# Patient Record
Sex: Female | Born: 1935 | Race: Black or African American | Hispanic: No | Smoking: Never smoker
Health system: Southern US, Community
[De-identification: ages and names within clinical notes are randomized; demographics above are authoritative.]

## PROBLEM LIST (undated history)

## (undated) DIAGNOSIS — Z79890 Hormone replacement therapy (postmenopausal): Secondary | ICD-10-CM

## (undated) DIAGNOSIS — I493 Ventricular premature depolarization: Secondary | ICD-10-CM

## (undated) DIAGNOSIS — K2210 Ulcer of esophagus without bleeding: Secondary | ICD-10-CM

## (undated) DIAGNOSIS — F329 Major depressive disorder, single episode, unspecified: Secondary | ICD-10-CM

## (undated) DIAGNOSIS — B029 Zoster without complications: Secondary | ICD-10-CM

## (undated) DIAGNOSIS — L309 Dermatitis, unspecified: Secondary | ICD-10-CM

## (undated) DIAGNOSIS — I10 Essential (primary) hypertension: Secondary | ICD-10-CM

## (undated) DIAGNOSIS — I2510 Atherosclerotic heart disease of native coronary artery without angina pectoris: Secondary | ICD-10-CM

## (undated) DIAGNOSIS — J189 Pneumonia, unspecified organism: Secondary | ICD-10-CM

## (undated) DIAGNOSIS — K5790 Diverticulosis of intestine, part unspecified, without perforation or abscess without bleeding: Secondary | ICD-10-CM

## (undated) DIAGNOSIS — R42 Dizziness and giddiness: Secondary | ICD-10-CM

## (undated) DIAGNOSIS — K922 Gastrointestinal hemorrhage, unspecified: Secondary | ICD-10-CM

## (undated) DIAGNOSIS — R739 Hyperglycemia, unspecified: Secondary | ICD-10-CM

## (undated) DIAGNOSIS — M5430 Sciatica, unspecified side: Secondary | ICD-10-CM

## (undated) DIAGNOSIS — M179 Osteoarthritis of knee, unspecified: Secondary | ICD-10-CM

## (undated) DIAGNOSIS — I272 Pulmonary hypertension, unspecified: Secondary | ICD-10-CM

## (undated) DIAGNOSIS — I509 Heart failure, unspecified: Secondary | ICD-10-CM

## (undated) DIAGNOSIS — M47816 Spondylosis without myelopathy or radiculopathy, lumbar region: Secondary | ICD-10-CM

## (undated) DIAGNOSIS — M1710 Unilateral primary osteoarthritis, unspecified knee: Secondary | ICD-10-CM

## (undated) DIAGNOSIS — E785 Hyperlipidemia, unspecified: Secondary | ICD-10-CM

## (undated) DIAGNOSIS — N183 Chronic kidney disease, stage 3 (moderate): Secondary | ICD-10-CM

## (undated) HISTORY — DX: Gastrointestinal hemorrhage, unspecified: K92.2

## (undated) HISTORY — DX: Hormone replacement therapy: Z79.890

## (undated) HISTORY — DX: Ventricular premature depolarization: I49.3

## (undated) HISTORY — DX: Chronic kidney disease, stage 3 (moderate): N18.3

## (undated) HISTORY — DX: Pulmonary hypertension, unspecified: I27.20

## (undated) HISTORY — DX: Spondylosis without myelopathy or radiculopathy, lumbar region: M47.816

## (undated) HISTORY — PX: BACK SURGERY: SHX140

## (undated) HISTORY — DX: Atherosclerotic heart disease of native coronary artery without angina pectoris: I25.10

## (undated) HISTORY — DX: Ulcer of esophagus without bleeding: K22.10

## (undated) HISTORY — DX: Pneumonia, unspecified organism: J18.9

## (undated) HISTORY — DX: Zoster without complications: B02.9

## (undated) HISTORY — DX: Heart failure, unspecified: I50.9

## (undated) HISTORY — DX: Diverticulosis of intestine, part unspecified, without perforation or abscess without bleeding: K57.90

## (undated) HISTORY — DX: Sciatica, unspecified side: M54.30

## (undated) HISTORY — DX: Dizziness and giddiness: R42

## (undated) HISTORY — PX: TOTAL ABDOMINAL HYSTERECTOMY: SHX209

## (undated) HISTORY — PX: APPENDECTOMY: SHX54

## (undated) HISTORY — DX: Dermatitis, unspecified: L30.9

## (undated) HISTORY — DX: Major depressive disorder, single episode, unspecified: F32.9

## (undated) HISTORY — DX: Hyperlipidemia, unspecified: E78.5

## (undated) HISTORY — DX: Hyperglycemia, unspecified: R73.9

## (undated) HISTORY — DX: Osteoarthritis of knee, unspecified: M17.9

## (undated) HISTORY — DX: Unilateral primary osteoarthritis, unspecified knee: M17.10

## (undated) HISTORY — PX: BREAST LUMPECTOMY: SHX2

---

## 2005-03-18 ENCOUNTER — Encounter: Admission: RE | Admit: 2005-03-18 | Discharge: 2005-03-18 | Payer: Self-pay | Admitting: Internal Medicine

## 2006-03-19 ENCOUNTER — Encounter: Admission: RE | Admit: 2006-03-19 | Discharge: 2006-03-19 | Payer: Self-pay | Admitting: Internal Medicine

## 2007-03-23 ENCOUNTER — Encounter: Admission: RE | Admit: 2007-03-23 | Discharge: 2007-03-23 | Payer: Self-pay | Admitting: Family Medicine

## 2007-08-16 ENCOUNTER — Ambulatory Visit: Payer: Self-pay | Admitting: Internal Medicine

## 2007-08-16 ENCOUNTER — Inpatient Hospital Stay (HOSPITAL_COMMUNITY): Admission: EM | Admit: 2007-08-16 | Discharge: 2007-08-21 | Payer: Self-pay | Admitting: Emergency Medicine

## 2008-03-25 ENCOUNTER — Encounter: Admission: RE | Admit: 2008-03-25 | Discharge: 2008-03-25 | Payer: Self-pay | Admitting: Internal Medicine

## 2008-08-18 ENCOUNTER — Encounter: Admission: RE | Admit: 2008-08-18 | Discharge: 2008-08-18 | Payer: Self-pay | Admitting: Internal Medicine

## 2008-08-24 ENCOUNTER — Encounter: Admission: RE | Admit: 2008-08-24 | Discharge: 2008-08-24 | Payer: Self-pay | Admitting: Internal Medicine

## 2008-10-18 ENCOUNTER — Emergency Department (HOSPITAL_COMMUNITY): Admission: EM | Admit: 2008-10-18 | Discharge: 2008-10-18 | Payer: Self-pay | Admitting: Emergency Medicine

## 2008-10-20 ENCOUNTER — Encounter: Admission: RE | Admit: 2008-10-20 | Discharge: 2008-10-20 | Payer: Self-pay | Admitting: Internal Medicine

## 2008-11-02 ENCOUNTER — Encounter: Admission: RE | Admit: 2008-11-02 | Discharge: 2008-11-02 | Payer: Self-pay | Admitting: Internal Medicine

## 2010-05-04 LAB — COMPREHENSIVE METABOLIC PANEL
AST: 24 U/L (ref 0–37)
Albumin: 3.9 g/dL (ref 3.5–5.2)
Alkaline Phosphatase: 68 U/L (ref 39–117)
CO2: 28 mEq/L (ref 19–32)
GFR calc Af Amer: >60 mL/min (ref >60)
GFR calc non Af Amer: 57 mL/min — ABNORMAL LOW (ref >60)
Potassium: 4.2 mEq/L (ref 3.5–5.1)
Sodium: 137 mEq/L (ref 135–145)
Total Bilirubin: 0.5 mg/dL (ref 0.3–1.2)

## 2010-05-04 LAB — URINALYSIS, ROUTINE W REFLEX MICROSCOPIC
Bilirubin Urine: NEGATIVE
Glucose, UA: NEGATIVE mg/dL
Hgb urine dipstick: NEGATIVE
Ketones, ur: NEGATIVE mg/dL
Nitrite: NEGATIVE
Specific Gravity, Urine: 1.016 (ref 1.005–1.030)
Urobilinogen, UA: 1 mg/dL (ref 0.0–1.0)

## 2010-05-04 LAB — LIPASE, BLOOD: Lipase: 23 U/L (ref 11–59)

## 2010-05-04 LAB — CBC
HCT: 43.7 % (ref 36.0–46.0)
Hemoglobin: 14.9 g/dL (ref 12.0–15.0)
MCV: 94.2 fL (ref 78.0–100.0)
Platelets: 203 10*3/uL (ref 150–400)
WBC: 7.6 10*3/uL (ref 4.0–10.5)

## 2010-05-04 LAB — DIFFERENTIAL
Basophils Absolute: 0.1 10*3/uL (ref 0.0–0.1)
Eosinophils Absolute: 0.1 10*3/uL (ref 0.0–0.7)
Eosinophils Relative: 1 % (ref 0–5)
Monocytes Relative: 7 % (ref 3–12)
Neutro Abs: 5.2 10*3/uL (ref 1.7–7.7)

## 2010-05-21 ENCOUNTER — Inpatient Hospital Stay (HOSPITAL_COMMUNITY)
Admission: EM | Admit: 2010-05-21 | Payer: Self-pay | Source: Other Acute Inpatient Hospital | Admitting: Internal Medicine

## 2010-05-21 ENCOUNTER — Emergency Department (HOSPITAL_COMMUNITY): Payer: Medicare Other

## 2010-05-21 ENCOUNTER — Inpatient Hospital Stay (HOSPITAL_COMMUNITY)
Admission: EM | Admit: 2010-05-21 | Discharge: 2010-05-23 | DRG: 378 | Disposition: A | Discharge status: Home / Self Care | Payer: Medicare Other | Attending: Internal Medicine | Admitting: Internal Medicine

## 2010-05-21 DIAGNOSIS — Z7982 Long term (current) use of aspirin: Secondary | ICD-10-CM

## 2010-05-21 DIAGNOSIS — F329 Major depressive disorder, single episode, unspecified: Secondary | ICD-10-CM | POA: Diagnosis present

## 2010-05-21 DIAGNOSIS — Z79899 Other long term (current) drug therapy: Secondary | ICD-10-CM

## 2010-05-21 DIAGNOSIS — K648 Other hemorrhoids: Secondary | ICD-10-CM | POA: Diagnosis present

## 2010-05-21 DIAGNOSIS — I509 Heart failure, unspecified: Secondary | ICD-10-CM | POA: Diagnosis present

## 2010-05-21 DIAGNOSIS — Z780 Asymptomatic menopausal state: Secondary | ICD-10-CM

## 2010-05-21 DIAGNOSIS — F3289 Other specified depressive episodes: Secondary | ICD-10-CM | POA: Diagnosis present

## 2010-05-21 DIAGNOSIS — K625 Hemorrhage of anus and rectum: Principal | ICD-10-CM | POA: Diagnosis present

## 2010-05-21 DIAGNOSIS — D500 Iron deficiency anemia secondary to blood loss (chronic): Secondary | ICD-10-CM | POA: Diagnosis present

## 2010-05-21 DIAGNOSIS — Z791 Long term (current) use of non-steroidal anti-inflammatories (NSAID): Secondary | ICD-10-CM

## 2010-05-21 DIAGNOSIS — K2210 Ulcer of esophagus without bleeding: Secondary | ICD-10-CM | POA: Diagnosis present

## 2010-05-21 DIAGNOSIS — M1990 Unspecified osteoarthritis, unspecified site: Secondary | ICD-10-CM | POA: Diagnosis present

## 2010-05-21 DIAGNOSIS — K5730 Diverticulosis of large intestine without perforation or abscess without bleeding: Secondary | ICD-10-CM | POA: Diagnosis present

## 2010-05-21 DIAGNOSIS — R42 Dizziness and giddiness: Secondary | ICD-10-CM | POA: Diagnosis present

## 2010-05-21 DIAGNOSIS — E876 Hypokalemia: Secondary | ICD-10-CM | POA: Diagnosis present

## 2010-05-21 DIAGNOSIS — I10 Essential (primary) hypertension: Secondary | ICD-10-CM | POA: Diagnosis present

## 2010-05-21 DIAGNOSIS — I4891 Unspecified atrial fibrillation: Secondary | ICD-10-CM | POA: Diagnosis present

## 2010-05-21 DIAGNOSIS — T39095A Adverse effect of salicylates, initial encounter: Secondary | ICD-10-CM | POA: Diagnosis present

## 2010-05-21 DIAGNOSIS — T3995XA Adverse effect of unspecified nonopioid analgesic, antipyretic and antirheumatic, initial encounter: Secondary | ICD-10-CM | POA: Diagnosis present

## 2010-05-21 LAB — CBC
HCT: 29.7 % — ABNORMAL LOW (ref 36.0–46.0)
MCH: 31.3 pg (ref 26.0–34.0)
MCHC: 33.3 g/dL (ref 30.0–36.0)
MCHC: 33.3 g/dL (ref 30.0–36.0)
Platelets: 172 10*3/uL (ref 150–400)
Platelets: 158 10*3/uL (ref 150–400)
Platelets: 163 10*3/uL (ref 150–400)
Platelets: 141 10*3/uL — ABNORMAL LOW (ref 150–400)
RBC: 3.57 MIL/uL — ABNORMAL LOW (ref 3.87–5.11)
RDW: 12.9 % (ref 11.5–15.5)
RDW: 12.9 % (ref 11.5–15.5)
RDW: 13 % (ref 11.5–15.5)
WBC: 10.4 10*3/uL (ref 4.0–10.5)
WBC: 7.1 10*3/uL (ref 4.0–10.5)
WBC: 5.6 10*3/uL (ref 4.0–10.5)

## 2010-05-21 LAB — BASIC METABOLIC PANEL
CO2: 25 mEq/L (ref 19–32)
Chloride: 108 mEq/L (ref 96–112)
GFR calc Af Amer: 59 mL/min — ABNORMAL LOW (ref >60)
Glucose, Bld: 124 mg/dL — ABNORMAL HIGH (ref 70–99)
Potassium: 3.8 mEq/L (ref 3.5–5.1)
Sodium: 141 mEq/L (ref 135–145)

## 2010-05-21 LAB — POCT CARDIAC MARKERS
CKMB, poc: <1 ng/mL — ABNORMAL LOW (ref 1.0–8.0)
Troponin i, poc: <0.05 ng/mL (ref 0.00–0.09)

## 2010-05-21 LAB — PROTIME-INR
INR: 1.05 (ref 0.00–1.49)
Prothrombin Time: 13.9 seconds (ref 11.6–15.2)

## 2010-05-21 LAB — SAMPLE TO BLOOD BANK

## 2010-05-21 LAB — APTT: aPTT: 47 seconds — ABNORMAL HIGH (ref 24–37)

## 2010-05-21 LAB — DIFFERENTIAL
Eosinophils Absolute: 0.1 10*3/uL (ref 0.0–0.7)
Eosinophils Relative: 1 % (ref 0–5)
Neutrophils Relative %: 71 % (ref 43–77)

## 2010-05-21 LAB — ABO/RH: ABO/RH(D): A NEG

## 2010-05-22 LAB — CBC
Hemoglobin: 9.9 g/dL — ABNORMAL LOW (ref 12.0–15.0)
Hemoglobin: 8.9 g/dL — ABNORMAL LOW (ref 12.0–15.0)
MCH: 32.1 pg (ref 26.0–34.0)
MCHC: 34.1 g/dL (ref 30.0–36.0)
MCV: 94.2 fL (ref 78.0–100.0)
Platelets: 160 10*3/uL (ref 150–400)
RBC: 3.18 MIL/uL — ABNORMAL LOW (ref 3.87–5.11)
RBC: 2.77 MIL/uL — ABNORMAL LOW (ref 3.87–5.11)

## 2010-05-22 LAB — COMPREHENSIVE METABOLIC PANEL
ALT: 9 U/L (ref 0–35)
AST: 17 U/L (ref 0–37)
Albumin: 2.8 g/dL — ABNORMAL LOW (ref 3.5–5.2)
CO2: 24 mEq/L (ref 19–32)
Calcium: 8 mg/dL — ABNORMAL LOW (ref 8.4–10.5)
Chloride: 112 mEq/L (ref 96–112)
GFR calc Af Amer: >60 mL/min (ref >60)
GFR calc non Af Amer: >60 mL/min (ref >60)
Sodium: 142 mEq/L (ref 135–145)
Total Bilirubin: 0.5 mg/dL (ref 0.3–1.2)

## 2010-05-23 LAB — CBC
HCT: 31.7 % — ABNORMAL LOW (ref 36.0–46.0)
MCHC: 33.1 g/dL (ref 30.0–36.0)
MCV: 94.9 fL (ref 78.0–100.0)
Platelets: 190 10*3/uL (ref 150–400)
RDW: 13 % (ref 11.5–15.5)
WBC: 7.5 10*3/uL (ref 4.0–10.5)

## 2010-05-23 NOTE — Discharge Summary (Signed)
[name], [name]            ACCOUNT NO.:  [id number]  MEDICAL RECORD NO.:  [id number]           PATIENT TYPE:  I  LOCATION:  1426                         FACILITY:  [facility]  PHYSICIAN:  Candyce Churn, M.D.DATE OF BIRTH:  1916/03/31  DATE OF ADMISSION:  05/21/2010 DATE OF DISCHARGE:  05/23/2010                              DISCHARGE SUMMARY   DISCHARGE DIAGNOSES: 1. Gastrointestinal bleed, likely lower gastrointestinal bleed, at     this time has resolved, felt to likely be either diverticular or     from internal hemorrhoids. 2. Upper endoscopy performed by Dr. Bernette Redbird on May 21, 2010,     reveals 3 small superficial antral ulcers consistent with aspirin     or non-steroidal anti-inflammatory drugs exposure.  There was a     small 4-cm hiatal hernia.  Not felt to be a source of     gastrointestinal bleed. 3. History of congestive heart failure, controlled. 4. History depression, controlled. 5. History of hypertension, controlled. 6. History of atrial fibrillation in the past. 7. Recurrent dizziness. 8. Hypokalemia.  ALLERGIES: 1. ACE INHIBITORS. 2. STREPTOMYCIN. 3. PENICILLIN.  CONSULTATIONS:  Gastroenterology, Bernette Redbird, M.D.  PROCEDURES:  Upper endoscopy performed on May 21, 2010, for hematochezia and results are as above.  DISCHARGE MEDICATIONS: 1. Tylenol 325 mg 2 every 6 hours as needed for joint pain. 2. Pantoprazole 40 mg daily for 15 days. 3. Tramadol 50 mg 1/2 tablet every 8 hours as needed for pain     unrelieved by Tylenol. 4. Meclizine 25 mg 1/2 tablet as needed for dizziness. 5. Citalopram 10 mg daily. 6. Diovan 80 mg daily. 7. Furosemide 40 mg daily and resume when small amount of ankle edema     is starting to reoccur. 8. Imdur 30 mg daily. 9. Loperamide 2 mg as needed for loose stools. 10.Potassium chloride 20 mEq daily. 11.Premarin 0.3 mg 1 tablet daily. 12.Prenatal vitamin daily. 13.Preparation H suppository one  rectally for 7 days.  DISCHARGE LABORATORY DATA:  White count 7500, hemoglobin 10.5, platelet count 190,000 on May 23, 2010.  Hemoglobin on May 22, 2010, was 8.9 and on May 21, 2010 was 9.67.  TSH on May 21, 2010, was 1.835 and comprehensive metabolic profile on May 22, 2010 reveals sodium 142, potassium 3.2, chloride 112, bicarb 24, glucose 84, BUN 10, creatinine 0.79.  LFTs within normal limits except for albumin slightly low at 2.8.  Chest x-ray performed on May 21, 2010, revealed minimal bibasilar atelectasis but otherwise benign.  HOSPITAL COURSE:  Ms. [name] is a very pleasant 77 year old female who developed hematochezia on the day of admission.  She had a sudden urgency to defecate while trying to get to the bathroom and had a large amount of blood rectally.  The bleeding lasted for about 20 minutes.  She was brought to the [facility] Emergency Room and there was no evidence of active bleeding there, but the stool was guaiac positive. She was admitted for observation.  Hemoglobin dropped to as low as 8.9 on the morning of May 22, 2010, but by May 23, 2010, hemoglobin was up to 10.5.  She  was seen in consultation by Dr. Bernette Redbird who performed an upper endoscopy and there were 2 small gastric erosions noted and a hiatal hernia and it was felt that it was probably lower GI bleed, either diverticular or hemorrhoidal.  On the day of discharge, the patient was no longer having bloody stools and hemoglobin was rising and vital signs were stable.  It was decided that she should be discharged home off aspirin therapy and off NSAIDs and to Tylenol or Ultram for her DJD.  I will plan to see her back in the office in 1 week for followup and for possible internal hemorrhoidal bleeding she will use preparation suppositories for 1 week.  Any recurrent bleeding, the patient should notify us immediately.  CONDITION ON DISCHARGE:  Much improved.  She will  be discharged home in the company of her family.  She is eating well and ambulating well.  Time spent on this discharge summary with examining the patient, reviewing the chart, performing discharge summary was 35 minutes.     Candyce Churn, M.D.     RNG/MEDQ  D:  05/23/2010  T:  05/23/2010  Job:  [id number]  Electronically Signed by Marden Noble M.D. on 05/23/2010 09:00:52 PM

## 2010-05-25 LAB — CROSSMATCH
Antibody Screen: NEGATIVE
Unit division: 0

## 2010-06-03 NOTE — H&P (Signed)
NAMESTEPHAINE, [name]            ACCOUNT NO.:  [id number]  MEDICAL RECORD NO.:  [id number]           PATIENT TYPE:  I  LOCATION:  1426                         FACILITY:  [facility]  PHYSICIAN:  Lonia Blood, M.D.      DATE OF BIRTH:  1916/07/31  DATE OF ADMISSION:  05/21/2010 DATE OF DISCHARGE:                             HISTORY & PHYSICAL   PRIMARY CARE PHYSICIAN:  Candyce Churn, M.D.  PRESENTING COMPLAINT:  Rectal bleed.  HISTORY OF PRESENT ILLNESS:  The patient is a 77 year old female with a history of atrial fibrillation and CHF who was relatively doing fine and remarkably well for her age.  She was apparently moving from her chair to the bathroom when she felt like she needed to really go and have a bowel movement.  Before she could get to the bathroom, she had what she described as a massive GI bleed.  She had blood oozing out from her rectum and she was unable to even walk.  She had to use a towel, which seemed to be soaked with blood.  This lasted for about 20 minutes, it was on and off.  She was dizzy and weak.  She finally was able to crawl to the telephone, call her nephew, and later call EMS.  EMS transported her to the emergency room.  On arrival, there was no evidence of active bleeding, but her stool guaiac was positive.  She is stable now.  She denied any abdominal pain.  No nausea or vomiting.  She has been taking 2 tablets of Aleve every day for the last 1 month.  She is also on aspirin.  Other than that, the patient has relatively been doing fine. No shortness of breath.  She is currently not dizzy.  She did not pass out.  PAST MEDICAL HISTORY: 1. CHF. 2. Depression. 3. Hypertension. 4. Atrial fibrillation. 5. Recurrent dizziness. 6. Hypokalemia.  ALLERGIES: 1. ACE INHIBITORS. 2. STREPTOMYCIN.  MEDICATIONS:  Aleve 2 tablets daily, aspirin, citalopram, isosorbide mononitrate, Lasix, loperamide, meclizine, metoprolol, potassium chloride, Premarin,  and prenatal vitamins.  SOCIAL HISTORY:  The patient lives in [place] at home.  She has some help, but she lives alone and she is doing fine with her ADLs.  No tobacco, alcohol, or IV drug use.  FAMILY HISTORY:  Noncontributory.  REVIEW OF SYSTEMS:  All systems reviewed are negative except per HPI.  PHYSICAL EXAMINATION:  VITAL SIGNS:  Temperature 97.5, blood pressure 137/78, pulse 55, respiratory rate 18, sats 95% on room air. GENERAL:  She is awake, alert, and oriented x3.  She is in no acute distress. HEENT:  PERL.  EOMI.  No pallor, no jaundice, no rhinorrhea. NECK:  Supple.  No JVD, no lymphadenopathy. RESPIRATORY:  She has good air entry bilaterally.  No wheezes, no rales, no crackles. CARDIOVASCULAR SYSTEM:  She has S1, S2.  No audible murmur. ABDOMEN:  Soft full, nontender with positive bowel sounds. EXTREMITIES:  No edema, cyanosis, or clubbing. SKIN:  No rashes, no ulcers.  LABORATORY DATA:  White count is 10.4, hemoglobin 11.2 with platelet count of 172 and normal differential.  Sodium 114, potassium 3.8,  chloride 108, CO2 is 25, glucose 124, BUN 20, creatinine 1.06, calcium 9.1.  Her GFR is, however, 49.  Fecal occult blood testing is positive.  Chest x-ray showed mildly hypoexpanded lungs with minimal left basilar atelectasis.  ASSESSMENT:  This is a 76 year old female presenting with rectal bleed. The rectal bleed was painless.  No prior history of GI bleed, but she is taking Aleve.  Her BUN is mildly elevated at 20, which may reflect some level of upper GI bleed, but the way she describes, it sounds more like a lower GI bleed, being that it was massive bleed.  PLAN: 1. GI bleed.  We will admit the patient to a monitored bed, serial     CBCs, IV Protonix.  I will put her on clear liquids.  I have gotten     a GI consult who are ready to see the patient in the morning.  We     will type and cross match her for 2 units of packed red blood cells     on hold if  her hemoglobin drops. 2. Hypertension.  I will hold her antihypertensives in view of  recent     GI bleed. 3. Depression.  We will continue her medication as soon as possible. 4. History of CHF.  Her EF is unknown at this point and I will defer     her care to her primary care physician. 5. History of atrial fibrillation.  She seems to be in sinus rhythm at     this point. 6. Anemia, mild anemia and normocytic, but this could be anemia of     blood loss.  Her hemoglobin is expected to probably drop in view of     the fact that she just had a GI bleed.     Lonia Blood, M.D.     Verlin Grills  D:  05/21/2010  T:  05/21/2010  Job:  [id number]  Electronically Signed by Lonia Blood M.D. on 06/03/2010 10:06:38 PM

## 2010-06-08 NOTE — Consult Note (Signed)
NAMEGRACIOUS, [name]            ACCOUNT NO.:  [id number]  MEDICAL RECORD NO.:  [id number]           PATIENT TYPE:  I  LOCATION:  1426                         [facility]ACILITY:  [facility]  PHYSICIAN:  Bernette Redbird, M.D.   DATE OF BIRTH:  1916/08/27  DATE OF CONSULTATION:  05/21/2010 DATE OF DISCHARGE:                                CONSULTATION   REQUESTING PHYSICIAN:  Lonia Blood, M.D.  HISTORY:  Dr. Lonia Blood of the [facility] asked [place] to see this 77 year old female because of hematochezia.  The patient has no prior history of GI bleeding.  She did have colonoscopy by Dr. Dorena Cookey about a year-and-a-half ago which showed moderately severe sigmoid and left-sided diverticulosis, but no polyps, inflammation, masses, or vascular ectasia.  Diverticula were also confirmed on an abdominopelvic CT scan around that time.  The patient is on daily aspirin and also has been using moderate amounts of nonsteroidal anti-inflammatory drugs.  With that background, the patient developed painless but profuse persistent hematochezia yesterday evening to the point of presyncope. She was brought to the hospital and admitted.  Since arriving here, she has had no further hematochezia.  Her hemoglobin has drifted down from 11.2 to 10.0.  For comparison, her hemoglobin 6 months ago was 13.9, as an outpatient.  PAST MEDICAL HISTORY:  Allergies to ACE INHIBITORS and STREPTOMYCIN.  OUTPATIENT MEDICATIONS:  Aleve 2 tablets daily, daily aspirin, citalopram, isosorbide mononitrate, Lasix, loperamide, meclizine, metoprolol, potassium chloride, Premarin and vitamins.  OPERATIONS:  None that I am aware of.  CHRONIC MEDICAL ILLNESSES:  Atrial fibrillation, CHF, hypertension, hypokalemia, depression.  HABITS:  Nonsmoker, nondrinker.  FAMILY HISTORY:  Not obtained, not felt relevant.  SOCIAL HISTORY:  The patient is independent and lives at home.  She is a retired [employer] information [employer] manager.  REVIEW  OF SYSTEMS:  Occasional diarrhea but otherwise no ongoing GI tract symptoms.  She does admit to significant fatigue or lack of energy at baseline.  PHYSICAL EXAMINATION:  GENERAL:  A very pleasant and remarkably well preserved female, in no acute distress.  Anicteric.  No frank pallor. SKIN:  Warm. CHEST:  Clear. HEART:  Slightly irregular rhythm, no murmurs. ABDOMEN:  Minimally adipose, active bowel sounds, no tenderness.  LABORATORY DATA:  White count 7100.  Hemoglobin on admission was 11.2 last night, this morning 10.0.  Platelets 158,000.  Differential count unremarkable.  INR 1.0, BUN 20 with creatinine 1.1.  Basic chemistry panel otherwise unremarkable.  Troponins negative.  Occult blood positive as expected.  TSH normal.  IMPRESSION:  Clinically, this is most compatible with moderately severe but self limited diverticular bleed, with associated posthemorrhagic anemia  of moderate severity.  PLAN:  I discussed various options with the patient including observation, endoscopic evaluation to take an upper tract source of the equation or colonoscopy.  In general, the diagnostic and therapeutic yield of colonoscopy in a patient like this with known diverticular disease and a fairly recent colonoscopy having been negative for polyps, would be quite low.  Therefore, I feel that the best course of action is to do an upper endoscopy to confirm the absence of an upper tract  source, since the patient does note that the stool was rather dark in color, since she was presyncopal, and since she has a history of aspirin and nonsteroidal medication exposure and is an elderly female which is the demographic category most likely to develop aspirin induced gastropathy and bleeding.  The nature, purpose, and risks of endoscopy were discussed with the patient and she is agreeable.  If the exam is negative, I would then favor observation and medical support as needed, for example, a transfusion if  her hemoglobin gets below 8.  I appreciate the opportunity to have seen this patient in consultation with you.          ______________________________ Bernette Redbird, M.D.     RB/MEDQ  D:  05/21/2010  T:  05/21/2010  Job:  [id number]  cc:   Candyce Churn, M.D. Fax: [phone]  Electronically Signed by Bernette Redbird M.D. on 06/08/2010 10:07:51 AM

## 2010-06-12 NOTE — Discharge Summary (Signed)
NAMEJENNETTA, [name]            ACCOUNT NO.:  [id number]   MEDICAL RECORD NO.:  [id number]          PATIENT TYPE:  INP   LOCATION:  4714                         FACILITY:  [facility]   PHYSICIAN:  Theressa Millard, M.D.    DATE OF BIRTH:  Nov 29, 1916   DATE OF ADMISSION:  08/15/2007  DATE OF DISCHARGE:  08/21/2007                               DISCHARGE SUMMARY   ADMITTING DIAGNOSIS:  Probable pneumonia.   DISCHARGE DIAGNOSES:  1. Pneumonia.  2. Volume overload, no evidence of congestive heart failure.  3. Hypertension.  4. Hyperlipidemia.  5. Coronary artery disease.  6. Hyperglycemia, resolved.   The patient is an 77 year old white female who presented to the  emergency room with a 3-4 day history of chest discomfort, general  malaise and fatigue, and a cough productive of yellow sputum.  She had  been exposed to her friend who had been sick with similar illness.   HOSPITAL COURSE:  The patient was admitted and was placed on Novel H1N1  on droplet precautions.  She was started on azithromycin and Rocephin.  Over the subsequent 24 hours, she stabilized and began to feel somewhat  better.  However, she continued to have cough that was usually dry  although occasionally productive.  A followup chest x-ray was consistent  with volume overload, and a BNP was mildly elevated.  I reviewed her old  chart, and she has no evidence of LV dysfunction.  It was thought that  the patient was probably suffering from mild volume overload, and she  was treated with furosemide intravenously on 2 occasions, and she felt  much better with resolution in her oxygen need as well as improvement in  her cough.  This was coincident with the treatment of her pneumonia and  the treatment of her developing volume overload.  She continued to  improve, was able to ambulate in the room without problem.  Her Novel  H1N1 test came back negative, and we allowed her to ambulate in the hall  which she could do easily.   She was changed to oral antibiotics, and she  was discharged in improved condition.   She did have mild hyperglycemia at admission as well as a transient  hypokalemia, both of which resolved at the time of discharge.   DISCHARGE MEDICATIONS:  1. Xanax 0.25 mg t.i.d. p.r.n.  2. Aspirin 81 mg daily.  3. Citalopram 10 mg daily.  4. Klonopin 0.125 mg at bedtime.  5. Diovan 40 mg daily.  6. Hydrochlorothiazide 25 mg daily.  7. Imdur 30 mg daily.  8. Potassium 20 mEq daily.  9. Pravastatin 20 mg daily.  10.Premarin 0.3 mg daily.  11.Multivitamin daily.  12.Aleve b.i.d. p.r.n.  13.Loperamide 2 mg p.r.n.  14.Meclizine 25 mg p.r.n.  15.Tussionex 1 teaspoon b.i.d. p.r.n.   ACTIVITY:  As tolerated.   DIET:  No-added salt.   FOLLOWUP:  She will call to make an appointment to see Dr. Kevan Ny in  approximately 3-4 weeks.  She will need a followup chest x-ray at that  time to be sure her mild pneumonic infiltrate has resolved.  Theressa Millard, M.D.  Electronically Signed     JO/MEDQ  D:  08/21/2007  T:  08/21/2007  Job:  [id number]

## 2010-06-12 NOTE — H&P (Signed)
[name], [name]            ACCOUNT NO.:  [id number]   MEDICAL RECORD NO.:  [id number]         PATIENT TYPE:  CINP   LOCATION:                                FACILITY:  [facility]   PHYSICIAN:  Donalynn Furlong, MD      DATE OF BIRTH:  1916-11-17   DATE OF ADMISSION:  08/16/2007  DATE OF DISCHARGE:                              HISTORY & PHYSICAL   PRIMARY CARE Irina Okelly:  [name], MD.   CHIEF COMPLAINT:  Shortness of breath, cough with productive sputum,  chest congestion, fever, chills and malaise.   HISTORY OF PRESENTING ILLNESS:  [name] is an 77 year old  female who lives in [place] by herself.  She presented to ER tonight  with a complaint of shortness of breath, chest pain with cough  production and sputum production since last Thursday.  She has the  symptoms for 3 days now.  She has associated chest pain which is sharp  in nature, increased with cough, deep breath and movement of chest.  Chest pain is located over the lower sternal area without any radiation.  She denied any history of heart attack or bypass surgery in the past.  She is followed by [facility].  She mentioned that she was  recently exposed to her friend who had been sick for 7-8 days and with  similar respiratory symptoms.  She mentioned that she started having  cough with mild productive yellow sputum on Thursday.  She was getting  short of breath on exertion, also associated with chest pain as above.  She also mentioned she was feeling hot and cold, she never checked her  temperature.  She felt hot all over the body which was not associated  with the chills or sweating.  She came to ER because progressively she  was getting weak and unable to do her daily activities.  She denied any  history of gallbladder problem, clots in the lungs, lower extremities,  history of pneumonia in the past.   PAST MEDICAL HISTORY:  1. Hypertension.  2. Hyperlipidemia.  3. Coronary artery disease.  4. Appendectomy.  5. Hysterectomy.   ALLERGIES:  1. ACE INHIBITORS.  2. STREPTOMYCIN.   HOME MEDICATIONS:  Aleve, alprazolam, aspirin, azithromycin, citalopram,  clonazepam, Diovan, hydrochlorothiazide, isosorbide mononitrate,  loperamide, meclizine Naproxen, potassium chloride, pravastatin,  Premarin, prenatal vitamin and Tussionex.   SOCIAL HISTORY:  She lives in [place] by herself, occasional cocktail  drinker.  No recent smoking or drug use.   FAMILY HISTORY:  Nothing contributory to the current problem.   REVIEW OF SYSTEMS:  Positive per HPI, otherwise negative review of  systems done for 14 systems.   PHYSICAL EXAM:  VITAL SIGNS:  Blood pressure 146/71, pulse 71,  respirations 18, temperature 99.1, oxygen saturation 98% on room air.  GENERAL EXAM:  Alert, oriented x3, laying in bed.  Appears stated age.  CARDIOVASCULAR:  S1, S2 regular.  No murmur, rub, gallop.  LUNGS:  Bilateral lower lobe crackles posteriorly along with wheezing  all over the lungs, good bilateral air entry.  ABDOMEN:  Previous scars noted.  Mild tender  all over the abdomen,  nondistended.  Bowel sounds present.  No organomegaly.  EXTREMITIES:  No clubbing, cyanosis, edema.  Pulse peripheral in all  four extremities.  HEAD:  Normocephalic, nontraumatic.  EYES:  Pupils equally reactive to light and accommodation.  Extraocular  muscles intact.  ORAL CAVITY:  Oral mucosa dry, no thrush noted.  NECK:  No thyromegaly or JVD.  NEUROLOGICAL EXAM:  Shows intact cranial nerves, muscular strength,  sensation.  SKIN:  No rash, no bruits.   LAB:  Shows CBC with differential normal.  First set of troponins  negative.  Comprehensive metabolic panel unremarkable except sodium 134,  glucose 101 and calcium ionized 1.08.  Chest x-ray shows increased  interstitial markings and patchy density at the lung bases, suggestive  of bronchitis and patchy basilar pneumonia.  EKG shows normal sinus  rhythm with left axis  deviation, no acute ST-T changes.   ASSESSMENT AND PLAN:  1. Shortness of breath, cough, chest congestion, chest pain, fever,      chills, malaise with a chest infiltrate on the chest x-ray      suggestive of community-acquired pneumonia, influenza cannot be      excluded, coronary artery disease cannot be excluded.  2. History of hypertension.  3. History of hyperlipidemia.  4. History of occasional alcohol use.   PLAN:  Will admit patient to telemetry bed, under Dr. [name],  with a diagnosis of chest pain, pneumonia.  Will start her on cardiac  diet.  We will check her vitals q.4h. along with input, output.  Will  get CBC, CMP, magnesium phosphate, EKG, chest x-ray in the morning.  Will get cardiac enzymes with troponins at 2:00 a.m. and 8:00 a.m.  today.  Will start breathing treatment with albuterol/ipratropium q.6h.  and p.r.n.  We will check H1N1 virus, PCR surveillance test and start  appropriate isolation now.  Patient has already had symptoms for 3 days  so she does not qualify for the treatment for Tamiflu at this time.  Will start IV Rocephin 1 gram and IV azithromycin 500 mg every 24 hours.  Will put SCDs on the leg.  Will give her aspirin 81 mg, Xanax,  citalopram, clonazepam, Diovan, isosorbide mononitrate, pravastatin and  Premarin at home dose.      Donalynn Furlong, MD  Electronically Signed     TVP/MEDQ  D:  08/16/2007  T:  08/16/2007  Job:  [id number]   cc:   Candyce Churn, M.D.

## 2010-10-26 LAB — DIFFERENTIAL
Basophils Absolute: 0
Basophils Relative: 0
Lymphocytes Relative: 14
Monocytes Absolute: 0.6
Neutro Abs: 7.8 — ABNORMAL HIGH
Neutrophils Relative %: 79 — ABNORMAL HIGH

## 2010-10-26 LAB — PHOSPHORUS: Phosphorus: 2.2 — ABNORMAL LOW

## 2010-10-26 LAB — CBC
HCT: 34.5 — ABNORMAL LOW
HCT: 35.2 — ABNORMAL LOW
HCT: 35.3 — ABNORMAL LOW
Hemoglobin: 11.8 — ABNORMAL LOW
Hemoglobin: 11.9 — ABNORMAL LOW
Hemoglobin: 12.4
Hemoglobin: 12.8
MCHC: 34.1
MCV: 91.9
Platelets: 169
Platelets: 186
RBC: 3.78 — ABNORMAL LOW
RDW: 14.1
RDW: 14.2
RDW: 14.3
WBC: 15.1 — ABNORMAL HIGH

## 2010-10-26 LAB — BASIC METABOLIC PANEL
BUN: 11
BUN: 9
CO2: 24
CO2: 28
CO2: 28
CO2: 29
Calcium: 8.6
Calcium: 9
Calcium: 9
Chloride: 101
Chloride: 96
Chloride: 97
GFR calc Af Amer: >60
GFR calc Af Amer: >60
GFR calc Af Amer: >60
GFR calc non Af Amer: >60
GFR calc non Af Amer: >60
Glucose, Bld: 112 — ABNORMAL HIGH
Glucose, Bld: 115 — ABNORMAL HIGH
Glucose, Bld: 87
Glucose, Bld: 96
Potassium: 3.4 — ABNORMAL LOW
Potassium: 3.5
Sodium: 134 — ABNORMAL LOW
Sodium: 136
Sodium: 139

## 2010-10-26 LAB — POCT CARDIAC MARKERS
CKMB, poc: 1.3
Myoglobin, poc: 65.1
Operator id: 133351
Troponin i, poc: <0.05

## 2010-10-26 LAB — POCT I-STAT, CHEM 8
Calcium, Ion: 1.08 — ABNORMAL LOW
Chloride: 101
Glucose, Bld: 101 — ABNORMAL HIGH
HCT: 40
TCO2: 26

## 2010-10-26 LAB — CULTURE, BLOOD (ROUTINE X 2): Culture: NO GROWTH

## 2010-10-26 LAB — TROPONIN I: Troponin I: 0.05

## 2010-10-26 LAB — URINE CULTURE
Colony Count: NO GROWTH
Culture: NO GROWTH

## 2010-10-26 LAB — CARDIAC PANEL(CRET KIN+CKTOT+MB+TROPI)
CK, MB: 3.5
Total CK: 143

## 2010-10-26 LAB — COMPREHENSIVE METABOLIC PANEL
Alkaline Phosphatase: 64
BUN: 7
Chloride: 99
Creatinine, Ser: 0.96
Glucose, Bld: 220 — ABNORMAL HIGH
Potassium: 3.7
Total Bilirubin: 0.6
Total Protein: 6.4

## 2010-10-26 LAB — CULTURE, RESPIRATORY W GRAM STAIN

## 2010-10-26 LAB — URINALYSIS, ROUTINE W REFLEX MICROSCOPIC
Leukocytes, UA: NEGATIVE
Nitrite: NEGATIVE
Protein, ur: 30 — AB
Specific Gravity, Urine: 1.015
Urobilinogen, UA: 0.2

## 2010-10-26 LAB — URINE MICROSCOPIC-ADD ON

## 2010-10-26 LAB — CK TOTAL AND CKMB (NOT AT ARMC): Total CK: 128

## 2010-10-26 LAB — EXPECTORATED SPUTUM ASSESSMENT W GRAM STAIN, RFLX TO RESP C

## 2010-10-26 LAB — MAGNESIUM: Magnesium: 1.5

## 2010-12-04 ENCOUNTER — Other Ambulatory Visit: Payer: Self-pay | Admitting: Internal Medicine

## 2010-12-04 ENCOUNTER — Ambulatory Visit
Admission: RE | Admit: 2010-12-04 | Discharge: 2010-12-04 | Disposition: A | Discharge status: Home / Self Care | Payer: Medicare Other | Source: Ambulatory Visit | Attending: Internal Medicine | Admitting: Internal Medicine

## 2010-12-04 DIAGNOSIS — R0601 Orthopnea: Secondary | ICD-10-CM

## 2010-12-04 DIAGNOSIS — R05 Cough: Secondary | ICD-10-CM

## 2010-12-25 ENCOUNTER — Encounter: Payer: Self-pay | Admitting: Pulmonary Disease

## 2010-12-26 ENCOUNTER — Encounter: Payer: Self-pay | Admitting: Pulmonary Disease

## 2010-12-26 ENCOUNTER — Ambulatory Visit (INDEPENDENT_AMBULATORY_CARE_PROVIDER_SITE_OTHER): Payer: Medicare Other | Admitting: Pulmonary Disease

## 2010-12-26 VITALS — BP 122/68 | HR 65 | Temp 98.0°F | Ht 64.0 in | Wt 156.8 lb

## 2010-12-26 DIAGNOSIS — R05 Cough: Secondary | ICD-10-CM

## 2010-12-26 DIAGNOSIS — R053 Chronic cough: Secondary | ICD-10-CM

## 2010-12-26 NOTE — Patient Instructions (Addendum)
Stop fexofenadine, and get chlorpheniramine 8mg  over the counter and take each night at bedtime. Take your acid reflux medication in the am, then take nexium samples before dinner everyday Minimize voice use, and no throat clearing Use hard candy, no mint or menthol, to soothe your throat during the day. Please call us in 3 weeks to give [place] update on your condition.

## 2010-12-26 NOTE — Progress Notes (Signed)
  Subjective:    Patient ID: [name], female    DOB: [birth year], 77 y.o.   MRN: [id number]  HPI The patient is a 77 year old female who I've been asked to see for a chronic cough.  Patient states she was in her usual state of health until September of this year when she began to develop a cough.  Associated with this she had chest congestion as well as moderate quantity of clear mucus.  She was seen by her primary physician and felt to have an acute bronchitis, and was treated with antibiotics and prednisone.  She did feel better, however her cough continued to be an issue.  She has been treated with Allegra and a nasal spray for possible allergy symptoms, as well his Advair for possible asthma with no improvement.  Her cough has been dry after the initial episode, and the patient feels this is coming from her throat.  She denies chronic throat clearing, but has had voice changes.  She can lose her voice at times.  She denies any postnasal drip, sinus pressure, or sinus congestion.  She does have a history of reflux for which she is on once a day proton pump inhibitor, but has breakthrough symptoms despite this at times.  She has no history of asthma or other lung disease, and a recent chest x-ray was unremarkable.  She does state that her cough is worse with eating and drinking, but does not feel that she aspirates.   Review of Systems  Constitutional: Negative for fever and unexpected weight change.  HENT: Positive for congestion, sore throat, sneezing and trouble swallowing. Negative for ear pain, nosebleeds, rhinorrhea, dental problem, postnasal drip and sinus pressure.   Eyes: Negative for redness and itching.  Respiratory: Positive for cough and shortness of breath. Negative for chest tightness and wheezing.   Cardiovascular: Positive for chest pain and palpitations. Negative for leg swelling.  Gastrointestinal: Negative for nausea and vomiting.  Genitourinary: Negative for dysuria.    Musculoskeletal: Positive for joint swelling.  Skin: Negative for rash.  Neurological: Negative for headaches.  Hematological: Does not bruise/bleed easily.  Psychiatric/Behavioral: Negative for dysphoric mood. The patient is not nervous/anxious.        Objective:   Physical Exam Constitutional:  Well developed, no acute distress  HENT:  Nares patent without discharge, mildly erythematous mucosa  Oropharynx without exudate, palate and uvula are normal  Eyes:  Perrla, eomi, no scleral icterus  Neck:  No JVD, no TMG  Cardiovascular:  Normal rate, regular rhythm, no rubs or gallops.  No murmurs        Intact distal pulses  Pulmonary :  Normal breath sounds, no stridor or respiratory distress   No rales, rhonchi, or wheezing  Abdominal:  Soft, nondistended, bowel sounds present.  No tenderness noted.   Musculoskeletal:  No lower extremity edema noted.  Lymph Nodes:  No cervical lymphadenopathy noted  Skin:  No cyanosis noted  Neurologic:  Alert, appropriate, moves all 4 extremities without obvious deficit.         Assessment & Plan:

## 2010-12-26 NOTE — Assessment & Plan Note (Signed)
The patient has a persistent cough since September of this year, after what sounds like an episode of acute bronchitis.  From her description, her cough is now dry and most likely coming from her upper airway.  Possible etiologies include postnasal drip, laryngopharyngeal reflux, and a cyclical cough mechanism.  Given her worsening cough with eating and drinking, as well as breakthrough reflux symptoms at times, I would like to treat her more aggressively for possible laryngopharyngeal reflux.  I would also like to empirically treat her with a potent antihistamine at bedtime.  I have also discussed with her the various behavioral treatments for upper airway cough, including voice rest, no throat clearing, and hard candy to soothe the upper airway.

## 2011-01-17 ENCOUNTER — Telehealth: Payer: Self-pay | Admitting: Pulmonary Disease

## 2011-01-17 NOTE — Telephone Encounter (Signed)
LMOM for pt TCB 

## 2011-01-17 NOTE — Telephone Encounter (Signed)
Pt called back.  Stated she was calling to give update per her last OV with KC on 12/26/10.  Pt states her cough is 90% improved!!! She is continuing to take Chlorpheniramine 8mg  qhs.  Pt states she is no longer 'spitting up mucus.'  Pt has ran out of the Nexium samples but has continued to take her Protonix qam.  Pt states she continues to rest her voice as much as she can, suck on hard candy and not clear her throat. Pt aware KC out of office this PM and is ok to wait for a response when [facility] returns to office tomorrow.  KC, please advise.  Thanks.

## 2011-01-18 NOTE — Telephone Encounter (Signed)
Pt aware of KC instructions and verbalized directions and understanding.

## 2011-01-18 NOTE — Telephone Encounter (Signed)
Let her know to stay on the chlorpheniramine at bedtime for 2 more weeks, then can take as needed. Also, continue her protonix, but take over the counter prilosec 20mg  before dinner for just another 2 weeks, then go back to protonix alone.  followup with me as needed.,

## 2011-02-28 DIAGNOSIS — Z0000 Encounter for general adult medical examination without abnormal findings: Secondary | ICD-10-CM | POA: Diagnosis not present

## 2011-02-28 DIAGNOSIS — F411 Generalized anxiety disorder: Secondary | ICD-10-CM | POA: Diagnosis not present

## 2011-02-28 DIAGNOSIS — E7800 Pure hypercholesterolemia, unspecified: Secondary | ICD-10-CM | POA: Diagnosis not present

## 2011-02-28 DIAGNOSIS — I2510 Atherosclerotic heart disease of native coronary artery without angina pectoris: Secondary | ICD-10-CM | POA: Diagnosis not present

## 2011-02-28 DIAGNOSIS — I10 Essential (primary) hypertension: Secondary | ICD-10-CM | POA: Diagnosis not present

## 2011-02-28 DIAGNOSIS — IMO0002 Reserved for concepts with insufficient information to code with codable children: Secondary | ICD-10-CM | POA: Diagnosis not present

## 2011-02-28 DIAGNOSIS — Z79899 Other long term (current) drug therapy: Secondary | ICD-10-CM | POA: Diagnosis not present

## 2011-02-28 DIAGNOSIS — R197 Diarrhea, unspecified: Secondary | ICD-10-CM | POA: Diagnosis not present

## 2011-03-06 DIAGNOSIS — N390 Urinary tract infection, site not specified: Secondary | ICD-10-CM | POA: Diagnosis not present

## 2011-03-06 DIAGNOSIS — L272 Dermatitis due to ingested food: Secondary | ICD-10-CM | POA: Diagnosis not present

## 2011-03-25 DIAGNOSIS — L272 Dermatitis due to ingested food: Secondary | ICD-10-CM | POA: Diagnosis not present

## 2011-03-25 DIAGNOSIS — R351 Nocturia: Secondary | ICD-10-CM | POA: Diagnosis not present

## 2011-03-25 DIAGNOSIS — N390 Urinary tract infection, site not specified: Secondary | ICD-10-CM | POA: Diagnosis not present

## 2011-04-01 DIAGNOSIS — H2640 Unspecified secondary cataract: Secondary | ICD-10-CM | POA: Diagnosis not present

## 2011-04-01 DIAGNOSIS — H524 Presbyopia: Secondary | ICD-10-CM | POA: Diagnosis not present

## 2011-04-01 DIAGNOSIS — H5200 Hypermetropia, unspecified eye: Secondary | ICD-10-CM | POA: Diagnosis not present

## 2011-04-01 DIAGNOSIS — H52229 Regular astigmatism, unspecified eye: Secondary | ICD-10-CM | POA: Diagnosis not present

## 2011-04-13 ENCOUNTER — Emergency Department (HOSPITAL_COMMUNITY): Payer: Medicare Other

## 2011-04-13 ENCOUNTER — Emergency Department (HOSPITAL_COMMUNITY)
Admission: EM | Admit: 2011-04-13 | Discharge: 2011-04-14 | Disposition: A | Discharge status: Home / Self Care | Payer: Medicare Other | Attending: Emergency Medicine | Admitting: Emergency Medicine

## 2011-04-13 ENCOUNTER — Encounter (HOSPITAL_COMMUNITY): Payer: Self-pay | Admitting: Emergency Medicine

## 2011-04-13 DIAGNOSIS — I509 Heart failure, unspecified: Secondary | ICD-10-CM | POA: Insufficient documentation

## 2011-04-13 DIAGNOSIS — I2510 Atherosclerotic heart disease of native coronary artery without angina pectoris: Secondary | ICD-10-CM | POA: Insufficient documentation

## 2011-04-13 DIAGNOSIS — M542 Cervicalgia: Secondary | ICD-10-CM | POA: Diagnosis not present

## 2011-04-13 DIAGNOSIS — Z79899 Other long term (current) drug therapy: Secondary | ICD-10-CM | POA: Insufficient documentation

## 2011-04-13 DIAGNOSIS — S0990XA Unspecified injury of head, initial encounter: Secondary | ICD-10-CM

## 2011-04-13 DIAGNOSIS — S0100XA Unspecified open wound of scalp, initial encounter: Secondary | ICD-10-CM | POA: Insufficient documentation

## 2011-04-13 DIAGNOSIS — Y92009 Unspecified place in unspecified non-institutional (private) residence as the place of occurrence of the external cause: Secondary | ICD-10-CM | POA: Insufficient documentation

## 2011-04-13 DIAGNOSIS — M5030 Other cervical disc degeneration, unspecified cervical region: Secondary | ICD-10-CM | POA: Insufficient documentation

## 2011-04-13 DIAGNOSIS — E785 Hyperlipidemia, unspecified: Secondary | ICD-10-CM | POA: Insufficient documentation

## 2011-04-13 DIAGNOSIS — W108XXA Fall (on) (from) other stairs and steps, initial encounter: Secondary | ICD-10-CM | POA: Insufficient documentation

## 2011-04-13 DIAGNOSIS — S0101XA Laceration without foreign body of scalp, initial encounter: Secondary | ICD-10-CM

## 2011-04-13 DIAGNOSIS — W19XXXA Unspecified fall, initial encounter: Secondary | ICD-10-CM

## 2011-04-13 MED ORDER — TETANUS-DIPHTH-ACELL PERTUSSIS 5-2.5-18.5 LF-MCG/0.5 IM SUSP
0.5000 mL | Freq: Once | INTRAMUSCULAR | Status: AC
  Administered 2011-04-13: 0.5 mL via INTRAMUSCULAR
  Filled 2011-04-13: qty 0.5

## 2011-04-13 NOTE — ED Notes (Signed)
Patient transported to CT 

## 2011-04-13 NOTE — ED Notes (Signed)
Pt has a wound to the top of her head, pt states she slipped on a stair landing hitting her head on the door. No LOC, currently has no dizziness or nausea. Pt is A&O x4

## 2011-04-13 NOTE — ED Notes (Signed)
MD/NP at bedside.

## 2011-04-14 NOTE — Discharge Instructions (Signed)
Please review the instructions below. Your seen tonight following a fall at home. You had a minor head injury and laceration to her scalp. The CT of your brain showed no acute injury. The CT of you cervical spine showed lots of age-related change but no acute injury. There were some findings in your lower cervical spine not related to this injury that will need to be followed up by your primary care physician. Be sure you informed him that your T-Dap was updated here in the emergency department. You will need to make arrangements to follow up with your primary care physician or the [facility] facility for staple removal in 7 days. Return if you have any concerns regarding wound infection or other worrisome symptoms, otherwise followup as discussed.     Head Injury, Adult You have had a head injury that does not appear serious at this time. A concussion is a state of changed mental ability, usually from a blow to the head. You should take clear liquids for the rest of the day and then resume your regular diet. You should not take sedatives or alcoholic beverages for as long as directed by your caregiver after discharge. After injuries such as yours, most problems occur within the first 24 hours. SYMPTOMS These minor symptoms may be experienced after discharge:  Memory difficulties.   Dizziness.   Headaches.   Double vision.   Hearing difficulties.   Depression.   Tiredness.   Weakness.   Difficulty with concentration.  If you experience any of these problems, you should not be alarmed. A concussion requires a few days for recovery. Many patients with head injuries frequently experience such symptoms. Usually, these problems disappear without medical care. If symptoms last for more than one day, notify your caregiver. See your caregiver sooner if symptoms are becoming worse rather than better. HOME CARE INSTRUCTIONS   During the next 24 hours you must stay with someone who can  watch you for the warning signs listed below.  Although it is unlikely that serious side effects will occur, you should be aware of signs and symptoms which may necessitate your return to this location. Side effects may occur up to 7 - 10 days following the injury. It is important for you to carefully monitor your condition and contact your caregiver or seek immediate medical attention if there is a change in your condition. SEEK IMMEDIATE MEDICAL CARE IF:   There is confusion or drowsiness.   You can not awaken the injured person.   There is nausea (feeling sick to your stomach) or continued, forceful vomiting.   You notice dizziness or unsteadiness which is getting worse, or inability to walk.   You have convulsions or unconsciousness.   You experience severe, persistent headaches not relieved by over-the-counter or prescription medicines for pain. (Do not take aspirin as this impairs clotting abilities). Take other pain medications only as directed.   You can not use arms or legs normally.   There is clear or bloody discharge from the nose or ears.  MAKE SURE YOU:   Understand these instructions.   Will watch your condition.   Will get help right away if you are not doing well or get worse.  Document Released: 01/14/2005 Document Revised: 01/03/2011 Document Reviewed: 12/02/2008 [facility] Patient Information 2012 [place], [place].Marland KitchenStaple Wound Closure Your wound has been cleaned and closed with skin staples. HOME CARE  Keep the area around the staples clean and dry.   Rest and raise (elevate)  the injured part above the level of your heart.   See your doctor for a follow-up check of the wound.   See your doctor to have the staples removed.   As the wound heals, you may leave it open to the air and clean it daily with water.   Do not soak the wound in water for long periods of time.   Watch for signs of a wound infection:   Unusual redness or puffiness around the wound.     Increasing pain or tenderness.   Yellowish white fluid (pus) coming from the wound.  You may need a tetanus shot if:  You cannot remember when you had your last tetanus shot.   You have never had a tetanus shot.   The injury broke your skin.  If you need a tetanus shot and you choose not to have one, you may get tetanus. Sickness from tetanus can be serious. GET HELP RIGHT AWAY IF:   You think the wound is infected.   The wound does not stay together after the staples have been taken out.   Something comes out of the wound, such as wood or glass.   You or your child has problems moving the injured area.   You or your child has a temperature by mouth above 102 F (38.9 C), not controlled by medicine.  MAKE SURE YOU:   Understand these instructions.   Will watch this condition.   Will get help right away if you or your child is not doing well or gets worse.  Document Released: 10/24/2007 Document Revised: 01/03/2011 Document Reviewed: 01/13/2008 [facility] Patient Information 2012 [place], [place].

## 2011-04-14 NOTE — ED Provider Notes (Signed)
History     CSN: [id number]  Arrival date & time 04/13/11  2119   First MD Initiated Contact with Patient 04/13/11 2203      Chief Complaint  Patient presents with  . Head Injury  . Head Laceration    (Consider location/radiation/quality/duration/timing/severity/associated sxs/prior treatment) Patient is a 77 y.o. female presenting with fall. The history is provided by the patient.  Fall The accident occurred 1 to 2 hours ago. The fall occurred while walking. She fell from a height of 3 to 5 ft. She landed on concrete. The volume of blood lost was minimal. The point of impact was the head. The pain is at a severity of 5/10. The pain is moderate. She was ambulatory at the scene. There was no entrapment after the fall. There was no drug use involved in the accident. There was no alcohol use involved in the accident. Pertinent negatives include no visual change, no numbness, no nausea, no vomiting and no loss of consciousness. She has tried nothing for the symptoms.  Pt reports she fell attempting to go up stairs to enter her home. States it was raining and wet on the porch and she slipped. States her head hit th corner of the outside door has she fell. Denies LOC. Now w/ laceration to (R) scalp area.Denies other c/o's.  Past Medical History  Diagnosis Date  . Hyperglycemia   . CAD (coronary artery disease)   . Pneumonia     july 2009  . Hyperlipidemia   . Hormone replacement therapy (postmenopausal)   . Diverticulosis   . DJD (degenerative joint disease) of lumbar spine     scoliosis  . DJD (degenerative joint disease) of knee   . Sciatica     right lower extremity  . Allergic rhinitis   . Reactive depression (situational)   . Shingles     right thoracic 2008  . Eczema   . GI bleed   . Diverticular disease   . Esophageal erosions     from nsaid   . Cough   . Orthopnea   . Vertigo   . CHF (congestive heart failure)     Past Surgical History  Procedure Date  . Back  surgery   . Total abdominal hysterectomy   . Breast lumpectomy   . Appendectomy     Family History  Problem Relation Age of Onset  . Heart disease Daughter   . Heart disease Son   . Heart disease Father   . Heart disease Mother   . Cancer Brother   . Heart disease Sister     History  Substance Use Topics  . Smoking status: Never Smoker   . Smokeless tobacco: Never Used  . Alcohol Use: Yes    OB History    Grav Para Term Preterm Abortions TAB SAB Ect Mult Living                  Review of Systems  Constitutional: Negative.   HENT: Negative.   Eyes: Negative.   Respiratory: Negative.   Cardiovascular: Negative.   Gastrointestinal: Negative.  Negative for nausea and vomiting.  Genitourinary: Negative.   Musculoskeletal: Negative.   Skin: Negative.   Neurological: Negative.  Negative for loss of consciousness and numbness.  Hematological: Negative.   Psychiatric/Behavioral: Negative.     Allergies  Ace inhibitors; Penicillins; Streptomycin; and Tramadol  Home Medications   Current Outpatient Rx  Name Route Sig Dispense Refill  . ASPIRIN 81 MG PO TABS Oral  Take 81 mg by mouth daily.      Marland Kitchen CITALOPRAM HYDROBROMIDE 40 MG PO TABS Oral Take 40 mg by mouth daily.      Marland Kitchen ESTROGENS CONJUGATED 0.3 MG PO TABS Oral Take 0.3 mg by mouth daily. Take daily for 21 days then do not take for 7 days.     Marland Kitchen FEXOFENADINE HCL 180 MG PO TABS Oral Take 180 mg by mouth daily.     . FUROSEMIDE 40 MG PO TABS  Take 80mg  on Mondays, Wednesdays and Fridays and then 40mg  on Tuesdays, Thursdays, Saturdays and Sundays.    . ISOSORBIDE MONONITRATE ER 30 MG PO TB24 Oral Take 30 mg by mouth daily.      Marland Kitchen LOPERAMIDE HCL 2 MG PO TABS Oral Take 2 mg by mouth. 1 capsule up to 8 tablets per 24hour 8 times a day     . MECLIZINE HCL 25 MG PO TABS Oral Take 25 mg by mouth 2 (two) times daily as needed.     Marland Kitchen METOPROLOL SUCCINATE ER 25 MG PO TB24 Oral Take 25 mg by mouth 2 (two) times daily.      Marland Kitchen  PANTOPRAZOLE SODIUM 40 MG PO TBEC Oral Take 40 mg by mouth every other day.     Marland Kitchen POTASSIUM CHLORIDE 10 MEQ PO TBCR Oral Take 10 mEq by mouth 2 (two) times daily.      Marland Kitchen PRENATAL 27-0.8 MG PO TABS Oral Take 1 tablet by mouth daily.        BP 134/110  Pulse 77  Temp(Src) 98.2 F (36.8 C) (Oral)  Resp 16  SpO2 95%  Physical Exam  Constitutional: She is oriented to person, place, and time. She appears well-developed and well-nourished.  HENT:  Head: Normocephalic and atraumatic.    Eyes: Conjunctivae and EOM are normal. Pupils are equal, round, and reactive to light.  Neck: Full passive range of motion without pain. Neck supple. No spinous process tenderness and no muscular tenderness present.  Cardiovascular: Normal rate and regular rhythm.   Pulmonary/Chest: Effort normal and breath sounds normal.  Abdominal: Soft. Bowel sounds are normal. There is no tenderness.  Musculoskeletal: Normal range of motion.       Thoracic back: She exhibits no bony tenderness.       Lumbar back: She exhibits no bony tenderness.       No obvious signs of trauma to back. Pelvis stable, no hip TTP bil. BLE ROM w/o pain, no other objective signs of trauma.  Neurological: She is alert and oriented to person, place, and time.  Skin: Skin is warm and dry. No erythema.  Psychiatric: She has a normal mood and affect.    ED Course  LACERATION REPAIR Date/Time: 04/14/2011 11:50 PM Performed by: Leanne Chang Authorized by: Leanne Chang Consent: Verbal consent obtained. Risks and benefits: risks, benefits and alternatives were discussed Consent given by: patient Patient understanding: patient states understanding of the procedure being performed Required items: required blood products, implants, devices, and special equipment available Patient identity confirmed: verbally with patient Body area: head/neck Location details: scalp Laceration length: 4 cm Foreign bodies: no foreign  bodies Tendon involvement: none Nerve involvement: none Vascular damage: no Anesthesia: local infiltration Local anesthetic: lidocaine 2% without epinephrine Anesthetic total: 7 ml Patient sedated: no Preparation: Patient was prepped and draped in the usual sterile fashion. Irrigation solution: saline Irrigation method: syringe Amount of cleaning: standard Debridement: none Degree of undermining: none Skin closure: staples Number of sutures:  5 Approximation: close Approximation difficulty: simple Dressing: antibiotic ointment Patient tolerance: Patient tolerated the procedure well with no immediate complications.   Findings and clinical impression discussed w/ pt. Will plan for d/c home w/ head injury precautions and encourage patient to follow up with her primary care physician or the [facility] for staple removal in 7 days. Patient agreeable with plan.  Labs Reviewed - No data to display Ct Head Wo Contrast  04/13/2011  *RADIOLOGY REPORT*  Clinical Data:  Fall with head laceration and neck pain.  CT HEAD WITHOUT CONTRAST CT CERVICAL SPINE WITHOUT CONTRAST  Technique:  Multidetector CT imaging of the head and cervical spine was performed following the standard protocol without intravenous contrast.  Multiplanar CT image reconstructions of the cervical spine were also generated.  Comparison:   None  CT HEAD  Findings: Age-related volume loss.  Ventricle size is normal. Negative for hemorrhage, hydrocephalus, mass effect, mass lesion, or evidence of acute cortically based infarction.  Lens extraction of both orbits noted.  The visualized paranasal sinuses, mastoid air cells, and middle ears are clear.  The skull is intact.  No focal scalp swelling is seen.  There is a laceration, with some subcutaneous gas in the anterolateral right scalp near the vertex.  IMPRESSION:  1.  Right scalp laceration. 2.  No acute intracranial abnormality.  CT CERVICAL SPINE  Findings: Cervical  spine is aligned from the skull base through the T3 vertebral body.  There is significant multilevel degenerative disc disease.  There is disc height narrowing most prominent at C5- 6 and C6-7, where there is prominent anterior and posterior osteophyte formation.  There is approximately 3 mm anterolisthesis of C3 on C4, likely related to extensive facet joint degenerative changes in the upper cervical spine.  The remainder of the vertebral bodies are normally aligned. No acute cervical spine fracture is identified.  There is several scattered lucencies within the vertebral bodies, particularly C6 and C7.  These levels are affected by significant degenerative change.  These can be degenerative in nature.  The possibility of metastatic disease or myeloma cannot be excluded, but correlation with the patient's history is suggested.  The prevertebral soft tissue contour is normal.  The lung apices are aerated.  IMPRESSION:  1.  No acute bony abnormality cervical spine.  2.  Extensive degenerative disc disease and facet joint degenerative change. 3. Several focal lucent areas within the lower cervical spine vertebral bodies.  These are favored to be due to degenerative change.  If the patient has a history of known malignancy, metastatic lesions cannot be completely excluded.  Original Report Authenticated By: Britta Mccreedy, M.D.   Ct Cervical Spine Wo Contrast  04/13/2011  *RADIOLOGY REPORT*  Clinical Data:  Fall with head laceration and neck pain.  CT HEAD WITHOUT CONTRAST CT CERVICAL SPINE WITHOUT CONTRAST  Technique:  Multidetector CT imaging of the head and cervical spine was performed following the standard protocol without intravenous contrast.  Multiplanar CT image reconstructions of the cervical spine were also generated.  Comparison:   None  CT HEAD  Findings: Age-related volume loss.  Ventricle size is normal. Negative for hemorrhage, hydrocephalus, mass effect, mass lesion, or evidence of acute cortically  based infarction.  Lens extraction of both orbits noted.  The visualized paranasal sinuses, mastoid air cells, and middle ears are clear.  The skull is intact.  No focal scalp swelling is seen.  There is a laceration, with some subcutaneous gas in the anterolateral right  scalp near the vertex.  IMPRESSION:  1.  Right scalp laceration. 2.  No acute intracranial abnormality.  CT CERVICAL SPINE  Findings: Cervical spine is aligned from the skull base through the T3 vertebral body.  There is significant multilevel degenerative disc disease.  There is disc height narrowing most prominent at C5- 6 and C6-7, where there is prominent anterior and posterior osteophyte formation.  There is approximately 3 mm anterolisthesis of C3 on C4, likely related to extensive facet joint degenerative changes in the upper cervical spine.  The remainder of the vertebral bodies are normally aligned. No acute cervical spine fracture is identified.  There is several scattered lucencies within the vertebral bodies, particularly C6 and C7.  These levels are affected by significant degenerative change.  These can be degenerative in nature.  The possibility of metastatic disease or myeloma cannot be excluded, but correlation with the patient's history is suggested.  The prevertebral soft tissue contour is normal.  The lung apices are aerated.  IMPRESSION:  1.  No acute bony abnormality cervical spine.  2.  Extensive degenerative disc disease and facet joint degenerative change. 3. Several focal lucent areas within the lower cervical spine vertebral bodies.  These are favored to be due to degenerative change.  If the patient has a history of known malignancy, metastatic lesions cannot be completely excluded.  Original Report Authenticated By: Britta Mccreedy, M.D.     No diagnosis found.    MDM  HPI/PE and clinical findings c/w 1. Mechanical fall at home (Walking up wet stairs) 2. Minor Head injury (as result of #1, no loc, ct head and  neck w/o acute findings, no focal neurological findings) 3. (R) scalp laceration (Closed w/ 6 staples)        Roma Kayser Elena Cothern, NP 04/14/11 0028

## 2011-04-14 NOTE — ED Provider Notes (Signed)
Medical screening examination/treatment/procedure(s) were conducted as a shared visit with non-physician practitioner(s) and myself.  I personally evaluated the patient during the encounter S/p fall, scalp lac. Pt alert, nad. No other c/o. Spine nt.   Suzi Roots, MD 04/14/11 [phone]

## 2011-04-22 DIAGNOSIS — R351 Nocturia: Secondary | ICD-10-CM | POA: Diagnosis not present

## 2011-04-29 DIAGNOSIS — IMO0002 Reserved for concepts with insufficient information to code with codable children: Secondary | ICD-10-CM | POA: Diagnosis not present

## 2011-05-07 DIAGNOSIS — M48061 Spinal stenosis, lumbar region without neurogenic claudication: Secondary | ICD-10-CM | POA: Diagnosis not present

## 2011-05-07 DIAGNOSIS — IMO0002 Reserved for concepts with insufficient information to code with codable children: Secondary | ICD-10-CM | POA: Diagnosis not present

## 2011-05-14 DIAGNOSIS — R0602 Shortness of breath: Secondary | ICD-10-CM | POA: Diagnosis not present

## 2011-05-14 DIAGNOSIS — R9430 Abnormal result of cardiovascular function study, unspecified: Secondary | ICD-10-CM | POA: Diagnosis not present

## 2011-05-14 DIAGNOSIS — I10 Essential (primary) hypertension: Secondary | ICD-10-CM | POA: Diagnosis not present

## 2011-05-17 DIAGNOSIS — Z961 Presence of intraocular lens: Secondary | ICD-10-CM | POA: Diagnosis not present

## 2011-05-17 DIAGNOSIS — H26499 Other secondary cataract, unspecified eye: Secondary | ICD-10-CM | POA: Diagnosis not present

## 2011-05-24 DIAGNOSIS — H524 Presbyopia: Secondary | ICD-10-CM | POA: Diagnosis not present

## 2011-05-24 DIAGNOSIS — H52229 Regular astigmatism, unspecified eye: Secondary | ICD-10-CM | POA: Diagnosis not present

## 2011-05-24 DIAGNOSIS — H5200 Hypermetropia, unspecified eye: Secondary | ICD-10-CM | POA: Diagnosis not present

## 2011-05-24 DIAGNOSIS — Z961 Presence of intraocular lens: Secondary | ICD-10-CM | POA: Diagnosis not present

## 2011-07-31 DIAGNOSIS — IMO0002 Reserved for concepts with insufficient information to code with codable children: Secondary | ICD-10-CM | POA: Diagnosis not present

## 2011-07-31 DIAGNOSIS — M48061 Spinal stenosis, lumbar region without neurogenic claudication: Secondary | ICD-10-CM | POA: Diagnosis not present

## 2011-08-13 DIAGNOSIS — M79609 Pain in unspecified limb: Secondary | ICD-10-CM | POA: Diagnosis not present

## 2011-11-06 DIAGNOSIS — Z23 Encounter for immunization: Secondary | ICD-10-CM | POA: Diagnosis not present

## 2011-11-13 DIAGNOSIS — IMO0002 Reserved for concepts with insufficient information to code with codable children: Secondary | ICD-10-CM | POA: Diagnosis not present

## 2011-11-13 DIAGNOSIS — M48061 Spinal stenosis, lumbar region without neurogenic claudication: Secondary | ICD-10-CM | POA: Diagnosis not present

## 2012-01-07 DIAGNOSIS — R0602 Shortness of breath: Secondary | ICD-10-CM | POA: Diagnosis not present

## 2012-01-07 DIAGNOSIS — R9430 Abnormal result of cardiovascular function study, unspecified: Secondary | ICD-10-CM | POA: Diagnosis not present

## 2012-01-07 DIAGNOSIS — I10 Essential (primary) hypertension: Secondary | ICD-10-CM | POA: Diagnosis not present

## 2012-01-13 DIAGNOSIS — K219 Gastro-esophageal reflux disease without esophagitis: Secondary | ICD-10-CM | POA: Diagnosis not present

## 2012-01-13 DIAGNOSIS — K921 Melena: Secondary | ICD-10-CM | POA: Diagnosis not present

## 2012-01-13 DIAGNOSIS — I10 Essential (primary) hypertension: Secondary | ICD-10-CM | POA: Diagnosis not present

## 2012-01-13 DIAGNOSIS — I2510 Atherosclerotic heart disease of native coronary artery without angina pectoris: Secondary | ICD-10-CM | POA: Diagnosis not present

## 2012-01-13 DIAGNOSIS — R5382 Chronic fatigue, unspecified: Secondary | ICD-10-CM | POA: Diagnosis not present

## 2012-01-13 DIAGNOSIS — R351 Nocturia: Secondary | ICD-10-CM | POA: Diagnosis not present

## 2012-03-04 DIAGNOSIS — I10 Essential (primary) hypertension: Secondary | ICD-10-CM | POA: Diagnosis not present

## 2012-03-04 DIAGNOSIS — R5382 Chronic fatigue, unspecified: Secondary | ICD-10-CM | POA: Diagnosis not present

## 2012-03-04 DIAGNOSIS — K921 Melena: Secondary | ICD-10-CM | POA: Diagnosis not present

## 2012-03-04 DIAGNOSIS — Z0000 Encounter for general adult medical examination without abnormal findings: Secondary | ICD-10-CM | POA: Diagnosis not present

## 2012-03-04 DIAGNOSIS — Z79899 Other long term (current) drug therapy: Secondary | ICD-10-CM | POA: Diagnosis not present

## 2012-03-04 DIAGNOSIS — Z1331 Encounter for screening for depression: Secondary | ICD-10-CM | POA: Diagnosis not present

## 2012-03-04 DIAGNOSIS — R351 Nocturia: Secondary | ICD-10-CM | POA: Diagnosis not present

## 2012-03-04 DIAGNOSIS — I2510 Atherosclerotic heart disease of native coronary artery without angina pectoris: Secondary | ICD-10-CM | POA: Diagnosis not present

## 2012-03-04 DIAGNOSIS — K219 Gastro-esophageal reflux disease without esophagitis: Secondary | ICD-10-CM | POA: Diagnosis not present

## 2012-03-06 DIAGNOSIS — I10 Essential (primary) hypertension: Secondary | ICD-10-CM | POA: Diagnosis not present

## 2012-03-10 DIAGNOSIS — N390 Urinary tract infection, site not specified: Secondary | ICD-10-CM | POA: Diagnosis not present

## 2012-03-31 DIAGNOSIS — N390 Urinary tract infection, site not specified: Secondary | ICD-10-CM | POA: Diagnosis not present

## 2012-06-02 DIAGNOSIS — M48061 Spinal stenosis, lumbar region without neurogenic claudication: Secondary | ICD-10-CM | POA: Diagnosis not present

## 2012-06-02 DIAGNOSIS — M5137 Other intervertebral disc degeneration, lumbosacral region: Secondary | ICD-10-CM | POA: Diagnosis not present

## 2012-06-02 DIAGNOSIS — IMO0002 Reserved for concepts with insufficient information to code with codable children: Secondary | ICD-10-CM | POA: Diagnosis not present

## 2012-06-03 DIAGNOSIS — I10 Essential (primary) hypertension: Secondary | ICD-10-CM | POA: Diagnosis not present

## 2012-06-03 DIAGNOSIS — M48061 Spinal stenosis, lumbar region without neurogenic claudication: Secondary | ICD-10-CM | POA: Diagnosis not present

## 2012-06-03 DIAGNOSIS — R197 Diarrhea, unspecified: Secondary | ICD-10-CM | POA: Diagnosis not present

## 2012-06-03 DIAGNOSIS — IMO0002 Reserved for concepts with insufficient information to code with codable children: Secondary | ICD-10-CM | POA: Diagnosis not present

## 2012-06-03 DIAGNOSIS — M5137 Other intervertebral disc degeneration, lumbosacral region: Secondary | ICD-10-CM | POA: Diagnosis not present

## 2012-07-06 DIAGNOSIS — I10 Essential (primary) hypertension: Secondary | ICD-10-CM | POA: Diagnosis not present

## 2012-07-06 DIAGNOSIS — R0602 Shortness of breath: Secondary | ICD-10-CM | POA: Diagnosis not present

## 2012-07-06 DIAGNOSIS — R197 Diarrhea, unspecified: Secondary | ICD-10-CM | POA: Diagnosis not present

## 2012-07-06 DIAGNOSIS — R9430 Abnormal result of cardiovascular function study, unspecified: Secondary | ICD-10-CM | POA: Diagnosis not present

## 2012-07-06 DIAGNOSIS — R350 Frequency of micturition: Secondary | ICD-10-CM | POA: Diagnosis not present

## 2012-07-06 DIAGNOSIS — B353 Tinea pedis: Secondary | ICD-10-CM | POA: Diagnosis not present

## 2012-08-17 DIAGNOSIS — I10 Essential (primary) hypertension: Secondary | ICD-10-CM | POA: Diagnosis not present

## 2012-08-17 DIAGNOSIS — L84 Corns and callosities: Secondary | ICD-10-CM | POA: Diagnosis not present

## 2012-08-17 DIAGNOSIS — R197 Diarrhea, unspecified: Secondary | ICD-10-CM | POA: Diagnosis not present

## 2012-08-17 DIAGNOSIS — B353 Tinea pedis: Secondary | ICD-10-CM | POA: Diagnosis not present

## 2012-08-17 DIAGNOSIS — R350 Frequency of micturition: Secondary | ICD-10-CM | POA: Diagnosis not present

## 2012-08-17 DIAGNOSIS — E876 Hypokalemia: Secondary | ICD-10-CM | POA: Diagnosis not present

## 2012-10-14 DIAGNOSIS — M48061 Spinal stenosis, lumbar region without neurogenic claudication: Secondary | ICD-10-CM | POA: Diagnosis not present

## 2012-10-14 DIAGNOSIS — IMO0002 Reserved for concepts with insufficient information to code with codable children: Secondary | ICD-10-CM | POA: Diagnosis not present

## 2012-10-21 DIAGNOSIS — Z23 Encounter for immunization: Secondary | ICD-10-CM | POA: Diagnosis not present

## 2012-10-26 DIAGNOSIS — M2040 Other hammer toe(s) (acquired), unspecified foot: Secondary | ICD-10-CM | POA: Diagnosis not present

## 2012-10-26 DIAGNOSIS — L84 Corns and callosities: Secondary | ICD-10-CM | POA: Diagnosis not present

## 2012-10-26 DIAGNOSIS — M2010 Hallux valgus (acquired), unspecified foot: Secondary | ICD-10-CM | POA: Diagnosis not present

## 2012-10-26 DIAGNOSIS — B351 Tinea unguium: Secondary | ICD-10-CM | POA: Diagnosis not present

## 2012-11-03 DIAGNOSIS — H524 Presbyopia: Secondary | ICD-10-CM | POA: Diagnosis not present

## 2012-11-03 DIAGNOSIS — H5200 Hypermetropia, unspecified eye: Secondary | ICD-10-CM | POA: Diagnosis not present

## 2012-11-03 DIAGNOSIS — H26499 Other secondary cataract, unspecified eye: Secondary | ICD-10-CM | POA: Diagnosis not present

## 2012-11-03 DIAGNOSIS — H52229 Regular astigmatism, unspecified eye: Secondary | ICD-10-CM | POA: Diagnosis not present

## 2012-11-25 DIAGNOSIS — I10 Essential (primary) hypertension: Secondary | ICD-10-CM | POA: Diagnosis not present

## 2012-11-25 DIAGNOSIS — J069 Acute upper respiratory infection, unspecified: Secondary | ICD-10-CM | POA: Diagnosis not present

## 2012-12-28 ENCOUNTER — Encounter: Payer: Self-pay | Admitting: Podiatry

## 2012-12-28 ENCOUNTER — Ambulatory Visit (INDEPENDENT_AMBULATORY_CARE_PROVIDER_SITE_OTHER): Payer: Medicare Other

## 2012-12-28 ENCOUNTER — Ambulatory Visit (INDEPENDENT_AMBULATORY_CARE_PROVIDER_SITE_OTHER): Payer: Medicare Other | Admitting: Podiatry

## 2012-12-28 VITALS — BP 166/82 | HR 66 | Resp 16

## 2012-12-28 DIAGNOSIS — B351 Tinea unguium: Secondary | ICD-10-CM | POA: Diagnosis not present

## 2012-12-28 DIAGNOSIS — L84 Corns and callosities: Secondary | ICD-10-CM

## 2012-12-28 DIAGNOSIS — M79609 Pain in unspecified limb: Secondary | ICD-10-CM

## 2012-12-29 ENCOUNTER — Telehealth: Payer: Self-pay | Admitting: *Deleted

## 2012-12-29 NOTE — Telephone Encounter (Signed)
Pt complains of pain after toenail procedure on 12/28/2012, unable to sleep.  I spoke with the pt, she describes the pain as throbbing.  I instructed pt to remove the dressing and begin Epsom Salt 1/4 C in 1 qt warm water for 20 minutes once or twice a day for 3 to 4 weeks, apply Neosporin Ointment after every soak.  I asked pt what medicine she could take for a headache, she states she take Tylenol for pain.  I told her that should help.  Pt agreed.

## 2012-12-29 NOTE — Progress Notes (Signed)
Subjective:     Patient ID: [name], female   DOB: 1916-04-08, 79 y.o.   MRN: [id number]  HPI patient presents with caregiver stating a lot of problems between my big toe in my second toe on my right foot and nail disease 1-5 both feet that become painful. States she has tried to pad the area without relief and has had surgery years ago on this area   Review of Systems     Objective:   Physical Exam Neurovascular status unchanged and keratotic lesion inner side second toe in abutment against the big toe with significant discomfort when pressed    Assessment:     Lesion secondary to structural abnormality of the toe and nail disease with pain 1-5 both feet    Plan:     Debrided the lesion which bled quite a bit secondary to the skin being very thin with padding applied by me and dressing with antiseptic. Debridement nailbeds 1-5 both feet with no iatrogenic bleeding noted and instructed on soaking the right toe and if any swelling drainage were to occur to let us know

## 2012-12-31 ENCOUNTER — Encounter: Payer: Self-pay | Admitting: Podiatry

## 2012-12-31 ENCOUNTER — Ambulatory Visit (INDEPENDENT_AMBULATORY_CARE_PROVIDER_SITE_OTHER): Payer: Medicare Other | Admitting: Podiatry

## 2012-12-31 VITALS — BP 159/87 | HR 68 | Resp 16

## 2012-12-31 DIAGNOSIS — L03039 Cellulitis of unspecified toe: Secondary | ICD-10-CM | POA: Diagnosis not present

## 2012-12-31 DIAGNOSIS — L02619 Cutaneous abscess of unspecified foot: Secondary | ICD-10-CM

## 2012-12-31 MED ORDER — HYDROCODONE-ACETAMINOPHEN 10-325 MG PO TABS: 1.0000 | ORAL_TABLET | Freq: Three times a day (TID) | ORAL | Status: DC | PRN

## 2012-12-31 MED ORDER — CEPHALEXIN 500 MG PO CAPS: 500.0000 mg | ORAL_CAPSULE | Freq: Two times a day (BID) | ORAL | Status: DC

## 2012-12-31 NOTE — Progress Notes (Signed)
Subjective:     Patient ID: [name], female   DOB: December 17, 1916, 79 y.o.   MRN: [id number]  HPI patient presents stating the second toe has been giving me trouble and is developed blisters and I was worried about. States she had trouble sleeping several nights ago and the pain as not as bad but still sore   Review of Systems     Objective:   Physical Exam Neurovascular status was intact with no change in mental status. Patient's right second toe has blisters in the proximal phalanx on both the medial and lateral side in the area where a trim the corn is healing well but there is redness within the toe itself. No proximal edema erythema or lymph node distention    Assessment:     Abscess of the right second toe with vascular trauma to the digit itself    Plan:     Explained to patient and caregiver it is possible she may lose this toe but at this time I drained the abscesses flush the area and applied a dressing with Iodosorb around the irritated area. Placed on cephalexin 500 mg twice a day and Vicodin for pain and gave instructions on Epsom salts soaks and open toed shoes reappoint her recheck in one week

## 2013-01-08 ENCOUNTER — Ambulatory Visit (INDEPENDENT_AMBULATORY_CARE_PROVIDER_SITE_OTHER): Payer: Medicare Other | Admitting: Interventional Cardiology

## 2013-01-08 ENCOUNTER — Encounter: Payer: Self-pay | Admitting: Interventional Cardiology

## 2013-01-08 VITALS — BP 164/88 | HR 75 | Ht 66.0 in | Wt 157.0 lb

## 2013-01-08 DIAGNOSIS — K921 Melena: Secondary | ICD-10-CM | POA: Diagnosis not present

## 2013-01-08 DIAGNOSIS — I10 Essential (primary) hypertension: Secondary | ICD-10-CM | POA: Diagnosis not present

## 2013-01-08 DIAGNOSIS — R9430 Abnormal result of cardiovascular function study, unspecified: Secondary | ICD-10-CM | POA: Insufficient documentation

## 2013-01-08 DIAGNOSIS — R0602 Shortness of breath: Secondary | ICD-10-CM

## 2013-01-08 DIAGNOSIS — R0609 Other forms of dyspnea: Secondary | ICD-10-CM | POA: Insufficient documentation

## 2013-01-08 DIAGNOSIS — K625 Hemorrhage of anus and rectum: Secondary | ICD-10-CM

## 2013-01-08 LAB — CBC
Hemoglobin: 13.1 g/dL (ref 12.0–15.0)
MCHC: 33.7 g/dL (ref 30.0–36.0)
MCV: 94.2 fl (ref 78.0–100.0)
Platelets: 197 10*3/uL (ref 150.0–400.0)
RBC: 4.14 Mil/uL (ref 3.87–5.11)

## 2013-01-08 MED ORDER — ISOSORBIDE MONONITRATE ER 30 MG PO TB24: 30.0000 mg | ORAL_TABLET | Freq: Every day | ORAL | Status: DC

## 2013-01-08 MED ORDER — AMLODIPINE BESYLATE 5 MG PO TABS: 5.0000 mg | ORAL_TABLET | Freq: Every day | ORAL | Status: DC

## 2013-01-08 NOTE — Patient Instructions (Signed)
Your physician wants you to follow-up in: 6 Months with Dr. Eldridge Dace. You will receive a reminder letter in the mail two months in advance. If you don't receive a letter, please call our office to schedule the follow-up appointment.  Your physician has recommended you make the following change in your medication:   1. Start Amlodipine 5 mg 1 tablet by mouth Daily. Written rx given to you today.  Written rx for Imdur given to you today as well.  Your physician recommends that you return for lab work today for CBC.

## 2013-01-08 NOTE — Progress Notes (Signed)
Patient ID: [name], female   DOB: 07/19/1916, 79 y.o.   MRN: [id number]    [address] [place], [place]  [address] Phone: [phone] Fax:  [phone]  Date:  01/08/2013   ID:  [name], DOB 1916-08-23, MRN [id number]  PCP:  Morgan Dubonnet, MD      History of Present Illness: [name] is a 79 y.o. female who had an abnormal stress test several years ago. SHe has DOE when she walks fast. Walking is limited by back pain and recent for procedure.. She has significant bruising in her right foot, second toe.  No chest pain with activity. She has some left breast pain with lying down, after eating and to palpation. She feels tired. She feels that the Lasix 40 mg makes her urinate too much. She takes it every other day. She does not walk much. Housework is the main exercise, but this has been limited due to her foot problem. Not walking daily.  She is taking Lasix every other day now. SHe does not urinate as much.    Wt Readings from Last 3 Encounters:  01/08/13 157 lb (71.215 kg)  12/26/10 156 lb 12.8 oz (71.124 kg)     Past Medical History  Diagnosis Date  . Hyperglycemia   . CAD (coronary artery disease)   . Pneumonia     july 2009  . Hyperlipidemia   . Hormone replacement therapy (postmenopausal)   . Diverticulosis   . DJD (degenerative joint disease) of lumbar spine     scoliosis  . DJD (degenerative joint disease) of knee   . Sciatica     right lower extremity  . Allergic rhinitis   . Reactive depression (situational)   . Shingles     right thoracic 2008  . Eczema   . GI bleed   . Diverticular disease   . Esophageal erosions     from nsaid   . Cough   . Orthopnea   . Vertigo   . CHF (congestive heart failure)     Current Outpatient Prescriptions  Medication Sig Dispense Refill  . aspirin 81 MG tablet Take 81 mg by mouth daily.        . citalopram (CELEXA) 40 MG tablet Take 40 mg by mouth daily.        Marland Kitchen  estrogens, conjugated, (PREMARIN) 0.3 MG tablet Take 0.3 mg by mouth daily. Take daily for 21 days then do not take for 7 days.       . fexofenadine (ALLEGRA) 180 MG tablet Take 180 mg by mouth daily.       . furosemide (LASIX) 40 MG tablet Take 80mg  on Mondays, Wednesdays and Fridays and then 40mg  on Tuesdays, Thursdays, Saturdays and Sundays.      Marland Kitchen HYDROcodone-acetaminophen (NORCO) 10-325 MG per tablet Take 1 tablet by mouth every 8 (eight) hours as needed.  20 tablet  0  . isosorbide mononitrate (IMDUR) 30 MG 24 hr tablet Take 30 mg by mouth daily.        Marland Kitchen loperamide (AF-LOPERAMIDE HCL) 2 MG tablet Take 2 mg by mouth. 1 capsule up to 8 tablets per 24hour 8 times a day       . meclizine (ANTIVERT) 25 MG tablet Take 25 mg by mouth 2 (two) times daily as needed.       . metoprolol succinate (TOPROL-XL) 25 MG 24 hr tablet Take 25 mg by mouth 2 (two) times daily.        Marland Kitchen  potassium chloride (KLOR-CON) 10 MEQ CR tablet Take 10 mEq by mouth 2 (two) times daily.        . Prenatal Vit-Fe Fumarate-FA (MULTIVITAMIN-PRENATAL) 27-0.8 MG TABS Take 1 tablet by mouth daily.         No current facility-administered medications for this visit.    Allergies:    Allergies  Allergen Reactions  . Ace Inhibitors   . Penicillins     Pruritic rash  . Streptomycin     Pruritus   . Tramadol     Social History:  The patient  reports that she has never smoked. She has never used smokeless tobacco. She reports that she drinks alcohol. She reports that she does not use illicit drugs.   Family History:  The patient's family history includes Cancer in her brother; Heart disease in her daughter, father, mother, sister, and son.   ROS:  Please see the history of present illness.  No nausea, vomiting.  No fevers, chills.  No focal weakness.  No dysuria. Several days of BRBPR  All other systems reviewed and negative.   PHYSICAL EXAM: VS:  BP 164/88  Pulse 75  Ht 5\' 6"  (1.676 m)  Wt 157 lb (71.215 kg)  BMI 25.35  kg/m2 Well nourished, well developed, in no acute distress HEENT: normal Neck: no JVD, no carotid bruits Cardiac:  normal S1, S2; RRR; 2/6 systolic murmur; left chest painful to palpation a lower rib cage, costochondral junction Lungs:  clear to auscultation bilaterally, no wheezing, rhonchi or rales Abd: soft, nontender, no hepatomegaly Ext: no edema; swollen bruised 2nd toe on right foot Skin: warm and dry Neuro:   no focal abnormalities noted       ASSESSMENT AND PLAN:  Nonspecific abnormal unspecified cardiovascular function study  Continue Isosorbide Mononitrate Tablet Extended Release 24 Hour, 30 MG, 1 tablet, Orally, Once a day Notes: No angina. Atypical chest pain under the left breast that is atypical for CAD.  This may be costochondritis. I do not want to give her any more NSAIDs due to her rectal bleeding. Mild lateral wall defect on stress test several years ago.  2. Essential hypertension, benign  Continue Metoprolol Succinate Tablet Extended Release 24 Hour, 25 MG, one tablet, Orally, b.i.d. Notes: Elevated athome. Start amlodipine 5 mg daily. Check BP at home 3. Shortness of breath  Notes: Stable shortness of breath. Can Increase to lasix 40 mg daily. Can repeat this if she feels extra fluid or feels swollen or Shortness of breath.  Likely multifactorial form deconditioning and age along with diastolic dysfunction. Eats bananas to replace potassium. Hopefully can increase walking when toe is better.   4. Others  IMAGING: EKG   [name] 07/06/2012 10:28:45 AM > [name],JAY 07/06/2012 11:44:45 AM > NSR, PRWP, LAD, NSST  Continue wound care for her right toe. If this is not improved in the next few days, she should followup with either her podiatrist for Dr. Kevan [name]. She thinks she maybe be seeing a different podiatrist in the future. 5. rectal bleeding: Will check CBC. She has had this in the past and was diagnosed with diverticulosis. It seems to have stopped on its  own.   Signed, Morgan Mare, MD, [facility] 01/08/2013 10:49 AM

## 2013-01-13 DIAGNOSIS — H8110 Benign paroxysmal vertigo, unspecified ear: Secondary | ICD-10-CM | POA: Diagnosis not present

## 2013-01-13 DIAGNOSIS — E876 Hypokalemia: Secondary | ICD-10-CM | POA: Diagnosis not present

## 2013-01-13 DIAGNOSIS — I10 Essential (primary) hypertension: Secondary | ICD-10-CM | POA: Diagnosis not present

## 2013-01-13 DIAGNOSIS — N951 Menopausal and female climacteric states: Secondary | ICD-10-CM | POA: Diagnosis not present

## 2013-01-13 DIAGNOSIS — I2510 Atherosclerotic heart disease of native coronary artery without angina pectoris: Secondary | ICD-10-CM | POA: Diagnosis not present

## 2013-01-13 DIAGNOSIS — F411 Generalized anxiety disorder: Secondary | ICD-10-CM | POA: Diagnosis not present

## 2013-01-13 DIAGNOSIS — R197 Diarrhea, unspecified: Secondary | ICD-10-CM | POA: Diagnosis not present

## 2013-01-13 DIAGNOSIS — R350 Frequency of micturition: Secondary | ICD-10-CM | POA: Diagnosis not present

## 2013-01-18 ENCOUNTER — Ambulatory Visit (INDEPENDENT_AMBULATORY_CARE_PROVIDER_SITE_OTHER): Payer: Medicare Other | Admitting: Podiatry

## 2013-01-18 ENCOUNTER — Encounter: Payer: Self-pay | Admitting: Podiatry

## 2013-01-18 VITALS — BP 152/73 | HR 66 | Resp 18

## 2013-01-18 DIAGNOSIS — M2040 Other hammer toe(s) (acquired), unspecified foot: Secondary | ICD-10-CM

## 2013-01-18 DIAGNOSIS — L97509 Non-pressure chronic ulcer of other part of unspecified foot with unspecified severity: Secondary | ICD-10-CM

## 2013-01-18 MED ORDER — HYDROCODONE-ACETAMINOPHEN 10-325 MG PO TABS: 1.0000 | ORAL_TABLET | Freq: Three times a day (TID) | ORAL | Status: DC | PRN

## 2013-01-18 NOTE — Progress Notes (Signed)
° °  Subjective:    Patient ID: [name], female    DOB: [birth year], 79 y.o.   MRN: [id number]  HPI It is not any better and it sore and swollen on my 2nd toe on right foot    Review of Systems     Objective:   Physical Exam        Assessment & Plan:

## 2013-01-18 NOTE — Progress Notes (Signed)
Subjective:     Patient ID: [name], female   DOB: Nov 19, 1916, 79 y.o.   MRN: [id number]  HPI patient presents with caregiver stating I'm worried about the second toe on my right foot. I still be pain in it and it doesn't look right to me   Review of Systems     Objective:   Physical Exam No change in neurovascular or health history. Right second toe does have discoloration in the distal one half of the toe itself but the toe itself is warm and this appears to be trauma with bleeding underneath the skin. No proximal edema erythema or lymph node distention noted    Assessment:     Most likely trauma to the toe with discoloration versus gangrene do to color change    Plan:     I explained the caregiver that most likely this will peel-off and there should be healthy skin underneath but at this time I want to give it time before I trimmed the tissue. I did explain to them that they may someday have to deal with amputation of this toe if it were to progress to gangrene or get worse

## 2013-02-01 ENCOUNTER — Ambulatory Visit: Payer: Self-pay | Admitting: Podiatry

## 2013-02-11 ENCOUNTER — Encounter: Payer: Self-pay | Admitting: Podiatry

## 2013-02-11 ENCOUNTER — Ambulatory Visit (INDEPENDENT_AMBULATORY_CARE_PROVIDER_SITE_OTHER): Payer: Medicare Other | Admitting: Podiatry

## 2013-02-11 VITALS — BP 142/72 | HR 84 | Resp 12

## 2013-02-11 DIAGNOSIS — M79609 Pain in unspecified limb: Secondary | ICD-10-CM

## 2013-02-11 DIAGNOSIS — M2040 Other hammer toe(s) (acquired), unspecified foot: Secondary | ICD-10-CM | POA: Diagnosis not present

## 2013-02-11 DIAGNOSIS — B351 Tinea unguium: Secondary | ICD-10-CM

## 2013-02-11 NOTE — Progress Notes (Signed)
Subjective:     Patient ID: [name], female   DOB: 11-Aug-1916, 80 y.o.   MRN: [id number]  HPI patient states my toe is feeling some better and that also like to get my toenails cut is they're thick and they hurt and I cannot cut them myself  Review of Systems     Objective:   Physical Exam Neurovascular status unchanged with nail disease 1-5 both feet that are sore and keratotic lesion second toe right which is healing from previous breakdown of tissue    Assessment:     Hammertoe second toe right with heel area and nail disease 1-5 both feet with thick debris and pain    Plan:     Debridement mycotic nail infection is 1-5 both feet and debris lesion right discussed hammertoe repair which we will avoid and less it's needed in the future

## 2013-02-15 ENCOUNTER — Ambulatory Visit: Payer: Medicare Other | Admitting: Podiatry

## 2013-02-26 DIAGNOSIS — K921 Melena: Secondary | ICD-10-CM | POA: Diagnosis not present

## 2013-02-26 DIAGNOSIS — K625 Hemorrhage of anus and rectum: Secondary | ICD-10-CM | POA: Diagnosis not present

## 2013-03-05 ENCOUNTER — Ambulatory Visit: Payer: Medicare Other | Admitting: Interventional Cardiology

## 2013-03-05 DIAGNOSIS — I509 Heart failure, unspecified: Secondary | ICD-10-CM | POA: Diagnosis not present

## 2013-03-05 DIAGNOSIS — Z1331 Encounter for screening for depression: Secondary | ICD-10-CM | POA: Diagnosis not present

## 2013-03-05 DIAGNOSIS — K625 Hemorrhage of anus and rectum: Secondary | ICD-10-CM | POA: Diagnosis not present

## 2013-03-05 DIAGNOSIS — I10 Essential (primary) hypertension: Secondary | ICD-10-CM | POA: Diagnosis not present

## 2013-03-05 DIAGNOSIS — Z0000 Encounter for general adult medical examination without abnormal findings: Secondary | ICD-10-CM | POA: Diagnosis not present

## 2013-03-05 DIAGNOSIS — R609 Edema, unspecified: Secondary | ICD-10-CM | POA: Diagnosis not present

## 2013-03-05 DIAGNOSIS — N951 Menopausal and female climacteric states: Secondary | ICD-10-CM | POA: Diagnosis not present

## 2013-03-05 DIAGNOSIS — L2089 Other atopic dermatitis: Secondary | ICD-10-CM | POA: Diagnosis not present

## 2013-03-05 DIAGNOSIS — I2510 Atherosclerotic heart disease of native coronary artery without angina pectoris: Secondary | ICD-10-CM | POA: Diagnosis not present

## 2013-03-09 DIAGNOSIS — I10 Essential (primary) hypertension: Secondary | ICD-10-CM | POA: Diagnosis not present

## 2013-03-29 ENCOUNTER — Ambulatory Visit: Payer: Medicare Other | Admitting: Podiatry

## 2013-04-09 DIAGNOSIS — L84 Corns and callosities: Secondary | ICD-10-CM | POA: Diagnosis not present

## 2013-04-09 DIAGNOSIS — I10 Essential (primary) hypertension: Secondary | ICD-10-CM | POA: Diagnosis not present

## 2013-04-09 DIAGNOSIS — J209 Acute bronchitis, unspecified: Secondary | ICD-10-CM | POA: Diagnosis not present

## 2013-04-21 DIAGNOSIS — M48061 Spinal stenosis, lumbar region without neurogenic claudication: Secondary | ICD-10-CM | POA: Diagnosis not present

## 2013-04-21 DIAGNOSIS — IMO0002 Reserved for concepts with insufficient information to code with codable children: Secondary | ICD-10-CM | POA: Diagnosis not present

## 2013-05-17 DIAGNOSIS — L84 Corns and callosities: Secondary | ICD-10-CM | POA: Diagnosis not present

## 2013-05-17 DIAGNOSIS — B353 Tinea pedis: Secondary | ICD-10-CM | POA: Diagnosis not present

## 2013-06-01 DIAGNOSIS — H8110 Benign paroxysmal vertigo, unspecified ear: Secondary | ICD-10-CM | POA: Diagnosis not present

## 2013-06-01 DIAGNOSIS — I10 Essential (primary) hypertension: Secondary | ICD-10-CM | POA: Diagnosis not present

## 2013-06-01 DIAGNOSIS — R197 Diarrhea, unspecified: Secondary | ICD-10-CM | POA: Diagnosis not present

## 2013-06-01 DIAGNOSIS — R42 Dizziness and giddiness: Secondary | ICD-10-CM | POA: Diagnosis not present

## 2013-06-30 DIAGNOSIS — R42 Dizziness and giddiness: Secondary | ICD-10-CM | POA: Diagnosis not present

## 2013-06-30 DIAGNOSIS — H8110 Benign paroxysmal vertigo, unspecified ear: Secondary | ICD-10-CM | POA: Diagnosis not present

## 2013-06-30 DIAGNOSIS — R197 Diarrhea, unspecified: Secondary | ICD-10-CM | POA: Diagnosis not present

## 2013-06-30 DIAGNOSIS — I10 Essential (primary) hypertension: Secondary | ICD-10-CM | POA: Diagnosis not present

## 2013-06-30 DIAGNOSIS — R609 Edema, unspecified: Secondary | ICD-10-CM | POA: Diagnosis not present

## 2013-07-05 ENCOUNTER — Emergency Department (HOSPITAL_COMMUNITY)
Admission: EM | Admit: 2013-07-05 | Discharge: 2013-07-05 | Disposition: A | Discharge status: Home / Self Care | Payer: Medicare Other | Attending: Emergency Medicine | Admitting: Emergency Medicine

## 2013-07-05 ENCOUNTER — Encounter (HOSPITAL_COMMUNITY): Payer: Self-pay | Admitting: Emergency Medicine

## 2013-07-05 ENCOUNTER — Emergency Department (HOSPITAL_COMMUNITY): Payer: Medicare Other

## 2013-07-05 DIAGNOSIS — E785 Hyperlipidemia, unspecified: Secondary | ICD-10-CM | POA: Diagnosis not present

## 2013-07-05 DIAGNOSIS — I509 Heart failure, unspecified: Secondary | ICD-10-CM | POA: Insufficient documentation

## 2013-07-05 DIAGNOSIS — K5730 Diverticulosis of large intestine without perforation or abscess without bleeding: Secondary | ICD-10-CM | POA: Diagnosis not present

## 2013-07-05 DIAGNOSIS — Z8701 Personal history of pneumonia (recurrent): Secondary | ICD-10-CM | POA: Insufficient documentation

## 2013-07-05 DIAGNOSIS — F3289 Other specified depressive episodes: Secondary | ICD-10-CM | POA: Insufficient documentation

## 2013-07-05 DIAGNOSIS — Z79899 Other long term (current) drug therapy: Secondary | ICD-10-CM | POA: Insufficient documentation

## 2013-07-05 DIAGNOSIS — Z880 Allergy status to penicillin: Secondary | ICD-10-CM | POA: Insufficient documentation

## 2013-07-05 DIAGNOSIS — M1990 Unspecified osteoarthritis, unspecified site: Secondary | ICD-10-CM | POA: Insufficient documentation

## 2013-07-05 DIAGNOSIS — Z792 Long term (current) use of antibiotics: Secondary | ICD-10-CM | POA: Insufficient documentation

## 2013-07-05 DIAGNOSIS — K5732 Diverticulitis of large intestine without perforation or abscess without bleeding: Secondary | ICD-10-CM | POA: Diagnosis not present

## 2013-07-05 DIAGNOSIS — Z872 Personal history of diseases of the skin and subcutaneous tissue: Secondary | ICD-10-CM | POA: Insufficient documentation

## 2013-07-05 DIAGNOSIS — Z7982 Long term (current) use of aspirin: Secondary | ICD-10-CM | POA: Diagnosis not present

## 2013-07-05 DIAGNOSIS — F329 Major depressive disorder, single episode, unspecified: Secondary | ICD-10-CM | POA: Insufficient documentation

## 2013-07-05 DIAGNOSIS — I2510 Atherosclerotic heart disease of native coronary artery without angina pectoris: Secondary | ICD-10-CM | POA: Diagnosis not present

## 2013-07-05 DIAGNOSIS — K5792 Diverticulitis of intestine, part unspecified, without perforation or abscess without bleeding: Secondary | ICD-10-CM

## 2013-07-05 DIAGNOSIS — Z8619 Personal history of other infectious and parasitic diseases: Secondary | ICD-10-CM | POA: Insufficient documentation

## 2013-07-05 LAB — CBC WITH DIFFERENTIAL/PLATELET
BASOS ABS: 0 10*3/uL (ref 0.0–0.1)
Basophils Relative: 0 % (ref 0–1)
Eosinophils Absolute: 0 10*3/uL (ref 0.0–0.7)
Eosinophils Relative: 0 % (ref 0–5)
HEMATOCRIT: 36.3 % (ref 36.0–46.0)
HEMOGLOBIN: 12.2 g/dL (ref 12.0–15.0)
LYMPHS PCT: 32 % (ref 12–46)
Lymphs Abs: 1.9 10*3/uL (ref 0.7–4.0)
MCH: 31 pg (ref 26.0–34.0)
MCHC: 33.6 g/dL (ref 30.0–36.0)
MCV: 92.1 fL (ref 78.0–100.0)
MONO ABS: 0.7 10*3/uL (ref 0.1–1.0)
Monocytes Relative: 11 % (ref 3–12)
NEUTROS ABS: 3.3 10*3/uL (ref 1.7–7.7)
Neutrophils Relative %: 57 % (ref 43–77)
Platelets: 181 10*3/uL (ref 150–400)
RBC: 3.94 MIL/uL (ref 3.87–5.11)
RDW: 13.5 % (ref 11.5–15.5)
WBC: 5.9 10*3/uL (ref 4.0–10.5)

## 2013-07-05 LAB — COMPREHENSIVE METABOLIC PANEL
ALK PHOS: 60 U/L (ref 39–117)
ALT: 7 U/L (ref 0–35)
AST: 21 U/L (ref 0–37)
Albumin: 3.5 g/dL (ref 3.5–5.2)
BILIRUBIN TOTAL: 0.4 mg/dL (ref 0.3–1.2)
BUN: 13 mg/dL (ref 6–23)
CHLORIDE: 102 meq/L (ref 96–112)
CO2: 26 meq/L (ref 19–32)
CREATININE: 0.91 mg/dL (ref 0.50–1.10)
Calcium: 9.1 mg/dL (ref 8.4–10.5)
GFR calc Af Amer: 61 mL/min — ABNORMAL LOW (ref >90)
GFR, EST NON AFRICAN AMERICAN: 52 mL/min — AB (ref >90)
Glucose, Bld: 86 mg/dL (ref 70–99)
POTASSIUM: 3.9 meq/L (ref 3.7–5.3)
Sodium: 139 mEq/L (ref 137–147)
Total Protein: 7 g/dL (ref 6.0–8.3)

## 2013-07-05 LAB — TYPE AND SCREEN
ABO/RH(D): A NEG
Antibody Screen: NEGATIVE

## 2013-07-05 LAB — POC OCCULT BLOOD, ED: Fecal Occult Bld: NEGATIVE

## 2013-07-05 MED ORDER — CIPROFLOXACIN HCL 500 MG PO TABS: 500.0000 mg | ORAL_TABLET | Freq: Two times a day (BID) | ORAL | Status: DC

## 2013-07-05 MED ORDER — METRONIDAZOLE 500 MG PO TABS
500.0000 mg | ORAL_TABLET | Freq: Two times a day (BID) | ORAL | Status: DC
Start: 2013-07-05 — End: 2013-07-23

## 2013-07-05 MED ORDER — SODIUM CHLORIDE 0.9 % IV BOLUS (SEPSIS)
500.0000 mL | Freq: Once | INTRAVENOUS | Status: AC
  Administered 2013-07-05: 500 mL via INTRAVENOUS

## 2013-07-05 MED ORDER — IOHEXOL 300 MG/ML  SOLN
50.0000 mL | Freq: Once | INTRAMUSCULAR | Status: AC | PRN
  Administered 2013-07-05: 50 mL via ORAL

## 2013-07-05 MED ORDER — IOHEXOL 300 MG/ML  SOLN
80.0000 mL | Freq: Once | INTRAMUSCULAR | Status: AC | PRN
  Administered 2013-07-05: 80 mL via INTRAVENOUS

## 2013-07-05 MED ORDER — SODIUM CHLORIDE 0.9 % IV BOLUS (SEPSIS): 1000.0000 mL | Freq: Once | INTRAVENOUS | Status: DC

## 2013-07-05 MED ORDER — CIPROFLOXACIN HCL 500 MG PO TABS
500.0000 mg | ORAL_TABLET | Freq: Once | ORAL | Status: AC
  Administered 2013-07-05: 500 mg via ORAL
  Filled 2013-07-05: qty 1

## 2013-07-05 MED ORDER — METRONIDAZOLE 500 MG PO TABS
500.0000 mg | ORAL_TABLET | Freq: Once | ORAL | Status: AC
Start: 2013-07-05 — End: 2013-07-05
  Administered 2013-07-05: 500 mg via ORAL
  Filled 2013-07-05: qty 1

## 2013-07-05 NOTE — ED Notes (Signed)
Pt states she does feel dizzy and short of breath at times.

## 2013-07-05 NOTE — ED Provider Notes (Signed)
Patient's CT scan with possible colitis and early diverticulitis. Pain is controlled. No rectal bleeding here. When questioned about her dizziness, she states this has been going on for over a year, and possibly more frequent now. She wants to go home, and has benign lab work and is well appearing at this time. D/w GI , Dr. Watt Climes, who recommends holding ASA 81 for a few days, liquid diet for 1-2 days and antibiotics. He will alert Dr. Cristina Gong, and patient will be called about close follow up in next few days at GI. Encouraged to return if her symptoms continue or worsen, and she has multiple family members at bedside willing to help take care of her at this time.  Ephraim Hamburger, MD 07/05/13 [phone]

## 2013-07-05 NOTE — ED Notes (Signed)
Pt states has a hx of rectal bleeding x years, states past 2 weeks started again but then got worse on Saturday, states it's dark red blood coming out of rectum, states pure blood. Pt states at times has abdominal pain, denies nausea or vomiting.

## 2013-07-05 NOTE — ED Provider Notes (Signed)
CSN: [id number]     Arrival date & time 07/05/13  1329 History   First MD Initiated Contact with Patient 07/05/13 1347     Chief Complaint  Patient presents with  . Rectal Bleeding     (Consider location/radiation/quality/duration/timing/severity/associated sxs/prior Treatment) HPI  This is a 80 are old female with a history of hyperglycemia, coronary artery disease, diverticulosis who presents with rectal bleeding. Patient reports that she has had rectal bleeding on and off for years. She states the last 2 weeks she has had worsening bleeding. She is noted bright red blood with every bowel movement since Saturday. She denies any dark stools. She denies any vomiting or nausea. She does report crampy left-sided abdominal pain that comes and goes. It is not relieved or exacerbated by eating. Currently her pain is 2/10.  Patient does endorse dizziness mostly with standing. She denies any syncope.  Past Medical History  Diagnosis Date  . Hyperglycemia   . CAD (coronary artery disease)   . Pneumonia     july 2009  . Hyperlipidemia   . Hormone replacement therapy (postmenopausal)   . Diverticulosis   . DJD (degenerative joint disease) of lumbar spine     scoliosis  . DJD (degenerative joint disease) of knee   . Sciatica     right lower extremity  . Allergic rhinitis   . Reactive depression (situational)   . Shingles     right thoracic 2008  . Eczema   . GI bleed   . Diverticular disease   . Esophageal erosions     from nsaid   . Cough   . Orthopnea   . Vertigo   . CHF (congestive heart failure)    Past Surgical History  Procedure Laterality Date  . Back surgery    . Total abdominal hysterectomy    . Breast lumpectomy    . Appendectomy     Family History  Problem Relation Age of Onset  . Heart disease Daughter   . Heart disease Son   . Heart disease Father   . Heart disease Mother   . Cancer Brother   . Heart disease Sister    History  Substance Use Topics  .  Smoking status: Never Smoker   . Smokeless tobacco: Never Used  . Alcohol Use: Yes   OB History   Grav Para Term Preterm Abortions TAB SAB Ect Mult Living                 Review of Systems  Constitutional: Negative for fever.  Respiratory: Negative for cough, chest tightness and shortness of breath.   Cardiovascular: Negative for chest pain.  Gastrointestinal: Positive for abdominal pain and blood in stool. Negative for nausea, vomiting and rectal pain.  Genitourinary: Negative for dysuria.  Musculoskeletal: Negative for back pain.  Neurological: Negative for headaches.  Psychiatric/Behavioral: Negative for confusion.  All other systems reviewed and are negative.     Allergies  Ace inhibitors; Penicillins; Streptomycin; and Tramadol  Home Medications   Prior to Admission medications   Medication Sig Start Date End Date Taking? Authorizing Provider  aspirin 81 MG tablet Take 81 mg by mouth See admin instructions. Monday Wednesday Friday   Yes Historical Provider, MD  atenolol (TENORMIN) 25 MG tablet Take 25 mg by mouth daily.   Yes Historical Provider, MD  citalopram (CELEXA) 40 MG tablet Take 40 mg by mouth daily.     Yes Historical Provider, MD  clotrimazole (LOTRIMIN) 1 % cream Apply 1  application topically 2 (two) times daily.   Yes Historical Provider, MD  estrogens, conjugated, (PREMARIN) 0.3 MG tablet Take 0.3 mg by mouth daily.    Yes Historical Provider, MD  furosemide (LASIX) 40 MG tablet Take 40 mg by mouth daily.    Yes Historical Provider, MD  isosorbide mononitrate (IMDUR) 30 MG 24 hr tablet Take 1 tablet (30 mg total) by mouth daily. 01/08/13  Yes Jettie Booze, MD  ketoconazole (NIZORAL) 2 % cream Apply 1 application topically 2 (two) times daily as needed for irritation.   Yes Historical Provider, MD  loperamide (AF-LOPERAMIDE HCL) 2 MG tablet Take 2 mg by mouth as needed for diarrhea or loose stools. 1 capsule up to 8 tablets per 24hour 8 times a day   Yes  Historical Provider, MD  meclizine (ANTIVERT) 25 MG tablet Take 25 mg by mouth 2 (two) times daily as needed for dizziness or nausea.    Yes Historical Provider, MD  potassium chloride (KLOR-CON) 10 MEQ CR tablet Take 10 mEq by mouth 2 (two) times daily.     Yes Historical Provider, MD  Prenatal Vit-Fe Fumarate-FA (MULTIVITAMIN-PRENATAL) 27-0.8 MG TABS Take 1 tablet by mouth daily.     Yes Historical Provider, MD  triamcinolone cream (KENALOG) 0.1 % Apply 1 application topically 2 (two) times daily.   Yes Historical Provider, MD  ciprofloxacin (CIPRO) 500 MG tablet Take 1 tablet (500 mg total) by mouth 2 (two) times daily. One po bid x 10 days 07/05/13   Ephraim Hamburger, MD  metroNIDAZOLE (FLAGYL) 500 MG tablet Take 1 tablet (500 mg total) by mouth 2 (two) times daily. One po bid x 10 days 07/05/13   Ephraim Hamburger, MD   BP 131/78  Pulse 62  Temp(Src) 97.6 F (36.4 C) (Oral)  Resp 17  Ht 5\' 8"  (1.727 m)  Wt 156 lb (70.761 kg)  BMI 23.73 kg/m2  SpO2 95% Physical Exam  Nursing note and vitals reviewed. Constitutional: She is oriented to person, place, and time. No distress.  Appears younger than stated age  HENT:  Head: Normocephalic and atraumatic.  Mouth/Throat: Oropharynx is clear and moist.  Eyes: Pupils are equal, round, and reactive to light.  Cardiovascular: Normal rate, regular rhythm and normal heart sounds.   No murmur heard. Pulmonary/Chest: Effort normal and breath sounds normal. No respiratory distress. She has no wheezes.  Abdominal: Soft. Bowel sounds are normal. There is tenderness. There is no rebound and no guarding.  Midline abdominal scar noted, mild tenderness to palpation of the left lower quadrant  Musculoskeletal: She exhibits no edema.  Neurological: She is alert and oriented to person, place, and time.  Skin: Skin is warm and dry.  Psychiatric: She has a normal mood and affect.    ED Course  Procedures (including critical care time) Labs Review Labs  Reviewed  COMPREHENSIVE METABOLIC PANEL - Abnormal; Notable for the following:    GFR calc non Af Amer 52 (*)    GFR calc Af Amer 61 (*)    All other components within normal limits  CBC WITH DIFFERENTIAL  POC OCCULT BLOOD, ED  TYPE AND SCREEN    Imaging Review Ct Abdomen Pelvis W Contrast  07/05/2013   CLINICAL DATA:  80 year old female with rectal bleeding. Dark red blood per rectum. Initial encounter.  EXAM: CT ABDOMEN AND PELVIS WITH CONTRAST  TECHNIQUE: Multidetector CT imaging of the abdomen and pelvis was performed using the standard protocol following bolus administration of intravenous contrast.  CONTRAST:  26mL OMNIPAQUE IOHEXOL 300 MG/ML SOLN, 67mL OMNIPAQUE IOHEXOL 300 MG/ML SOLN  COMPARISON:  11/02/2008 CT Abdomen and Pelvis  FINDINGS: Lower lung volumes and new gastric hiatal hernia, moderate in size. Dependent lung base opacity most resembles atelectasis. No pericardial or pleural effusion.  Advanced chronic in degenerative changes in the spine. No acute osseous abnormality identified.  Chronic small volume fluid posterior to the vaginal cuff is decreased since 2010. No definite pelvic free fluid. The uterus is surgically absent as before. Unremarkable bladder.  Rectum is within normal limits. There is sigmoid diverticulosis. No sigmoid mesenteric stranding. There is mild sigmoid wall thickening (coronal image 38) in the mid sigmoid. No associated retroperitoneal stranding. No fluid in the left gutter.  Diverticulosis continues in the descending colon but is less pronounced. There is also diverticulosis in the right colon which does appear mildly inflamed on the basis of increased mural enhancement and indistinctness (coronal image 41). No right pericolic gutter fluid.  No dilated small bowel. Oral contrast has not yet reached the distal small bowel. Hiatal hernia, otherwise negative stomach and duodenum.  Gallbladder with Stable central intrahepatic biliary enlargement. Stable CBD  enlargement since 2010, up to about 9 mm diameter. Negative liver otherwise. Spleen, pancreas and adrenal glands are stable and within normal limits. Negative kidneys. Portal venous system appears patent. Major arterial structures in the abdomen and pelvis are patent with mild tortuosity of the abdominal aorta. No abdominal free fluid. No lymphadenopathy.  IMPRESSION: 1. Extensive diverticulosis of the colon, most pronounced in the sigmoid, but also in the right colon where I suspect there is mild acute colitis. No free fluid or free air. Mild sigmoid diverticulitis also would be difficult to exclude. 2. No other acute findings in the abdomen or pelvis. Moderate size gastric hiatal hernia is new since 2010. 3. Chronic intra- and extrahepatic biliary ductal enlargement is stable. Regressed chronic small volume fluid about the vaginal cuff in the pelvis.   Electronically Signed   By: Lars Pinks M.D.   On: 07/05/2013 17:19     EKG Interpretation None      MDM   Final diagnoses:  Diverticulitis    Patient presents with bright red blood per rectum and left lower quadrant pain. She is nontoxic on exam and vital signs are reassuring. No evidence of orthostasis however the patient does endorse dizziness. Patient was given fluids.  Hematocrit is 36.3. Hemoccult-negative.  Patient has a history of diverticulosis; however, I would expect this to be a painless bleed. Will get a CT scan to evaluate for diverticulitis or colitis given the pain. Discuss with patient that ultimate disposition will be determined by CT scan. She is well-appearing but would have a low threshold for admission given her age. Patient signed out to Dr. Verta Ellen. Patient followed by GI Dr. Cristina Gong.    Merryl Hacker, MD 07/06/13 [phone]

## 2013-07-05 NOTE — Discharge Instructions (Signed)
Diverticulitis °A diverticulum is a small pouch or sac on the colon. Diverticulosis is the presence of these diverticula on the colon. Diverticulitis is the irritation (inflammation) or infection of diverticula. °CAUSES  °The colon and its diverticula contain bacteria. If food particles block the tiny opening to a diverticulum, the bacteria inside can grow and cause an increase in pressure. This leads to infection and inflammation and is called diverticulitis. °SYMPTOMS  °· Abdominal pain and tenderness. Usually, the pain is located on the left side of your abdomen. However, it could be located elsewhere. °· Fever. °· Bloating. °· Feeling sick to your stomach (nausea). °· Throwing up (vomiting). °· Abnormal stools. °DIAGNOSIS  °Your caregiver will take a history and perform a physical exam. Since many things can cause abdominal pain, other tests may be necessary. Tests may include: °· Blood tests. °· Urine tests. °· X-ray of the abdomen. °· CT scan of the abdomen. °Sometimes, surgery is needed to determine if diverticulitis or other conditions are causing your symptoms. °TREATMENT  °Most of the time, you can be treated without surgery. Treatment includes: °· Resting the bowels by only having liquids for a few days. As you improve, you will need to eat a low-fiber diet. °· Intravenous (IV) fluids if you are losing body fluids (dehydrated). °· Antibiotic medicines that treat infections may be given. °· Pain and nausea medicine, if needed. °· Surgery if the inflamed diverticulum has burst. °HOME CARE INSTRUCTIONS  °· Try a clear liquid diet (broth, tea, or water for as long as directed by your caregiver). You may then gradually begin a low-fiber diet as tolerated.  °A low-fiber diet is a diet with less than 10 grams of fiber. Choose the foods below to reduce fiber in the diet: °· White breads, cereals, rice, and pasta. °· Cooked fruits and vegetables or soft fresh fruits and vegetables without the skin. °· Ground or  well-cooked tender beef, ham, veal, lamb, pork, or poultry. °· Eggs and seafood. °· After your diverticulitis symptoms have improved, your caregiver may put you on a high-fiber diet. A high-fiber diet includes 14 grams of fiber for every 1000 calories consumed. For a standard 2000 calorie diet, you would need 28 grams of fiber. Follow these diet guidelines to help you increase the fiber in your diet. It is important to slowly increase the amount fiber in your diet to avoid gas, constipation, and bloating. °· Choose whole-grain breads, cereals, pasta, and brown rice. °· Choose fresh fruits and vegetables with the skin on. Do not overcook vegetables because the more vegetables are cooked, the more fiber is lost. °· Choose more nuts, seeds, legumes, dried peas, beans, and lentils. °· Look for food products that have greater than 3 grams of fiber per serving on the Nutrition Facts label. °· Take all medicine as directed by your caregiver. °· If your caregiver has given you a follow-up appointment, it is very important that you go. Not going could result in lasting (chronic) or permanent injury, pain, and disability. If there is any problem keeping the appointment, call to reschedule. °SEEK MEDICAL CARE IF:  °· Your pain does not improve. °· You have a hard time advancing your diet beyond clear liquids. °· Your bowel movements do not return to normal. °SEEK IMMEDIATE MEDICAL CARE IF:  °· Your pain becomes worse. °· You have an oral temperature above 102° F (38.9° C), not controlled by medicine. °· You have repeated vomiting. °· You have bloody or black, tarry stools. °·   Symptoms that brought you to your caregiver become worse or are not getting better. °MAKE SURE YOU:  °· Understand these instructions. °· Will watch your condition. °· Will get help right away if you are not doing well or get worse. °Document Released: 10/24/2004 Document Revised: 04/08/2011 Document Reviewed: 02/19/2010 °[employer]® Patient Information  ©2014 [employer]. ° °

## 2013-07-12 DIAGNOSIS — K5732 Diverticulitis of large intestine without perforation or abscess without bleeding: Secondary | ICD-10-CM | POA: Diagnosis not present

## 2013-07-12 DIAGNOSIS — K921 Melena: Secondary | ICD-10-CM | POA: Diagnosis not present

## 2013-07-23 ENCOUNTER — Telehealth: Payer: Self-pay | Admitting: Cardiology

## 2013-07-23 ENCOUNTER — Ambulatory Visit (INDEPENDENT_AMBULATORY_CARE_PROVIDER_SITE_OTHER): Payer: Medicare Other | Admitting: Interventional Cardiology

## 2013-07-23 ENCOUNTER — Encounter: Payer: Self-pay | Admitting: Interventional Cardiology

## 2013-07-23 VITALS — BP 137/94 | HR 64 | Wt 157.0 lb

## 2013-07-23 DIAGNOSIS — I10 Essential (primary) hypertension: Secondary | ICD-10-CM

## 2013-07-23 DIAGNOSIS — R0602 Shortness of breath: Secondary | ICD-10-CM | POA: Diagnosis not present

## 2013-07-23 DIAGNOSIS — R9430 Abnormal result of cardiovascular function study, unspecified: Secondary | ICD-10-CM

## 2013-07-23 DIAGNOSIS — K625 Hemorrhage of anus and rectum: Secondary | ICD-10-CM | POA: Diagnosis not present

## 2013-07-23 DIAGNOSIS — Z79899 Other long term (current) drug therapy: Secondary | ICD-10-CM

## 2013-07-23 LAB — BASIC METABOLIC PANEL
BUN: 22 mg/dL (ref 6–23)
CHLORIDE: 107 meq/L (ref 96–112)
CO2: 25 meq/L (ref 19–32)
Calcium: 8.6 mg/dL (ref 8.4–10.5)
Creatinine, Ser: 1.3 mg/dL — ABNORMAL HIGH (ref 0.4–1.2)
GFR: 47.69 mL/min — ABNORMAL LOW (ref >60.00)
GLUCOSE: 92 mg/dL (ref 70–99)
POTASSIUM: 3.3 meq/L — AB (ref 3.5–5.1)
SODIUM: 140 meq/L (ref 135–145)

## 2013-07-23 LAB — BRAIN NATRIURETIC PEPTIDE: Pro B Natriuretic peptide (BNP): 233 pg/mL — ABNORMAL HIGH (ref 0.0–100.0)

## 2013-07-23 NOTE — Patient Instructions (Signed)
Your physician recommends that you return for lab work today for BNP and BMet.  Your physician wants you to follow-up in: 6 months with Dr. Irish Lack. You will receive a reminder letter in the mail two months in advance. If you don't receive a letter, please call our office to schedule the follow-up appointment.

## 2013-07-23 NOTE — Progress Notes (Signed)
Patient ID: [name], female   DOB: 1916-07-19, 80 y.o.   MRN: [id number] Patient ID: [name], female   DOB: [birth year], 80 y.o.   MRN: [id number]    [place], [place], [place]  [address] Phone: [phone] Fax:  [phone]  Date:  07/23/2013   ID:  [name], DOB [birth year], MRN [id number]  PCP:  Henrine Screws, MD      History of Present Illness: [name] is a 80 y.o. female who had an abnormal stress test several years ago. SHe has DOE when she walks fast. Walking is limited by back and foot pain. She had significant bruising in her right foot, second toe.  No chest pain with activity. She has some left breast pain with lying down, after eating and to palpation. She feels tired. She feels that the Lasix 40 mg makes her urinate too much.  She does not walk much. Housework is the main exercise, but this has been limited due to her foot problem. Not walking daily.  She is taking Lasix every day now. SHe does not urinate as much.    Wt Readings from Last 3 Encounters:  07/23/13 157 lb (71.215 kg)  07/05/13 156 lb (70.761 kg)  01/08/13 157 lb (71.215 kg)     Past Medical History  Diagnosis Date  . Hyperglycemia   . CAD (coronary artery disease)   . Pneumonia     july 2009  . Hyperlipidemia   . Hormone replacement therapy (postmenopausal)   . Diverticulosis   . DJD (degenerative joint disease) of lumbar spine     scoliosis  . DJD (degenerative joint disease) of knee   . Sciatica     right lower extremity  . Allergic rhinitis   . Reactive depression (situational)   . Shingles     right thoracic 2008  . Eczema   . GI bleed   . Diverticular disease   . Esophageal erosions     from nsaid   . Cough   . Orthopnea   . Vertigo   . CHF (congestive heart failure)     Current Outpatient Prescriptions  Medication Sig Dispense Refill  . aspirin 81 MG tablet Take 81 mg by mouth See admin instructions. Monday Wednesday  Friday      . atenolol (TENORMIN) 25 MG tablet Take 25 mg by mouth daily.      . citalopram (CELEXA) 40 MG tablet Take 40 mg by mouth daily.        . clotrimazole (LOTRIMIN) 1 % cream Apply 1 application topically 2 (two) times daily.      Marland Kitchen estrogens, conjugated, (PREMARIN) 0.3 MG tablet Take 0.3 mg by mouth daily.       . furosemide (LASIX) 40 MG tablet Take 40 mg by mouth daily.       . isosorbide mononitrate (IMDUR) 30 MG 24 hr tablet Take 1 tablet (30 mg total) by mouth daily.  90 tablet  3  . ketoconazole (NIZORAL) 2 % cream Apply 1 application topically 2 (two) times daily as needed for irritation.      Marland Kitchen loperamide (AF-LOPERAMIDE HCL) 2 MG tablet Take 2 mg by mouth as needed for diarrhea or loose stools. 1 capsule up to 8 tablets per 24hour 8 times a day      . meclizine (ANTIVERT) 25 MG tablet Take 25 mg by mouth 2 (two) times daily as needed for dizziness or nausea.       Marland Kitchen  potassium chloride (KLOR-CON) 10 MEQ CR tablet Take 10 mEq by mouth 2 (two) times daily.        . Prenatal Vit-Fe Fumarate-FA (MULTIVITAMIN-PRENATAL) 27-0.8 MG TABS Take 1 tablet by mouth daily.        Marland Kitchen triamcinolone cream (KENALOG) 0.1 % Apply 1 application topically 2 (two) times daily.       No current facility-administered medications for this visit.    Allergies:    Allergies  Allergen Reactions  . Ace Inhibitors Hives and Itching  . Penicillins     Pruritic rash  . Streptomycin     Pruritus   . Tramadol     unknown    Social History:  The patient  reports that she has never smoked. She has never used smokeless tobacco. She reports that she drinks alcohol. She reports that she does not use illicit drugs.   Family History:  The patient's family history includes Cancer in her brother; Heart disease in her daughter, father, mother, sister, and son.   ROS:  Please see the history of present illness.  No nausea, vomiting.  No fevers, chills.  No focal weakness.  No dysuria. Several days of BRBPR  All  other systems reviewed and negative.   PHYSICAL EXAM: VS:  BP 137/94  Pulse 64  Wt 157 lb (71.215 kg) Well nourished, well developed, in no acute distress HEENT: normal Neck: no JVD, no carotid bruits Cardiac:  normal S1, S2; RRR; 2/6 systolic murmur; left chest painful to palpation a lower rib cage, costochondral junction Lungs:  clear to auscultation bilaterally, no wheezing, rhonchi or rales Abd: soft, nontender, no hepatomegaly Ext: no edema; swollen bruised 2nd toe on right foot Skin: warm and dry Neuro:   no focal abnormalities noted    ECG: NSR, poor R wave progression. NSST.   ASSESSMENT AND PLAN:  Nonspecific abnormal unspecified cardiovascular function study  Continue Isosorbide Mononitrate Tablet Extended Release 24 Hour, 30 MG, 1 tablet, Orally, Once a day Notes: No angina. Atypical chest pain under the left breast that is atypical for CAD.  This may be costochondritis. I do not want to give her any more NSAIDs due to her rectal bleeding. Mild lateral wall defect on stress test several years ago.  2. Essential hypertension, benign  Continue Metoprolol Succinate Tablet Extended Release 24 Hour, 25 MG, one tablet, Orally, b.i.d. Notes: Improved. Started amlodipine 5 mg daily. Check BP at home 3. Shortness of breath  Notes: Worsening shortness of breath.  Increased to lasix 40 mg daily. Check BNP.  Likely multifactorial form deconditioning and age along with diastolic dysfunction. Eats bananas to replace potassium. Hopefully can increase walking when toe is better.   4. Others  IMAGING: EKG   Harward,Amy 07/06/2012 10:28:45 AM > VARANASI,JAY 07/06/2012 11:44:45 AM > NSR, PRWP, LAD, NSST  Continue wound care for her right toe. 5. rectal bleeding: recent diverticulosis requiring hospital stay.  Hbg was 12.1.   Signed, Mina Marble, MD, [facility] 07/23/2013 10:10 AM

## 2013-07-23 NOTE — Telephone Encounter (Signed)
Labs ordered.

## 2013-08-06 ENCOUNTER — Other Ambulatory Visit (INDEPENDENT_AMBULATORY_CARE_PROVIDER_SITE_OTHER): Payer: Medicare Other

## 2013-08-06 DIAGNOSIS — Z79899 Other long term (current) drug therapy: Secondary | ICD-10-CM | POA: Diagnosis not present

## 2013-08-06 LAB — BASIC METABOLIC PANEL
BUN: 15 mg/dL (ref 6–23)
CHLORIDE: 101 meq/L (ref 96–112)
CO2: 23 meq/L (ref 19–32)
CREATININE: 1.3 mg/dL — AB (ref 0.4–1.2)
Calcium: 9.1 mg/dL (ref 8.4–10.5)
GFR: 47.27 mL/min — ABNORMAL LOW (ref >60.00)
Glucose, Bld: 134 mg/dL — ABNORMAL HIGH (ref 70–99)
POTASSIUM: 3.6 meq/L (ref 3.5–5.1)
Sodium: 136 mEq/L (ref 135–145)

## 2013-08-10 ENCOUNTER — Encounter (HOSPITAL_COMMUNITY): Payer: Self-pay | Admitting: Emergency Medicine

## 2013-08-10 ENCOUNTER — Emergency Department (HOSPITAL_COMMUNITY): Payer: Medicare Other

## 2013-08-10 ENCOUNTER — Inpatient Hospital Stay (HOSPITAL_COMMUNITY)
Admission: EM | Admit: 2013-08-10 | Discharge: 2013-08-14 | DRG: 348 | Disposition: A | Discharge status: Home / Self Care | Payer: Medicare Other | Attending: Internal Medicine | Admitting: Internal Medicine

## 2013-08-10 DIAGNOSIS — D509 Iron deficiency anemia, unspecified: Secondary | ICD-10-CM | POA: Diagnosis not present

## 2013-08-10 DIAGNOSIS — K449 Diaphragmatic hernia without obstruction or gangrene: Secondary | ICD-10-CM | POA: Diagnosis present

## 2013-08-10 DIAGNOSIS — N179 Acute kidney failure, unspecified: Secondary | ICD-10-CM | POA: Diagnosis present

## 2013-08-10 DIAGNOSIS — D62 Acute posthemorrhagic anemia: Secondary | ICD-10-CM | POA: Diagnosis present

## 2013-08-10 DIAGNOSIS — R531 Weakness: Secondary | ICD-10-CM | POA: Diagnosis present

## 2013-08-10 DIAGNOSIS — Z8249 Family history of ischemic heart disease and other diseases of the circulatory system: Secondary | ICD-10-CM | POA: Diagnosis not present

## 2013-08-10 DIAGNOSIS — F329 Major depressive disorder, single episode, unspecified: Secondary | ICD-10-CM | POA: Diagnosis present

## 2013-08-10 DIAGNOSIS — I2510 Atherosclerotic heart disease of native coronary artery without angina pectoris: Secondary | ICD-10-CM | POA: Diagnosis present

## 2013-08-10 DIAGNOSIS — D696 Thrombocytopenia, unspecified: Secondary | ICD-10-CM

## 2013-08-10 DIAGNOSIS — I509 Heart failure, unspecified: Secondary | ICD-10-CM | POA: Diagnosis present

## 2013-08-10 DIAGNOSIS — Z7982 Long term (current) use of aspirin: Secondary | ICD-10-CM | POA: Diagnosis not present

## 2013-08-10 DIAGNOSIS — K625 Hemorrhage of anus and rectum: Secondary | ICD-10-CM | POA: Diagnosis not present

## 2013-08-10 DIAGNOSIS — E785 Hyperlipidemia, unspecified: Secondary | ICD-10-CM | POA: Diagnosis present

## 2013-08-10 DIAGNOSIS — M47817 Spondylosis without myelopathy or radiculopathy, lumbosacral region: Secondary | ICD-10-CM | POA: Diagnosis present

## 2013-08-10 DIAGNOSIS — R5381 Other malaise: Secondary | ICD-10-CM | POA: Diagnosis not present

## 2013-08-10 DIAGNOSIS — K5732 Diverticulitis of large intestine without perforation or abscess without bleeding: Secondary | ICD-10-CM | POA: Diagnosis not present

## 2013-08-10 DIAGNOSIS — H9190 Unspecified hearing loss, unspecified ear: Secondary | ICD-10-CM | POA: Diagnosis present

## 2013-08-10 DIAGNOSIS — K648 Other hemorrhoids: Secondary | ICD-10-CM | POA: Diagnosis not present

## 2013-08-10 DIAGNOSIS — F411 Generalized anxiety disorder: Secondary | ICD-10-CM | POA: Diagnosis present

## 2013-08-10 DIAGNOSIS — E860 Dehydration: Secondary | ICD-10-CM | POA: Diagnosis present

## 2013-08-10 DIAGNOSIS — F3289 Other specified depressive episodes: Secondary | ICD-10-CM | POA: Diagnosis present

## 2013-08-10 DIAGNOSIS — Z79899 Other long term (current) drug therapy: Secondary | ICD-10-CM

## 2013-08-10 DIAGNOSIS — Z6826 Body mass index (BMI) 26.0-26.9, adult: Secondary | ICD-10-CM | POA: Diagnosis not present

## 2013-08-10 DIAGNOSIS — I5032 Chronic diastolic (congestive) heart failure: Secondary | ICD-10-CM | POA: Diagnosis present

## 2013-08-10 DIAGNOSIS — K921 Melena: Secondary | ICD-10-CM | POA: Diagnosis not present

## 2013-08-10 DIAGNOSIS — I10 Essential (primary) hypertension: Secondary | ICD-10-CM | POA: Diagnosis present

## 2013-08-10 DIAGNOSIS — R109 Unspecified abdominal pain: Secondary | ICD-10-CM | POA: Diagnosis not present

## 2013-08-10 HISTORY — DX: Essential (primary) hypertension: I10

## 2013-08-10 LAB — CBC WITH DIFFERENTIAL/PLATELET
Basophils Absolute: 0 10*3/uL (ref 0.0–0.1)
Basophils Absolute: 0 10*3/uL (ref 0.0–0.1)
Basophils Relative: 0 % (ref 0–1)
Basophils Relative: 0 % (ref 0–1)
Eosinophils Absolute: 0 10*3/uL (ref 0.0–0.7)
Eosinophils Absolute: 0 10*3/uL (ref 0.0–0.7)
Eosinophils Relative: 0 % (ref 0–5)
Eosinophils Relative: 0 % (ref 0–5)
HCT: 35.9 % — ABNORMAL LOW (ref 36.0–46.0)
HCT: 37.4 % (ref 36.0–46.0)
Hemoglobin: 12.2 g/dL (ref 12.0–15.0)
Hemoglobin: 12.7 g/dL (ref 12.0–15.0)
LYMPHS PCT: 36 % (ref 12–46)
Lymphocytes Relative: 31 % (ref 12–46)
Lymphs Abs: 2 10*3/uL (ref 0.7–4.0)
Lymphs Abs: 2.6 10*3/uL (ref 0.7–4.0)
MCH: 31.1 pg (ref 26.0–34.0)
MCH: 31.3 pg (ref 26.0–34.0)
MCHC: 34 g/dL (ref 30.0–36.0)
MCHC: 34 g/dL (ref 30.0–36.0)
MCV: 91.6 fL (ref 78.0–100.0)
MCV: 92.1 fL (ref 78.0–100.0)
Monocytes Absolute: 0.8 10*3/uL (ref 0.1–1.0)
Monocytes Absolute: 0.8 10*3/uL (ref 0.1–1.0)
Monocytes Relative: 13 % — ABNORMAL HIGH (ref 3–12)
Monocytes Relative: 11 % (ref 3–12)
NEUTROS PCT: 53 % (ref 43–77)
Neutro Abs: 3.7 10*3/uL (ref 1.7–7.7)
Neutro Abs: 3.8 10*3/uL (ref 1.7–7.7)
Neutrophils Relative %: 56 % (ref 43–77)
Platelets: 121 10*3/uL — ABNORMAL LOW (ref 150–400)
Platelets: 150 10*3/uL (ref 150–400)
RBC: 3.92 MIL/uL (ref 3.87–5.11)
RBC: 4.06 MIL/uL (ref 3.87–5.11)
RDW: 13.4 % (ref 11.5–15.5)
RDW: 13.3 % (ref 11.5–15.5)
WBC: 6.6 10*3/uL (ref 4.0–10.5)
WBC: 7.2 10*3/uL (ref 4.0–10.5)

## 2013-08-10 LAB — URINALYSIS, ROUTINE W REFLEX MICROSCOPIC
Bilirubin Urine: NEGATIVE
Glucose, UA: NEGATIVE mg/dL
Ketones, ur: NEGATIVE mg/dL
Leukocytes, UA: NEGATIVE
Nitrite: NEGATIVE
Protein, ur: NEGATIVE mg/dL
Specific Gravity, Urine: 1.019 (ref 1.005–1.030)
Urobilinogen, UA: 0.2 mg/dL (ref 0.0–1.0)
pH: 6.5 (ref 5.0–8.0)

## 2013-08-10 LAB — APTT: aPTT: 62 seconds — ABNORMAL HIGH (ref 24–37)

## 2013-08-10 LAB — COMPREHENSIVE METABOLIC PANEL
ALT: 8 U/L (ref 0–35)
ALT: 8 U/L (ref 0–35)
AST: 19 U/L (ref 0–37)
AST: 20 U/L (ref 0–37)
Albumin: 3.6 g/dL (ref 3.5–5.2)
Albumin: 3.7 g/dL (ref 3.5–5.2)
Alkaline Phosphatase: 62 U/L (ref 39–117)
Alkaline Phosphatase: 64 U/L (ref 39–117)
Anion gap: 16 — ABNORMAL HIGH (ref 5–15)
Anion gap: 15 (ref 5–15)
BUN: 16 mg/dL (ref 6–23)
BUN: 15 mg/dL (ref 6–23)
CALCIUM: 9.8 mg/dL (ref 8.4–10.5)
CO2: 25 mEq/L (ref 19–32)
CO2: 27 mEq/L (ref 19–32)
Calcium: 9.7 mg/dL (ref 8.4–10.5)
Chloride: 101 mEq/L (ref 96–112)
Chloride: 97 mEq/L (ref 96–112)
Creatinine, Ser: 1.11 mg/dL — ABNORMAL HIGH (ref 0.50–1.10)
Creatinine, Ser: 1.01 mg/dL (ref 0.50–1.10)
GFR calc Af Amer: 48 mL/min — ABNORMAL LOW (ref >90)
GFR calc non Af Amer: 41 mL/min — ABNORMAL LOW (ref >90)
GFR calc non Af Amer: 46 mL/min — ABNORMAL LOW (ref >90)
GFR, EST AFRICAN AMERICAN: 53 mL/min — AB (ref >90)
GLUCOSE: 85 mg/dL (ref 70–99)
Glucose, Bld: 84 mg/dL (ref 70–99)
Potassium: 4.1 mEq/L (ref 3.7–5.3)
Potassium: 4.5 mEq/L (ref 3.7–5.3)
SODIUM: 139 meq/L (ref 137–147)
Sodium: 142 mEq/L (ref 137–147)
Total Bilirubin: 0.3 mg/dL (ref 0.3–1.2)
Total Bilirubin: 0.4 mg/dL (ref 0.3–1.2)
Total Protein: 7.3 g/dL (ref 6.0–8.3)
Total Protein: 7.6 g/dL (ref 6.0–8.3)

## 2013-08-10 LAB — POC OCCULT BLOOD, ED: Fecal Occult Bld: POSITIVE — AB

## 2013-08-10 LAB — MAGNESIUM: Magnesium: 1.6 mg/dL (ref 1.5–2.5)

## 2013-08-10 LAB — PHOSPHORUS: Phosphorus: 2.8 mg/dL (ref 2.3–4.6)

## 2013-08-10 LAB — PROTIME-INR
INR: 1.01 (ref 0.00–1.49)
Prothrombin Time: 13.3 seconds (ref 11.6–15.2)

## 2013-08-10 LAB — TSH: TSH: 3.04 u[IU]/mL (ref 0.350–4.500)

## 2013-08-10 LAB — I-STAT CG4 LACTIC ACID, ED: Lactic Acid, Venous: 1.73 mmol/L (ref 0.5–2.2)

## 2013-08-10 LAB — URINE MICROSCOPIC-ADD ON

## 2013-08-10 MED ORDER — IOHEXOL 300 MG/ML  SOLN
50.0000 mL | Freq: Once | INTRAMUSCULAR | Status: AC | PRN
  Administered 2013-08-10: 50 mL via ORAL

## 2013-08-10 MED ORDER — ONDANSETRON HCL 4 MG/2ML IJ SOLN: 4.0000 mg | Freq: Four times a day (QID) | INTRAMUSCULAR | Status: DC | PRN

## 2013-08-10 MED ORDER — SODIUM CHLORIDE 0.9 % IV SOLN
INTRAVENOUS | Status: AC
  Administered 2013-08-10: 19:00:00 via INTRAVENOUS

## 2013-08-10 MED ORDER — CLOTRIMAZOLE 1 % EX CREA
1.0000 "application " | TOPICAL_CREAM | Freq: Two times a day (BID) | CUTANEOUS | Status: DC
  Administered 2013-08-11 – 2013-08-14 (×3): 1 via TOPICAL
  Filled 2013-08-10: qty 15

## 2013-08-10 MED ORDER — CITALOPRAM HYDROBROMIDE 40 MG PO TABS
40.0000 mg | ORAL_TABLET | Freq: Every day | ORAL | Status: DC
  Administered 2013-08-10 – 2013-08-14 (×5): 40 mg via ORAL
  Filled 2013-08-10 (×5): qty 1

## 2013-08-10 MED ORDER — POTASSIUM CHLORIDE ER 10 MEQ PO TBCR
10.0000 meq | EXTENDED_RELEASE_TABLET | Freq: Three times a day (TID) | ORAL | Status: DC
  Administered 2013-08-10 – 2013-08-14 (×11): 10 meq via ORAL
  Filled 2013-08-10 (×15): qty 1

## 2013-08-10 MED ORDER — ACETAMINOPHEN 650 MG RE SUPP: 650.0000 mg | Freq: Four times a day (QID) | RECTAL | Status: DC | PRN

## 2013-08-10 MED ORDER — ACETAMINOPHEN 325 MG PO TABS: 650.0000 mg | ORAL_TABLET | Freq: Four times a day (QID) | ORAL | Status: DC | PRN

## 2013-08-10 MED ORDER — TRIAMCINOLONE ACETONIDE 0.1 % EX CREA
1.0000 "application " | TOPICAL_CREAM | Freq: Two times a day (BID) | CUTANEOUS | Status: DC | PRN
  Administered 2013-08-10 – 2013-08-14 (×5): 1 via TOPICAL
  Filled 2013-08-10: qty 15

## 2013-08-10 MED ORDER — ATENOLOL 25 MG PO TABS
25.0000 mg | ORAL_TABLET | Freq: Every day | ORAL | Status: DC
  Administered 2013-08-10 – 2013-08-14 (×5): 25 mg via ORAL
  Filled 2013-08-10 (×5): qty 1

## 2013-08-10 MED ORDER — LOPERAMIDE HCL 2 MG PO CAPS: 2.0000 mg | ORAL_CAPSULE | ORAL | Status: DC | PRN

## 2013-08-10 MED ORDER — PRENATAL 27-0.8 MG PO TABS
1.0000 | ORAL_TABLET | Freq: Every day | ORAL | Status: DC
  Administered 2013-08-10 – 2013-08-14 (×5): 1 via ORAL
  Filled 2013-08-10 (×5): qty 1

## 2013-08-10 MED ORDER — PANTOPRAZOLE SODIUM 40 MG IV SOLR
40.0000 mg | Freq: Two times a day (BID) | INTRAVENOUS | Status: DC
  Administered 2013-08-10 – 2013-08-14 (×8): 40 mg via INTRAVENOUS
  Filled 2013-08-10 (×9): qty 40

## 2013-08-10 MED ORDER — POLYVINYL ALCOHOL 1.4 % OP SOLN
1.0000 [drp] | Freq: Three times a day (TID) | OPHTHALMIC | Status: DC | PRN
  Filled 2013-08-10: qty 15

## 2013-08-10 MED ORDER — BIOTENE DRY MOUTH MT LIQD
15.0000 mL | Freq: Two times a day (BID) | OROMUCOSAL | Status: DC
  Administered 2013-08-11 – 2013-08-13 (×5): 15 mL via OROMUCOSAL

## 2013-08-10 MED ORDER — KETOCONAZOLE 2 % EX CREA
1.0000 "application " | TOPICAL_CREAM | Freq: Two times a day (BID) | CUTANEOUS | Status: DC | PRN
  Filled 2013-08-10: qty 15

## 2013-08-10 MED ORDER — FUROSEMIDE 40 MG PO TABS
40.0000 mg | ORAL_TABLET | Freq: Every day | ORAL | Status: DC
  Administered 2013-08-11 – 2013-08-14 (×4): 40 mg via ORAL
  Filled 2013-08-10 (×4): qty 1

## 2013-08-10 MED ORDER — IOHEXOL 300 MG/ML  SOLN
100.0000 mL | Freq: Once | INTRAMUSCULAR | Status: AC | PRN
  Administered 2013-08-10: 100 mL via INTRAVENOUS

## 2013-08-10 MED ORDER — ISOSORBIDE MONONITRATE ER 30 MG PO TB24
30.0000 mg | ORAL_TABLET | Freq: Every day | ORAL | Status: DC
  Administered 2013-08-10 – 2013-08-14 (×5): 30 mg via ORAL
  Filled 2013-08-10 (×5): qty 1

## 2013-08-10 MED ORDER — ONDANSETRON HCL 4 MG PO TABS: 4.0000 mg | ORAL_TABLET | Freq: Four times a day (QID) | ORAL | Status: DC | PRN

## 2013-08-10 MED ORDER — POLYETHYL GLYCOL-PROPYL GLYCOL 0.4-0.3 % OP SOLN: 1.0000 [drp] | Freq: Three times a day (TID) | OPHTHALMIC | Status: DC | PRN

## 2013-08-10 MED ORDER — CHLORHEXIDINE GLUCONATE 0.12 % MT SOLN
15.0000 mL | Freq: Two times a day (BID) | OROMUCOSAL | Status: DC
  Administered 2013-08-10 – 2013-08-14 (×7): 15 mL via OROMUCOSAL
  Filled 2013-08-10 (×10): qty 15

## 2013-08-10 MED ORDER — MECLIZINE HCL 25 MG PO TABS
25.0000 mg | ORAL_TABLET | Freq: Two times a day (BID) | ORAL | Status: DC | PRN
  Filled 2013-08-10: qty 1

## 2013-08-10 MED ORDER — ESTROGENS CONJUGATED 0.3 MG PO TABS
0.3000 mg | ORAL_TABLET | Freq: Every day | ORAL | Status: DC
  Administered 2013-08-10 – 2013-08-14 (×5): 0.3 mg via ORAL
  Filled 2013-08-10 (×5): qty 1

## 2013-08-10 MED ORDER — FUROSEMIDE 40 MG PO TABS
40.0000 mg | ORAL_TABLET | Freq: Every day | ORAL | Status: DC
  Filled 2013-08-10: qty 1

## 2013-08-10 NOTE — Progress Notes (Signed)
  CARE MANAGEMENT ED NOTE 08/10/2013  Patient:  LILLYROSE, REITAN   Account Number:  [id number]  Date Initiated:  08/10/2013  Documentation initiated by:  Livia Snellen  Subjective/Objective Assessment:   Patient presents to Ed with rectal bleeding and abdominal pian     Subjective/Objective Assessment Detail:     Action/Plan:   Action/Plan Detail:   Anticipated DC Date:       Status Recommendation to Physician:   Result of Recommendation:    Other ED [facility]  Other  PCP issues    Choice offered to / List presented to:            Status of service:  Completed, signed off  ED Comments:   ED Comments Detail:  EDCM spoke to patient at bedside.  Patient reports she lives at home with a caregiver.  Patient's caregiver is at her house Jory Sims Friday for four hours and Tues and Thurs for three hours.  Patient's caregiver assists patient with her meals.  Patient reports she is able to  perform her ADL's and is able to ambulate without difficulty with her cane or walker.  Patinet has a shower chair and a wheelchair as well at home.  Patient has never had home health before.  EDCM provided patient with a list of home healt agencies in [facility].  EDCM explained to patient that with home health, she may receive a visiting RN, PT, OT aide and social worker if needed.  Also provided patient with list of private duty nursing agencies nad explained it would be an out of pocket expense for her. Patient thankful for resources.  No further EDCM needs at this time.

## 2013-08-10 NOTE — ED Provider Notes (Signed)
CSN: [id number]     Arrival date & time 08/10/13  1113 History   First MD Initiated Contact with Patient 08/10/13 1201     Chief Complaint  Patient presents with  . Rectal Bleeding     (Consider location/radiation/quality/duration/timing/severity/associated sxs/prior Treatment) HPI Patient presents to the emergency department with rectal bleeding.  The patient, states, that she is having rectal bleeding for quite a while, but seemed a little bit worse today.  She called her GI Dr. advised her to come to the hospital for evaluation.  Patient, states, that she's not had any weakness, dizziness, blurred vision, headache, back pain, neck pain, chest pain, shortness of breath, nausea, vomiting rash fever, increased frequency of urination, dysuria, or syncope.  Patient, states, that she's had no lightheadedness, or dizziness.  Patient, states, that seems make her condition, better or worse Past Medical History  Diagnosis Date  . Hyperglycemia   . CAD (coronary artery disease)   . Pneumonia     july 2009  . Hyperlipidemia   . Hormone replacement therapy (postmenopausal)   . Diverticulosis   . DJD (degenerative joint disease) of lumbar spine     scoliosis  . DJD (degenerative joint disease) of knee   . Sciatica     right lower extremity  . Allergic rhinitis   . Reactive depression (situational)   . Shingles     right thoracic 2008  . Eczema   . GI bleed   . Diverticular disease   . Esophageal erosions     from nsaid   . Cough   . Orthopnea   . Vertigo   . CHF (congestive heart failure)   . Hypertension    Past Surgical History  Procedure Laterality Date  . Back surgery    . Total abdominal hysterectomy    . Breast lumpectomy    . Appendectomy     Family History  Problem Relation Age of Onset  . Heart disease Daughter   . Heart disease Son   . Heart disease Father   . Heart disease Mother   . Cancer Brother   . Heart disease Sister    History  Substance Use Topics  .  Smoking status: Never Smoker   . Smokeless tobacco: Never Used  . Alcohol Use: Yes   OB History   Grav Para Term Preterm Abortions TAB SAB Ect Mult Living                 Review of Systems  All other systems negative except as documented in the HPI. All pertinent positives and negatives as reviewed in the HPI.  Allergies  Ace inhibitors; Penicillins; Streptomycin; and Tramadol  Home Medications   Prior to Admission medications   Medication Sig Start Date End Date Taking? Authorizing Provider  aspirin EC 81 MG tablet Take 81 mg by mouth every Monday, Wednesday, and Friday.   Yes Historical Provider, MD  atenolol (TENORMIN) 25 MG tablet Take 25 mg by mouth daily.   Yes Historical Provider, MD  citalopram (CELEXA) 40 MG tablet Take 40 mg by mouth daily.     Yes Historical Provider, MD  clotrimazole (LOTRIMIN) 1 % cream Apply 1 application topically 2 (two) times daily.   Yes Historical Provider, MD  estrogens, conjugated, (PREMARIN) 0.3 MG tablet Take 0.3 mg by mouth daily.    Yes Historical Provider, MD  furosemide (LASIX) 40 MG tablet Take 40 mg by mouth daily.    Yes Historical Provider, MD  isosorbide mononitrate (  IMDUR) 30 MG 24 hr tablet Take 1 tablet (30 mg total) by mouth daily. 01/08/13  Yes Jettie Booze, MD  ketoconazole (NIZORAL) 2 % cream Apply 1 application topically 2 (two) times daily as needed for irritation.   Yes Historical Provider, MD  loperamide (AF-LOPERAMIDE HCL) 2 MG tablet Take 2 mg by mouth as needed for diarrhea or loose stools. 1 capsule up to 8 tablets per 24hour 8 times a day   Yes Historical Provider, MD  meclizine (ANTIVERT) 25 MG tablet Take 25 mg by mouth 2 (two) times daily as needed for dizziness or nausea.    Yes Historical Provider, MD  Polyethyl Glycol-Propyl Glycol (SYSTANE) 0.4-0.3 % SOLN Apply 1 drop to eye 3 (three) times daily as needed (dry eyes).   Yes Historical Provider, MD  potassium chloride (KLOR-CON) 10 MEQ CR tablet Take 10 mEq  by mouth 3 (three) times daily.    Yes Historical Provider, MD  Prenatal Vit-Fe Fumarate-FA (MULTIVITAMIN-PRENATAL) 27-0.8 MG TABS Take 1 tablet by mouth daily.     Yes Historical Provider, MD  triamcinolone cream (KENALOG) 0.1 % Apply 1 application topically 2 (two) times daily as needed (rash).    Yes Historical Provider, MD   BP 149/69  Pulse 74  Temp(Src) 98.1 F (36.7 C) (Oral)  Resp 16  SpO2 94% Physical Exam  Nursing note and vitals reviewed. Constitutional: She is oriented to person, place, and time. She appears well-developed and well-nourished. No distress.  HENT:  Head: Normocephalic and atraumatic.  Mouth/Throat: Oropharynx is clear and moist.  Eyes: Pupils are equal, round, and reactive to light.  Neck: Normal range of motion. Neck supple.  Cardiovascular: Normal rate, regular rhythm and normal heart sounds.  Exam reveals no gallop and no friction rub.   No murmur heard. Pulmonary/Chest: Effort normal and breath sounds normal. No respiratory distress.  Abdominal: Soft. Bowel sounds are normal. She exhibits no distension. There is tenderness. There is no guarding.  Neurological: She is alert and oriented to person, place, and time. She exhibits normal muscle tone. Coordination normal.  Skin: Skin is warm and dry. No rash noted. No erythema.    ED Course  Procedures (including critical care time) Labs Review Labs Reviewed  CBC WITH DIFFERENTIAL - Abnormal; Notable for the following:    HCT 35.9 (*)    Platelets 121 (*)    Monocytes Relative 13 (*)    All other components within normal limits  COMPREHENSIVE METABOLIC PANEL - Abnormal; Notable for the following:    Creatinine, Ser 1.11 (*)    GFR calc non Af Amer 41 (*)    GFR calc Af Amer 48 (*)    Anion gap 16 (*)    All other components within normal limits  URINALYSIS, ROUTINE W REFLEX MICROSCOPIC - Abnormal; Notable for the following:    Hgb urine dipstick TRACE (*)    All other components within normal limits   POC OCCULT BLOOD, ED - Abnormal; Notable for the following:    Fecal Occult Bld POSITIVE (*)    All other components within normal limits  URINE MICROSCOPIC-ADD ON  I-STAT CG4 LACTIC ACID, ED  POC OCCULT BLOOD, ED    Imaging Review Ct Abdomen Pelvis W Contrast  08/10/2013   CLINICAL DATA:  Bright red blood per rectum, abdominal pain  EXAM: CT ABDOMEN AND PELVIS WITH CONTRAST  TECHNIQUE: Multidetector CT imaging of the abdomen and pelvis was performed using the standard protocol following bolus administration of intravenous contrast.  CONTRAST:  2mL OMNIPAQUE IOHEXOL 300 MG/ML SOLN, 130mL OMNIPAQUE IOHEXOL 300 MG/ML SOLN  COMPARISON:  07/05/2013  FINDINGS: The lung bases are free of acute infiltrate or sizable effusion. A large hiatal hernia is again identified.  The liver, spleen, gallbladder, adrenal glands and pancreas are normal in their CT appearance. Prominence of the common bile duct is again identified and stable. This likely within normal limits given the patient's age.  The kidneys are well visualized and demonstrate a normal enhancement pattern. Small cystic lesions are noted bilaterally. No obstructive changes are seen. No calculi are noted.  Significant colonic diverticulosis is noted. No findings to suggest diverticulitis are seen. No focal mass lesion is seen. The bladder is well distended. No pelvic mass lesion is seen. The uterus has been surgically removed. The appendix is been surgically removed. Significant degenerative changes of the lumbar spine are again noted.  IMPRESSION: Chronic changes as described above similar this scan of the previous month. No acute abnormality is identified.   Electronically Signed   By: Inez Catalina M.D.   On: 08/10/2013 14:02    She has been stable here in the emergency department.  CT scan was negative.  Feel that the patient is stable for discharge.  Will be advised followup with Dr. Amedeo Plenty, who will speak with who is on call for Dr. Amedeo Plenty about the  patient her lab testing as been stable compared to previous.  Is not currently actively bleeding patient feels weak and dizzy and does not feel like she can go home.  I spoke with GI and they stated that she was admitted today and they will consult, as needed         Brent General, PA-C 08/10/13 1653

## 2013-08-10 NOTE — ED Notes (Signed)
Pt ambulated to the restroom without staff assist.

## 2013-08-10 NOTE — ED Notes (Addendum)
Pt c/o rectal bleeding and intermittent lower abdominal discomfort x "a long time."  Denies pain.  Pt sts that bright red bleeding increased last night  Pt was seen at [facility] in June 2015 and diagnosed w/ diverticulitis.  Pt is now followed by Morgan Plenty MD and was directed by him to come to ED.

## 2013-08-10 NOTE — H&P (Signed)
[facility] History and Physical  [name] QQP:[id number] DOB: 1916-05-07 DOA: 08/10/2013  Referring physician: ER physician PCP: Henrine Screws, MD   Chief Complaint: rectal bleed  HPI:  80 year old female with history of hypertension, CAD who presented to [facility] ED 08/10/2013 with complaints of rectal bleed for few days now but progressively worse since past 24 hours. Patient also reported associated some dizziness, lightheadedness but no loss of consciousness. No nausea or vomiting. No diarrhea. No reports of fevers or chills. No chest pain, shortness of breath or palpitations.  In ED, BP was 135/71, HR 69, T max 98.1 F and oxygen saturation was 98% on room air. Blood work was essentially unremarkable except for slight elevation in creatinine which has subsequently normalized. CT abdomen was unremarkable, no acute findings.    Assessment & Plan    Principal Problem:   Rectal bleed, painless - unclear etiology, possibly diverticular bleed; no acute findings on CT abdomen - follow up FOBT - hemoglobin is stable, WNL - started protonix 40 mg IV Q 12 hours - monitor for bleed - hold aspirin  Active Problems:   Acute renal failure - likely dehydration  - continue IV fluids for next 24 hours - follow up BMP showed resolution of acute renal failure   Essential hypertension, benign - restart atenolol, imdur, lasix  DVT prophylaxis: SCD's for DVT prophylaxis due to risk of bleed  Radiological Exams on Admission: Ct Abdomen Pelvis W Contrast  08/10/2013  IMPRESSION: Chronic changes as described above similar this scan of the previous month. No acute abnormality is identified.      Code Status: Full Family Communication: Plan of care discussed with the patient  Disposition Plan: Admit for further evaluation  Leisa Lenz, MD  [facility] Pager [phone]  Review of Systems:  Constitutional: Negative for fever, chills and malaise/fatigue. Negative for  diaphoresis.  HENT: Negative for hearing loss, ear pain, nosebleeds, congestion, sore throat, neck pain, tinnitus and ear discharge.   Eyes: Negative for blurred vision, double vision, photophobia, pain, discharge and redness.  Respiratory: Negative for cough, hemoptysis, sputum production, shortness of breath, wheezing and stridor.   Cardiovascular: Negative for chest pain, palpitations, orthopnea, claudication and leg swelling.  Gastrointestinal: per HPI.  Genitourinary: Negative for dysuria, urgency, frequency, hematuria and flank pain.  Musculoskeletal: Negative for myalgias, back pain, joint pain and falls.  Skin: Negative for itching and rash.  Neurological: Negative for dizziness and weakness. Negative for tingling, tremors, sensory change, speech change, focal weakness, loss of consciousness and headaches.  Endo/Heme/Allergies: Negative for environmental allergies and polydipsia. Does not bruise/bleed easily.  Psychiatric/Behavioral: Negative for suicidal ideas. The patient is not nervous/anxious.      Past Medical History  Diagnosis Date  . Hyperglycemia   . CAD (coronary artery disease)   . Pneumonia     july 2009  . Hyperlipidemia   . Hormone replacement therapy (postmenopausal)   . Diverticulosis   . DJD (degenerative joint disease) of lumbar spine     scoliosis  . DJD (degenerative joint disease) of knee   . Sciatica     right lower extremity  . Allergic rhinitis   . Reactive depression (situational)   . Shingles     right thoracic 2008  . Eczema   . GI bleed   . Diverticular disease   . Esophageal erosions     from nsaid   . Cough   . Orthopnea   . Vertigo   . CHF (congestive  heart failure)   . Hypertension    Past Surgical History  Procedure Laterality Date  . Back surgery    . Total abdominal hysterectomy    . Breast lumpectomy    . Appendectomy     Social History:  reports that she has never smoked. She has never used smokeless tobacco. She reports  that she drinks alcohol. She reports that she does not use illicit drugs.  Allergies  Allergen Reactions  . Ace Inhibitors Hives and Itching  . Penicillins     Pruritic rash  . Streptomycin     Pruritus   . Tramadol     unknown    Family History:  Family History  Problem Relation Age of Onset  . Heart disease Daughter   . Heart disease Son   . Heart disease Father   . Heart disease Mother   . Cancer Brother   . Heart disease Sister      Prior to Admission medications   Medication Sig Start Date End Date Taking? Authorizing Provider  aspirin EC 81 MG tablet Take 81 mg by mouth every Monday, Wednesday, and Friday.   Yes Historical Provider, MD  atenolol (TENORMIN) 25 MG tablet Take 25 mg by mouth daily.   Yes Historical Provider, MD  citalopram (CELEXA) 40 MG tablet Take 40 mg by mouth daily.     Yes Historical Provider, MD  clotrimazole (LOTRIMIN) 1 % cream Apply 1 application topically 2 (two) times daily.   Yes Historical Provider, MD  estrogens, conjugated, (PREMARIN) 0.3 MG tablet Take 0.3 mg by mouth daily.    Yes Historical Provider, MD  furosemide (LASIX) 40 MG tablet Take 40 mg by mouth daily.    Yes Historical Provider, MD  isosorbide mononitrate (IMDUR) 30 MG 24 hr tablet Take 1 tablet (30 mg total) by mouth daily. 01/08/13  Yes Jettie Booze, MD  ketoconazole (NIZORAL) 2 % cream Apply 1 application topically 2 (two) times daily as needed for irritation.   Yes Historical Provider, MD  loperamide (AF-LOPERAMIDE HCL) 2 MG tablet Take 2 mg by mouth as needed for diarrhea or loose stools. 1 capsule up to 8 tablets per 24hour 8 times a day   Yes Historical Provider, MD  meclizine (ANTIVERT) 25 MG tablet Take 25 mg by mouth 2 (two) times daily as needed for dizziness or nausea.    Yes Historical Provider, MD  Polyethyl Glycol-Propyl Glycol (SYSTANE) 0.4-0.3 % SOLN Apply 1 drop to eye 3 (three) times daily as needed (dry eyes).   Yes Historical Provider, MD  potassium  chloride (KLOR-CON) 10 MEQ CR tablet Take 10 mEq by mouth 3 (three) times daily.    Yes Historical Provider, MD  Prenatal Vit-Fe Fumarate-FA (MULTIVITAMIN-PRENATAL) 27-0.8 MG TABS Take 1 tablet by mouth daily.     Yes Historical Provider, MD  triamcinolone cream (KENALOG) 0.1 % Apply 1 application topically 2 (two) times daily as needed (rash).    Yes Historical Provider, MD   Physical Exam: Filed Vitals:   08/10/13 1500 08/10/13 1600 08/10/13 1707 08/10/13 1723  BP: 137/68 141/61 143/79   Pulse: 70 69 91   Temp:    97.6 F (36.4 C)  TempSrc:    Oral  Resp:   22   SpO2: 98% 96% 96%     Physical Exam  Constitutional: Appears well-developed and well-nourished. No distress.  HENT: Normocephalic. No tonsillar erythema or exudates Eyes: Conjunctivae and EOM are normal. PERRLA, no scleral icterus.  Neck: Normal ROM.  Neck supple. No JVD. No tracheal deviation. No thyromegaly.  CVS: RRR, S1/S2 appreciated; (+) 2/6 SEM, non radiating  Pulmonary: Effort and breath sounds normal, no stridor, rhonchi, wheezes, rales.  Abdominal: Soft. BS +,  no distension, tenderness, rebound or guarding.  Musculoskeletal: Normal range of motion. No edema and no tenderness.  Lymphadenopathy: No lymphadenopathy noted, cervical, inguinal. Neuro: Alert. Normal reflexes, muscle tone coordination. No focal neurologic deficits. Skin: Skin is warm and dry. No rash noted. Not diaphoretic.   Psychiatric: Normal mood and affect. Behavior, judgment, thought content normal.   Labs on Admission:  Basic Metabolic Panel:  Recent Labs Lab 08/06/13 1217 08/10/13 1249  NA 136 142  K 3.6 4.1  CL 101 101  CO2 23 25  GLUCOSE 134* 84  BUN 15 16  CREATININE 1.3* 1.11*  CALCIUM 9.1 9.7   Liver Function Tests:  Recent Labs Lab 08/10/13 1249  AST 19  ALT 8  ALKPHOS 62  BILITOT 0.3  PROT 7.3  ALBUMIN 3.6   No results found for this basename: LIPASE, AMYLASE,  in the last 168 hours No results found for this  basename: AMMONIA,  in the last 168 hours CBC:  Recent Labs Lab 08/10/13 1249  WBC 6.6  NEUTROABS 3.7  HGB 12.2  HCT 35.9*  MCV 91.6  PLT 121*   Cardiac Enzymes: No results found for this basename: CKTOTAL, CKMB, CKMBINDEX, TROPONINI,  in the last 168 hours BNP: No components found with this basename: POCBNP,  CBG: No results found for this basename: GLUCAP,  in the last 168 hours  If 7PM-7AM, please contact night-coverage [email or web address] Password [facility] 08/10/2013, 5:47 PM

## 2013-08-11 LAB — COMPREHENSIVE METABOLIC PANEL
ALT: 9 U/L (ref 0–35)
AST: 17 U/L (ref 0–37)
Albumin: 3 g/dL — ABNORMAL LOW (ref 3.5–5.2)
Alkaline Phosphatase: 55 U/L (ref 39–117)
Anion gap: 11 (ref 5–15)
BUN: 15 mg/dL (ref 6–23)
CALCIUM: 9.6 mg/dL (ref 8.4–10.5)
CO2: 27 mEq/L (ref 19–32)
Chloride: 102 mEq/L (ref 96–112)
Creatinine, Ser: 1.18 mg/dL — ABNORMAL HIGH (ref 0.50–1.10)
GFR calc Af Amer: 44 mL/min — ABNORMAL LOW (ref >90)
GFR, EST NON AFRICAN AMERICAN: 38 mL/min — AB (ref >90)
Glucose, Bld: 94 mg/dL (ref 70–99)
Potassium: 4.7 mEq/L (ref 3.7–5.3)
Sodium: 140 mEq/L (ref 137–147)
Total Bilirubin: 0.3 mg/dL (ref 0.3–1.2)
Total Protein: 6.4 g/dL (ref 6.0–8.3)

## 2013-08-11 LAB — CBC
HCT: 33 % — ABNORMAL LOW (ref 36.0–46.0)
HCT: 36.4 % (ref 36.0–46.0)
Hemoglobin: 11.1 g/dL — ABNORMAL LOW (ref 12.0–15.0)
Hemoglobin: 12 g/dL (ref 12.0–15.0)
MCH: 31.1 pg (ref 26.0–34.0)
MCH: 30.6 pg (ref 26.0–34.0)
MCHC: 33.6 g/dL (ref 30.0–36.0)
MCHC: 33 g/dL (ref 30.0–36.0)
MCV: 92.4 fL (ref 78.0–100.0)
MCV: 92.9 fL (ref 78.0–100.0)
PLATELETS: 140 10*3/uL — AB (ref 150–400)
PLATELETS: 143 10*3/uL — AB (ref 150–400)
RBC: 3.57 MIL/uL — AB (ref 3.87–5.11)
RBC: 3.92 MIL/uL (ref 3.87–5.11)
RDW: 13.3 % (ref 11.5–15.5)
RDW: 13.4 % (ref 11.5–15.5)
WBC: 6.1 10*3/uL (ref 4.0–10.5)
WBC: 6.2 10*3/uL (ref 4.0–10.5)

## 2013-08-11 LAB — GLUCOSE, CAPILLARY: GLUCOSE-CAPILLARY: 97 mg/dL (ref 70–99)

## 2013-08-11 MED ORDER — BOOST / RESOURCE BREEZE PO LIQD
1.0000 | ORAL | Status: DC
  Administered 2013-08-11: 1 via ORAL

## 2013-08-11 NOTE — Care Management Note (Signed)
    Page 1 of 1   08/11/2013     1:59:52 PM CARE MANAGEMENT NOTE 08/11/2013  Patient:  [name], [name]   Account Number:  [id number]  Date Initiated:  08/11/2013  Documentation initiated by:  Mariane Masters  Subjective/Objective Assessment:   adm: rectal bleed     Action/Plan:   discharge planning   Anticipated DC Date:  08/11/2013   Anticipated DC Plan:  [place]  CM consult      Choice offered to / List presented to:             Status of service:  Completed, signed off Medicare Important Message given?   (If response is "NO", the following Medicare IM given date fields will be blank) Date Medicare IM given:   Medicare IM given by:   Date Additional Medicare IM given:   Additional Medicare IM given by:    Discharge Disposition:  HOME/SELF CARE  Per UR Regulation:  Reviewed for med. necessity/level of care/duration of stay  If discussed at [place] of Stay Meetings, dates discussed:    Comments:  08/11/13 13:50 CM met with pt in room to discharge needs. Pt states she has a private caregiver who comes each day of the week for 3-4 hours.  Pt states at this time she does not need any home health agency.  CM will continue to follow for disposition needs.  Mariane Masters, BSN, CM [phone].

## 2013-08-11 NOTE — Progress Notes (Signed)
INITIAL NUTRITION ASSESSMENT  DOCUMENTATION CODES Per approved criteria  -Not Applicable   INTERVENTION: - Recommend Resource Breeze once daily - Diet advancement per MD - Will continue to monitor  NUTRITION DIAGNOSIS: Inadequate oral intake related to loss of taste as evidenced by decreased appetite for 2 months.   Goal: Pt to meet >/= 90% of their estimated nutrition needs    Monitor:  Diet order, total protein/energy intake, labs, weights, GI profile  Reason for Assessment: MST  80 y.o. female  Admitting Dx: Rectal bleed  ASSESSMENT: 80 year old female with history of hypertension, CAD who presented to [facility] ED 08/10/2013 with complaints of rectal bleed for few days now but progressively worse since past 24 hours. Patient also reported associated some dizziness, lightheadedness but no loss of consciousness  -Pt reported decreased PO intake for past 2 months d/t loss of taste and general disinterest in eating -Diet recall indicates pt consuming 2-3 meals/day with assistance from her caregiver. Breakfast consists of either bacon/eggs or cereals, lunch is typically a sandwich and dinner will be made up of chicken or pork with vegetables -Endorsed a small weight loss, likely 2-3 lbs and felt a generalized weakness -Pt w/50% PO intake, denied any n/v post meals -On CL diet d/t rectal bleeding. Will order Resource Breeze once daily for supplementation and assist with hydration as pt with ARF  - Nutrition focused physical exam performed.  No signs of muscle wasting or subcutaneous fat loss  Height: Ht Readings from Last 1 Encounters:  08/10/13 5\' 5"  (1.651 m)    Weight: Wt Readings from Last 1 Encounters:  08/10/13 157 lb (71.215 kg)    Ideal Body Weight: 125 lbs  % Ideal Body Weight: 126%  Wt Readings from Last 10 Encounters:  08/10/13 157 lb (71.215 kg)  07/23/13 157 lb (71.215 kg)  07/05/13 156 lb (70.761 kg)  01/08/13 157 lb (71.215 kg)  12/26/10 156 lb 12.8 oz  (71.124 kg)    Usual Body Weight: 159 lbs  % Usual Body Weight: 99%  BMI:  Body mass index is 26.13 kg/(m^2).  Estimated Nutritional Needs: Kcal: 1500-1700 Protein: 70-80 gram Fluid: >= 1700 ml/daily  Skin: WDL  Diet Order: Clear Liquid  EDUCATION NEEDS: -No education needs identified at this time   Intake/Output Summary (Last 24 hours) at 08/11/13 1214 Last data filed at 08/11/13 1059  Gross per 24 hour  Intake    600 ml  Output    200 ml  Net    400 ml    Last BM: pta   Labs:   Recent Labs Lab 08/10/13 1249 08/10/13 1809 08/11/13 0433  NA 142 139 140  K 4.1 4.5 4.7  CL 101 97 102  CO2 25 27 27   BUN 16 15 15   CREATININE 1.11* 1.01 1.18*  CALCIUM 9.7 9.8 9.6  MG  --  1.6  --   PHOS  --  2.8  --   GLUCOSE 84 85 94    CBG (last 3)   Recent Labs  08/11/13 0806  GLUCAP 97    Scheduled Meds: . antiseptic oral rinse  15 mL Mouth Rinse q12n4p  . atenolol  25 mg Oral Daily  . chlorhexidine  15 mL Mouth Rinse BID  . citalopram  40 mg Oral Daily  . clotrimazole  1 application Topical BID  . estrogens (conjugated)  0.3 mg Oral Daily  . feeding supplement (RESOURCE BREEZE)  1 Container Oral Q24H  . furosemide  40 mg  Oral Daily  . isosorbide mononitrate  30 mg Oral Daily  . multivitamin-prenatal  1 tablet Oral Daily  . pantoprazole (PROTONIX) IV  40 mg Intravenous Q12H  . potassium chloride  10 mEq Oral TID    Continuous Infusions: . sodium chloride 50 mL/hr at 08/11/13 0730    Past Medical History  Diagnosis Date  . Hyperglycemia   . CAD (coronary artery disease)   . Pneumonia     july 2009  . Hyperlipidemia   . Hormone replacement therapy (postmenopausal)   . Diverticulosis   . DJD (degenerative joint disease) of lumbar spine     scoliosis  . DJD (degenerative joint disease) of knee   . Sciatica     right lower extremity  . Allergic rhinitis   . Reactive depression (situational)   . Shingles     right thoracic 2008  . Eczema   .  GI bleed   . Diverticular disease   . Esophageal erosions     from nsaid   . Cough   . Orthopnea   . Vertigo   . CHF (congestive heart failure)   . Hypertension     Past Surgical History  Procedure Laterality Date  . Back surgery    . Total abdominal hysterectomy    . Breast lumpectomy    . Appendectomy      [place] Clinical Dietitian [phone]

## 2013-08-11 NOTE — Consult Note (Signed)
[facility] Consultation Note  Referring Provider: Dr. Viviana Simpler [facility]) Primary Care Physician:  Henrine Screws, MD Primary Gastroenterologist:  Dr. Teena Irani  Reason for Consultation:  hematochezia  HPI: [name] is a 80 y.o. female presenting for hematochezia.  Had had several similar episodes over the years, attributed to diverticulosis.  Some lower abdominal crampy discomfort.  Has intermittent bouts of large volume hematochezia with typically quick resolution, but her current flare persisted.  CT showed pancolonic diverticulosis without diverticulitis, faint right colonic thickening (chronic finding).  Had colonoscopy in 2010 showing diverticulosis, otherwise unrevealing.   Past Medical History  Diagnosis Date  . Hyperglycemia   . CAD (coronary artery disease)   . Pneumonia     july 2009  . Hyperlipidemia   . Hormone replacement therapy (postmenopausal)   . Diverticulosis   . DJD (degenerative joint disease) of lumbar spine     scoliosis  . DJD (degenerative joint disease) of knee   . Sciatica     right lower extremity  . Allergic rhinitis   . Reactive depression (situational)   . Shingles     right thoracic 2008  . Eczema   . GI bleed   . Diverticular disease   . Esophageal erosions     from nsaid   . Cough   . Orthopnea   . Vertigo   . CHF (congestive heart failure)   . Hypertension     Past Surgical History  Procedure Laterality Date  . Back surgery    . Total abdominal hysterectomy    . Breast lumpectomy    . Appendectomy      Prior to Admission medications   Medication Sig Start Date End Date Taking? Authorizing Provider  aspirin EC 81 MG tablet Take 81 mg by mouth every Monday, Wednesday, and Friday.   Yes Historical Provider, MD  atenolol (TENORMIN) 25 MG tablet Take 25 mg by mouth daily.   Yes Historical Provider, MD  citalopram (CELEXA) 40 MG tablet Take 40 mg by mouth daily.     Yes Historical Provider,  MD  clotrimazole (LOTRIMIN) 1 % cream Apply 1 application topically 2 (two) times daily.   Yes Historical Provider, MD  estrogens, conjugated, (PREMARIN) 0.3 MG tablet Take 0.3 mg by mouth daily.    Yes Historical Provider, MD  furosemide (LASIX) 40 MG tablet Take 40 mg by mouth daily.    Yes Historical Provider, MD  isosorbide mononitrate (IMDUR) 30 MG 24 hr tablet Take 1 tablet (30 mg total) by mouth daily. 01/08/13  Yes Jettie Booze, MD  ketoconazole (NIZORAL) 2 % cream Apply 1 application topically 2 (two) times daily as needed for irritation.   Yes Historical Provider, MD  loperamide (AF-LOPERAMIDE HCL) 2 MG tablet Take 2 mg by mouth as needed for diarrhea or loose stools. 1 capsule up to 8 tablets per 24hour 8 times a day   Yes Historical Provider, MD  meclizine (ANTIVERT) 25 MG tablet Take 25 mg by mouth 2 (two) times daily as needed for dizziness or nausea.    Yes Historical Provider, MD  Polyethyl Glycol-Propyl Glycol (SYSTANE) 0.4-0.3 % SOLN Apply 1 drop to eye 3 (three) times daily as needed (dry eyes).   Yes Historical Provider, MD  potassium chloride (KLOR-CON) 10 MEQ CR tablet Take 10 mEq by mouth 3 (three) times daily.    Yes Historical Provider, MD  Prenatal Vit-Fe Fumarate-FA (MULTIVITAMIN-PRENATAL) 27-0.8 MG TABS Take 1 tablet by mouth daily.  Yes Historical Provider, MD  triamcinolone cream (KENALOG) 0.1 % Apply 1 application topically 2 (two) times daily as needed (rash).    Yes Historical Provider, MD    Current Facility-Administered Medications  Medication Dose Route Frequency Provider Last Rate Last Dose  . 0.9 %  sodium chloride infusion   Intravenous Continuous Josetta Huddle, MD 75 mL/hr at 08/10/13 1833    . acetaminophen (TYLENOL) tablet 650 mg  650 mg Oral Q6H PRN Robbie Lis, MD       Or  . acetaminophen (TYLENOL) suppository 650 mg  650 mg Rectal Q6H PRN Robbie Lis, MD      . antiseptic oral rinse (BIOTENE) solution 15 mL  15 mL Mouth Rinse q12n4p Robbie Lis, MD      . atenolol (TENORMIN) tablet 25 mg  25 mg Oral Daily Robbie Lis, MD   25 mg at 08/10/13 2144  . chlorhexidine (PERIDEX) 0.12 % solution 15 mL  15 mL Mouth Rinse BID Robbie Lis, MD   15 mL at 08/10/13 2148  . citalopram (CELEXA) tablet 40 mg  40 mg Oral Daily Robbie Lis, MD   40 mg at 08/10/13 2138  . clotrimazole (LOTRIMIN) 1 % cream 1 application  1 application Topical BID Robbie Lis, MD      . estrogens (conjugated) (PREMARIN) tablet 0.3 mg  0.3 mg Oral Daily Robbie Lis, MD   0.3 mg at 08/10/13 2136  . feeding supplement (RESOURCE BREEZE) (RESOURCE BREEZE) liquid 1 Container  1 Container Oral Q24H Hazle Coca, RD      . furosemide (LASIX) tablet 40 mg  40 mg Oral Daily Robbie Lis, MD      . isosorbide mononitrate (IMDUR) 24 hr tablet 30 mg  30 mg Oral Daily Robbie Lis, MD   30 mg at 08/10/13 2137  . ketoconazole (NIZORAL) 2 % cream 1 application  1 application Topical BID PRN Robbie Lis, MD      . loperamide (IMODIUM) capsule 2 mg  2 mg Oral PRN Robbie Lis, MD      . meclizine (ANTIVERT) tablet 25 mg  25 mg Oral BID PRN Robbie Lis, MD      . multivitamin-prenatal tablet 1 tablet  1 tablet Oral Daily Robbie Lis, MD   1 tablet at 08/10/13 2137  . ondansetron (ZOFRAN) tablet 4 mg  4 mg Oral Q6H PRN Robbie Lis, MD       Or  . ondansetron [facility]) injection 4 mg  4 mg Intravenous Q6H PRN Robbie Lis, MD      . pantoprazole (PROTONIX) injection 40 mg  40 mg Intravenous Q12H Robbie Lis, MD   40 mg at 08/10/13 2139  . polyvinyl alcohol (LIQUIFILM TEARS) 1.4 % ophthalmic solution 1 drop  1 drop Both Eyes TID PRN Robbie Lis, MD      . potassium chloride (K-DUR) CR tablet 10 mEq  10 mEq Oral TID Robbie Lis, MD   10 mEq at 08/10/13 2137  . triamcinolone cream (KENALOG) 0.1 % 1 application  1 application Topical BID PRN Robbie Lis, MD   1 application at 46/96/29 2145    Allergies as of 08/10/2013 - Review Complete 08/10/2013   Allergen Reaction Noted  . Ace inhibitors Hives and Itching 12/25/2010  . Penicillins  12/25/2010  . Streptomycin  12/25/2010  . Tramadol  12/25/2010    Family History  Problem Relation Age of Onset  . Heart disease Daughter   . Heart disease Son   . Heart disease Father   . Heart disease Mother   . Cancer Brother   . Heart disease Sister     History   Social History  . Marital Status: Widowed    Spouse Name: N/A    Number of Children: 0  . Years of Education: N/A   Occupational History  . retired Education officer, [employer]    Social History Main Topics  . Smoking status: Never Smoker   . Smokeless tobacco: Never Used  . Alcohol Use: Yes     Comment: occasional cocktail  . Drug Use: No  . Sexual Activity: Not on file   Other Topics Concern  . Not on file   Social History Narrative   Pt is widowed and lives alone.          Review of Systems: ROS Dr. Charlies Silvers 08/10/13 reviewed and I agree  Physical Exam: Vital signs in last 24 hours: Temp:  [97.6 F (36.4 C)-98.1 F (36.7 C)] 97.6 F (36.4 C) (07/15 0658) Pulse Rate:  [56-93] 56 (07/15 0658) Resp:  [16-22] 20 (07/15 0658) BP: (135-150)/(61-79) 138/66 mmHg (07/15 0658) SpO2:  [94 %-98 %] 96 % (07/15 0658) Weight:  [71.215 kg (157 lb)] 71.215 kg (157 lb) (07/14 1753) Last BM Date: 08/10/13 General:   Alert,  Much younger-appearing than stated age Head:  Normocephalic and atraumatic. Eyes:  Sclera clear, no icterus.   Conjunctiva pink. Ears:  Normal auditory acuity. Nose:  No deformity, discharge,  or lesions. Mouth:  No deformity or lesions.  Oropharynx pink & moist. Neck:  Supple; no masses or thyromegaly. Lungs:  Clear throughout to auscultation.   No wheezes, crackles, or rhonchi. No acute distress. Heart:  Regular rate and rhythm; no murmurs, clicks, rubs,  or gallops. Abdomen:  Soft, nontender and nondistended. No masses, hepatosplenomegaly or hernias noted. Normal bowel sounds, without guarding, and without  rebound.     Msk:  Symmetrical without gross deformities. Normal posture. Pulses:  Normal pulses noted. Extremities:  Without clubbing or edema. Neurologic:  Alert and  oriented x4;  grossly normal neurologically. Skin:  Intact without significant lesions or rashes. Psych:  Alert and cooperative. Normal mood and affect.  Lab Results:  Recent Labs  08/10/13 1249 08/10/13 1809 08/11/13 0433  WBC 6.6 7.2 6.1  HGB 12.2 12.7 11.1*  HCT 35.9* 37.4 33.0*  PLT 121* 150 140*   BMET  Recent Labs  08/10/13 1249 08/10/13 1809 08/11/13 0433  NA 142 139 140  K 4.1 4.5 4.7  CL 101 97 102  CO2 25 27 27   GLUCOSE 84 85 94  BUN 16 15 15   CREATININE 1.11* 1.01 1.18*  CALCIUM 9.7 9.8 9.6   LFT  Recent Labs  08/11/13 0433  PROT 6.4  ALBUMIN 3.0*  AST 17  ALT 9  ALKPHOS 55  BILITOT 0.3   PT/INR  Recent Labs  08/10/13 1809  LABPROT 13.3  INR 1.01    Studies/Results: Ct Abdomen Pelvis W Contrast  08/10/2013   CLINICAL DATA:  Bright red blood per rectum, abdominal pain  EXAM: CT ABDOMEN AND PELVIS WITH CONTRAST  TECHNIQUE: Multidetector CT imaging of the abdomen and pelvis was performed using the standard protocol following bolus administration of intravenous contrast.  CONTRAST:  32mL OMNIPAQUE IOHEXOL 300 MG/ML SOLN, 148mL OMNIPAQUE IOHEXOL 300 MG/ML SOLN  COMPARISON:  07/05/2013  FINDINGS: The lung bases are free  of acute infiltrate or sizable effusion. A large hiatal hernia is again identified.  The liver, spleen, gallbladder, adrenal glands and pancreas are normal in their CT appearance. Prominence of the common bile duct is again identified and stable. This likely within normal limits given the patient's age.  The kidneys are well visualized and demonstrate a normal enhancement pattern. Small cystic lesions are noted bilaterally. No obstructive changes are seen. No calculi are noted.  Significant colonic diverticulosis is noted. No findings to suggest diverticulitis are seen.  No focal mass lesion is seen. The bladder is well distended. No pelvic mass lesion is seen. The uterus has been surgically removed. The appendix is been surgically removed. Significant degenerative changes of the lumbar spine are again noted.  IMPRESSION: Chronic changes as described above similar this scan of the previous month. No acute abnormality is identified.   Electronically Signed   By: Inez Catalina M.D.   On: 08/10/2013 14:02   Impression:  1.  Recurrent hematochezia, with known extensive diverticulosis, without hemodynamic compromise.  Suspect recurrent diverticular bleeding. 2.  Mild anemia, possibly dilutional.  Plan:  1.  Gentle hydration, clear liquids today ok, serial CBCs. 2.  If rebleeding, would do tagged RBC study as next step in management. 3.  If not rebleeding, would advance diet tomorrow, and consider discharge home in the next couple days. 4.  [facility] will follow.   LOS: 1 day   Macaila Tahir M  08/11/2013, 10:54 AM

## 2013-08-11 NOTE — Consult Note (Signed)
Date: 08/11/2013               Patient Name:  [name] MRN: [id number]  DOB: 12/19/1916 Age / Sex: 80 y.o., female   PCP: Josetta Huddle, MD         Requesting Physician: Dr. Josetta Huddle, MD    Consulting Reason:  GI bleed     Chief Complaint: bright red blood per rectum  History of Present Illness: [name] is a 80 year old woman with diverticular disease presenting with recurrent hematochezia that started Sunday. She last noticed blood last night when she wiped after urinating. Her stools have been formed. Endorses some mild abdominal pain in the lower abdomen. Reports some nausea in the mornings, but no emesis. Denies melena. She was last seen in the ED 07/05/2013 for hematochezia with CT demonstrating extensive diverticulosis of the colon and some mild acute colitis of the right colon. She was sent home on antibiotics. Reports lightheadedness with standing and weakness. Denies fevers, chills, constipation, dysuria. Denies NSAID use. Last colonoscopy about 5 years ago demonstrated moderately severe sigmoid and left sided diverticulosis without evidence of polyps, inflammation, mass or vascular ectasia. EGD 05/21/2010 demonstrated 3 small superficial antral ulcers and 4 cm hiatal hernia.  Meds: Current Facility-Administered Medications  Medication Dose Route Frequency Provider Last Rate Last Dose  . 0.9 %  sodium chloride infusion   Intravenous Continuous Josetta Huddle, MD 75 mL/hr at 08/10/13 1833    . acetaminophen (TYLENOL) tablet 650 mg  650 mg Oral Q6H PRN Robbie Lis, MD       Or  . acetaminophen (TYLENOL) suppository 650 mg  650 mg Rectal Q6H PRN Robbie Lis, MD      . antiseptic oral rinse (BIOTENE) solution 15 mL  15 mL Mouth Rinse q12n4p Robbie Lis, MD      . atenolol (TENORMIN) tablet 25 mg  25 mg Oral Daily Robbie Lis, MD   25 mg at 08/10/13 2144  . chlorhexidine (PERIDEX) 0.12 % solution 15 mL  15 mL Mouth Rinse BID Robbie Lis, MD   15 mL at 08/10/13 2148    . citalopram (CELEXA) tablet 40 mg  40 mg Oral Daily Robbie Lis, MD   40 mg at 08/10/13 2138  . clotrimazole (LOTRIMIN) 1 % cream 1 application  1 application Topical BID Robbie Lis, MD      . estrogens (conjugated) (PREMARIN) tablet 0.3 mg  0.3 mg Oral Daily Robbie Lis, MD   0.3 mg at 08/10/13 2136  . furosemide (LASIX) tablet 40 mg  40 mg Oral Daily Robbie Lis, MD      . isosorbide mononitrate (IMDUR) 24 hr tablet 30 mg  30 mg Oral Daily Robbie Lis, MD   30 mg at 08/10/13 2137  . ketoconazole (NIZORAL) 2 % cream 1 application  1 application Topical BID PRN Robbie Lis, MD      . loperamide (IMODIUM) capsule 2 mg  2 mg Oral PRN Robbie Lis, MD      . meclizine (ANTIVERT) tablet 25 mg  25 mg Oral BID PRN Robbie Lis, MD      . multivitamin-prenatal tablet 1 tablet  1 tablet Oral Daily Robbie Lis, MD   1 tablet at 08/10/13 2137  . ondansetron (ZOFRAN) tablet 4 mg  4 mg Oral Q6H PRN Robbie Lis, MD       Or  . ondansetron [facility]) injection 4 mg  4 mg Intravenous Q6H PRN Robbie Lis, MD      . pantoprazole (PROTONIX) injection 40 mg  40 mg Intravenous Q12H Robbie Lis, MD   40 mg at 08/10/13 2139  . polyvinyl alcohol (LIQUIFILM TEARS) 1.4 % ophthalmic solution 1 drop  1 drop Both Eyes TID PRN Robbie Lis, MD      . potassium chloride (K-DUR) CR tablet 10 mEq  10 mEq Oral TID Robbie Lis, MD   10 mEq at 08/10/13 2137  . triamcinolone cream (KENALOG) 0.1 % 1 application  1 application Topical BID PRN Robbie Lis, MD   1 application at 26/94/85 2145    Allergies: Allergies as of 08/10/2013 - Review Complete 08/10/2013  Allergen Reaction Noted  . Ace inhibitors Hives and Itching 12/25/2010  . Penicillins  12/25/2010  . Streptomycin  12/25/2010  . Tramadol  12/25/2010   Past Medical History  Diagnosis Date  . Hyperglycemia   . CAD (coronary artery disease)   . Pneumonia     july 2009  . Hyperlipidemia   . Hormone replacement therapy (postmenopausal)    . Diverticulosis   . DJD (degenerative joint disease) of lumbar spine     scoliosis  . DJD (degenerative joint disease) of knee   . Sciatica     right lower extremity  . Allergic rhinitis   . Reactive depression (situational)   . Shingles     right thoracic 2008  . Eczema   . GI bleed   . Diverticular disease   . Esophageal erosions     from nsaid   . Cough   . Orthopnea   . Vertigo   . CHF (congestive heart failure)   . Hypertension    Past Surgical History  Procedure Laterality Date  . Back surgery    . Total abdominal hysterectomy    . Breast lumpectomy    . Appendectomy     Family History  Problem Relation Age of Onset  . Heart disease Daughter   . Heart disease Son   . Heart disease Father   . Heart disease Mother   . Cancer Brother   . Heart disease Sister   Denies family history of colon cancer or IBD.  History   Social History  . Marital Status: Widowed    Spouse Name: N/A    Number of Children: 0  . Years of Education: N/A   Occupational History  . retired Education officer, [employer]    Social History Main Topics  . Smoking status: Never Smoker   . Smokeless tobacco: Never Used  . Alcohol Use: Yes     Comment: occasional cocktail  . Drug Use: No  . Sexual Activity: Not on file   Other Topics Concern  . Not on file   Social History Narrative   Pt is widowed and lives alone.          Review of Systems: Constitutional: negative for chills and fevers Respiratory: positive for dyspnea on exertion Cardiovascular: positive for exertional chest pressure/discomfort Gastrointestinal: negative, as per HPI Genitourinary:negative for dysuria Musculoskeletal:negative Neurological: positive for weakness  Physical Exam: Blood pressure 138/66, pulse 56, temperature 97.6 F (36.4 C), temperature source Oral, resp. rate 20, height 5\' 5"  (1.651 m), weight 157 lb (71.215 kg), SpO2 96.00%. BP 138/66  Pulse 56  Temp(Src) 97.6 F (36.4 C) (Oral)  Resp 20  Ht  5\' 5"  (1.651 m)  Wt 157 lb (71.215 kg)  BMI 26.13 kg/m2  SpO2  96%  General Appearance:    Alert, cooperative, no distress, appears stated age  31:    Normocephalic, without obvious abnormality, atraumatic, EOMI, MMM, sclera anicteric  Neck:   Supple, symmetrical, trachea midline, no adenopathy  Lungs:     Clear to auscultation bilaterally, respirations unlabored   Heart:    Regular rate and rhythm, S1 and S2 normal  Abdomen:     Soft, moderately tender in LLQ and RLQ, bowel sounds active all four quadrants, no masses, no organomegaly  Extremities:   Extremities normal, atraumatic, no cyanosis or edema  Skin:   Skin color, texture, turgor normal, no rashes or lesions  Neurologic:   No gross deficits    Lab results: Basic Metabolic Panel:  Recent Labs  08/10/13 1809 08/11/13 0433  NA 139 140  K 4.5 4.7  CL 97 102  CO2 27 27  GLUCOSE 85 94  BUN 15 15  CREATININE 1.01 1.18*  CALCIUM 9.8 9.6  MG 1.6  --   PHOS 2.8  --    Liver Function Tests:  Recent Labs  08/10/13 1809 08/11/13 0433  AST 20 17  ALT 8 9  ALKPHOS 64 55  BILITOT 0.4 0.3  PROT 7.6 6.4  ALBUMIN 3.7 3.0*   CBC:  Recent Labs  08/10/13 1249 08/10/13 1809 08/11/13 0433  WBC 6.6 7.2 6.1  NEUTROABS 3.7 3.8  --   HGB 12.2 12.7 11.1*  HCT 35.9* 37.4 33.0*  MCV 91.6 92.1 92.4  PLT 121* 150 140*   CBG:  Recent Labs  08/11/13 0806  GLUCAP 97   Thyroid Function Tests:  Recent Labs  08/10/13 1810  TSH 3.040   Coagulation:  Recent Labs  08/10/13 1809  LABPROT 13.3  INR 1.01   Urinalysis:  Recent Labs  08/10/13 1439  COLORURINE YELLOW  LABSPEC 1.019  PHURINE 6.5  GLUCOSEU NEGATIVE  HGBUR TRACE*  BILIRUBINUR NEGATIVE  KETONESUR NEGATIVE  PROTEINUR NEGATIVE  UROBILINOGEN 0.2  NITRITE NEGATIVE  LEUKOCYTESUR NEGATIVE   Misc. Labs: Fecal Occult Blood Test: positive  Imaging results:  Ct Abdomen Pelvis W Contrast  08/10/2013   CLINICAL DATA:  Bright red blood per rectum,  abdominal pain  EXAM: CT ABDOMEN AND PELVIS WITH CONTRAST  TECHNIQUE: Multidetector CT imaging of the abdomen and pelvis was performed using the standard protocol following bolus administration of intravenous contrast.  CONTRAST:  63mL OMNIPAQUE IOHEXOL 300 MG/ML SOLN, 144mL OMNIPAQUE IOHEXOL 300 MG/ML SOLN  COMPARISON:  07/05/2013  FINDINGS: The lung bases are free of acute infiltrate or sizable effusion. A large hiatal hernia is again identified.  The liver, spleen, gallbladder, adrenal glands and pancreas are normal in their CT appearance. Prominence of the common bile duct is again identified and stable. This likely within normal limits given the patient's age.  The kidneys are well visualized and demonstrate a normal enhancement pattern. Small cystic lesions are noted bilaterally. No obstructive changes are seen. No calculi are noted.  Significant colonic diverticulosis is noted. No findings to suggest diverticulitis are seen. No focal mass lesion is seen. The bladder is well distended. No pelvic mass lesion is seen. The uterus has been surgically removed. The appendix is been surgically removed. Significant degenerative changes of the lumbar spine are again noted.  IMPRESSION: Chronic changes as described above similar this scan of the previous month. No acute abnormality is identified.   Electronically Signed   By: Inez Catalina M.D.   On: 08/10/2013 14:02   Assessment, Plan, & Recommendations  by Problem: 80 year old woman with diverticular disease presenting with recurrent hematochezia. Likely lower GI bleed 2/2 known diverticular disease. Other differential dx would include internal hemorrhoids, polyp/mass, vascular ectasia, inflammatory. CT 08/10/2013 with significant colonic diverticulosis, but no focal mass lesion seen. Colonscopy 5 years ago without evidence of polyp, mass vascular ectasia or inflammation.  Recommendations: -Continue supportive management -Clear liquid diet -If rebleeding, will  pursue further diagnostic imaging.  Pt seen and discussed with attending physician, Dr. Paulita Fujita  Signed: Jacques Earthly, MD 08/11/2013, 8:48 AM

## 2013-08-11 NOTE — Progress Notes (Signed)
Subjective: [name] presents with recurrent rectal bleeding.  Patient had an episode last month felt to possibly be diverticulitis with rectal bleeding for several days and has had it previous to that as well.  Over the last several days she said very heavy with rectal bleeding, much more than usual, and presented to the emergency room.  CT scanning does not show evidence of diverticulitis but she is moderately tender in her left lower quadrant.  She saw Dr. Teena Irani and will consult with [facility] for probable colonoscopy while in the hospital.  Will not start antibiotics as yet and will await for GI opinion.  Check hemoglobin this afternoon and in a.m.  Clear liquid diet  Objective: Weight change:   Intake/Output Summary (Last 24 hours) at 08/11/13 3007 Last data filed at 08/10/13 1900  Gross per 24 hour  Intake    360 ml  Output      0 ml  Net    360 ml   Filed Vitals:   08/10/13 1707 08/10/13 1723 08/10/13 1753 08/10/13 2215  BP: 143/79  150/70 138/70  Pulse: 91  70 67  Temp:  97.6 F (36.4 C) 97.8 F (36.6 C) 97.6 F (36.4 C)  TempSrc:  Oral Oral Oral  Resp: 22  20 20   Height:   5' 5"  (1.651 m)   Weight:   157 lb (71.215 kg)   SpO2: 96%  98% 97%    General Appearance: Alert, cooperative, no distress, appears stated age Lungs: Clear to auscultation bilaterally, respirations unlabored Heart: Regular rate and rhythm, S1 and S2 normal, no murmur, rub or gallop Abdomen: Nondistended but moderately tender in the left lower quadrant to palpation Extremities: Extremities normal, atraumatic, no cyanosis or edema Neuro: Alert and oriented x3, nonfocal  Lab Results: Results for orders placed during the hospital encounter of 08/10/13 (from the past 48 hour(s))  CBC WITH DIFFERENTIAL     Status: Abnormal   Collection Time    08/10/13 12:49 PM      Result Value Ref Range   WBC 6.6  4.0 - 10.5 K/uL   RBC 3.92  3.87 - 5.11 MIL/uL   Hemoglobin 12.2  12.0 - 15.0 g/dL   HCT 35.9 (*)  36.0 - 46.0 %   MCV 91.6  78.0 - 100.0 fL   MCH 31.1  26.0 - 34.0 pg   MCHC 34.0  30.0 - 36.0 g/dL   RDW 13.4  11.5 - 15.5 %   Platelets 121 (*) 150 - 400 K/uL   Neutrophils Relative % 56  43 - 77 %   Neutro Abs 3.7  1.7 - 7.7 K/uL   Lymphocytes Relative 31  12 - 46 %   Lymphs Abs 2.0  0.7 - 4.0 K/uL   Monocytes Relative 13 (*) 3 - 12 %   Monocytes Absolute 0.8  0.1 - 1.0 K/uL   Eosinophils Relative 0  0 - 5 %   Eosinophils Absolute 0.0  0.0 - 0.7 K/uL   Basophils Relative 0  0 - 1 %   Basophils Absolute 0.0  0.0 - 0.1 K/uL  COMPREHENSIVE METABOLIC PANEL     Status: Abnormal   Collection Time    08/10/13 12:49 PM      Result Value Ref Range   Sodium 142  137 - 147 mEq/L   Potassium 4.1  3.7 - 5.3 mEq/L   Chloride 101  96 - 112 mEq/L   CO2 25  19 - 32 mEq/L  Glucose, Bld 84  70 - 99 mg/dL   BUN 16  6 - 23 mg/dL   Creatinine, Ser 1.11 (*) 0.50 - 1.10 mg/dL   Calcium 9.7  8.4 - 10.5 mg/dL   Total Protein 7.3  6.0 - 8.3 g/dL   Albumin 3.6  3.5 - 5.2 g/dL   AST 19  0 - 37 U/L   ALT 8  0 - 35 U/L   Alkaline Phosphatase 62  39 - 117 U/L   Total Bilirubin 0.3  0.3 - 1.2 mg/dL   GFR calc non Af Amer 41 (*) >90 mL/min   GFR calc Af Amer 48 (*) >90 mL/min   Comment: (NOTE)     The eGFR has been calculated using the CKD EPI equation.     This calculation has not been validated in all clinical situations.     eGFR's persistently <90 mL/min signify possible Chronic Kidney     Disease.   Anion gap 16 (*) 5 - 15  I-STAT CG4 LACTIC ACID, ED     Status: None   Collection Time    08/10/13  1:00 PM      Result Value Ref Range   Lactic Acid, Venous 1.73  0.5 - 2.2 mmol/L  URINALYSIS, ROUTINE W REFLEX MICROSCOPIC     Status: Abnormal   Collection Time    08/10/13  2:39 PM      Result Value Ref Range   Color, Urine YELLOW  YELLOW   APPearance CLEAR  CLEAR   Specific Gravity, Urine 1.019  1.005 - 1.030   pH 6.5  5.0 - 8.0   Glucose, UA NEGATIVE  NEGATIVE mg/dL   Hgb urine dipstick  TRACE (*) NEGATIVE   Bilirubin Urine NEGATIVE  NEGATIVE   Ketones, ur NEGATIVE  NEGATIVE mg/dL   Protein, ur NEGATIVE  NEGATIVE mg/dL   Urobilinogen, UA 0.2  0.0 - 1.0 mg/dL   Nitrite NEGATIVE  NEGATIVE   Leukocytes, UA NEGATIVE  NEGATIVE  URINE MICROSCOPIC-ADD ON     Status: None   Collection Time    08/10/13  2:39 PM      Result Value Ref Range   Squamous Epithelial / LPF RARE  RARE   RBC / HPF 0-2  <3 RBC/hpf  POC OCCULT BLOOD, ED     Status: Abnormal   Collection Time    08/10/13  2:54 PM      Result Value Ref Range   Fecal Occult Bld POSITIVE (*) NEGATIVE  COMPREHENSIVE METABOLIC PANEL     Status: Abnormal   Collection Time    08/10/13  6:09 PM      Result Value Ref Range   Sodium 139  137 - 147 mEq/L   Potassium 4.5  3.7 - 5.3 mEq/L   Chloride 97  96 - 112 mEq/L   CO2 27  19 - 32 mEq/L   Glucose, Bld 85  70 - 99 mg/dL   BUN 15  6 - 23 mg/dL   Creatinine, Ser 1.01  0.50 - 1.10 mg/dL   Calcium 9.8  8.4 - 10.5 mg/dL   Total Protein 7.6  6.0 - 8.3 g/dL   Albumin 3.7  3.5 - 5.2 g/dL   AST 20  0 - 37 U/L   ALT 8  0 - 35 U/L   Alkaline Phosphatase 64  39 - 117 U/L   Total Bilirubin 0.4  0.3 - 1.2 mg/dL   GFR calc non Af Amer 46 (*) >90 mL/min  GFR calc Af Amer 53 (*) >90 mL/min   Comment: (NOTE)     The eGFR has been calculated using the CKD EPI equation.     This calculation has not been validated in all clinical situations.     eGFR's persistently <90 mL/min signify possible Chronic Kidney     Disease.   Anion gap 15  5 - 15  MAGNESIUM     Status: None   Collection Time    08/10/13  6:09 PM      Result Value Ref Range   Magnesium 1.6  1.5 - 2.5 mg/dL  PHOSPHORUS     Status: None   Collection Time    08/10/13  6:09 PM      Result Value Ref Range   Phosphorus 2.8  2.3 - 4.6 mg/dL  CBC WITH DIFFERENTIAL     Status: None   Collection Time    08/10/13  6:09 PM      Result Value Ref Range   WBC 7.2  4.0 - 10.5 K/uL   RBC 4.06  3.87 - 5.11 MIL/uL   Hemoglobin  12.7  12.0 - 15.0 g/dL   HCT 37.4  36.0 - 46.0 %   MCV 92.1  78.0 - 100.0 fL   MCH 31.3  26.0 - 34.0 pg   MCHC 34.0  30.0 - 36.0 g/dL   RDW 13.3  11.5 - 15.5 %   Platelets 150  150 - 400 K/uL   Neutrophils Relative % 53  43 - 77 %   Neutro Abs 3.8  1.7 - 7.7 K/uL   Lymphocytes Relative 36  12 - 46 %   Lymphs Abs 2.6  0.7 - 4.0 K/uL   Monocytes Relative 11  3 - 12 %   Monocytes Absolute 0.8  0.1 - 1.0 K/uL   Eosinophils Relative 0  0 - 5 %   Eosinophils Absolute 0.0  0.0 - 0.7 K/uL   Basophils Relative 0  0 - 1 %   Basophils Absolute 0.0  0.0 - 0.1 K/uL  APTT     Status: Abnormal   Collection Time    08/10/13  6:09 PM      Result Value Ref Range   aPTT 62 (*) 24 - 37 seconds   Comment:            IF BASELINE aPTT IS ELEVATED,     SUGGEST PATIENT RISK ASSESSMENT     BE USED TO DETERMINE APPROPRIATE     ANTICOAGULANT THERAPY.  PROTIME-INR     Status: None   Collection Time    08/10/13  6:09 PM      Result Value Ref Range   Prothrombin Time 13.3  11.6 - 15.2 seconds   INR 1.01  0.00 - 1.49  TSH     Status: None   Collection Time    08/10/13  6:10 PM      Result Value Ref Range   TSH 3.040  0.350 - 4.500 uIU/mL   Comment: Performed at [facility] PANEL     Status: Abnormal   Collection Time    08/11/13  4:33 AM      Result Value Ref Range   Sodium 140  137 - 147 mEq/L   Potassium 4.7  3.7 - 5.3 mEq/L   Chloride 102  96 - 112 mEq/L   CO2 27  19 - 32 mEq/L   Glucose, Bld 94  70 - 99 mg/dL  BUN 15  6 - 23 mg/dL   Creatinine, Ser 1.18 (*) 0.50 - 1.10 mg/dL   Calcium 9.6  8.4 - 10.5 mg/dL   Total Protein 6.4  6.0 - 8.3 g/dL   Albumin 3.0 (*) 3.5 - 5.2 g/dL   AST 17  0 - 37 U/L   ALT 9  0 - 35 U/L   Alkaline Phosphatase 55  39 - 117 U/L   Total Bilirubin 0.3  0.3 - 1.2 mg/dL   GFR calc non Af Amer 38 (*) >90 mL/min   GFR calc Af Amer 44 (*) >90 mL/min   Comment: (NOTE)     The eGFR has been calculated using the CKD EPI equation.      This calculation has not been validated in all clinical situations.     eGFR's persistently <90 mL/min signify possible Chronic Kidney     Disease.   Anion gap 11  5 - 15  CBC     Status: Abnormal   Collection Time    08/11/13  4:33 AM      Result Value Ref Range   WBC 6.1  4.0 - 10.5 K/uL   RBC 3.57 (*) 3.87 - 5.11 MIL/uL   Hemoglobin 11.1 (*) 12.0 - 15.0 g/dL   HCT 33.0 (*) 36.0 - 46.0 %   MCV 92.4  78.0 - 100.0 fL   MCH 31.1  26.0 - 34.0 pg   MCHC 33.6  30.0 - 36.0 g/dL   RDW 13.3  11.5 - 15.5 %   Platelets 140 (*) 150 - 400 K/uL    Studies/Results: Ct Abdomen Pelvis W Contrast  08/10/2013   CLINICAL DATA:  Bright red blood per rectum, abdominal pain  EXAM: CT ABDOMEN AND PELVIS WITH CONTRAST  TECHNIQUE: Multidetector CT imaging of the abdomen and pelvis was performed using the standard protocol following bolus administration of intravenous contrast.  CONTRAST:  51m OMNIPAQUE IOHEXOL 300 MG/ML SOLN, 1086mOMNIPAQUE IOHEXOL 300 MG/ML SOLN  COMPARISON:  07/05/2013  FINDINGS: The lung bases are free of acute infiltrate or sizable effusion. A large hiatal hernia is again identified.  The liver, spleen, gallbladder, adrenal glands and pancreas are normal in their CT appearance. Prominence of the common bile duct is again identified and stable. This likely within normal limits given the patient's age.  The kidneys are well visualized and demonstrate a normal enhancement pattern. Small cystic lesions are noted bilaterally. No obstructive changes are seen. No calculi are noted.  Significant colonic diverticulosis is noted. No findings to suggest diverticulitis are seen. No focal mass lesion is seen. The bladder is well distended. No pelvic mass lesion is seen. The uterus has been surgically removed. The appendix is been surgically removed. Significant degenerative changes of the lumbar spine are again noted.  IMPRESSION: Chronic changes as described above similar this scan of the previous month. No  acute abnormality is identified.   Electronically Signed   By: MaInez Catalina.D.   On: 08/10/2013 14:02   Medications: Scheduled Meds: . antiseptic oral rinse  15 mL Mouth Rinse q12n4p  . atenolol  25 mg Oral Daily  . chlorhexidine  15 mL Mouth Rinse BID  . citalopram  40 mg Oral Daily  . clotrimazole  1 application Topical BID  . estrogens (conjugated)  0.3 mg Oral Daily  . furosemide  40 mg Oral Daily  . isosorbide mononitrate  30 mg Oral Daily  . multivitamin-prenatal  1 tablet Oral Daily  . pantoprazole (PROTONIX)  IV  40 mg Intravenous Q12H  . potassium chloride  10 mEq Oral TID   Continuous Infusions: . sodium chloride 75 mL/hr at 08/10/13 1833   PRN Meds:.acetaminophen, acetaminophen, ketoconazole, loperamide, meclizine, ondansetron (ZOFRAN) IV, ondansetron, polyvinyl alcohol, triamcinolone cream  Assessment/Plan: Principal Problem:   Rectal bleed - check hemoglobin this afternoon and in a.m.  GI consultation.  Rate of aspirin Active Problems:   Essential hypertension, benign - controlled   Acute renal failure - check be met in a.m. and continue IV fluids    LOS: 1 day   Henrine Screws, MD 08/11/2013, 7:12 AM

## 2013-08-11 NOTE — ED Provider Notes (Signed)
Medical screening examination/treatment/procedure(s) were conducted as a shared visit with non-physician practitioner(s) and myself.  I personally evaluated the patient during the encounter.   EKG Interpretation None     Patient with GI bleeding. Hemoglobin is stable. States she has been dizzy. Labs reassuring. Will likely admit  [name]. Alvino Chapel, MD 08/11/13 [phone]

## 2013-08-12 LAB — CBC WITH DIFFERENTIAL/PLATELET
BASOS ABS: 0 10*3/uL (ref 0.0–0.1)
Basophils Relative: 0 % (ref 0–1)
EOS ABS: 0 10*3/uL (ref 0.0–0.7)
Eosinophils Relative: 0 % (ref 0–5)
HEMATOCRIT: 33.3 % — AB (ref 36.0–46.0)
Hemoglobin: 11.4 g/dL — ABNORMAL LOW (ref 12.0–15.0)
LYMPHS ABS: 2.4 10*3/uL (ref 0.7–4.0)
LYMPHS PCT: 36 % (ref 12–46)
MCH: 31.3 pg (ref 26.0–34.0)
MCHC: 34.2 g/dL (ref 30.0–36.0)
MCV: 91.5 fL (ref 78.0–100.0)
Monocytes Absolute: 0.9 10*3/uL (ref 0.1–1.0)
Monocytes Relative: 13 % — ABNORMAL HIGH (ref 3–12)
Neutro Abs: 3.3 10*3/uL (ref 1.7–7.7)
Neutrophils Relative %: 51 % (ref 43–77)
Platelets: 163 10*3/uL (ref 150–400)
RBC: 3.64 MIL/uL — ABNORMAL LOW (ref 3.87–5.11)
RDW: 13.4 % (ref 11.5–15.5)
WBC: 6.6 10*3/uL (ref 4.0–10.5)

## 2013-08-12 LAB — GLUCOSE, CAPILLARY: Glucose-Capillary: 94 mg/dL (ref 70–99)

## 2013-08-12 MED ORDER — HYDROCORTISONE ACETATE 25 MG RE SUPP
25.0000 mg | Freq: Two times a day (BID) | RECTAL | Status: DC
  Administered 2013-08-12 – 2013-08-14 (×4): 25 mg via RECTAL
  Filled 2013-08-12 (×6): qty 1

## 2013-08-12 NOTE — Progress Notes (Signed)
Subjective: Small episode of blood on tissue paper.  Objective: Vital signs in last 24 hours: Temp:  [97.9 F (36.6 C)] 97.9 F (36.6 C) (07/16 1402) Pulse Rate:  [58-63] 58 (07/16 1402) Resp:  [16] 16 (07/16 1402) BP: (115-152)/(60-66) 115/60 mmHg (07/16 1402) SpO2:  [94 %-97 %] 97 % (07/16 1402) Weight:  [72.8 kg (160 lb 7.9 oz)] 72.8 kg (160 lb 7.9 oz) (07/16 0640) Weight change: 1.585 kg (3 lb 7.9 oz) Last BM Date: 08/10/13  PE: GEN:  NAD, much younger-appearing than stated age ABD:  Soft  Lab Results: CBC    Component Value Date/Time   WBC 6.6 08/12/2013 0424   RBC 3.64* 08/12/2013 0424   HGB 11.4* 08/12/2013 0424   HCT 33.3* 08/12/2013 0424   PLT 163 08/12/2013 0424   MCV 91.5 08/12/2013 0424   MCH 31.3 08/12/2013 0424   MCHC 34.2 08/12/2013 0424   RDW 13.4 08/12/2013 0424   LYMPHSABS 2.4 08/12/2013 0424   MONOABS 0.9 08/12/2013 0424   EOSABS 0.0 08/12/2013 0424   BASOSABS 0.0 08/12/2013 0424   CMP     Component Value Date/Time   NA 140 08/11/2013 0433   K 4.7 08/11/2013 0433   CL 102 08/11/2013 0433   CO2 27 08/11/2013 0433   GLUCOSE 94 08/11/2013 0433   BUN 15 08/11/2013 0433   CREATININE 1.18* 08/11/2013 0433   CALCIUM 9.6 08/11/2013 0433   PROT 6.4 08/11/2013 0433   ALBUMIN 3.0* 08/11/2013 0433   AST 17 08/11/2013 0433   ALT 9 08/11/2013 0433   ALKPHOS 55 08/11/2013 0433   BILITOT 0.3 08/11/2013 0433   GFRNONAA 38* 08/11/2013 0433   GFRAA 44* 08/11/2013 0433   Assessment:  1.  Hematochezia.  Mild, intermittent.  Current volume doesn't seem consistent with diverticulosis.  Might have some hemorrhoidal bleeding (moderate internal hemorrhoids were seen on colonoscopy in 2010).  Plan:  1.  Continue to advance diet. 2.  Trial of Anusol-HC suppositories bid. 3.  If rampant rebleeding, consider tagged RBC study as next step in management. 4.  If slow mild bleeding continues, despite trial of Anusol, consider sigmoidoscopy. 5.  Will follow.   Landry Dyke 08/12/2013,  3:40 PM

## 2013-08-12 NOTE — Progress Notes (Signed)
Subjective: Patient only had one stool yesterday with blood.  None through the night.  Hemoglobin is stable.  Still with some left lower quadrant tenderness to palpation but she says this is chronic.  No fevers or chills.  We'll start to increase activity and advance diet.  Any further bleeding, will consider red cell study as per GI and possible colonoscopy  Objective: Weight change:   Intake/Output Summary (Last 24 hours) at 08/12/13 0641 Last data filed at 08/11/13 2230  Gross per 24 hour  Intake    480 ml  Output    700 ml  Net   -220 ml   Filed Vitals:   08/10/13 2215 08/11/13 0658 08/11/13 1500 08/11/13 2231  BP: 138/70 138/66 130/51 122/66  Pulse: 67 56 60 59  Temp: 97.6 F (36.4 C) 97.6 F (36.4 C) 97.8 F (36.6 C) 97.9 F (36.6 C)  TempSrc: Oral Oral Oral Oral  Resp: 20 20 17 16   Height:      Weight:      SpO2: 97% 96% 100% 97%    General Appearance: Alert, cooperative, no distress, appears stated age  Lungs: Clear to auscultation bilaterally, respirations unlabored  Heart: Regular rate and rhythm, S1 and S2 normal, no murmur, rub or gallop  Abdomen: Nondistended but moderately tender in the left lower quadrant to palpation  Extremities: Extremities normal, atraumatic, no cyanosis or edema  Neuro: Alert and oriented x3, nonfocal  Lab Results: Results for orders placed during the hospital encounter of 08/10/13 (from the past 48 hour(s))  CBC WITH DIFFERENTIAL     Status: Abnormal   Collection Time    08/10/13 12:49 PM      Result Value Ref Range   WBC 6.6  4.0 - 10.5 K/uL   RBC 3.92  3.87 - 5.11 MIL/uL   Hemoglobin 12.2  12.0 - 15.0 g/dL   HCT 35.9 (*) 36.0 - 46.0 %   MCV 91.6  78.0 - 100.0 fL   MCH 31.1  26.0 - 34.0 pg   MCHC 34.0  30.0 - 36.0 g/dL   RDW 13.4  11.5 - 15.5 %   Platelets 121 (*) 150 - 400 K/uL   Neutrophils Relative % 56  43 - 77 %   Neutro Abs 3.7  1.7 - 7.7 K/uL   Lymphocytes Relative 31  12 - 46 %   Lymphs Abs 2.0  0.7 - 4.0 K/uL   Monocytes Relative 13 (*) 3 - 12 %   Monocytes Absolute 0.8  0.1 - 1.0 K/uL   Eosinophils Relative 0  0 - 5 %   Eosinophils Absolute 0.0  0.0 - 0.7 K/uL   Basophils Relative 0  0 - 1 %   Basophils Absolute 0.0  0.0 - 0.1 K/uL  COMPREHENSIVE METABOLIC PANEL     Status: Abnormal   Collection Time    08/10/13 12:49 PM      Result Value Ref Range   Sodium 142  137 - 147 mEq/L   Potassium 4.1  3.7 - 5.3 mEq/L   Chloride 101  96 - 112 mEq/L   CO2 25  19 - 32 mEq/L   Glucose, Bld 84  70 - 99 mg/dL   BUN 16  6 - 23 mg/dL   Creatinine, Ser 1.11 (*) 0.50 - 1.10 mg/dL   Calcium 9.7  8.4 - 10.5 mg/dL   Total Protein 7.3  6.0 - 8.3 g/dL   Albumin 3.6  3.5 - 5.2 g/dL   AST  19  0 - 37 U/L   ALT 8  0 - 35 U/L   Alkaline Phosphatase 62  39 - 117 U/L   Total Bilirubin 0.3  0.3 - 1.2 mg/dL   GFR calc non Af Amer 41 (*) >90 mL/min   GFR calc Af Amer 48 (*) >90 mL/min   Comment: (NOTE)     The eGFR has been calculated using the CKD EPI equation.     This calculation has not been validated in all clinical situations.     eGFR's persistently <90 mL/min signify possible Chronic Kidney     Disease.   Anion gap 16 (*) 5 - 15  I-STAT CG4 LACTIC ACID, ED     Status: None   Collection Time    08/10/13  1:00 PM      Result Value Ref Range   Lactic Acid, Venous 1.73  0.5 - 2.2 mmol/L  URINALYSIS, ROUTINE W REFLEX MICROSCOPIC     Status: Abnormal   Collection Time    08/10/13  2:39 PM      Result Value Ref Range   Color, Urine YELLOW  YELLOW   APPearance CLEAR  CLEAR   Specific Gravity, Urine 1.019  1.005 - 1.030   pH 6.5  5.0 - 8.0   Glucose, UA NEGATIVE  NEGATIVE mg/dL   Hgb urine dipstick TRACE (*) NEGATIVE   Bilirubin Urine NEGATIVE  NEGATIVE   Ketones, ur NEGATIVE  NEGATIVE mg/dL   Protein, ur NEGATIVE  NEGATIVE mg/dL   Urobilinogen, UA 0.2  0.0 - 1.0 mg/dL   Nitrite NEGATIVE  NEGATIVE   Leukocytes, UA NEGATIVE  NEGATIVE  URINE MICROSCOPIC-ADD ON     Status: None   Collection Time     08/10/13  2:39 PM      Result Value Ref Range   Squamous Epithelial / LPF RARE  RARE   RBC / HPF 0-2  <3 RBC/hpf  POC OCCULT BLOOD, ED     Status: Abnormal   Collection Time    08/10/13  2:54 PM      Result Value Ref Range   Fecal Occult Bld POSITIVE (*) NEGATIVE  COMPREHENSIVE METABOLIC PANEL     Status: Abnormal   Collection Time    08/10/13  6:09 PM      Result Value Ref Range   Sodium 139  137 - 147 mEq/L   Potassium 4.5  3.7 - 5.3 mEq/L   Chloride 97  96 - 112 mEq/L   CO2 27  19 - 32 mEq/L   Glucose, Bld 85  70 - 99 mg/dL   BUN 15  6 - 23 mg/dL   Creatinine, Ser 1.01  0.50 - 1.10 mg/dL   Calcium 9.8  8.4 - 10.5 mg/dL   Total Protein 7.6  6.0 - 8.3 g/dL   Albumin 3.7  3.5 - 5.2 g/dL   AST 20  0 - 37 U/L   ALT 8  0 - 35 U/L   Alkaline Phosphatase 64  39 - 117 U/L   Total Bilirubin 0.4  0.3 - 1.2 mg/dL   GFR calc non Af Amer 46 (*) >90 mL/min   GFR calc Af Amer 53 (*) >90 mL/min   Comment: (NOTE)     The eGFR has been calculated using the CKD EPI equation.     This calculation has not been validated in all clinical situations.     eGFR's persistently <90 mL/min signify possible Chronic Kidney  Disease.   Anion gap 15  5 - 15  MAGNESIUM     Status: None   Collection Time    08/10/13  6:09 PM      Result Value Ref Range   Magnesium 1.6  1.5 - 2.5 mg/dL  PHOSPHORUS     Status: None   Collection Time    08/10/13  6:09 PM      Result Value Ref Range   Phosphorus 2.8  2.3 - 4.6 mg/dL  CBC WITH DIFFERENTIAL     Status: None   Collection Time    08/10/13  6:09 PM      Result Value Ref Range   WBC 7.2  4.0 - 10.5 K/uL   RBC 4.06  3.87 - 5.11 MIL/uL   Hemoglobin 12.7  12.0 - 15.0 g/dL   HCT 37.4  36.0 - 46.0 %   MCV 92.1  78.0 - 100.0 fL   MCH 31.3  26.0 - 34.0 pg   MCHC 34.0  30.0 - 36.0 g/dL   RDW 13.3  11.5 - 15.5 %   Platelets 150  150 - 400 K/uL   Neutrophils Relative % 53  43 - 77 %   Neutro Abs 3.8  1.7 - 7.7 K/uL   Lymphocytes Relative 36  12 - 46 %    Lymphs Abs 2.6  0.7 - 4.0 K/uL   Monocytes Relative 11  3 - 12 %   Monocytes Absolute 0.8  0.1 - 1.0 K/uL   Eosinophils Relative 0  0 - 5 %   Eosinophils Absolute 0.0  0.0 - 0.7 K/uL   Basophils Relative 0  0 - 1 %   Basophils Absolute 0.0  0.0 - 0.1 K/uL  APTT     Status: Abnormal   Collection Time    08/10/13  6:09 PM      Result Value Ref Range   aPTT 62 (*) 24 - 37 seconds   Comment:            IF BASELINE aPTT IS ELEVATED,     SUGGEST PATIENT RISK ASSESSMENT     BE USED TO DETERMINE APPROPRIATE     ANTICOAGULANT THERAPY.  PROTIME-INR     Status: None   Collection Time    08/10/13  6:09 PM      Result Value Ref Range   Prothrombin Time 13.3  11.6 - 15.2 seconds   INR 1.01  0.00 - 1.49  TSH     Status: None   Collection Time    08/10/13  6:10 PM      Result Value Ref Range   TSH 3.040  0.350 - 4.500 uIU/mL   Comment: Performed at [facility] PANEL     Status: Abnormal   Collection Time    08/11/13  4:33 AM      Result Value Ref Range   Sodium 140  137 - 147 mEq/L   Potassium 4.7  3.7 - 5.3 mEq/L   Chloride 102  96 - 112 mEq/L   CO2 27  19 - 32 mEq/L   Glucose, Bld 94  70 - 99 mg/dL   BUN 15  6 - 23 mg/dL   Creatinine, Ser 1.18 (*) 0.50 - 1.10 mg/dL   Calcium 9.6  8.4 - 10.5 mg/dL   Total Protein 6.4  6.0 - 8.3 g/dL   Albumin 3.0 (*) 3.5 - 5.2 g/dL   AST 17  0 - 37 U/L  ALT 9  0 - 35 U/L   Alkaline Phosphatase 55  39 - 117 U/L   Total Bilirubin 0.3  0.3 - 1.2 mg/dL   GFR calc non Af Amer 38 (*) >90 mL/min   GFR calc Af Amer 44 (*) >90 mL/min   Comment: (NOTE)     The eGFR has been calculated using the CKD EPI equation.     This calculation has not been validated in all clinical situations.     eGFR's persistently <90 mL/min signify possible Chronic Kidney     Disease.   Anion gap 11  5 - 15  CBC     Status: Abnormal   Collection Time    08/11/13  4:33 AM      Result Value Ref Range   WBC 6.1  4.0 - 10.5 K/uL   RBC 3.57  (*) 3.87 - 5.11 MIL/uL   Hemoglobin 11.1 (*) 12.0 - 15.0 g/dL   HCT 33.0 (*) 36.0 - 46.0 %   MCV 92.4  78.0 - 100.0 fL   MCH 31.1  26.0 - 34.0 pg   MCHC 33.6  30.0 - 36.0 g/dL   RDW 13.3  11.5 - 15.5 %   Platelets 140 (*) 150 - 400 K/uL  GLUCOSE, CAPILLARY     Status: None   Collection Time    08/11/13  8:06 AM      Result Value Ref Range   Glucose-Capillary 97  70 - 99 mg/dL   Comment 1 Documented in Chart     Comment 2 Notify RN    CBC     Status: Abnormal   Collection Time    08/11/13  3:59 PM      Result Value Ref Range   WBC 6.2  4.0 - 10.5 K/uL   RBC 3.92  3.87 - 5.11 MIL/uL   Hemoglobin 12.0  12.0 - 15.0 g/dL   HCT 36.4  36.0 - 46.0 %   MCV 92.9  78.0 - 100.0 fL   MCH 30.6  26.0 - 34.0 pg   MCHC 33.0  30.0 - 36.0 g/dL   RDW 13.4  11.5 - 15.5 %   Platelets 143 (*) 150 - 400 K/uL  CBC WITH DIFFERENTIAL     Status: Abnormal   Collection Time    08/12/13  4:24 AM      Result Value Ref Range   WBC 6.6  4.0 - 10.5 K/uL   Comment: WHITE COUNT CONFIRMED ON SMEAR   RBC 3.64 (*) 3.87 - 5.11 MIL/uL   Hemoglobin 11.4 (*) 12.0 - 15.0 g/dL   HCT 33.3 (*) 36.0 - 46.0 %   MCV 91.5  78.0 - 100.0 fL   MCH 31.3  26.0 - 34.0 pg   MCHC 34.2  30.0 - 36.0 g/dL   RDW 13.4  11.5 - 15.5 %   Platelets 163  150 - 400 K/uL   Neutrophils Relative % 51  43 - 77 %   Lymphocytes Relative 36  12 - 46 %   Monocytes Relative 13 (*) 3 - 12 %   Eosinophils Relative 0  0 - 5 %   Basophils Relative 0  0 - 1 %   Neutro Abs 3.3  1.7 - 7.7 K/uL   Lymphs Abs 2.4  0.7 - 4.0 K/uL   Monocytes Absolute 0.9  0.1 - 1.0 K/uL   Eosinophils Absolute 0.0  0.0 - 0.7 K/uL   Basophils Absolute 0.0  0.0 - 0.1 K/uL  Smear Review MORPHOLOGY UNREMARKABLE      Studies/Results: Ct Abdomen Pelvis W Contrast  08/10/2013   CLINICAL DATA:  Bright red blood per rectum, abdominal pain  EXAM: CT ABDOMEN AND PELVIS WITH CONTRAST  TECHNIQUE: Multidetector CT imaging of the abdomen and pelvis was performed using the  standard protocol following bolus administration of intravenous contrast.  CONTRAST:  13m OMNIPAQUE IOHEXOL 300 MG/ML SOLN, 1028mOMNIPAQUE IOHEXOL 300 MG/ML SOLN  COMPARISON:  07/05/2013  FINDINGS: The lung bases are free of acute infiltrate or sizable effusion. A large hiatal hernia is again identified.  The liver, spleen, gallbladder, adrenal glands and pancreas are normal in their CT appearance. Prominence of the common bile duct is again identified and stable. This likely within normal limits given the patient's age.  The kidneys are well visualized and demonstrate a normal enhancement pattern. Small cystic lesions are noted bilaterally. No obstructive changes are seen. No calculi are noted.  Significant colonic diverticulosis is noted. No findings to suggest diverticulitis are seen. No focal mass lesion is seen. The bladder is well distended. No pelvic mass lesion is seen. The uterus has been surgically removed. The appendix is been surgically removed. Significant degenerative changes of the lumbar spine are again noted.  IMPRESSION: Chronic changes as described above similar this scan of the previous month. No acute abnormality is identified.   Electronically Signed   By: MaInez Catalina.D.   On: 08/10/2013 14:02   Medications: Scheduled Meds: . antiseptic oral rinse  15 mL Mouth Rinse q12n4p  . atenolol  25 mg Oral Daily  . chlorhexidine  15 mL Mouth Rinse BID  . citalopram  40 mg Oral Daily  . clotrimazole  1 application Topical BID  . estrogens (conjugated)  0.3 mg Oral Daily  . feeding supplement (RESOURCE BREEZE)  1 Container Oral Q24H  . furosemide  40 mg Oral Daily  . isosorbide mononitrate  30 mg Oral Daily  . multivitamin-prenatal  1 tablet Oral Daily  . pantoprazole (PROTONIX) IV  40 mg Intravenous Q12H  . potassium chloride  10 mEq Oral TID   Continuous Infusions:  PRN Meds:.acetaminophen, acetaminophen, ketoconazole, loperamide, meclizine, ondansetron (ZOFRAN) IV, ondansetron,  polyvinyl alcohol, triamcinolone cream  Assessment/Plan: Principal Problem:   Rectal bleed - only one episode, small amount, of blood yesterday.  No bleeding since just today morning.  Hemoglobin relatively stable at 11.4.   If rebleeds, we'll do red cell study as per GI.  Will possibly need colonoscopy Active Problems:   Essential hypertension, benign - controlled   Coronary artery disease - no chest pain, asymptomatic   Severe lumbar DJD with scoliosis   Chronic right lower extremity sciatica   Anxiety/depression   Hearing loss with hearing aids   History of CHF with diastolic dysfunction, Dr. JaCasandra Doffing2014   Acute renal failure - resolved   Disposition - hopefully home in the next 24-48 hours   LOS: 2 days   RoHenrine ScrewsMD 08/12/2013, 6:41 AM

## 2013-08-13 ENCOUNTER — Encounter (HOSPITAL_COMMUNITY)
Admission: EM | Disposition: A | Discharge status: Home / Self Care | Payer: Self-pay | Source: Home / Self Care | Attending: Internal Medicine

## 2013-08-13 ENCOUNTER — Encounter (HOSPITAL_COMMUNITY): Payer: Self-pay

## 2013-08-13 DIAGNOSIS — K648 Other hemorrhoids: Secondary | ICD-10-CM

## 2013-08-13 HISTORY — PX: FLEXIBLE SIGMOIDOSCOPY: SHX5431

## 2013-08-13 LAB — CBC WITH DIFFERENTIAL/PLATELET
Basophils Absolute: 0 10*3/uL (ref 0.0–0.1)
Basophils Relative: 0 % (ref 0–1)
EOS ABS: 0 10*3/uL (ref 0.0–0.7)
EOS PCT: 0 % (ref 0–5)
HEMATOCRIT: 34.4 % — AB (ref 36.0–46.0)
HEMOGLOBIN: 11.8 g/dL — AB (ref 12.0–15.0)
Lymphocytes Relative: 27 % (ref 12–46)
Lymphs Abs: 1.9 10*3/uL (ref 0.7–4.0)
MCH: 31.6 pg (ref 26.0–34.0)
MCHC: 34.3 g/dL (ref 30.0–36.0)
MCV: 92 fL (ref 78.0–100.0)
MONOS PCT: 10 % (ref 3–12)
Monocytes Absolute: 0.7 10*3/uL (ref 0.1–1.0)
Neutro Abs: 4.2 10*3/uL (ref 1.7–7.7)
Neutrophils Relative %: 63 % (ref 43–77)
Platelets: 135 10*3/uL — ABNORMAL LOW (ref 150–400)
RBC: 3.74 MIL/uL — ABNORMAL LOW (ref 3.87–5.11)
RDW: 13.3 % (ref 11.5–15.5)
WBC: 6.8 10*3/uL (ref 4.0–10.5)

## 2013-08-13 LAB — BASIC METABOLIC PANEL
Anion gap: 11 (ref 5–15)
BUN: 16 mg/dL (ref 6–23)
CO2: 25 mEq/L (ref 19–32)
Calcium: 9.4 mg/dL (ref 8.4–10.5)
Chloride: 100 mEq/L (ref 96–112)
Creatinine, Ser: 1.42 mg/dL — ABNORMAL HIGH (ref 0.50–1.10)
GFR, EST AFRICAN AMERICAN: 35 mL/min — AB (ref >90)
GFR, EST NON AFRICAN AMERICAN: 31 mL/min — AB (ref >90)
Glucose, Bld: 125 mg/dL — ABNORMAL HIGH (ref 70–99)
Potassium: 3.9 mEq/L (ref 3.7–5.3)
Sodium: 136 mEq/L — ABNORMAL LOW (ref 137–147)

## 2013-08-13 LAB — GLUCOSE, CAPILLARY: Glucose-Capillary: 108 mg/dL — ABNORMAL HIGH (ref 70–99)

## 2013-08-13 SURGERY — SIGMOIDOSCOPY, FLEXIBLE
Anesthesia: Moderate Sedation | Laterality: Left

## 2013-08-13 MED ORDER — FENTANYL CITRATE 0.05 MG/ML IJ SOLN
INTRAMUSCULAR | Status: AC
  Filled 2013-08-13: qty 2

## 2013-08-13 MED ORDER — MIDAZOLAM HCL 10 MG/2ML IJ SOLN
INTRAMUSCULAR | Status: AC
  Filled 2013-08-13: qty 2

## 2013-08-13 MED ORDER — MIDAZOLAM HCL 10 MG/2ML IJ SOLN
INTRAMUSCULAR | Status: DC | PRN
Start: 2013-08-13 — End: 2013-08-13
  Administered 2013-08-13 (×2): 1 mg via INTRAVENOUS

## 2013-08-13 NOTE — Progress Notes (Signed)
Subjective: Patient was just a small amount of dark red blood on toilet paper earlier this morning.  Bowel movement yesterday apparently without blood.  Hemoglobin stable.  Continue suppositories.  If cleared by GI today, possibly discharge home this afternoon  Objective: Weight change: -4.7 oz (-0.134 kg)  Intake/Output Summary (Last 24 hours) at 08/13/13 0721 Last data filed at 08/12/13 2215  Gross per 24 hour  Intake    640 ml  Output      0 ml  Net    640 ml   Filed Vitals:   08/12/13 1402 08/12/13 2147 08/12/13 2215 08/13/13 0432  BP: 115/60 120/70  135/60  Pulse: 58 52 62 61  Temp: 97.9 F (36.6 C) 97.6 F (36.4 C)  97.7 F (36.5 C)  TempSrc: Oral Oral  Oral  Resp: _0 Height:      Weight:    160 lb 3.2 oz (72.666 kg)  SpO2: 97% 99%  99%    General Appearance: Alert, cooperative, no distress, appears stated age Lungs: Clear to auscultation bilaterally, respirations unlabored Heart: Regular rate and rhythm, S1 and S2 normal, no murmur, rub or gallop Abdomen: Soft, non-tender, bowel sounds active all four quadrants, no masses, no organomegaly Extremities: Extremities normal, atraumatic, no cyanosis or edema Neuro: Alert and oriented x3, nonfocal  Lab Results: Results for orders placed during the hospital encounter of 08/10/13 (from the past 48 hour(s))  GLUCOSE, CAPILLARY     Status: None   Collection Time    08/11/13  8:06 AM      Result Value Ref Range   Glucose-Capillary 97  70 - 99 mg/dL   Comment 1 Documented in Chart     Comment 2 Notify RN    CBC     Status: Abnormal   Collection Time    08/11/13  3:59 PM      Result Value Ref Range   WBC 6.2  4.0 - 10.5 K/uL   RBC 3.92  3.87 - 5.11 MIL/uL   Hemoglobin 12.0  12.0 - 15.0 g/dL   HCT 36.4  36.0 - 46.0 %   MCV 92.9  78.0 - 100.0 fL   MCH 30.6  26.0 - 34.0 pg   MCHC 33.0  30.0 - 36.0 g/dL   RDW 13.4  11.5 - 15.5 %   Platelets 143 (*) 150 - 400 K/uL  CBC WITH DIFFERENTIAL     Status: Abnormal   Collection Time    08/12/13  4:24 AM      Result Value Ref Range   WBC 6.6  4.0 - 10.5 K/uL   Comment: WHITE COUNT CONFIRMED ON SMEAR   RBC 3.64 (*) 3.87 - 5.11 MIL/uL   Hemoglobin 11.4 (*) 12.0 - 15.0 g/dL   HCT 33.3 (*) 36.0 - 46.0 %   MCV 91.5  78.0 - 100.0 fL   MCH 31.3  26.0 - 34.0 pg   MCHC 34.2  30.0 - 36.0 g/dL   RDW 13.4  11.5 - 15.5 %   Platelets 163  150 - 400 K/uL   Neutrophils Relative % 51  43 - 77 %   Lymphocytes Relative 36  12 - 46 %   Monocytes Relative 13 (*) 3 - 12 %   Eosinophils Relative 0  0 - 5 %   Basophils Relative 0  0 - 1 %   Neutro Abs 3.3  1.7 - 7.7 K/uL   Lymphs Abs 2.4  0.7 - 4.0 K/uL  Monocytes Absolute 0.9  0.1 - 1.0 K/uL   Eosinophils Absolute 0.0  0.0 - 0.7 K/uL   Basophils Absolute 0.0  0.0 - 0.1 K/uL   Smear Review MORPHOLOGY UNREMARKABLE    GLUCOSE, CAPILLARY     Status: None   Collection Time    08/12/13  8:44 AM      Result Value Ref Range   Glucose-Capillary 94  70 - 99 mg/dL   Comment 1 Documented in Chart     Comment 2 Notify RN    BASIC METABOLIC PANEL     Status: Abnormal   Collection Time    08/13/13  3:58 AM      Result Value Ref Range   Sodium 136 (*) 137 - 147 mEq/L   Potassium 3.9  3.7 - 5.3 mEq/L   Comment: DELTA CHECK NOTED     REPEATED TO VERIFY   Chloride 100  96 - 112 mEq/L   CO2 25  19 - 32 mEq/L   Glucose, Bld 125 (*) 70 - 99 mg/dL   BUN 16  6 - 23 mg/dL   Creatinine, Ser 1.42 (*) 0.50 - 1.10 mg/dL   Calcium 9.4  8.4 - 10.5 mg/dL   GFR calc non Af Amer 31 (*) >90 mL/min   GFR calc Af Amer 35 (*) >90 mL/min   Comment: (NOTE)     The eGFR has been calculated using the CKD EPI equation.     This calculation has not been validated in all clinical situations.     eGFR's persistently <90 mL/min signify possible Chronic Kidney     Disease.   Anion gap 11  5 - 15  CBC WITH DIFFERENTIAL     Status: Abnormal   Collection Time    08/13/13  4:17 AM      Result Value Ref Range   WBC 6.8  4.0 - 10.5 K/uL   RBC 3.74  (*) 3.87 - 5.11 MIL/uL   Hemoglobin 11.8 (*) 12.0 - 15.0 g/dL   HCT 34.4 (*) 36.0 - 46.0 %   MCV 92.0  78.0 - 100.0 fL   MCH 31.6  26.0 - 34.0 pg   MCHC 34.3  30.0 - 36.0 g/dL   RDW 13.3  11.5 - 15.5 %   Platelets 135 (*) 150 - 400 K/uL   Neutrophils Relative % 63  43 - 77 %   Neutro Abs 4.2  1.7 - 7.7 K/uL   Lymphocytes Relative 27  12 - 46 %   Lymphs Abs 1.9  0.7 - 4.0 K/uL   Monocytes Relative 10  3 - 12 %   Monocytes Absolute 0.7  0.1 - 1.0 K/uL   Eosinophils Relative 0  0 - 5 %   Eosinophils Absolute 0.0  0.0 - 0.7 K/uL   Basophils Relative 0  0 - 1 %   Basophils Absolute 0.0  0.0 - 0.1 K/uL    Studies/Results: No results found. Medications: Scheduled Meds: . antiseptic oral rinse  15 mL Mouth Rinse q12n4p  . atenolol  25 mg Oral Daily  . chlorhexidine  15 mL Mouth Rinse BID  . citalopram  40 mg Oral Daily  . clotrimazole  1 application Topical BID  . estrogens (conjugated)  0.3 mg Oral Daily  . feeding supplement (RESOURCE BREEZE)  1 Container Oral Q24H  . furosemide  40 mg Oral Daily  . hydrocortisone  25 mg Rectal BID  . isosorbide mononitrate  30 mg Oral Daily  .  multivitamin-prenatal  1 tablet Oral Daily  . pantoprazole (PROTONIX) IV  40 mg Intravenous Q12H  . potassium chloride  10 mEq Oral TID   Continuous Infusions:  PRN Meds:.acetaminophen, acetaminophen, ketoconazole, loperamide, meclizine, ondansetron (ZOFRAN) IV, ondansetron, polyvinyl alcohol, triamcinolone cream  Assessment/Plan:  Principal Problem:  Rectal bleed - only small amount of blood on toilet paper when wiping early this morning around 3 AM.  Telemetry had bowel movement without blood yesterday.  Bleeding seems to be slowing down definitely.  Question is whether or not to perform an imaging study of her: Prior to discharge.  We'll defer to GI.  Possible discharge home later today  Active Problems:  Essential hypertension, benign - controlled  Coronary artery disease - no chest pain,  asymptomatic  Severe lumbar DJD with scoliosis  Chronic right lower extremity sciatica  Anxiety/depression  Hearing loss with hearing aids  History of CHF with diastolic dysfunction, Dr. Casandra Doffing, 2014  Acute renal failure - resolved  Disposition - hopefully home in the next 24-48 hours    LOS: 3 days   Henrine Screws, MD 08/13/2013, 7:21 AM

## 2013-08-13 NOTE — Consult Note (Signed)
[facility] Surgery Consult Note  SION THANE [birth year]  [id number].    Requesting MD: Dr. Paulita Fujita Chief Complaint/Reason for Consult: Grade 3 internal bleeding hemorrhoids  HPI:  80 year old female with history of HTN, CAD who presented to [facility] ED 08/10/2013 with complaints of rectal bleed for few days now, but progressively worse 24 hours prior to admission.  Patient also reported associated some dizziness, lightheadedness, minor SOB.  No loss of consciousness.  No abdominal pain, nausea or vomiting. No diarrhea. No reports of fevers or chills. No chest pain, or palpitations.  The patient was seen by GI and a sigmoidoscopy performed.  Dr. Paulita Fujita found Grade 3 internal bleeding hemorrhoids.  Her Hgb is 11.8, but she remains symptomatic.  Bleeding was not alleviated by Anusol.  Patient notes she had a hemorrhoid removed in 1945 when it was packed for several weeks.  We were asked to see if hemorrhoid banding was possible while in hospital.     ROS: All systems reviewed and otherwise negative except for as above  Family History  Problem Relation Age of Onset  . Heart disease Daughter   . Heart disease Son   . Heart disease Father   . Heart disease Mother   . Cancer Brother   . Heart disease Sister     Past Medical History  Diagnosis Date  . Hyperglycemia   . CAD (coronary artery disease)   . Pneumonia     july 2009  . Hyperlipidemia   . Hormone replacement therapy (postmenopausal)   . Diverticulosis   . DJD (degenerative joint disease) of lumbar spine     scoliosis  . DJD (degenerative joint disease) of knee   . Sciatica     right lower extremity  . Allergic rhinitis   . Reactive depression (situational)   . Shingles     right thoracic 2008  . Eczema   . GI bleed   . Diverticular disease   . Esophageal erosions     from nsaid   . Cough   . Orthopnea   . Vertigo   . CHF (congestive heart failure)   . Hypertension     Past Surgical History  Procedure  Laterality Date  . Back surgery    . Total abdominal hysterectomy    . Breast lumpectomy    . Appendectomy      Social History:  reports that she has never smoked. She has never used smokeless tobacco. She reports that she drinks alcohol. She reports that she does not use illicit drugs.  Allergies:  Allergies  Allergen Reactions  . Ace Inhibitors Hives and Itching  . Penicillins     Pruritic rash  . Streptomycin     Pruritus   . Tramadol     unknown    Medications Prior to Admission  Medication Sig Dispense Refill  . aspirin EC 81 MG tablet Take 81 mg by mouth every Monday, Wednesday, and Friday.      Marland Kitchen atenolol (TENORMIN) 25 MG tablet Take 25 mg by mouth daily.      . citalopram (CELEXA) 40 MG tablet Take 40 mg by mouth daily.        . clotrimazole (LOTRIMIN) 1 % cream Apply 1 application topically 2 (two) times daily.      Marland Kitchen estrogens, conjugated, (PREMARIN) 0.3 MG tablet Take 0.3 mg by mouth daily.       . furosemide (LASIX) 40 MG tablet Take 40 mg by mouth daily.       Marland Kitchen  isosorbide mononitrate (IMDUR) 30 MG 24 hr tablet Take 1 tablet (30 mg total) by mouth daily.  90 tablet  3  . ketoconazole (NIZORAL) 2 % cream Apply 1 application topically 2 (two) times daily as needed for irritation.      Marland Kitchen loperamide (AF-LOPERAMIDE HCL) 2 MG tablet Take 2 mg by mouth as needed for diarrhea or loose stools. 1 capsule up to 8 tablets per 24hour 8 times a day      . meclizine (ANTIVERT) 25 MG tablet Take 25 mg by mouth 2 (two) times daily as needed for dizziness or nausea.       Vladimir Faster Glycol-Propyl Glycol (SYSTANE) 0.4-0.3 % SOLN Apply 1 drop to eye 3 (three) times daily as needed (dry eyes).      . potassium chloride (KLOR-CON) 10 MEQ CR tablet Take 10 mEq by mouth 3 (three) times daily.       . Prenatal Vit-Fe Fumarate-FA (MULTIVITAMIN-PRENATAL) 27-0.8 MG TABS Take 1 tablet by mouth daily.        Marland Kitchen triamcinolone cream (KENALOG) 0.1 % Apply 1 application topically 2 (two) times daily  as needed (rash).         Blood pressure 136/65, pulse 52, temperature 97.7 F (36.5 C), temperature source Oral, resp. rate 14, height 5' 5"  (1.651 m), weight 160 lb 3.2 oz (72.666 kg), SpO2 94.00%. Physical Exam: General: pleasant, WD/WN female who is laying in bed in NAD HEENT: head is normocephalic, atraumatic.  Sclera are noninjected.  PERRL.  Ears and nose without any masses or lesions.  Mouth is pink and moist Heart: regular, rate, and rhythm.  No obvious murmurs, gallops, or rubs noted.  Palpable pedal pulses bilaterally Lungs: CTAB, no wheezes, rhonchi, or rales noted.  Respiratory effort nonlabored Abd: soft, NT/ND, +BS, no masses, hernias, or organomegaly Rectum:  Soft non-bleeding/non-thrombosed external hemorrhoids, internal exam deferred secondary to just having sigmoidoscopy MS: all 4 extremities are symmetrical with no cyanosis, clubbing, or edema. Skin: warm and dry, but pale with no masses, lesions, or rashes Psych: A&Ox3 with an appropriate affect.     Results for orders placed during the hospital encounter of 08/10/13 (from the past 48 hour(s))  CBC     Status: Abnormal   Collection Time    08/11/13  3:59 PM      Result Value Ref Range   WBC 6.2  4.0 - 10.5 K/uL   RBC 3.92  3.87 - 5.11 MIL/uL   Hemoglobin 12.0  12.0 - 15.0 g/dL   HCT 36.4  36.0 - 46.0 %   MCV 92.9  78.0 - 100.0 fL   MCH 30.6  26.0 - 34.0 pg   MCHC 33.0  30.0 - 36.0 g/dL   RDW 13.4  11.5 - 15.5 %   Platelets 143 (*) 150 - 400 K/uL  CBC WITH DIFFERENTIAL     Status: Abnormal   Collection Time    08/12/13  4:24 AM      Result Value Ref Range   WBC 6.6  4.0 - 10.5 K/uL   Comment: WHITE COUNT CONFIRMED ON SMEAR   RBC 3.64 (*) 3.87 - 5.11 MIL/uL   Hemoglobin 11.4 (*) 12.0 - 15.0 g/dL   HCT 33.3 (*) 36.0 - 46.0 %   MCV 91.5  78.0 - 100.0 fL   MCH 31.3  26.0 - 34.0 pg   MCHC 34.2  30.0 - 36.0 g/dL   RDW 13.4  11.5 - 15.5 %   Platelets 163  150 - 400 K/uL   Neutrophils Relative % 51  43 - 77 %    Lymphocytes Relative 36  12 - 46 %   Monocytes Relative 13 (*) 3 - 12 %   Eosinophils Relative 0  0 - 5 %   Basophils Relative 0  0 - 1 %   Neutro Abs 3.3  1.7 - 7.7 K/uL   Lymphs Abs 2.4  0.7 - 4.0 K/uL   Monocytes Absolute 0.9  0.1 - 1.0 K/uL   Eosinophils Absolute 0.0  0.0 - 0.7 K/uL   Basophils Absolute 0.0  0.0 - 0.1 K/uL   Smear Review MORPHOLOGY UNREMARKABLE    GLUCOSE, CAPILLARY     Status: None   Collection Time    08/12/13  8:44 AM      Result Value Ref Range   Glucose-Capillary 94  70 - 99 mg/dL   Comment 1 Documented in Chart     Comment 2 Notify RN    BASIC METABOLIC PANEL     Status: Abnormal   Collection Time    08/13/13  3:58 AM      Result Value Ref Range   Sodium 136 (*) 137 - 147 mEq/L   Potassium 3.9  3.7 - 5.3 mEq/L   Comment: DELTA CHECK NOTED     REPEATED TO VERIFY   Chloride 100  96 - 112 mEq/L   CO2 25  19 - 32 mEq/L   Glucose, Bld 125 (*) 70 - 99 mg/dL   BUN 16  6 - 23 mg/dL   Creatinine, Ser 1.42 (*) 0.50 - 1.10 mg/dL   Calcium 9.4  8.4 - 10.5 mg/dL   GFR calc non Af Amer 31 (*) >90 mL/min   GFR calc Af Amer 35 (*) >90 mL/min   Comment: (NOTE)     The eGFR has been calculated using the CKD EPI equation.     This calculation has not been validated in all clinical situations.     eGFR's persistently <90 mL/min signify possible Chronic Kidney     Disease.   Anion gap 11  5 - 15  CBC WITH DIFFERENTIAL     Status: Abnormal   Collection Time    08/13/13  4:17 AM      Result Value Ref Range   WBC 6.8  4.0 - 10.5 K/uL   RBC 3.74 (*) 3.87 - 5.11 MIL/uL   Hemoglobin 11.8 (*) 12.0 - 15.0 g/dL   HCT 34.4 (*) 36.0 - 46.0 %   MCV 92.0  78.0 - 100.0 fL   MCH 31.6  26.0 - 34.0 pg   MCHC 34.3  30.0 - 36.0 g/dL   RDW 13.3  11.5 - 15.5 %   Platelets 135 (*) 150 - 400 K/uL   Neutrophils Relative % 63  43 - 77 %   Neutro Abs 4.2  1.7 - 7.7 K/uL   Lymphocytes Relative 27  12 - 46 %   Lymphs Abs 1.9  0.7 - 4.0 K/uL   Monocytes Relative 10  3 - 12 %    Monocytes Absolute 0.7  0.1 - 1.0 K/uL   Eosinophils Relative 0  0 - 5 %   Eosinophils Absolute 0.0  0.0 - 0.7 K/uL   Basophils Relative 0  0 - 1 %   Basophils Absolute 0.0  0.0 - 0.1 K/uL  GLUCOSE, CAPILLARY     Status: Abnormal   Collection Time    08/13/13  7:34 AM  Result Value Ref Range   Glucose-Capillary 108 (*) 70 - 99 mg/dL   Comment 1 Documented in Chart     Comment 2 Notify RN     No results found.    Assessment/Plan Grade 3 internal bleeding hemorrhoids as seen by sigmoidoscopy (Dr. Paulita Fujita) Lower GI Bleeding ABL Anemia likely on chronic  Plan: 1.  GI bleeding, but Hgb is stable at 11.8.   2.  Will attempt to get hemorrhoid banding tray from central supply, if I can find the supplies then Dr. Marcello Moores is willing to do it while she is here at bedside.  If not then she will have to come to the office in a few days to schedule and OP banding procedure.      Coralie Keens, [facility] Surgery 08/13/2013, 2:42 PM Pager: [phone]

## 2013-08-13 NOTE — Consult Note (Signed)
ATTENDING ADDENDUM:  I personally reviewed patient's record, examined the patient, and formulated the following assessment and plan:  Band placed on L lateral hemorrhoid.  Pt given post procedure instructions verbally and in discharge paper work.  Follow up with me in the office in 3 weeks.  Pt to expect a feeling of pressure for the 1st 24-48h with some bleeding.  Will sign off.  Please call with questions or concerns.

## 2013-08-13 NOTE — Progress Notes (Signed)
Subjective: Small amount of blood when wiping. No abdominal pain.  Objective: Vital signs in last 24 hours: Temp:  [97.6 F (36.4 C)-97.9 F (36.6 C)] 97.7 F (36.5 C) (07/17 0432) Pulse Rate:  [52-62] 61 (07/17 0432) Resp:  [16] 16 (07/17 0432) BP: (115-135)/(60-70) 135/60 mmHg (07/17 0432) SpO2:  [97 %-99 %] 99 % (07/17 0432) Weight:  [72.666 kg (160 lb 3.2 oz)] 72.666 kg (160 lb 3.2 oz) (07/17 0432) Weight change: -0.134 kg (-4.7 oz) Last BM Date: 08/12/13  PE: GEN:  Much younger-appearing than stated age, NAD ABD:  Soft  Lab Results: CBC    Component Value Date/Time   WBC 6.8 08/13/2013 0417   RBC 3.74* 08/13/2013 0417   HGB 11.8* 08/13/2013 0417   HCT 34.4* 08/13/2013 0417   PLT 135* 08/13/2013 0417   MCV 92.0 08/13/2013 0417   MCH 31.6 08/13/2013 0417   MCHC 34.3 08/13/2013 0417   RDW 13.3 08/13/2013 0417   LYMPHSABS 1.9 08/13/2013 0417   MONOABS 0.7 08/13/2013 0417   EOSABS 0.0 08/13/2013 0417   BASOSABS 0.0 08/13/2013 0417   Assessment:  1. Hematochezia. Mild, intermittent. Current volume doesn't seem consistent with diverticulosis. Might have some hemorrhoidal bleeding (moderate internal hemorrhoids were seen on colonoscopy in 2010).   Plan:  1.  Patient is concerned that a treatable bleeding source is being missed.  I don't think this bleeding is diverticular.  However, could be hemorrhoidal or even stercoral ulcer.  Accordingly, will plan on flexible sigmoidoscopy around noon today. 2.  If sigmoidoscopy today is unrevealing, patient can probably go home today. 3.  Will follow.   Landry Dyke 08/13/2013, 8:37 AM

## 2013-08-13 NOTE — H&P (View-Only) (Signed)
Subjective: Small amount of blood when wiping. No abdominal pain.  Objective: Vital signs in last 24 hours: Temp:  [97.6 F (36.4 C)-97.9 F (36.6 C)] 97.7 F (36.5 C) (07/17 0432) Pulse Rate:  [52-62] 61 (07/17 0432) Resp:  [16] 16 (07/17 0432) BP: (115-135)/(60-70) 135/60 mmHg (07/17 0432) SpO2:  [97 %-99 %] 99 % (07/17 0432) Weight:  [72.666 kg (160 lb 3.2 oz)] 72.666 kg (160 lb 3.2 oz) (07/17 0432) Weight change: -0.134 kg (-4.7 oz) Last BM Date: 08/12/13  PE: GEN:  Much younger-appearing than stated age, NAD ABD:  Soft  Lab Results: CBC    Component Value Date/Time   WBC 6.8 08/13/2013 0417   RBC 3.74* 08/13/2013 0417   HGB 11.8* 08/13/2013 0417   HCT 34.4* 08/13/2013 0417   PLT 135* 08/13/2013 0417   MCV 92.0 08/13/2013 0417   MCH 31.6 08/13/2013 0417   MCHC 34.3 08/13/2013 0417   RDW 13.3 08/13/2013 0417   LYMPHSABS 1.9 08/13/2013 0417   MONOABS 0.7 08/13/2013 0417   EOSABS 0.0 08/13/2013 0417   BASOSABS 0.0 08/13/2013 0417   Assessment:  1. Hematochezia. Mild, intermittent. Current volume doesn't seem consistent with diverticulosis. Might have some hemorrhoidal bleeding (moderate internal hemorrhoids were seen on colonoscopy in 2010).   Plan:  1.  Patient is concerned that a treatable bleeding source is being missed.  I don't think this bleeding is diverticular.  However, could be hemorrhoidal or even stercoral ulcer.  Accordingly, will plan on flexible sigmoidoscopy around noon today. 2.  If sigmoidoscopy today is unrevealing, patient can probably go home today. 3.  Will follow.   Landry Dyke 08/13/2013, 8:37 AM

## 2013-08-13 NOTE — Op Note (Signed)
[facility] [place], [address]   FLEXIBLE SIGMOIDOSCOPY PROCEDURE REPORT  PATIENT: [name], [name]  MR#: [id number] BIRTHDATE: 1916-02-10 , 89  yrs. old GENDER: Female ENDOSCOPIST: Arta Silence, MD REFERRED BY: R.  Marcellus Scott, M.D. PROCEDURE DATE:  08/13/2013 PROCEDURE:   Sigmoidoscopy, diagnostic ASA CLASS:   Class III INDICATIONS:hematochezia. MEDICATIONS: Versed 2 mg IV  DESCRIPTION OF PROCEDURE:   After the risks benefits and alternatives of the procedure were thoroughly explained, informed consent was obtained.  rectal exam revealed prolapsing internal hemorrhoids with some bleeding The pediatric  forward-vieweing gastroscope was introduced through the anus  and advanced to the distal sigmoid colon , limited by No adverse events experienced. The quality of the prep was fair .  The instrument was then slowly withdrawn as the mucosa was fully examined.     Rectal exam:  Blood oozing from prolapsing internal hemorrhoids, noted on rectal exam.  No perianal mass.  Normal anal sphincter tone.  Couple large internal hemorrhoids, one with a couple red spots, seen upon retroflexion into the rectum and upon slow withdrawal of the scope through the anal canal.  Rectum otherwise normal, with some limitations due to stool burden.  Distal sigmoid views limited by stool, but grossly normal.  No blood seen anywhere except at the rectal outlet.             The scope was then withdrawn from the patient and the procedure terminated.  COMPLICATIONS: There were no complications.  ENDOSCOPIC IMPRESSION: Grade III internal hemorrhoids, with bleeding.  RECOMMENDATIONS: 1.  Watch for potential complications of procedure. 2.  No improvement with Anusol-HC suppositories, so will call general surgery for consideration of anoscopy with banding.  I doubt her bleeding will resolve enough for her to be discharged with only medical therapies. 3.  Case  discussed directly with Dr. Inda Merlin.  REPEAT EXAM:   _______________________________ Lorrin MaisArta Silence, MD 08/13/2013 1:01 PM   CC:

## 2013-08-13 NOTE — Plan of Care (Addendum)
Problem: Phase III Progression Outcomes Goal: Other Phase III Outcomes/Goals Outcome: Progressing Patient with dark red blood on toilet tissue when wiping rectal area. Denies pain and nausea sea

## 2013-08-13 NOTE — Interval H&P Note (Signed)
History and Physical Interval Note:  08/13/2013 12:40 PM  [name]  has presented today for surgery, with the diagnosis of Blood in stool  The various methods of treatment have been discussed with the patient and family. After consideration of risks, benefits and other options for treatment, the patient has consented to  Procedure(s): FLEXIBLE SIGMOIDOSCOPY (Left) as a surgical intervention .  The patient's history has been reviewed, patient examined, no change in status, stable for surgery.  I have reviewed the patient's chart and labs.  Questions were answered to the patient's satisfaction.     [name] M  Assessment:  1.  Hematochezia, suspect hemorrhoidal.  Plan:  1.  Flexible sigmoidoscopy for further evaluation. 2.  Risks (bleeding, infection, bowel perforation that could require surgery, sedation-related changes in cardiopulmonary systems), benefits (identification and possible treatment of source of symptoms, exclusion of certain causes of symptoms), and alternatives (watchful waiting, radiographic imaging studies, empiric medical treatment) of flexible sigmoidoscopy were explained to patient/family in detail and patient wishes to proceed.

## 2013-08-13 NOTE — Procedures (Signed)
The anatomy & physiology of the anorectal region was discussed.  The pathophysiology of hemorrhoids and differential diagnosis was discussed.  Natural history progression  was discussed.   I stressed the importance of a bowel regimen to have daily soft bowel movements to minimize progression of disease.     The patient's symptoms are not adequately controlled.  Therefore, I recommended banding to treat the hemorrhoids.  I went over the technique, risks, benefits, and alternatives.   Goals of post-operative recovery were discussed as well.  Questions were answered.  The patient expressed understanding & wished to proceed.  The patient was positioned in the prone position on the proctology table.  Perianal & rectal examination was done.  Using anoscopy, I ligated the left lateral hemorrhoid above the dentate line with banding.  The patient tolerated the procedure well.  Educational handouts further explaining the pathology, treatment options, and bowel regimen were given as well.

## 2013-08-14 LAB — GLUCOSE, CAPILLARY: Glucose-Capillary: 104 mg/dL — ABNORMAL HIGH (ref 70–99)

## 2013-08-14 NOTE — Discharge Instructions (Signed)
Patient Information following hemorrhoid banding  Hemorrhoid banding is a procedure that places a small rubber band around a hemorrhoid, causing it to clot and then break off and be passed in the stool.  This is a minor procedure, but problems may develop in rare cases.  The following are warning symptoms and signs that should alert you to a possible complication.  Please contact the office if any of these should occur:   Increased pain with bowel movements or sitting  Temperature over 100.4 F (oral)  Bleeding that is excessive (over one cup of clots or blood)  Redness or irritation outside the anus  Difficulty urinating  For your comfort please follow these instructions:   Maintain a high fiber diet so that your bowel movements will be soft  Take a fiber supplement twice a day (such as Metamucil, Benefiber, Citracel or Fibercon)  Sit in a tub of warm water 2-3 times a day for the first 2-3 days, as needed to soothe the area.  You may expect some pressure sensations in the anal area for 1-2 days  If you need pain medication, take Tylenol not aspirin or Ibuprofen products.  In 7-10 days, the banded tissue and rubber band will pass with your stool.  Occasionally there is some bleeding after this.  If this bleeding seems excessive, call the office immediately or go to the Emergency Room.    Make an appointment to see me in 2-3 weeks after the procedure Bloody Stools Bloody stools often mean that there is a problem in the digestive tract. Your caregiver may use the term "melena" to describe black, tarry, and bad smelling stools or "hematochezia" to describe red or maroon-colored stools. Blood seen in the stool can be caused by bleeding anywhere along the intestinal tract.  A black stool usually means that blood is coming from the upper part of the gastrointestinal tract (esophagus, stomach, or small bowel). Passing maroon-colored stools or bright red blood usually means that blood is  coming from lower down in the large bowel or the rectum. However, sometimes massive bleeding in the stomach or small intestine can cause bright red bloody stools.  Consuming black licorice, lead, iron pills, medicines containing bismuth subsalicylate, or blueberries can also cause black stools. Your caregiver can test black stools to see if blood is present. It is important that the cause of the bleeding be found. Treatment can then be started, and the problem can be corrected. Rectal bleeding may not be serious, but you should not assume everything is okay until you know the cause.It is very important to follow up with your caregiver or a specialist in gastrointestinal problems. CAUSES  Blood in the stools can come from various underlying causes.Often, the cause is not found during your first visit. Testing is often needed to discover the cause of bleeding in the gastrointestinal tract. Causes range from simple to serious or even life-threatening.Possible causes include: Hemorrhoids.These are veins that are full of blood (engorged) in the rectum. They cause pain, inflammation, and may bleed. Anal fissures.These are areas of painful tearing which may bleed. They are often caused by passing hard stool. Diverticulosis.These are pouches that form on the colon over time, with age, and may bleed significantly. Diverticulitis.This is inflammation in areas with diverticulosis. It can cause pain, fever, and bloody stools, although bleeding is rare. Proctitis and colitis. These are inflamed areas of the rectum or colon. They may cause pain, fever, and bloody stools. Polyps and cancer. Colon cancer is a  leading cause of preventable cancer death.It often starts out as precancerous polyps that can be removed during a colonoscopy, preventing progression into cancer. Sometimes, polyps and cancer may cause rectal bleeding. Gastritis and ulcers.Bleeding from the upper gastrointestinal tract (near the stomach)  may travel through the intestines and produce black, sometimes tarry, often bad smelling stools. In certain cases, if the bleeding is fast enough, the stools may not be black, but red and the condition may be life-threatening. SYMPTOMS  You may have stools that are bright red and bloody, that are normal color with blood on them, or that are dark black and tarry. In some cases, you may only have blood in the toilet bowl. Any of these cases need medical care. You may also have: Pain at the anus or anywhere in the rectum. Lightheadedness or feeling faint. Extreme weakness. Nausea or vomiting. Fever. DIAGNOSIS Your caregiver may use the following methods to find the cause of your bleeding: Taking a medical history. Age is important. Older people tend to develop polyps and cancer more often. If there is anal pain and a hard, large stool associated with bleeding, a tear of the anus may be the cause. If blood drips into the toilet after a bowel movement, bleeding hemorrhoids may be the problem. The color and frequency of the bleeding are additional considerations. In most cases, the medical history provides clues, but seldom the final answer. A visual and finger (digital) exam. Your caregiver will inspect the anal area, looking for tears and hemorrhoids. A finger exam can provide information when there is tenderness or a growth inside. In men, the prostate is also examined. Endoscopy. Several types of small, long scopes (endoscopes) are used to view the colon. In the office, your caregiver may use a rigid, or more commonly, a flexible viewing sigmoidoscope. This exam is called flexible sigmoidoscopy. It is performed in 5 to 10 minutes. A more thorough exam is accomplished with a colonoscope. It allows your caregiver to view the entire 5 to 6 foot long colon. Medicine to help you relax (sedative) is usually given for this exam. Frequently, a bleeding lesion may be present beyond the reach of the sigmoidoscope.  So, a colonoscopy may be the best exam to start with. Both exams are usually done on an outpatient basis. This means the patient does not stay overnight in the hospital or surgery center. An upper endoscopy may be needed to examine your stomach. Sedation is used and a flexible endoscope is put in your mouth, down to your stomach. A barium enema X-ray. This is an X-ray exam. It uses liquid barium inserted by enema into the rectum. This test alone may not identify an actual bleeding point. X-rays highlight abnormal shadows, such as those made by lumps (tumors), diverticuli, or colitis. TREATMENT  Treatment depends on the cause of your bleeding.  For bleeding from the stomach or colon, the caregiver doing your endoscopy or colonoscopy may be able to stop the bleeding as part of the procedure. Inflammation or infection of the colon can be treated with medicines. Many rectal problems can be treated with creams, suppositories, or warm baths. Surgery is sometimes needed. Blood transfusions are sometimes needed if you have lost a lot of blood. For any bleeding problem, let your caregiver know if you take aspirin or other blood thinners regularly. HOME CARE INSTRUCTIONS  Take any medicines exactly as prescribed. Keep your stools soft by eating a diet high in fiber. Prunes (1 to 3 a day) work well  for many people. Drink enough water and fluids to keep your urine clear or pale yellow. Take sitz baths if advised. A sitz bath is when you sit in a bathtub with warm water for 10 to 15 minutes to soak, soothe, and cleanse the rectal area. If enemas or suppositories are advised, be sure you know how to use them. Tell your caregiver if you have problems with this. Monitor your bowel movements to look for signs of improvement or worsening. SEEK MEDICAL CARE IF:  You do not improve in the time expected. Your condition worsens after initial improvement. You develop any new symptoms. SEEK IMMEDIATE MEDICAL CARE IF:   You develop severe or prolonged rectal bleeding. You vomit blood. You feel weak or faint. You have a fever. MAKE SURE YOU: Understand these instructions. Will watch your condition. Will get help right away if you are not doing well or get worse. Document Released: 01/04/2002 Document Revised: 04/08/2011 Document Reviewed: 06/01/2010 [facility] Patient Information 2015 [place], [place]. This information is not intended to replace advice given to you by your health care provider. Make sure you discuss any questions you have with your health care provider.

## 2013-08-14 NOTE — Progress Notes (Signed)
Patient d/c home. Stable.- Bader Stubblefield RN 

## 2013-08-14 NOTE — Progress Notes (Signed)
Patient d/c instructions discussed with patient,.verbalized needs.No active bleeding. In good spirit. No c/o pain. Stable. Sandie Ano RN

## 2013-08-14 NOTE — Discharge Summary (Signed)
Physician Discharge Summary  Patient ID: [name] MRN: [id number] DOB/AGE: 1916-05-31 80 y.o.  Admit date: 08/10/2013 Discharge date: 08/14/2013  Admission Diagnoses:  Discharge Diagnoses:  Principal Problem:   Rectal bleed Active Problems:   Essential hypertension, benign   Acute renal failure   Discharged Condition: stable  Hospital Course:   Patient presented to the hospital with complaint of blood per rectum. She was evaluated in the emergency room, admission was deemed necessary for further evaluation and treatment. Her hemoglobin was stable. She continued to have hematochezia. She was seen in consultation by GI, ultimately flexible sigmoidoscopy was performed which showed internal hemorrhoid that was bleeding. She was seen by general surgery and had banding of hemorrhoid with resolution of bleeding. Patient remained hemodynamically stable throughout this hospitalization. No transfusion was indicated. She will continue to hold her aspirin and followup with PCP in one week. Consults: Treatment Team:  Arta Silence, MD Josetta Huddle, MD  Significant Diagnostic Studies:Ct Abdomen Pelvis W Contrast  08/10/2013   CLINICAL DATA:  Bright red blood per rectum, abdominal pain  EXAM: CT ABDOMEN AND PELVIS WITH CONTRAST  TECHNIQUE: Multidetector CT imaging of the abdomen and pelvis was performed using the standard protocol following bolus administration of intravenous contrast.  CONTRAST:  91mL OMNIPAQUE IOHEXOL 300 MG/ML SOLN, 169mL OMNIPAQUE IOHEXOL 300 MG/ML SOLN  COMPARISON:  07/05/2013  FINDINGS: The lung bases are free of acute infiltrate or sizable effusion. A large hiatal hernia is again identified.  The liver, spleen, gallbladder, adrenal glands and pancreas are normal in their CT appearance. Prominence of the common bile duct is again identified and stable. This likely within normal limits given the patient's age.  The kidneys are well visualized and demonstrate a normal  enhancement pattern. Small cystic lesions are noted bilaterally. No obstructive changes are seen. No calculi are noted.  Significant colonic diverticulosis is noted. No findings to suggest diverticulitis are seen. No focal mass lesion is seen. The bladder is well distended. No pelvic mass lesion is seen. The uterus has been surgically removed. The appendix is been surgically removed. Significant degenerative changes of the lumbar spine are again noted.  IMPRESSION: Chronic changes as described above similar this scan of the previous month. No acute abnormality is identified.   Electronically Signed   By: Inez Catalina M.D.   On: 08/10/2013 14:02      Discharge Exam: Blood pressure 123/74, pulse 83, temperature 98 F (36.7 C), temperature source Oral, resp. rate 14, height 5\' 5"  (1.651 m), weight 72.666 kg (160 lb 3.2 oz), SpO2 99.00%. General appearance: alert and cooperative GI: soft, non-tender; bowel sounds normal; no masses,  no organomegaly  Disposition: 01-Home or Self Care     Medication List    STOP taking these medications       aspirin EC 81 MG tablet      TAKE these medications       AF-LOPERAMIDE HCL 2 MG tablet  Generic drug:  loperamide  Take 2 mg by mouth as needed for diarrhea or loose stools. 1 capsule up to 8 tablets per 24hour 8 times a day     atenolol 25 MG tablet  Commonly known as:  TENORMIN  Take 25 mg by mouth daily.     citalopram 40 MG tablet  Commonly known as:  CELEXA  Take 40 mg by mouth daily.     clotrimazole 1 % cream  Commonly known as:  LOTRIMIN  Apply 1 application topically 2 (two) times daily.  estrogens (conjugated) 0.3 MG tablet  Commonly known as:  PREMARIN  Take 0.3 mg by mouth daily.     furosemide 40 MG tablet  Commonly known as:  LASIX  Take 40 mg by mouth daily.     isosorbide mononitrate 30 MG 24 hr tablet  Commonly known as:  IMDUR  Take 1 tablet (30 mg total) by mouth daily.     ketoconazole 2 % cream  Commonly  known as:  NIZORAL  Apply 1 application topically 2 (two) times daily as needed for irritation.     meclizine 25 MG tablet  Commonly known as:  ANTIVERT  Take 25 mg by mouth 2 (two) times daily as needed for dizziness or nausea.     multivitamin-prenatal 27-0.8 MG Tabs tablet  Take 1 tablet by mouth daily.     potassium chloride 10 MEQ CR tablet  Commonly known as:  KLOR-CON  Take 10 mEq by mouth 3 (three) times daily.     SYSTANE 0.4-0.3 % Soln  Generic drug:  Polyethyl Glycol-Propyl Glycol  Apply 1 drop to eye 3 (three) times daily as needed (dry eyes).     triamcinolone cream 0.1 %  Commonly known as:  KENALOG  Apply 1 application topically 2 (two) times daily as needed (rash).           Follow-up Information   Follow up with GATES,ROBERT NEVILL, MD In 1 week.   Specialty:  Internal Medicine   Contact information:   [address] [place] [address] [phone]       Signed: Kandice Hams 08/14/2013, 11:47 AM

## 2013-08-16 ENCOUNTER — Encounter (HOSPITAL_COMMUNITY): Payer: Self-pay | Admitting: Gastroenterology

## 2013-08-23 DIAGNOSIS — E876 Hypokalemia: Secondary | ICD-10-CM | POA: Diagnosis not present

## 2013-08-23 DIAGNOSIS — K648 Other hemorrhoids: Secondary | ICD-10-CM | POA: Diagnosis not present

## 2013-08-23 DIAGNOSIS — K625 Hemorrhage of anus and rectum: Secondary | ICD-10-CM | POA: Diagnosis not present

## 2013-08-23 DIAGNOSIS — I10 Essential (primary) hypertension: Secondary | ICD-10-CM | POA: Diagnosis not present

## 2013-08-26 ENCOUNTER — Encounter (HOSPITAL_COMMUNITY): Payer: Self-pay | Admitting: Emergency Medicine

## 2013-08-26 ENCOUNTER — Emergency Department (HOSPITAL_COMMUNITY)
Admission: EM | Admit: 2013-08-26 | Discharge: 2013-08-26 | Disposition: A | Discharge status: Home / Self Care | Payer: Medicare Other | Attending: Emergency Medicine | Admitting: Emergency Medicine

## 2013-08-26 ENCOUNTER — Telehealth (INDEPENDENT_AMBULATORY_CARE_PROVIDER_SITE_OTHER): Payer: Self-pay

## 2013-08-26 DIAGNOSIS — Z8619 Personal history of other infectious and parasitic diseases: Secondary | ICD-10-CM | POA: Insufficient documentation

## 2013-08-26 DIAGNOSIS — Z79899 Other long term (current) drug therapy: Secondary | ICD-10-CM | POA: Insufficient documentation

## 2013-08-26 DIAGNOSIS — I2510 Atherosclerotic heart disease of native coronary artery without angina pectoris: Secondary | ICD-10-CM | POA: Insufficient documentation

## 2013-08-26 DIAGNOSIS — Z8701 Personal history of pneumonia (recurrent): Secondary | ICD-10-CM | POA: Insufficient documentation

## 2013-08-26 DIAGNOSIS — Z8739 Personal history of other diseases of the musculoskeletal system and connective tissue: Secondary | ICD-10-CM | POA: Diagnosis not present

## 2013-08-26 DIAGNOSIS — R42 Dizziness and giddiness: Secondary | ICD-10-CM | POA: Diagnosis not present

## 2013-08-26 DIAGNOSIS — I509 Heart failure, unspecified: Secondary | ICD-10-CM | POA: Diagnosis not present

## 2013-08-26 DIAGNOSIS — Z9089 Acquired absence of other organs: Secondary | ICD-10-CM | POA: Diagnosis not present

## 2013-08-26 DIAGNOSIS — Z90710 Acquired absence of both cervix and uterus: Secondary | ICD-10-CM | POA: Insufficient documentation

## 2013-08-26 DIAGNOSIS — Z8709 Personal history of other diseases of the respiratory system: Secondary | ICD-10-CM | POA: Diagnosis not present

## 2013-08-26 DIAGNOSIS — K625 Hemorrhage of anus and rectum: Secondary | ICD-10-CM | POA: Diagnosis not present

## 2013-08-26 DIAGNOSIS — R0602 Shortness of breath: Secondary | ICD-10-CM | POA: Diagnosis not present

## 2013-08-26 DIAGNOSIS — K219 Gastro-esophageal reflux disease without esophagitis: Secondary | ICD-10-CM | POA: Insufficient documentation

## 2013-08-26 DIAGNOSIS — IMO0002 Reserved for concepts with insufficient information to code with codable children: Secondary | ICD-10-CM | POA: Diagnosis not present

## 2013-08-26 DIAGNOSIS — E785 Hyperlipidemia, unspecified: Secondary | ICD-10-CM | POA: Diagnosis not present

## 2013-08-26 DIAGNOSIS — I10 Essential (primary) hypertension: Secondary | ICD-10-CM | POA: Insufficient documentation

## 2013-08-26 DIAGNOSIS — Z880 Allergy status to penicillin: Secondary | ICD-10-CM | POA: Insufficient documentation

## 2013-08-26 DIAGNOSIS — Z9889 Other specified postprocedural states: Secondary | ICD-10-CM | POA: Diagnosis not present

## 2013-08-26 LAB — CBC WITH DIFFERENTIAL/PLATELET
Basophils Absolute: 0 10*3/uL (ref 0.0–0.1)
Basophils Relative: 0 % (ref 0–1)
EOS ABS: 0 10*3/uL (ref 0.0–0.7)
Eosinophils Relative: 0 % (ref 0–5)
HCT: 35.3 % — ABNORMAL LOW (ref 36.0–46.0)
HEMOGLOBIN: 12.1 g/dL (ref 12.0–15.0)
LYMPHS ABS: 1.9 10*3/uL (ref 0.7–4.0)
Lymphocytes Relative: 32 % (ref 12–46)
MCH: 31.5 pg (ref 26.0–34.0)
MCHC: 34.3 g/dL (ref 30.0–36.0)
MCV: 91.9 fL (ref 78.0–100.0)
MONOS PCT: 10 % (ref 3–12)
Monocytes Absolute: 0.6 10*3/uL (ref 0.1–1.0)
Neutro Abs: 3.5 10*3/uL (ref 1.7–7.7)
Neutrophils Relative %: 58 % (ref 43–77)
Platelets: 166 10*3/uL (ref 150–400)
RBC: 3.84 MIL/uL — AB (ref 3.87–5.11)
RDW: 13.5 % (ref 11.5–15.5)
WBC: 6 10*3/uL (ref 4.0–10.5)

## 2013-08-26 LAB — BASIC METABOLIC PANEL
Anion gap: 14 (ref 5–15)
BUN: 15 mg/dL (ref 6–23)
CO2: 23 mEq/L (ref 19–32)
Calcium: 9.4 mg/dL (ref 8.4–10.5)
Chloride: 101 mEq/L (ref 96–112)
Creatinine, Ser: 1.17 mg/dL — ABNORMAL HIGH (ref 0.50–1.10)
GFR calc Af Amer: 45 mL/min — ABNORMAL LOW (ref >90)
GFR, EST NON AFRICAN AMERICAN: 39 mL/min — AB (ref >90)
GLUCOSE: 97 mg/dL (ref 70–99)
POTASSIUM: 3.8 meq/L (ref 3.7–5.3)
Sodium: 138 mEq/L (ref 137–147)

## 2013-08-26 NOTE — Progress Notes (Signed)
AGree with above.  No evidence for significant bleeding.

## 2013-08-26 NOTE — Discharge Instructions (Signed)
Rectal Bleeding  Rectal bleeding is when blood comes out of the opening of the butt (anus). Rectal bleeding may show up as bright red blood or really dark poop (stool). The poop may look dark red, maroon, or black. Rectal bleeding is often a sign that something is wrong. This needs to be checked by a doctor.  HOME CARE  Eat a diet high in fiber. This will help keep your poop soft.  Limit activity.  Drink enough fluids to keep your pee (urine) clear or pale yellow.  Take a warm bath to soothe any pain.  Follow up with your doctor as told. GET HELP RIGHT AWAY IF:  You have more bleeding.  You have black or dark red poop.  You throw up (vomit) blood or it looks like coffee grounds.  You have belly (abdominal) pain or tenderness.  You have a fever.  You feel weak, sick to your stomach (nauseous), or you pass out (faint).  You have pain that is so bad you cannot poop (bowel movement). MAKE SURE YOU:  Understand these instructions.  Will watch your condition.  Will get help right away if you are not doing well or get worse. Document Released: 09/26/2010 Document Revised: 05/31/2013 Document Reviewed: 09/26/2010 [facility] Patient Information 2015 [place], [place]. This information is not intended to replace advice given to you by your health care provider. Make sure you discuss any questions you have with your health care provider.

## 2013-08-26 NOTE — Progress Notes (Signed)
Patient ID: [name], female   DOB: Mar 27, 1916, 80 y.o.   MRN: [id number]    Subjective: Pt had hemorrhoidal banding 10 days ago by Dr. Marcello Moores. She began having some intermittent in dark red bleeding this past weekend. It was stopped for a day or so and then started again. She would have some bleeding with bowel movements and occasionally some without bowel movements.  Objective: Vital signs in last 24 hours: Temp:  [97.7 F (36.5 C)] 97.7 F (36.5 C) (07/30 1309) Pulse Rate:  [63] 63 (07/30 1309) Resp:  [16] 16 (07/30 1309) BP: (136)/(63) 136/63 mmHg (07/30 1309) SpO2:  [100 %] 100 % (07/30 1309)    Intake/Output from previous day:   Intake/Output this shift:    PE: Rectal: No active bleeding  Lab Results:   Recent Labs  08/26/13 1340  WBC 6.0  HGB 12.1  HCT 35.3*  PLT 166   BMET  Recent Labs  08/26/13 1340  NA 138  K 3.8  CL 101  CO2 23  GLUCOSE 97  BUN 15  CREATININE 1.17*  CALCIUM 9.4   PT/INR No results found for this basename: LABPROT, INR,  in the last 72 hours CMP     Component Value Date/Time   NA 138 08/26/2013 1340   K 3.8 08/26/2013 1340   CL 101 08/26/2013 1340   CO2 23 08/26/2013 1340   GLUCOSE 97 08/26/2013 1340   BUN 15 08/26/2013 1340   CREATININE 1.17* 08/26/2013 1340   CALCIUM 9.4 08/26/2013 1340   PROT 6.4 08/11/2013 0433   ALBUMIN 3.0* 08/11/2013 0433   AST 17 08/11/2013 0433   ALT 9 08/11/2013 0433   ALKPHOS 55 08/11/2013 0433   BILITOT 0.3 08/11/2013 0433   GFRNONAA 39* 08/26/2013 1340   GFRAA 45* 08/26/2013 1340   Lipase     Component Value Date/Time   LIPASE 23 10/18/2008 1040       Studies/Results: No results found.  Anti-infectives: Anti-infectives   None       Assessment/Plan  1. Status post hemorrhoidal banding  Plan: 1. The patient has normal expected using after banding. I have discussed this with Dr. Marcello Moores. Her hemoglobin level is 12 and stable. She is okay for discharge home with follow Dr. Marcello Moores in  2 weeks.   LOS: 0 days    Glennda Weatherholtz E 08/26/2013, 3:24 PM Pager: [phone]

## 2013-08-26 NOTE — ED Provider Notes (Signed)
CSN: [id number]     Arrival date & time 08/26/13  1302 History   First MD Initiated Contact with Patient 08/26/13 1317     Chief Complaint  Patient presents with  . Rectal Bleeding     (Consider location/radiation/quality/duration/timing/severity/associated sxs/prior Treatment) HPI Comments: Patient presents with rectal bleeding. She was recently admitted by the hospitalist service on July 14th and discharged on the 18th for rectal bleeding. She was seen by GI who did a flexible sigmoidoscopy that showed only internal hemorrhoids. She was seen by Dr. Marcello Moores with general surgery to did a banding of a bleeding hemorrhoid. The bleeding stopped during the hospitalization and she was discharged home. She says since she was discharged, she's had ongoing bouts of bleeding which was worse last night. She had multiple episodes of dark red bleeding in her stool last night. She says that she's had increased fatigue and lightheadedness since discharge. She denies any chest pain that she has had some increase in her shortness of breath. She has had diverticulitis in the past but she did have a CT scan of the abdomen and pelvis on this recent admission which was negative for diverticulitis.  Patient is a 80 y.o. female presenting with hematochezia.  Rectal Bleeding Associated symptoms: abdominal pain (some soreness across the lower abdomen) and light-headedness   Associated symptoms: no dizziness, no fever and no vomiting     Past Medical History  Diagnosis Date  . Hyperglycemia   . CAD (coronary artery disease)   . Pneumonia     july 2009  . Hyperlipidemia   . Hormone replacement therapy (postmenopausal)   . Diverticulosis   . DJD (degenerative joint disease) of lumbar spine     scoliosis  . DJD (degenerative joint disease) of knee   . Sciatica     right lower extremity  . Allergic rhinitis   . Reactive depression (situational)   . Shingles     right thoracic 2008  . Eczema   . GI bleed   .  Diverticular disease   . Esophageal erosions     from nsaid   . Cough   . Orthopnea   . Vertigo   . CHF (congestive heart failure)   . Hypertension    Past Surgical History  Procedure Laterality Date  . Back surgery    . Total abdominal hysterectomy    . Breast lumpectomy    . Appendectomy    . Flexible sigmoidoscopy Left 08/13/2013    Procedure: FLEXIBLE SIGMOIDOSCOPY;  Surgeon: Arta Silence, MD;  Location: [facility];  Service: Endoscopy;  Laterality: Left;   Family History  Problem Relation Age of Onset  . Heart disease Daughter   . Heart disease Son   . Heart disease Father   . Heart disease Mother   . Cancer Brother   . Heart disease Sister    History  Substance Use Topics  . Smoking status: Never Smoker   . Smokeless tobacco: Never Used  . Alcohol Use: Yes     Comment: occasional cocktail   OB History   Grav Para Term Preterm Abortions TAB SAB Ect Mult Living                 Review of Systems  Constitutional: Negative for fever, chills, diaphoresis and fatigue.  HENT: Negative for congestion, rhinorrhea and sneezing.   Eyes: Negative.   Respiratory: Positive for shortness of breath. Negative for cough and chest tightness.   Cardiovascular: Negative for chest pain and leg  swelling.  Gastrointestinal: Positive for abdominal pain (some soreness across the lower abdomen), hematochezia and anal bleeding. Negative for nausea, vomiting, diarrhea and blood in stool.  Genitourinary: Negative for frequency, hematuria, flank pain and difficulty urinating.  Musculoskeletal: Negative for arthralgias and back pain.  Skin: Negative for rash.  Neurological: Positive for light-headedness. Negative for dizziness, speech difficulty, weakness, numbness and headaches.      Allergies  Ace inhibitors; Penicillins; Streptomycin; and Tramadol  Home Medications   Prior to Admission medications   Medication Sig Start Date End Date Taking? Authorizing Provider  atenolol  (TENORMIN) 25 MG tablet Take 25 mg by mouth every morning.    Yes Historical Provider, MD  citalopram (CELEXA) 40 MG tablet Take 40 mg by mouth every morning.    Yes Historical Provider, MD  clotrimazole (LOTRIMIN) 1 % cream Apply 1 application topically 2 (two) times daily.   Yes Historical Provider, MD  estrogens, conjugated, (PREMARIN) 0.3 MG tablet Take 0.3 mg by mouth every morning.    Yes Historical Provider, MD  furosemide (LASIX) 40 MG tablet Take 40 mg by mouth every morning.    Yes Historical Provider, MD  isosorbide mononitrate (IMDUR) 30 MG 24 hr tablet Take 1 tablet (30 mg total) by mouth daily. 01/08/13  Yes Jettie Booze, MD  ketoconazole (NIZORAL) 2 % cream Apply 1 application topically 2 (two) times daily as needed for irritation.   Yes Historical Provider, MD  loperamide (AF-LOPERAMIDE HCL) 2 MG tablet Take 2 mg by mouth as needed for diarrhea or loose stools. 1 capsule up to 8 tablets per 24hour 8 times a day   Yes Historical Provider, MD  meclizine (ANTIVERT) 25 MG tablet Take 25 mg by mouth 2 (two) times daily as needed for dizziness or nausea.    Yes Historical Provider, MD  Polyethyl Glycol-Propyl Glycol (SYSTANE) 0.4-0.3 % SOLN Apply 1 drop to eye 3 (three) times daily as needed (dry eyes).   Yes Historical Provider, MD  potassium chloride (KLOR-CON) 10 MEQ CR tablet Take 10 mEq by mouth 3 (three) times daily.    Yes Historical Provider, MD  Prenatal Vit-Fe Fumarate-FA (MULTIVITAMIN-PRENATAL) 27-0.8 MG TABS Take 1 tablet by mouth daily.     Yes Historical Provider, MD  triamcinolone cream (KENALOG) 0.1 % Apply 1 application topically 2 (two) times daily as needed (rash).    Yes Historical Provider, MD   BP 136/63  Pulse 63  Temp(Src) 97.7 F (36.5 C) (Oral)  Resp 16  SpO2 100% Physical Exam  Constitutional: She is oriented to person, place, and time. She appears well-developed and well-nourished.  HENT:  Head: Normocephalic and atraumatic.  Eyes: Pupils are  equal, round, and reactive to light.  Neck: Normal range of motion. Neck supple.  Cardiovascular: Normal rate, regular rhythm and normal heart sounds.   Pulmonary/Chest: Effort normal and breath sounds normal. No respiratory distress. She has no wheezes. She has no rales. She exhibits no tenderness.  Abdominal: Soft. Bowel sounds are normal. There is tenderness (Mild tenderness across the lower abdomen bilaterally). There is no rebound and no guarding.  Genitourinary:  +dark blood in the vault  Musculoskeletal: Normal range of motion. She exhibits no edema.  Lymphadenopathy:    She has no cervical adenopathy.  Neurological: She is alert and oriented to person, place, and time.  Skin: Skin is warm and dry. No rash noted.  Psychiatric: She has a normal mood and affect.    ED Course  Procedures (including critical care  time) Labs Review Results for orders placed during the hospital encounter of 08/26/13  CBC WITH DIFFERENTIAL      Result Value Ref Range   WBC 6.0  4.0 - 10.5 K/uL   RBC 3.84 (*) 3.87 - 5.11 MIL/uL   Hemoglobin 12.1  12.0 - 15.0 g/dL   HCT 35.3 (*) 36.0 - 46.0 %   MCV 91.9  78.0 - 100.0 fL   MCH 31.5  26.0 - 34.0 pg   MCHC 34.3  30.0 - 36.0 g/dL   RDW 13.5  11.5 - 15.5 %   Platelets 166  150 - 400 K/uL   Neutrophils Relative % 58  43 - 77 %   Neutro Abs 3.5  1.7 - 7.7 K/uL   Lymphocytes Relative 32  12 - 46 %   Lymphs Abs 1.9  0.7 - 4.0 K/uL   Monocytes Relative 10  3 - 12 %   Monocytes Absolute 0.6  0.1 - 1.0 K/uL   Eosinophils Relative 0  0 - 5 %   Eosinophils Absolute 0.0  0.0 - 0.7 K/uL   Basophils Relative 0  0 - 1 %   Basophils Absolute 0.0  0.0 - 0.1 K/uL  BASIC METABOLIC PANEL      Result Value Ref Range   Sodium 138  137 - 147 mEq/L   Potassium 3.8  3.7 - 5.3 mEq/L   Chloride 101  96 - 112 mEq/L   CO2 23  19 - 32 mEq/L   Glucose, Bld 97  70 - 99 mg/dL   BUN 15  6 - 23 mg/dL   Creatinine, Ser 1.17 (*) 0.50 - 1.10 mg/dL   Calcium 9.4  8.4 - 10.5  mg/dL   GFR calc non Af Amer 39 (*) >90 mL/min   GFR calc Af Amer 45 (*) >90 mL/min   Anion gap 14  5 - 15   Ct Abdomen Pelvis W Contrast  08/10/2013   CLINICAL DATA:  Bright red blood per rectum, abdominal pain  EXAM: CT ABDOMEN AND PELVIS WITH CONTRAST  TECHNIQUE: Multidetector CT imaging of the abdomen and pelvis was performed using the standard protocol following bolus administration of intravenous contrast.  CONTRAST:  11mL OMNIPAQUE IOHEXOL 300 MG/ML SOLN, 163mL OMNIPAQUE IOHEXOL 300 MG/ML SOLN  COMPARISON:  07/05/2013  FINDINGS: The lung bases are free of acute infiltrate or sizable effusion. A large hiatal hernia is again identified.  The liver, spleen, gallbladder, adrenal glands and pancreas are normal in their CT appearance. Prominence of the common bile duct is again identified and stable. This likely within normal limits given the patient's age.  The kidneys are well visualized and demonstrate a normal enhancement pattern. Small cystic lesions are noted bilaterally. No obstructive changes are seen. No calculi are noted.  Significant colonic diverticulosis is noted. No findings to suggest diverticulitis are seen. No focal mass lesion is seen. The bladder is well distended. No pelvic mass lesion is seen. The uterus has been surgically removed. The appendix is been surgically removed. Significant degenerative changes of the lumbar spine are again noted.  IMPRESSION: Chronic changes as described above similar this scan of the previous month. No acute abnormality is identified.   Electronically Signed   By: Inez Catalina M.D.   On: 08/10/2013 14:02     Imaging Review No results found.   EKG Interpretation None      Date: 08/26/2013  Rate: 60  Rhythm: normal sinus rhythm and premature ventricular contractions (PVC)  QRS Axis: left  Intervals: normal  ST/T Wave abnormalities: nonspecific ST/T changes  Conduction Disutrbances:none  Narrative Interpretation:   Old EKG Reviewed:  unchanged   MDM   Final diagnoses:  Rectal bleeding    Pt with ongoing rectal bleeding after banding of an internal hemorrhoid.  Hgb stable.  Surgery has seen and feels that this is expected bleeding after banding procedure.  They are ok with discharge, pt to f/u with Dr. Marcello Moores in 2 weeks or sooner if symptoms worsen.    Malvin Johns, MD 08/26/13 [phone]

## 2013-08-26 NOTE — Telephone Encounter (Signed)
Pt called stating she started having rectal bleeding starting last night. Pt states large amt of dark blood. Pt states at times it " poors from her". Reviewed with Dr Marcello Moores via phone. Per Dr. Manon Hilding request pt advised to go to ER now to have this evaluated. Pt states she understands and will go to [facility].

## 2013-08-26 NOTE — ED Notes (Signed)
Per pt, states was here for same symptoms last week-had hemorrhoidal surgery-started bleeding again last night

## 2013-09-08 DIAGNOSIS — E860 Dehydration: Secondary | ICD-10-CM | POA: Diagnosis not present

## 2013-09-13 ENCOUNTER — Ambulatory Visit (INDEPENDENT_AMBULATORY_CARE_PROVIDER_SITE_OTHER): Payer: Medicare Other | Admitting: General Surgery

## 2013-09-13 VITALS — BP 120/66 | HR 70 | Temp 97.6°F | Ht 66.0 in | Wt 153.0 lb

## 2013-09-13 DIAGNOSIS — K648 Other hemorrhoids: Secondary | ICD-10-CM | POA: Diagnosis not present

## 2013-09-13 NOTE — Patient Instructions (Signed)
Call the office if develop bleeding that does not stop.      HEMORRHOIDS    Did you know... Hemorrhoids are one of the most common ailments known.  More than half the population will develop hemorrhoids, usually after age 78.  Millions of Americans currently suffer from hemorrhoids.  The average person suffers in silence for a long period before seeking medical care.  Today's treatment methods make some types of hemorrhoid removal much less painful.  What are hemorrhoids? Often described as "varicose veins of the anus and rectum", hemorrhoids are enlarged, bulging blood vessels in and about the anus and lower rectum. There are two types of hemorrhoids: external and internal, which refer to their location.  External (outside) hemorrhoids develop near the anus and are covered by very sensitive skin. These are usually painless. However, if a blood clot (thrombosis) develops in an external hemorrhoid, it becomes a painful, hard lump. The external hemorrhoid may bleed if it ruptures. Internal (inside) hemorrhoids develop within the anus beneath the lining. Painless bleeding and protrusion during bowel movements are the most common symptom. However, an internal hemorrhoid can cause severe pain if it is completely "prolapsed" - protrudes from the anal opening and cannot be pushed back inside.   What causes hemorrhoids? An exact cause is unknown; however, the upright posture of humans alone forces a great deal of pressure on the rectal veins, which sometimes causes them to bulge. Other contributing factors include:  . Aging  . Chronic constipation or diarrhea  . Pregnancy  . Heredity  . Straining during bowel movements  . Faulty bowel function due to overuse of laxatives or enemas . Spending long periods of time (e.g., reading) on the toilet  Whatever the cause, the tissues supporting the vessels stretch. As a result, the vessels dilate; their walls become thin and bleed. If the stretching and  pressure continue, the weakened vessels protrude.  What are the symptoms? If you notice any of the following, you could have hemorrhoids:  . Bleeding during bowel movements  . Protrusion during bowel movements . Itching in the anal area  . Pain  . Sensitive lump(s)  How are hemorrhoids treated? Mild symptoms can be relieved frequently by increasing the amount of fiber (e.g., fruits, vegetables, breads and cereals) and fluids in the diet. Eliminating excessive straining reduces the pressure on hemorrhoids and helps prevent them from protruding. A sitz bath - sitting in plain warm water for about 10 minutes - can also provide some relief . With these measures, the pain and swelling of most symptomatic hemorrhoids will decrease in two to seven days, and the firm lump should recede within four to six weeks. In cases of severe or persistent pain from a thrombosed hemorrhoid, your physician may elect to remove the hemorrhoid containing the clot with a small incision. Performed under local anesthesia as an outpatient, this procedure generally provides relief. Severe hemorrhoids may require special treatment, much of which can be performed on an outpatient basis.  . Ligation - the rubber band treatment - works effectively on internal hemorrhoids that protrude with bowel movements. A small rubber band is placed over the hemorrhoid, cutting off its blood supply. The hemorrhoid and the band fall off in a few days and the wound usually heals in a week or two. This procedure sometimes produces mild discomfort and bleeding and may need to be repeated for a full effect.  There is a more intense version of this procedure that is done in the  OR as outpatient surgery called [place].  It involves identifying blood vessels leading to the hemorrhoids and then tying them off with sutures.  This method is a little more painful than rubber band ligation but less painful than traditional hemorrhoidectomy and usually does not  have to be repeated.  It is best for internal hemorrhoids that bleed.  Rubber Band Ligation of Internal Hemorrhoids:  A.  Bulging, bleeding, internal hemorrhoid B.  Rubber band applied at the base of the hemorrhoid C.  About 7 days later, the banded hemorrhoid has fallen off leaving a small scar (arrow)  . Injection and Coagulation can also be used on bleeding hemorrhoids that do not protrude. Both methods are relatively painless and cause the hemorrhoid to shrivel up. Marland Kitchen Hemorrhoidectomy - surgery to remove the hemorrhoids - is the most complete method for removal of internal and external hemorrhoids. It is necessary when (1) clots repeatedly form in external hemorrhoids; (2) ligation fails to treat internal hemorrhoids; (3) the protruding hemorrhoid cannot be reduced; or (4) there is persistent bleeding. A hemorrhoidectomy removes excessive tissue that causes the bleeding and protrusion. It is done under anesthesia using sutures, and may, depending upon circumstances, require hospitalization and a period of inactivity. Laser hemorrhoidectomies do not offer any advantage over standard operative techniques. They are also quite expensive, and contrary to popular belief, are no less painful.  Do hemorrhoids lead to cancer? No. There is no relationship between hemorrhoids and cancer. However, the symptoms of hemorrhoids, particularly bleeding, are similar to those of colorectal cancer and other diseases of the digestive system. Therefore, it is important that all symptoms are investigated by a physician specially trained in treating diseases of the colon and rectum and that everyone 50 years or older undergo screening tests for colorectal cancer. Do not rely on over-the-counter medications or other self-treatments. See a colorectal surgeon first so your symptoms can be properly evaluated and effective treatment prescribed.  2012 American Society of Colon & Rectal Sur[employer]

## 2013-09-13 NOTE — Progress Notes (Signed)
[name] is a 80 y.o. female who is here for a follow up visit regarding her rectal bleeding. She was seen in the hospital and a band was placed on the internal bleeding hemorrhoids.  She did have some mild bleeding approximately 10 days after surgery. This was evaluated and felt to be within normal limits for this procedure.  Since then she has had no major rectal bleeding. She is having regular bowel movements. She continues to have some loose stools, which are probably contributing to her irritated internal hemorrhoids.    Objective: There were no vitals filed for this visit.  General appearance: alert and cooperative GI: normal findings: soft, non-tender Anal exam deferred per patient request  Assessment and Plan: She seems to be doing well after banding. I have recommended that she be followed for right now. She should continue taking Imodium as needed to prevent loose stools. I will see her back on an as-needed basis. I have recommended that she call the office if she develops new rectal bleeding that does not stop after bowel movement.    Rosario Adie, MD [facility], [place]

## 2013-09-23 DIAGNOSIS — Z961 Presence of intraocular lens: Secondary | ICD-10-CM | POA: Diagnosis not present

## 2013-09-23 DIAGNOSIS — H524 Presbyopia: Secondary | ICD-10-CM | POA: Diagnosis not present

## 2013-09-23 DIAGNOSIS — H04129 Dry eye syndrome of unspecified lacrimal gland: Secondary | ICD-10-CM | POA: Diagnosis not present

## 2013-09-23 DIAGNOSIS — H5200 Hypermetropia, unspecified eye: Secondary | ICD-10-CM | POA: Diagnosis not present

## 2013-09-23 DIAGNOSIS — H43819 Vitreous degeneration, unspecified eye: Secondary | ICD-10-CM | POA: Diagnosis not present

## 2013-09-23 DIAGNOSIS — H52229 Regular astigmatism, unspecified eye: Secondary | ICD-10-CM | POA: Diagnosis not present

## 2013-10-06 DIAGNOSIS — M48061 Spinal stenosis, lumbar region without neurogenic claudication: Secondary | ICD-10-CM | POA: Diagnosis not present

## 2013-10-13 DIAGNOSIS — K625 Hemorrhage of anus and rectum: Secondary | ICD-10-CM | POA: Diagnosis not present

## 2013-10-13 DIAGNOSIS — F329 Major depressive disorder, single episode, unspecified: Secondary | ICD-10-CM | POA: Diagnosis not present

## 2013-10-13 DIAGNOSIS — K648 Other hemorrhoids: Secondary | ICD-10-CM | POA: Diagnosis not present

## 2013-10-13 DIAGNOSIS — E876 Hypokalemia: Secondary | ICD-10-CM | POA: Diagnosis not present

## 2013-10-13 DIAGNOSIS — Z23 Encounter for immunization: Secondary | ICD-10-CM | POA: Diagnosis not present

## 2013-10-13 DIAGNOSIS — I10 Essential (primary) hypertension: Secondary | ICD-10-CM | POA: Diagnosis not present

## 2013-10-13 DIAGNOSIS — R197 Diarrhea, unspecified: Secondary | ICD-10-CM | POA: Diagnosis not present

## 2013-10-13 DIAGNOSIS — F3289 Other specified depressive episodes: Secondary | ICD-10-CM | POA: Diagnosis not present

## 2013-10-13 DIAGNOSIS — H8110 Benign paroxysmal vertigo, unspecified ear: Secondary | ICD-10-CM | POA: Diagnosis not present

## 2013-12-17 ENCOUNTER — Ambulatory Visit (INDEPENDENT_AMBULATORY_CARE_PROVIDER_SITE_OTHER): Payer: Medicare Other | Admitting: Emergency Medicine

## 2013-12-17 VITALS — BP 118/84 | HR 75 | Temp 97.5°F | Resp 16 | Ht 64.5 in | Wt 154.0 lb

## 2013-12-17 DIAGNOSIS — B084 Enteroviral vesicular stomatitis with exanthem: Secondary | ICD-10-CM | POA: Diagnosis not present

## 2013-12-17 NOTE — Progress Notes (Signed)
[facility] [address] [place] [address] [phone]  Date:  12/17/2013   Name:  [name]   DOB:  10/26/1916   MRN:  [id number]  PCP:  Henrine Screws, MD    Chief Complaint: Rash   History of Present Illness:  [name] is a 80 y.o. very pleasant female patient who presents with the following:  3-4 day history of rash that is pruritic.  No fever or chills, no cough or coryza.  No nausea or vomiting Rash is on face, lips, hands and feet, forearms. No ill contacts No improvement with over the counter medications or other home remedies.  Denies other complaint or health concern today.   Patient Active Problem List   Diagnosis Date Noted  . Rectal bleed 08/10/2013  . Acute renal failure 08/10/2013  . Essential hypertension, benign 01/08/2013    Past Medical History  Diagnosis Date  . Hyperglycemia   . CAD (coronary artery disease)   . Pneumonia     july 2009  . Hyperlipidemia   . Hormone replacement therapy (postmenopausal)   . Diverticulosis   . DJD (degenerative joint disease) of lumbar spine     scoliosis  . DJD (degenerative joint disease) of knee   . Sciatica     right lower extremity  . Allergic rhinitis   . Reactive depression (situational)   . Shingles     right thoracic 2008  . Eczema   . GI bleed   . Diverticular disease   . Esophageal erosions     from nsaid   . Cough   . Orthopnea   . Vertigo   . CHF (congestive heart failure)   . Hypertension     Past Surgical History  Procedure Laterality Date  . Back surgery    . Total abdominal hysterectomy    . Breast lumpectomy    . Appendectomy    . Flexible sigmoidoscopy Left 08/13/2013    Procedure: FLEXIBLE SIGMOIDOSCOPY;  Surgeon: Arta Silence, MD;  Location: [facility];  Service: Endoscopy;  Laterality: Left;    History  Substance Use Topics  . Smoking status: Never Smoker   . Smokeless tobacco: Never Used  . Alcohol Use: Yes      Comment: occasional cocktail    Family History  Problem Relation Age of Onset  . Heart disease Daughter   . Heart disease Son   . Heart disease Father   . Heart disease Mother   . Cancer Brother   . Heart disease Sister     Allergies  Allergen Reactions  . Ace Inhibitors Hives and Itching  . Penicillins     Pruritic rash  . Streptomycin     Pruritus   . Tramadol     unknown    Medication list has been reviewed and updated.  Current Outpatient Prescriptions on File Prior to Visit  Medication Sig Dispense Refill  . atenolol (TENORMIN) 25 MG tablet Take 25 mg by mouth every morning.     . citalopram (CELEXA) 40 MG tablet Take 40 mg by mouth every morning.     . clotrimazole (LOTRIMIN) 1 % cream Apply 1 application topically 2 (two) times daily.    Marland Kitchen estrogens, conjugated, (PREMARIN) 0.3 MG tablet Take 0.3 mg by mouth every morning.     . furosemide (LASIX) 40 MG tablet Take 40 mg by mouth every morning.     . isosorbide mononitrate (IMDUR) 30 MG 24 hr tablet Take  1 tablet (30 mg total) by mouth daily. 90 tablet 3  . ketoconazole (NIZORAL) 2 % cream Apply 1 application topically 2 (two) times daily as needed for irritation.    Marland Kitchen loperamide (AF-LOPERAMIDE HCL) 2 MG tablet Take 2 mg by mouth as needed for diarrhea or loose stools. 1 capsule up to 8 tablets per 24hour 8 times a day    . meclizine (ANTIVERT) 25 MG tablet Take 25 mg by mouth 2 (two) times daily as needed for dizziness or nausea.     . potassium chloride (KLOR-CON) 10 MEQ CR tablet Take 10 mEq by mouth 3 (three) times daily.     Marland Kitchen triamcinolone cream (KENALOG) 0.1 % Apply 1 application topically 2 (two) times daily as needed (rash).      No current facility-administered medications on file prior to visit.    Review of Systems:  As per HPI, otherwise negative.    Physical Examination: Filed Vitals:   12/17/13 1245  BP: 118/84  Pulse: 75  Temp: 97.5 F (36.4 C)  Resp: 16   Filed Vitals:   12/17/13 1245   Height: 5' 4.5" (1.638 m)  Weight: 154 lb (69.854 kg)   Body mass index is 26.04 kg/(m^2). Ideal Body Weight: Weight in (lb) to have BMI = 25: 147.6   GEN: WDWN, NAD, Non-toxic, A & O x 3 HEENT: Atraumatic, Normocephalic. Neck supple. No masses, No LAD. Ears and Nose: No external deformity. CV: RRR, No M/G/R. No JVD. No thrill. No extra heart sounds. PULM: CTA B, no wheezes, crackles, rhonchi. No retractions. No resp. distress. No accessory muscle use. ABD: S, NT, ND, +BS. No rebound. No HSM. EXTR: No c/c/e NEURO Normal gait.  PSYCH: Normally interactive. Conversant. Not depressed or anxious appearing.  Calm demeanor.  SKIN"  Erythematous papules on forearms and hands and face.  Red palms and soles.     Assessment and Plan: HFM disease  Signed,  Ellison Carwin, MD

## 2013-12-17 NOTE — Patient Instructions (Signed)
Hand, Foot, and Mouth Disease  Hand, foot, and mouth disease is a common viral illness. It occurs mainly in children younger than 78 years of age, but adolescents and adults may also get it. This disease is different than foot and mouth disease that cattle, sheep, and pigs get. Most people are better in 1 week.  CAUSES   Hand, foot, and mouth disease is usually caused by a group of viruses called enteroviruses. Hand, foot, and mouth disease can spread from person to person (contagious). A person is most contagious during the first week of the illness. It is not transmitted to or from pets or other animals. It is most common in the summer and early fall. Infection is spread from person to person by direct contact with an infected person's:  · Nose discharge.  · Throat discharge.  · Stool.  SYMPTOMS   Open sores (ulcers) occur in the mouth. Symptoms may also include:  · A rash on the hands and feet, and occasionally the buttocks.  · Fever.  · Aches.  · Pain from the mouth ulcers.  · Fussiness.  DIAGNOSIS   Hand, foot, and mouth disease is one of many infections that cause mouth sores. To be certain your child has hand, foot, and mouth disease your caregiver will diagnose your child by physical exam. Additional tests are not usually needed.  TREATMENT   Nearly all patients recover without medical treatment in 7 to 10 days. There are no common complications. Your child should only take over-the-counter or prescription medicines for pain, discomfort, or fever as directed by your caregiver. Your caregiver may recommend the use of an over-the-counter antacid or a combination of an antacid and diphenhydramine to help coat the lesions in the mouth and improve symptoms.   HOME CARE INSTRUCTIONS  · Try combinations of foods to see what your child will tolerate and aim for a balanced diet. Soft foods may be easier to swallow. The mouth sores from hand, foot, and mouth disease typically hurt and are painful when exposed to  salty, spicy, or acidic food or drinks.  · Milk and cold drinks are soothing for some patients. Milk shakes, frozen ice pops, slushies, and sherberts are usually well tolerated.  · Sport drinks are good choices for hydration, and they also provide a few calories. Often, a child with hand, foot, and mouth disease will be able to drink without discomfort.    · For younger children and infants, feeding with a cup, spoon, or syringe may be less painful than drinking through the nipple of a bottle.  · Keep children out of childcare programs, schools, or other group settings during the first few days of the illness or until they are without fever. The sores on the body are not contagious.  SEEK IMMEDIATE MEDICAL CARE IF:  · Your child develops signs of dehydration such as:  ¨ Decreased urination.  ¨ Dry mouth, tongue, or lips.  ¨ Decreased tears or sunken eyes.  ¨ Dry skin.  ¨ Rapid breathing.  ¨ Fussy behavior.  ¨ Poor color or pale skin.  ¨ Fingertips taking longer than 2 seconds to turn pink after a gentle squeeze.  ¨ Rapid weight loss.  · Your child does not have adequate pain relief.  · Your child develops a severe headache, stiff neck, or change in behavior.  · Your child develops ulcers or blisters that occur on the lips or outside of the mouth.  Document Released: 10/13/2002 Document Revised: 04/08/2011 Document Reviewed: 06/28/2010    [employer]® Patient Information ©2015 [employer]. This information is not intended to replace advice given to you by your health care provider. Make sure you discuss any questions you have with your health care provider.

## 2013-12-21 DIAGNOSIS — R21 Rash and other nonspecific skin eruption: Secondary | ICD-10-CM | POA: Diagnosis not present

## 2013-12-30 DIAGNOSIS — B09 Unspecified viral infection characterized by skin and mucous membrane lesions: Secondary | ICD-10-CM | POA: Diagnosis not present

## 2014-01-12 ENCOUNTER — Encounter: Payer: Self-pay | Admitting: Interventional Cardiology

## 2014-01-12 ENCOUNTER — Ambulatory Visit (INDEPENDENT_AMBULATORY_CARE_PROVIDER_SITE_OTHER): Payer: Medicare Other | Admitting: Interventional Cardiology

## 2014-01-12 VITALS — BP 136/74 | HR 69 | Ht 65.0 in | Wt 160.8 lb

## 2014-01-12 DIAGNOSIS — R9439 Abnormal result of other cardiovascular function study: Secondary | ICD-10-CM | POA: Diagnosis not present

## 2014-01-12 DIAGNOSIS — I10 Essential (primary) hypertension: Secondary | ICD-10-CM | POA: Diagnosis not present

## 2014-01-12 NOTE — Patient Instructions (Signed)
Your physician recommends that you continue on your current medications as directed. Please refer to the Current Medication list given to you today.  Your physician wants you to follow-up in: July 2016 with Dr. Irish Lack.  You will receive a reminder letter in the mail two months in advance. If you don't receive a letter, please call our office to schedule the follow-up appointment.

## 2014-01-12 NOTE — Progress Notes (Signed)
Patient ID: [name], female   DOB: [birth year], 80 y.o.   MRN: [id number]    [address], [address] [place], [place]  [address] Phone: [phone] Fax:  [phone]  Date:  01/12/2014   ID:  [name], DOB 12-28-1916, MRN [id number]  PCP:  Henrine Screws, MD      History of Present Illness: [name] is a 80 y.o. female who had an abnormal stress test several years ago. SHe has DOE when she walks fast. Walking is limited by back and foot pain. She has had a rash on her palms and legs.  It is now peeling.  She has seen dermatology.  No chest pain with activity.    She feels tired. She feels that the Lasix 40 mg makes her urinate too much.  She does not walk much. Housework is the main exercise, but this has been limited due to her foot problem. Has some SHOB with showering. Not walking daily.   She is taking Lasix every day now. SHe does not urinate as much.  She tried twice  A day lasix for a few days but her Cr increased and she felt worse.  Labs came back to baseline after decreasing lasix in 7/15.    Wt Readings from Last 3 Encounters:  01/12/14 160 lb 12.8 oz (72.938 kg)  12/17/13 154 lb (69.854 kg)  09/13/13 153 lb (69.4 kg)     Past Medical History  Diagnosis Date  . Hyperglycemia   . CAD (coronary artery disease)   . Pneumonia     july 2009  . Hyperlipidemia   . Hormone replacement therapy (postmenopausal)   . Diverticulosis   . DJD (degenerative joint disease) of lumbar spine     scoliosis  . DJD (degenerative joint disease) of knee   . Sciatica     right lower extremity  . Allergic rhinitis   . Reactive depression (situational)   . Shingles     right thoracic 2008  . Eczema   . GI bleed   . Diverticular disease   . Esophageal erosions     from nsaid   . Cough   . Orthopnea   . Vertigo   . CHF (congestive heart failure)   . Hypertension     Current Outpatient Prescriptions  Medication Sig Dispense Refill  .  ASPIRIN PO Take 81 mg by mouth every other day.     Marland Kitchen atenolol (TENORMIN) 25 MG tablet Take 25 mg by mouth every morning.     . citalopram (CELEXA) 40 MG tablet Take 40 mg by mouth every morning.     . clotrimazole (LOTRIMIN) 1 % cream Apply 1 application topically as needed (foot -- infection/rash).     Marland Kitchen estrogens, conjugated, (PREMARIN) 0.3 MG tablet Take 0.3 mg by mouth every morning.     . furosemide (LASIX) 40 MG tablet Take 40 mg by mouth every morning.     . isosorbide mononitrate (IMDUR) 30 MG 24 hr tablet Take 1 tablet (30 mg total) by mouth daily. 90 tablet 3  . ketoconazole (NIZORAL) 2 % cream Apply 1 application topically 2 (two) times daily as needed for irritation.    Marland Kitchen loperamide (AF-LOPERAMIDE HCL) 2 MG tablet Take 2 mg by mouth as needed for diarrhea or loose stools. 1 capsule up to 8 tablets per 24hour 8 times a day    . meclizine (ANTIVERT) 25 MG tablet Take 25 mg by mouth 2 (two) times  daily as needed for dizziness or nausea.     . potassium chloride (KLOR-CON) 10 MEQ CR tablet Take 10 mEq by mouth 3 (three) times daily.     Marland Kitchen triamcinolone cream (KENALOG) 0.1 % Apply 1 application topically 2 (two) times daily as needed (rash).      No current facility-administered medications for this visit.    Allergies:    Allergies  Allergen Reactions  . Ace Inhibitors Hives and Itching  . Penicillins     Pruritic rash  . Streptomycin     Pruritus   . Tramadol     unknown    Social History:  The patient  reports that she has never smoked. She has never used smokeless tobacco. She reports that she drinks alcohol. She reports that she does not use illicit drugs.   Family History:  The patient's family history includes Cancer in her brother; Heart disease in her daughter, father, mother, sister, and son.   ROS:  Please see the history of present illness.  No nausea, vomiting.  No fevers, chills.  No focal weakness.  No dysuria. Several days of BRBPR  All other systems reviewed  and negative.   PHYSICAL EXAM: VS:  BP 136/74 mmHg  Pulse 69  Ht 5\' 5"  (1.651 m)  Wt 160 lb 12.8 oz (72.938 kg)  BMI 26.76 kg/m2 Well nourished, well developed, in no acute distress HEENT: normal Neck: no JVD, no carotid bruits Cardiac:  normal S1, S2; RRR; 2/6 systolic murmur Lungs:  clear to auscultation bilaterally, no wheezing, rhonchi or rales Abd: soft, nontender, no hepatomegaly Ext: no edema Skin: warm and dry Neuro:   no focal abnormalities noted Psych: normal affect    ECG: NSR, poor R wave progression. NSST.   ASSESSMENT AND PLAN:  Nonspecific abnormal unspecified cardiovascular function study  Continue Isosorbide Mononitrate Tablet Extended Release 24 Hour, 30 MG, 1 tablet, Orally, Once a day Notes: No angina.Mild lateral wall defect on stress test several years ago. No further w/u at this time given lack of sx. 2. Essential hypertension, benign  Continue Metoprolol Succinate Tablet Extended Release 24 Hour, 25 MG, one tablet, Orally, b.i.d. Notes: Improved. Started amlodipine 5 mg daily. Check BP at home 3. Shortness of breath  Stable mild shortness of breath.  Continue lasix 40 mg daily. BNP 233 in 6/15.  Likely multifactorial form deconditioning and age along with diastolic dysfunction. Eats bananas to replace potassium. Hopefully can increase walking.  4. Others  IMAGING: EKG   Harward,Amy 07/06/2012 10:28:45 AM > Zayyan Mullen,JAY 07/06/2012 11:44:45 AM > NSR, PRWP, LAD, NSST  Continue wound care for her right toe. 5. rectal bleeding: prior diverticulosis requiring hospital stay.  No recent episode   Signed, Mina Marble, MD, [facility] 01/12/2014 9:47 AM

## 2014-01-13 DIAGNOSIS — R9439 Abnormal result of other cardiovascular function study: Secondary | ICD-10-CM | POA: Insufficient documentation

## 2014-03-02 DIAGNOSIS — M7662 Achilles tendinitis, left leg: Secondary | ICD-10-CM | POA: Diagnosis not present

## 2014-03-02 DIAGNOSIS — M79676 Pain in unspecified toe(s): Secondary | ICD-10-CM | POA: Diagnosis not present

## 2014-03-02 DIAGNOSIS — L84 Corns and callosities: Secondary | ICD-10-CM | POA: Diagnosis not present

## 2014-03-09 DIAGNOSIS — F329 Major depressive disorder, single episode, unspecified: Secondary | ICD-10-CM | POA: Diagnosis not present

## 2014-03-09 DIAGNOSIS — F419 Anxiety disorder, unspecified: Secondary | ICD-10-CM | POA: Diagnosis not present

## 2014-03-09 DIAGNOSIS — I10 Essential (primary) hypertension: Secondary | ICD-10-CM | POA: Diagnosis not present

## 2014-03-09 DIAGNOSIS — G609 Hereditary and idiopathic neuropathy, unspecified: Secondary | ICD-10-CM | POA: Diagnosis not present

## 2014-03-09 DIAGNOSIS — E780 Pure hypercholesterolemia: Secondary | ICD-10-CM | POA: Diagnosis not present

## 2014-03-09 DIAGNOSIS — K219 Gastro-esophageal reflux disease without esophagitis: Secondary | ICD-10-CM | POA: Diagnosis not present

## 2014-03-09 DIAGNOSIS — Z1389 Encounter for screening for other disorder: Secondary | ICD-10-CM | POA: Diagnosis not present

## 2014-03-09 DIAGNOSIS — I2510 Atherosclerotic heart disease of native coronary artery without angina pectoris: Secondary | ICD-10-CM | POA: Diagnosis not present

## 2014-03-09 DIAGNOSIS — M1990 Unspecified osteoarthritis, unspecified site: Secondary | ICD-10-CM | POA: Diagnosis not present

## 2014-03-09 DIAGNOSIS — M7662 Achilles tendinitis, left leg: Secondary | ICD-10-CM | POA: Diagnosis not present

## 2014-03-09 DIAGNOSIS — Z0001 Encounter for general adult medical examination with abnormal findings: Secondary | ICD-10-CM | POA: Diagnosis not present

## 2014-04-06 DIAGNOSIS — R5383 Other fatigue: Secondary | ICD-10-CM | POA: Diagnosis not present

## 2014-04-06 DIAGNOSIS — J309 Allergic rhinitis, unspecified: Secondary | ICD-10-CM | POA: Diagnosis not present

## 2014-04-06 DIAGNOSIS — I10 Essential (primary) hypertension: Secondary | ICD-10-CM | POA: Diagnosis not present

## 2014-04-18 DIAGNOSIS — K644 Residual hemorrhoidal skin tags: Secondary | ICD-10-CM | POA: Diagnosis not present

## 2014-04-18 DIAGNOSIS — K648 Other hemorrhoids: Secondary | ICD-10-CM | POA: Diagnosis not present

## 2014-05-06 DIAGNOSIS — L84 Corns and callosities: Secondary | ICD-10-CM | POA: Diagnosis not present

## 2014-05-06 DIAGNOSIS — R05 Cough: Secondary | ICD-10-CM | POA: Diagnosis not present

## 2014-05-06 DIAGNOSIS — J309 Allergic rhinitis, unspecified: Secondary | ICD-10-CM | POA: Diagnosis not present

## 2014-05-06 DIAGNOSIS — I10 Essential (primary) hypertension: Secondary | ICD-10-CM | POA: Diagnosis not present

## 2014-05-06 DIAGNOSIS — R5383 Other fatigue: Secondary | ICD-10-CM | POA: Diagnosis not present

## 2014-05-17 DIAGNOSIS — K644 Residual hemorrhoidal skin tags: Secondary | ICD-10-CM | POA: Diagnosis not present

## 2014-05-17 DIAGNOSIS — K648 Other hemorrhoids: Secondary | ICD-10-CM | POA: Diagnosis not present

## 2014-06-08 DIAGNOSIS — E780 Pure hypercholesterolemia: Secondary | ICD-10-CM | POA: Diagnosis not present

## 2014-06-08 DIAGNOSIS — G609 Hereditary and idiopathic neuropathy, unspecified: Secondary | ICD-10-CM | POA: Diagnosis not present

## 2014-06-08 DIAGNOSIS — F329 Major depressive disorder, single episode, unspecified: Secondary | ICD-10-CM | POA: Diagnosis not present

## 2014-06-08 DIAGNOSIS — F419 Anxiety disorder, unspecified: Secondary | ICD-10-CM | POA: Diagnosis not present

## 2014-06-08 DIAGNOSIS — Z0000 Encounter for general adult medical examination without abnormal findings: Secondary | ICD-10-CM | POA: Diagnosis not present

## 2014-06-08 DIAGNOSIS — M179 Osteoarthritis of knee, unspecified: Secondary | ICD-10-CM | POA: Diagnosis not present

## 2014-06-08 DIAGNOSIS — Z1389 Encounter for screening for other disorder: Secondary | ICD-10-CM | POA: Diagnosis not present

## 2014-06-08 DIAGNOSIS — Z79899 Other long term (current) drug therapy: Secondary | ICD-10-CM | POA: Diagnosis not present

## 2014-06-08 DIAGNOSIS — I10 Essential (primary) hypertension: Secondary | ICD-10-CM | POA: Diagnosis not present

## 2014-06-08 DIAGNOSIS — I25111 Atherosclerotic heart disease of native coronary artery with angina pectoris with documented spasm: Secondary | ICD-10-CM | POA: Diagnosis not present

## 2014-06-08 DIAGNOSIS — J309 Allergic rhinitis, unspecified: Secondary | ICD-10-CM | POA: Diagnosis not present

## 2014-06-08 DIAGNOSIS — Z0001 Encounter for general adult medical examination with abnormal findings: Secondary | ICD-10-CM | POA: Diagnosis not present

## 2014-06-13 DIAGNOSIS — K648 Other hemorrhoids: Secondary | ICD-10-CM | POA: Diagnosis not present

## 2014-06-21 DIAGNOSIS — N390 Urinary tract infection, site not specified: Secondary | ICD-10-CM | POA: Diagnosis not present

## 2014-07-11 ENCOUNTER — Ambulatory Visit (INDEPENDENT_AMBULATORY_CARE_PROVIDER_SITE_OTHER): Payer: Medicare Other | Admitting: Interventional Cardiology

## 2014-07-11 ENCOUNTER — Encounter: Payer: Self-pay | Admitting: Interventional Cardiology

## 2014-07-11 VITALS — BP 112/74 | HR 81 | Ht 65.0 in | Wt 157.0 lb

## 2014-07-11 DIAGNOSIS — R600 Localized edema: Secondary | ICD-10-CM | POA: Diagnosis not present

## 2014-07-11 DIAGNOSIS — I10 Essential (primary) hypertension: Secondary | ICD-10-CM

## 2014-07-11 DIAGNOSIS — R9439 Abnormal result of other cardiovascular function study: Secondary | ICD-10-CM

## 2014-07-11 DIAGNOSIS — R0609 Other forms of dyspnea: Secondary | ICD-10-CM

## 2014-07-11 NOTE — Progress Notes (Signed)
Patient ID: [name], female   DOB: Nov 03, 1916, 81 y.o.   MRN: [id number]     Cardiology Office Note   Date:  07/11/2014   ID:  [name], DOB May 05, 1916, MRN [id number]  PCP:  Henrine Screws, MD    No chief complaint on file.  SHOB  Wt Readings from Last 3 Encounters:  07/11/14 157 lb (71.215 kg)  01/12/14 160 lb 12.8 oz (72.938 kg)  12/17/13 154 lb (69.854 kg)       History of Present Illness: [name] is a 81 y.o. female  who had an abnormal stress test several years ago. SHe has DOE when she walks fast. Walking is limited by back and foot pain.  No chest pain with activity.   She feels tired. She felt in the past that the Lasix 40 mg makes her urinate too much. She does not walk much. Housework is the main exercise, but this has been limited due to her foot problem.  Not walking daily.  She is taking Lasix every day now. SHe does not urinate as much.  She tried twice A day lasix for a few  To help SHOB, but her Cr increased and she felt worse. Labs came back to baseline after decreasing lasix in 7/15.  Overall, she has been feeling at baseline. No major events recently.          Past Medical History  Diagnosis Date  . Hyperglycemia   . CAD (coronary artery disease)   . Pneumonia     july 2009  . Hyperlipidemia   . Hormone replacement therapy (postmenopausal)   . Diverticulosis   . DJD (degenerative joint disease) of lumbar spine     scoliosis  . DJD (degenerative joint disease) of knee   . Sciatica     right lower extremity  . Allergic rhinitis   . Reactive depression (situational)   . Shingles     right thoracic 2008  . Eczema   . GI bleed   . Diverticular disease   . Esophageal erosions     from nsaid   . Cough   . Orthopnea   . Vertigo   . CHF (congestive heart failure)   . Hypertension     Past Surgical History  Procedure Laterality Date  . Back surgery    . Total abdominal hysterectomy    . Breast  lumpectomy    . Appendectomy    . Flexible sigmoidoscopy Left 08/13/2013    Procedure: FLEXIBLE SIGMOIDOSCOPY;  Surgeon: Arta Silence, MD;  Location: [facility];  Service: Endoscopy;  Laterality: Left;     Current Outpatient Prescriptions  Medication Sig Dispense Refill  . ASPIRIN PO Take 81 mg by mouth every other day.     Marland Kitchen atenolol (TENORMIN) 25 MG tablet Take 25 mg by mouth every morning.     . citalopram (CELEXA) 40 MG tablet Take 40 mg by mouth every morning.     . clotrimazole (LOTRIMIN) 1 % cream Apply 1 application topically as needed (foot -- infection/rash).     Marland Kitchen estrogens, conjugated, (PREMARIN) 0.3 MG tablet Take 0.3 mg by mouth every morning.     . furosemide (LASIX) 40 MG tablet Take 40 mg by mouth every morning.     . isosorbide mononitrate (IMDUR) 30 MG 24 hr tablet Take 1 tablet (30 mg total) by mouth daily. 90 tablet 3  . ketoconazole (NIZORAL) 2 % cream Apply 1 application topically 2 (two) times daily as  needed for irritation.    Marland Kitchen loperamide (AF-LOPERAMIDE HCL) 2 MG tablet Take 2 mg by mouth as needed for diarrhea or loose stools. 1 capsule up to 8 tablets per 24hour 8 times a day    . meclizine (ANTIVERT) 25 MG tablet Take 25 mg by mouth 2 (two) times daily as needed for dizziness or nausea.     . potassium chloride (KLOR-CON) 10 MEQ CR tablet Take 10 mEq by mouth 3 (three) times daily.     Marland Kitchen triamcinolone cream (KENALOG) 0.1 % Apply 1 application topically 2 (two) times daily as needed (rash).      No current facility-administered medications for this visit.    Allergies:   Ace inhibitors; Penicillins; Streptomycin; and Tramadol    Social History:  The patient  reports that she has never smoked. She has never used smokeless tobacco. She reports that she drinks alcohol. She reports that she does not use illicit drugs.   Family History:  The patient's *family history includes Cancer in her brother; Heart disease in her daughter, father, mother, sister, and son.     ROS:  Please see the history of present illness.   Otherwise, review of systems are positive for occasional fatigue and swelling, SHOB.   All other systems are reviewed and negative.    PHYSICAL EXAM: VS:  BP 112/74 mmHg  Pulse 81  Ht 5\' 5"  (1.651 m)  Wt 157 lb (71.215 kg)  BMI 26.13 kg/m2 , BMI Body mass index is 26.13 kg/(m^2). GEN: Well nourished, well developed, in no acute distress HEENT: normal Neck: no JVD, carotid bruits, or masses Cardiac: RRR; no murmurs, rubs, or gallops,no edema  Respiratory:  clear to auscultation bilaterally, normal work of breathing GI: soft, nontender, nondistended, + BS MS: no deformity or atrophy Skin: warm and dry, no rash Neuro:  Strength and sensation are intact Psych: euthymic mood, full affect   EKG:   The ekg ordered today demonstrates NSR, PACs PVC   Recent Labs: 07/23/2013: Pro B Natriuretic peptide (BNP) 233.0* 08/10/2013: Magnesium 1.6; TSH 3.040 08/11/2013: ALT 9 08/26/2013: BUN 15; Creatinine, Ser 1.17*; Hemoglobin 12.1; Platelets 166; Potassium 3.8; Sodium 138   Lipid Panel No results found for: CHOL, TRIG, HDL, CHOLHDL, VLDL, LDLCALC, LDLDIRECT   Other studies Reviewed: Additional studies/ records that were reviewed today with results demonstrating: .   ASSESSMENT AND PLAN:  Nonspecific abnormal unspecified cardiovascular function study  Continue Isosorbide Mononitrate Tablet Extended Release 24 Hour, 30 MG, 1 tablet, Orally, Once a day Notes: No angina.Mild lateral wall defect on stress test several years ago. No further w/u at this time given lack of sx.  reviewed echocardiogram from 2010. This showed normal left ventricular function. No evidence of aortic stenosis.  2. Essential hypertension, benign  Continue Metoprolol Succinate Tablet Extended Release 24 Hour, 25 MG, one tablet, Orally, b.i.d. Notes: Improved. No longer taking amlodipine 5 mg daily. Check BP at home 3. Shortness of breath  Stable mild shortness of  breath. Continue lasix 40 mg daily. BNP 233 in 6/15.  Likely multifactorial form deconditioning and age along with diastolic dysfunction. Eats bananas to replace potassium. Hopefully can increase walking.   Elevate legs when possible for swelling.  4.      rectal bleeding: prior diverticulosis requiring hospital stay. No recent episode          Current medicines are reviewed at length with the patient today.  The patient concerns regarding her medicines were addressed.  The following changes  have been made:  No change  Labs/ tests ordered today include: none  Orders Placed This Encounter  Procedures  . EKG 12-Lead    Recommend 150 minutes/week of aerobic exercise Low fat, low carb, high fiber diet recommended  Disposition:   FU in 1 year   Teresita Madura., MD  07/11/2014 10:41 AM    [facility] [address], [place], [place]  [address] Phone: [phone]; Fax: [phone]

## 2014-07-11 NOTE — Patient Instructions (Signed)
Medication Instructions:   Your physician recommends that you continue on your current medications as directed. Please refer to the Current Medication list given to you today.   Labwork:   Testing/Procedures:   Follow-Up:  Your physician wants you to follow-up in:  [place] will receive a reminder letter in the mail two months in advance. If you don't receive a letter, please call our office to schedule the follow-up appointment.   Any Other Special Instructions Will Be Listed Below (If Applicable).

## 2014-07-27 DIAGNOSIS — Z6828 Body mass index (BMI) 28.0-28.9, adult: Secondary | ICD-10-CM | POA: Diagnosis not present

## 2014-07-27 DIAGNOSIS — M4806 Spinal stenosis, lumbar region: Secondary | ICD-10-CM | POA: Diagnosis not present

## 2014-09-12 ENCOUNTER — Inpatient Hospital Stay (HOSPITAL_COMMUNITY)
Admission: EM | Admit: 2014-09-12 | Discharge: 2014-09-14 | DRG: 392 | Disposition: A | Discharge status: Home / Self Care | Payer: Medicare Other | Attending: Internal Medicine | Admitting: Internal Medicine

## 2014-09-12 ENCOUNTER — Encounter (HOSPITAL_COMMUNITY): Payer: Self-pay | Admitting: Emergency Medicine

## 2014-09-12 ENCOUNTER — Emergency Department (HOSPITAL_COMMUNITY): Payer: Medicare Other

## 2014-09-12 ENCOUNTER — Inpatient Hospital Stay (HOSPITAL_COMMUNITY): Payer: Medicare Other

## 2014-09-12 DIAGNOSIS — D696 Thrombocytopenia, unspecified: Secondary | ICD-10-CM | POA: Diagnosis not present

## 2014-09-12 DIAGNOSIS — R531 Weakness: Secondary | ICD-10-CM | POA: Diagnosis not present

## 2014-09-12 DIAGNOSIS — Z66 Do not resuscitate: Secondary | ICD-10-CM | POA: Diagnosis present

## 2014-09-12 DIAGNOSIS — N183 Chronic kidney disease, stage 3 unspecified: Secondary | ICD-10-CM | POA: Diagnosis present

## 2014-09-12 DIAGNOSIS — Z7982 Long term (current) use of aspirin: Secondary | ICD-10-CM

## 2014-09-12 DIAGNOSIS — K760 Fatty (change of) liver, not elsewhere classified: Secondary | ICD-10-CM | POA: Diagnosis present

## 2014-09-12 DIAGNOSIS — K5730 Diverticulosis of large intestine without perforation or abscess without bleeding: Secondary | ICD-10-CM | POA: Diagnosis not present

## 2014-09-12 DIAGNOSIS — Z79890 Hormone replacement therapy (postmenopausal): Secondary | ICD-10-CM | POA: Diagnosis not present

## 2014-09-12 DIAGNOSIS — R0602 Shortness of breath: Secondary | ICD-10-CM | POA: Diagnosis not present

## 2014-09-12 DIAGNOSIS — R509 Fever, unspecified: Secondary | ICD-10-CM | POA: Diagnosis not present

## 2014-09-12 DIAGNOSIS — K5792 Diverticulitis of intestine, part unspecified, without perforation or abscess without bleeding: Secondary | ICD-10-CM | POA: Diagnosis present

## 2014-09-12 DIAGNOSIS — K449 Diaphragmatic hernia without obstruction or gangrene: Secondary | ICD-10-CM | POA: Diagnosis not present

## 2014-09-12 DIAGNOSIS — I7090 Unspecified atherosclerosis: Secondary | ICD-10-CM | POA: Diagnosis present

## 2014-09-12 DIAGNOSIS — R1030 Lower abdominal pain, unspecified: Secondary | ICD-10-CM

## 2014-09-12 DIAGNOSIS — Z90710 Acquired absence of both cervix and uterus: Secondary | ICD-10-CM

## 2014-09-12 DIAGNOSIS — I129 Hypertensive chronic kidney disease with stage 1 through stage 4 chronic kidney disease, or unspecified chronic kidney disease: Secondary | ICD-10-CM | POA: Diagnosis present

## 2014-09-12 DIAGNOSIS — N390 Urinary tract infection, site not specified: Secondary | ICD-10-CM | POA: Diagnosis not present

## 2014-09-12 DIAGNOSIS — N3000 Acute cystitis without hematuria: Secondary | ICD-10-CM | POA: Diagnosis not present

## 2014-09-12 DIAGNOSIS — E785 Hyperlipidemia, unspecified: Secondary | ICD-10-CM | POA: Diagnosis present

## 2014-09-12 DIAGNOSIS — I159 Secondary hypertension, unspecified: Secondary | ICD-10-CM | POA: Diagnosis not present

## 2014-09-12 DIAGNOSIS — K5790 Diverticulosis of intestine, part unspecified, without perforation or abscess without bleeding: Secondary | ICD-10-CM | POA: Diagnosis present

## 2014-09-12 DIAGNOSIS — R1032 Left lower quadrant pain: Secondary | ICD-10-CM | POA: Diagnosis not present

## 2014-09-12 DIAGNOSIS — I10 Essential (primary) hypertension: Secondary | ICD-10-CM | POA: Diagnosis present

## 2014-09-12 DIAGNOSIS — R404 Transient alteration of awareness: Secondary | ICD-10-CM | POA: Diagnosis not present

## 2014-09-12 DIAGNOSIS — K6389 Other specified diseases of intestine: Secondary | ICD-10-CM | POA: Diagnosis not present

## 2014-09-12 HISTORY — DX: Chronic kidney disease, stage 3 unspecified: N18.30

## 2014-09-12 LAB — COMPREHENSIVE METABOLIC PANEL
ALBUMIN: 3.5 g/dL (ref 3.5–5.0)
ALT: 11 U/L — ABNORMAL LOW (ref 14–54)
AST: 24 U/L (ref 15–41)
Alkaline Phosphatase: 56 U/L (ref 38–126)
Anion gap: 10 (ref 5–15)
BILIRUBIN TOTAL: 0.7 mg/dL (ref 0.3–1.2)
BUN: 22 mg/dL — AB (ref 6–20)
CHLORIDE: 105 mmol/L (ref 101–111)
CO2: 23 mmol/L (ref 22–32)
Calcium: 9.2 mg/dL (ref 8.9–10.3)
Creatinine, Ser: 1.1 mg/dL — ABNORMAL HIGH (ref 0.44–1.00)
GFR calc Af Amer: 48 mL/min — ABNORMAL LOW (ref >60)
GFR calc non Af Amer: 41 mL/min — ABNORMAL LOW (ref >60)
GLUCOSE: 108 mg/dL — AB (ref 65–99)
POTASSIUM: 3.5 mmol/L (ref 3.5–5.1)
SODIUM: 138 mmol/L (ref 135–145)
Total Protein: 6.6 g/dL (ref 6.5–8.1)

## 2014-09-12 LAB — CBC WITH DIFFERENTIAL/PLATELET
Basophils Absolute: 0 10*3/uL (ref 0.0–0.1)
Basophils Relative: 0 % (ref 0–1)
EOS PCT: 0 % (ref 0–5)
Eosinophils Absolute: 0 10*3/uL (ref 0.0–0.7)
HCT: 38.2 % (ref 36.0–46.0)
Hemoglobin: 12.6 g/dL (ref 12.0–15.0)
LYMPHS ABS: 0.4 10*3/uL — AB (ref 0.7–4.0)
Lymphocytes Relative: 4 % — ABNORMAL LOW (ref 12–46)
MCH: 30.7 pg (ref 26.0–34.0)
MCHC: 33 g/dL (ref 30.0–36.0)
MCV: 92.9 fL (ref 78.0–100.0)
MONO ABS: 0.3 10*3/uL (ref 0.1–1.0)
MONOS PCT: 3 % (ref 3–12)
NEUTROS ABS: 11 10*3/uL — AB (ref 1.7–7.7)
Neutrophils Relative %: 93 % — ABNORMAL HIGH (ref 43–77)
PLATELETS: 121 10*3/uL — AB (ref 150–400)
RBC: 4.11 MIL/uL (ref 3.87–5.11)
RDW: 13.9 % (ref 11.5–15.5)
WBC: 11.8 10*3/uL — ABNORMAL HIGH (ref 4.0–10.5)

## 2014-09-12 LAB — URINALYSIS, ROUTINE W REFLEX MICROSCOPIC
Bilirubin Urine: NEGATIVE
GLUCOSE, UA: NEGATIVE mg/dL
KETONES UR: NEGATIVE mg/dL
Nitrite: NEGATIVE
PROTEIN: NEGATIVE mg/dL
Specific Gravity, Urine: 1.015 (ref 1.005–1.030)
Urobilinogen, UA: 0.2 mg/dL (ref 0.0–1.0)
pH: 5.5 (ref 5.0–8.0)

## 2014-09-12 LAB — I-STAT CG4 LACTIC ACID, ED
LACTIC ACID, VENOUS: 1.74 mmol/L (ref 0.5–2.0)
Lactic Acid, Venous: 2.26 mmol/L (ref 0.5–2.0)

## 2014-09-12 LAB — URINE MICROSCOPIC-ADD ON

## 2014-09-12 MED ORDER — CIPROFLOXACIN IN D5W 400 MG/200ML IV SOLN
400.0000 mg | Freq: Two times a day (BID) | INTRAVENOUS | Status: DC
  Administered 2014-09-12 – 2014-09-13 (×3): 400 mg via INTRAVENOUS
  Filled 2014-09-12 (×3): qty 200

## 2014-09-12 MED ORDER — IOHEXOL 300 MG/ML  SOLN
50.0000 mL | Freq: Once | INTRAMUSCULAR | Status: AC | PRN
  Administered 2014-09-12: 50 mL via ORAL

## 2014-09-12 MED ORDER — ATENOLOL 25 MG PO TABS
25.0000 mg | ORAL_TABLET | Freq: Every day | ORAL | Status: DC
  Administered 2014-09-13 – 2014-09-14 (×2): 25 mg via ORAL
  Filled 2014-09-12 (×3): qty 1

## 2014-09-12 MED ORDER — SODIUM CHLORIDE 0.9 % IV SOLN
INTRAVENOUS | Status: AC
  Administered 2014-09-12: 07:00:00 via INTRAVENOUS

## 2014-09-12 MED ORDER — ISOSORBIDE MONONITRATE ER 30 MG PO TB24
30.0000 mg | ORAL_TABLET | Freq: Every day | ORAL | Status: DC
  Administered 2014-09-12 – 2014-09-14 (×3): 30 mg via ORAL
  Filled 2014-09-12 (×3): qty 1

## 2014-09-12 MED ORDER — ESTROGENS CONJUGATED 0.3 MG PO TABS
0.3000 mg | ORAL_TABLET | Freq: Every day | ORAL | Status: DC
  Administered 2014-09-12 – 2014-09-14 (×3): 0.3 mg via ORAL
  Filled 2014-09-12 (×3): qty 1

## 2014-09-12 MED ORDER — SODIUM CHLORIDE 0.9 % IV BOLUS (SEPSIS)
500.0000 mL | Freq: Once | INTRAVENOUS | Status: AC
  Administered 2014-09-12: 500 mL via INTRAVENOUS

## 2014-09-12 MED ORDER — ACETAMINOPHEN 325 MG PO TABS
650.0000 mg | ORAL_TABLET | Freq: Once | ORAL | Status: AC | PRN
  Administered 2014-09-12: 650 mg via ORAL
  Filled 2014-09-12: qty 2

## 2014-09-12 MED ORDER — MECLIZINE HCL 25 MG PO TABS
25.0000 mg | ORAL_TABLET | Freq: Two times a day (BID) | ORAL | Status: DC | PRN
  Filled 2014-09-12: qty 1

## 2014-09-12 MED ORDER — ACETAMINOPHEN 325 MG PO TABS
650.0000 mg | ORAL_TABLET | Freq: Four times a day (QID) | ORAL | Status: DC | PRN
  Administered 2014-09-12: 650 mg via ORAL
  Filled 2014-09-12: qty 2

## 2014-09-12 MED ORDER — CITALOPRAM HYDROBROMIDE 40 MG PO TABS
40.0000 mg | ORAL_TABLET | Freq: Every day | ORAL | Status: DC
  Administered 2014-09-12 – 2014-09-13 (×2): 40 mg via ORAL
  Filled 2014-09-12 (×2): qty 1

## 2014-09-12 MED ORDER — SODIUM CHLORIDE 0.9 % IV SOLN
INTRAVENOUS | Status: DC
  Administered 2014-09-12 – 2014-09-13 (×2): via INTRAVENOUS

## 2014-09-12 MED ORDER — METRONIDAZOLE IN NACL 5-0.79 MG/ML-% IV SOLN
500.0000 mg | Freq: Three times a day (TID) | INTRAVENOUS | Status: DC
  Administered 2014-09-12 – 2014-09-13 (×4): 500 mg via INTRAVENOUS
  Filled 2014-09-12 (×5): qty 100

## 2014-09-12 NOTE — ED Provider Notes (Signed)
Patient lives at home alone. She states yesterday she had chills that lasted throughout the night and then she started getting leg achiness and weakness. She states she was trying to walk to the bathroom and almost fell. She reports she's been having nocturia the past couple days having about 4 episodes a night which is unusual. She denies any respiratory symptoms. She has had some mild nausea and vomiting and has had some mild loose stools. She has a history of diverticulitis.  Pleasant elderly female who is awake alert and cooperative. She has some tenderness in her lower  abdomen with no guarding or rebound.  Medical screening examination/treatment/procedure(s) were conducted as a shared visit with non-physician practitioner(s) and myself.  I personally evaluated the patient during the encounter.   EKG Interpretation None       Rolland Porter, MD, Barbette Or, MD 09/12/14 [phone]

## 2014-09-12 NOTE — ED Provider Notes (Signed)
CSN: [id number]     Arrival date & time 09/12/14  0435 History   First MD Initiated Contact with Patient 09/12/14 0450     Chief Complaint  Patient presents with  . Fever  . Generalized Body Aches     (Consider location/radiation/quality/duration/timing/severity/associated sxs/prior Treatment) HPI Comments: Patient with a history of HTN, diverticular disease and GI bleed, CHF, CAD presents with sudden onset last night of generalized body aches and chills. She denies chest pain or headache. She has lower abdominal discomfort, nausea and vomiting without diarrhea. She denies dysuria. No known fever but she has chills. No cough. Symptoms started around 10:00 pm last night after a normal day. Per family, there has been no confusion or disorientation.  Patient is a 81 y.o. female presenting with fever. The history is provided by the patient and a relative. No language interpreter was used.  Fever Duration:  7 hours Progression:  Worsening Associated symptoms: chills, myalgias, nausea and vomiting   Associated symptoms: no chest pain, no confusion, no congestion, no cough, no diarrhea, no dysuria, no headaches and no sore throat     Past Medical History  Diagnosis Date  . Hyperglycemia   . CAD (coronary artery disease)   . Pneumonia     july 2009  . Hyperlipidemia   . Hormone replacement therapy (postmenopausal)   . Diverticulosis   . DJD (degenerative joint disease) of lumbar spine     scoliosis  . DJD (degenerative joint disease) of knee   . Sciatica     right lower extremity  . Allergic rhinitis   . Reactive depression (situational)   . Shingles     right thoracic 2008  . Eczema   . GI bleed   . Diverticular disease   . Esophageal erosions     from nsaid   . Cough   . Orthopnea   . Vertigo   . CHF (congestive heart failure)   . Hypertension    Past Surgical History  Procedure Laterality Date  . Back surgery    . Total abdominal hysterectomy    . Breast lumpectomy     . Appendectomy    . Flexible sigmoidoscopy Left 08/13/2013    Procedure: FLEXIBLE SIGMOIDOSCOPY;  Surgeon: Arta Silence, MD;  Location: [facility];  Service: Endoscopy;  Laterality: Left;   Family History  Problem Relation Age of Onset  . Heart disease Daughter   . Heart disease Son   . Heart disease Father   . Heart disease Mother   . Cancer Brother   . Heart disease Sister    Social History  Substance Use Topics  . Smoking status: Never Smoker   . Smokeless tobacco: Never Used  . Alcohol Use: Yes     Comment: occasional cocktail   OB History    No data available     Review of Systems  Constitutional: Positive for chills. Negative for fever.  HENT: Negative for congestion and sore throat.   Eyes: Negative for pain and visual disturbance.  Respiratory: Positive for shortness of breath. Negative for cough.   Cardiovascular: Negative for chest pain.  Gastrointestinal: Positive for nausea, vomiting and abdominal pain. Negative for diarrhea.  Genitourinary: Negative for dysuria.  Musculoskeletal: Positive for myalgias. Negative for neck stiffness.  Skin: Negative for wound.  Neurological: Negative for headaches.  Psychiatric/Behavioral: Negative for confusion.      Allergies  Ace inhibitors; Penicillins; Streptomycin; and Tramadol  Home Medications   Prior to Admission medications   Medication  Sig Start Date End Date Taking? Authorizing Provider  ASPIRIN PO Take 81 mg by mouth every other day.     Historical Provider, MD  atenolol (TENORMIN) 25 MG tablet Take 25 mg by mouth every morning.     Historical Provider, MD  citalopram (CELEXA) 40 MG tablet Take 40 mg by mouth every morning.     Historical Provider, MD  clotrimazole (LOTRIMIN) 1 % cream Apply 1 application topically as needed (foot -- infection/rash).     Historical Provider, MD  estrogens, conjugated, (PREMARIN) 0.3 MG tablet Take 0.3 mg by mouth every morning.     Historical Provider, MD  furosemide  (LASIX) 40 MG tablet Take 40 mg by mouth every morning.     Historical Provider, MD  isosorbide mononitrate (IMDUR) 30 MG 24 hr tablet Take 1 tablet (30 mg total) by mouth daily. 01/08/13   Jettie Booze, MD  ketoconazole (NIZORAL) 2 % cream Apply 1 application topically 2 (two) times daily as needed for irritation.    Historical Provider, MD  loperamide (AF-LOPERAMIDE HCL) 2 MG tablet Take 2 mg by mouth as needed for diarrhea or loose stools. 1 capsule up to 8 tablets per 24hour 8 times a day    Historical Provider, MD  meclizine (ANTIVERT) 25 MG tablet Take 25 mg by mouth 2 (two) times daily as needed for dizziness or nausea.     Historical Provider, MD  potassium chloride (KLOR-CON) 10 MEQ CR tablet Take 10 mEq by mouth 3 (three) times daily.     Historical Provider, MD  triamcinolone cream (KENALOG) 0.1 % Apply 1 application topically 2 (two) times daily as needed (rash).     Historical Provider, MD   BP 113/67 mmHg  Pulse 99  Temp(Src) 101.9 F (38.8 C) (Rectal)  Resp 18  Ht 5\' 6"  (1.676 m)  Wt 150 lb (68.04 kg)  BMI 24.22 kg/m2  SpO2 94% Physical Exam  Constitutional: She is oriented to person, place, and time. She appears well-developed and well-nourished.  HENT:  Head: Normocephalic.  Neck: Normal range of motion. Neck supple.  Cardiovascular: Normal rate and regular rhythm.   Pulmonary/Chest: Effort normal and breath sounds normal.  Abdominal: Soft. Bowel sounds are normal. There is tenderness. There is no rebound and no guarding.  Tender to right and left lower quadrants. No guarding. Abdomen soft.   Musculoskeletal: Normal range of motion.  Neurological: She is alert and oriented to person, place, and time. Coordination normal.  Skin: Skin is warm and dry. No rash noted.  Psychiatric: She has a normal mood and affect.    ED Course  Procedures (including critical care time) Labs Review Labs Reviewed  CULTURE, BLOOD (ROUTINE X 2)  CULTURE, BLOOD (ROUTINE X 2)   URINE CULTURE  COMPREHENSIVE METABOLIC PANEL  CBC WITH DIFFERENTIAL/PLATELET  URINALYSIS, ROUTINE W REFLEX MICROSCOPIC (NOT AT [facility])  I-STAT CG4 LACTIC ACID, ED   Results for orders placed or performed during the hospital encounter of 09/12/14  Culture, blood (routine x 2)  Result Value Ref Range   Specimen Description BLOOD LEFT ANTECUBITAL    Special Requests BOTTLES DRAWN AEROBIC AND ANAEROBIC 5ML    Culture      NO GROWTH 3 DAYS Performed at [facility]    Report Status PENDING   Culture, blood (routine x 2)  Result Value Ref Range   Specimen Description BLOOD RIGHT HAND    Special Requests BOTTLES DRAWN AEROBIC AND ANAEROBIC 5.5ML    Culture  Setup Time      GRAM NEGATIVE RODS ANAEROBIC BOTTLE ONLY CRITICAL RESULT CALLED TO, READ BACK BY AND VERIFIED WITH: E CAUDLE RN 09/14/14 @ [place] Performed at [facility]    Report Status PENDING   Urine culture  Result Value Ref Range   Specimen Description URINE, CLEAN CATCH    Special Requests NONE    Culture      MULTIPLE SPECIES PRESENT, SUGGEST RECOLLECTION Performed at [facility]    Report Status 09/13/2014 FINAL   Comprehensive metabolic panel  Result Value Ref Range   Sodium 138 135 - 145 mmol/L   Potassium 3.5 3.5 - 5.1 mmol/L   Chloride 105 101 - 111 mmol/L   CO2 23 22 - 32 mmol/L   Glucose, Bld 108 (H) 65 - 99 mg/dL   BUN 22 (H) 6 - 20 mg/dL   Creatinine, Ser 1.10 (H) 0.44 - 1.00 mg/dL   Calcium 9.2 8.9 - 10.3 mg/dL   Total Protein 6.6 6.5 - 8.1 g/dL   Albumin 3.5 3.5 - 5.0 g/dL   AST 24 15 - 41 U/L   ALT 11 (L) 14 - 54 U/L   Alkaline Phosphatase 56 38 - 126 U/L   Total Bilirubin 0.7 0.3 - 1.2 mg/dL   GFR calc non Af Amer 41 (L) >60 mL/min   GFR calc Af Amer 48 (L) >60 mL/min   Anion gap 10 5 - 15  CBC with Differential  Result Value Ref Range   WBC 11.8 (H) 4.0 - 10.5 K/uL   RBC 4.11 3.87 - 5.11 MIL/uL   Hemoglobin 12.6 12.0 - 15.0  g/dL   HCT 38.2 36.0 - 46.0 %   MCV 92.9 78.0 - 100.0 fL   MCH 30.7 26.0 - 34.0 pg   MCHC 33.0 30.0 - 36.0 g/dL   RDW 13.9 11.5 - 15.5 %   Platelets 121 (L) 150 - 400 K/uL   Neutrophils Relative % 93 (H) 43 - 77 %   Neutro Abs 11.0 (H) 1.7 - 7.7 K/uL   Lymphocytes Relative 4 (L) 12 - 46 %   Lymphs Abs 0.4 (L) 0.7 - 4.0 K/uL   Monocytes Relative 3 3 - 12 %   Monocytes Absolute 0.3 0.1 - 1.0 K/uL   Eosinophils Relative 0 0 - 5 %   Eosinophils Absolute 0.0 0.0 - 0.7 K/uL   Basophils Relative 0 0 - 1 %   Basophils Absolute 0.0 0.0 - 0.1 K/uL  Urinalysis, Routine w reflex microscopic (not at [facility])  Result Value Ref Range   Color, Urine YELLOW YELLOW   APPearance CLOUDY (A) CLEAR   Specific Gravity, Urine 1.015 1.005 - 1.030   pH 5.5 5.0 - 8.0   Glucose, UA NEGATIVE NEGATIVE mg/dL   Hgb urine dipstick SMALL (A) NEGATIVE   Bilirubin Urine NEGATIVE NEGATIVE   Ketones, ur NEGATIVE NEGATIVE mg/dL   Protein, ur NEGATIVE NEGATIVE mg/dL   Urobilinogen, UA 0.2 0.0 - 1.0 mg/dL   Nitrite NEGATIVE NEGATIVE   Leukocytes, UA SMALL (A) NEGATIVE  Urine microscopic-add on  Result Value Ref Range   Squamous Epithelial / LPF FEW (A) RARE   WBC, UA 3-6 <3 WBC/hpf   RBC / HPF 3-6 <3 RBC/hpf   Bacteria, UA MANY (A) RARE  CBC  Result Value Ref Range   WBC 10.6 (H) 4.0 - 10.5 K/uL   RBC 3.52 (L)  3.87 - 5.11 MIL/uL   Hemoglobin 10.9 (L) 12.0 - 15.0 g/dL   HCT 33.1 (L) 36.0 - 46.0 %   MCV 94.0 78.0 - 100.0 fL   MCH 31.0 26.0 - 34.0 pg   MCHC 32.9 30.0 - 36.0 g/dL   RDW 14.4 11.5 - 15.5 %   Platelets 107 (L) 150 - 400 K/uL  Basic metabolic panel  Result Value Ref Range   Sodium 139 135 - 145 mmol/L   Potassium 3.4 (L) 3.5 - 5.1 mmol/L   Chloride 111 101 - 111 mmol/L   CO2 22 22 - 32 mmol/L   Glucose, Bld 91 65 - 99 mg/dL   BUN 14 6 - 20 mg/dL   Creatinine, Ser 0.86 0.44 - 1.00 mg/dL   Calcium 8.5 (L) 8.9 - 10.3 mg/dL   GFR calc non Af Amer 55 (L) >60 mL/min   GFR calc Af Amer >60 >60  mL/min   Anion gap 6 5 - 15  CBC  Result Value Ref Range   WBC 8.7 4.0 - 10.5 K/uL   RBC 3.64 (L) 3.87 - 5.11 MIL/uL   Hemoglobin 11.6 (L) 12.0 - 15.0 g/dL   HCT 34.5 (L) 36.0 - 46.0 %   MCV 94.8 78.0 - 100.0 fL   MCH 31.9 26.0 - 34.0 pg   MCHC 33.6 30.0 - 36.0 g/dL   RDW 14.5 11.5 - 15.5 %   Platelets 119 (L) 150 - 400 K/uL  Basic metabolic panel  Result Value Ref Range   Sodium 138 135 - 145 mmol/L   Potassium 3.7 3.5 - 5.1 mmol/L   Chloride 109 101 - 111 mmol/L   CO2 23 22 - 32 mmol/L   Glucose, Bld 94 65 - 99 mg/dL   BUN 14 6 - 20 mg/dL   Creatinine, Ser 0.96 0.44 - 1.00 mg/dL   Calcium 8.6 (L) 8.9 - 10.3 mg/dL   GFR calc non Af Amer 48 (L) >60 mL/min   GFR calc Af Amer 56 (L) >60 mL/min   Anion gap 6 5 - 15  I-Stat CG4 Lactic Acid, ED (Not at [facility])  Result Value Ref Range   Lactic Acid, Venous 2.26 (HH) 0.5 - 2.0 mmol/L   Comment NOTIFIED PHYSICIAN   I-Stat CG4 Lactic Acid, ED (Not at [facility])  Result Value Ref Range   Lactic Acid, Venous 1.74 0.5 - 2.0 mmol/L   Ct Abdomen Pelvis Wo Contrast  09/12/2014   CLINICAL DATA:  Fever with body aches, nausea, vomiting, loose stools and low abdominal pain since last night. History of diverticulitis and partial colon resection. Initial encounter.  EXAM: CT ABDOMEN AND PELVIS WITHOUT CONTRAST  TECHNIQUE: Multidetector CT imaging of the abdomen and pelvis was performed following the standard protocol without IV contrast.  COMPARISON:  CT 08/10/2013 and 07/05/2013.  FINDINGS: Lower chest: Clear lung bases. No significant pleural or pericardial effusion. Large hiatal hernia again noted.  Hepatobiliary: Mild hepatic steatosis. As evaluated in the noncontrast state, the liver appear stable without focal abnormality. No evidence of gallstones or gallbladder wall thickening. Mild chronic biliary dilatation appears unchanged.  Pancreas: The pancreas also appear stable with mild prominence of its main duct. No focal pancreatic lesion or surrounding  inflammatory change identified.  Spleen: Normal in size without focal abnormality.  Adrenals/Urinary Tract: Both adrenal glands appear normal. As imaged in the noncontrast state, the kidneys appear stable without acute or suspicious findings. There is mild cortical thinning bilaterally. No hydronephrosis. The bladder appears  unremarkable.  Stomach/Bowel: No evidence of bowel wall thickening, distention or surrounding inflammatory change. Extensive diverticular changes are again noted throughout the colon and the terminal ileum. As above, there is a large hiatal hernia which appears unchanged.  Vascular/Lymphatic: Mildly prominent retroperitoneal and right ileocolonic mesenteric lymph nodes are grossly stable. There is minimal atherosclerosis for age.  Reproductive: Hysterectomy.  No evidence of adnexal mass.  Other: Stable small umbilical hernia containing only fat)  Musculoskeletal: No acute or significant osseous findings. Extensive spondylosis with posterior osteophytes and convex right lumbar scoliosis, similar to prior study.  IMPRESSION: 1. No acute findings or explanation for the patient's symptoms. 2. Extensive colonic and distal small bowel diverticulosis without evidence of acute inflammation. 3. Stable mild chronic biliary and pancreatic ductal dilatation. 4. Large hiatal hernia.   Electronically Signed   By: Richardean Sale M.D.   On: 09/12/2014 09:45   Dg Chest 2 View  09/12/2014   CLINICAL DATA:  Chills and generalized body aches beginning last night. Shortness of breath. History of pneumonia, CHF, hypertension.  EXAM: CHEST  2 VIEW  COMPARISON:  Chest radiograph December 04, 2010  FINDINGS: The cardiac silhouette appears mildly enlarged. Mediastinal silhouette is nonsuspicious. No pleural effusion or focal consolidation. No pneumothorax. Moderate to severe degenerative change of the thoracolumbar junction.  IMPRESSION: Mild cardiomegaly, no acute pulmonary process.   Electronically Signed   By:  Elon Alas M.D.   On: 09/12/2014 05:59     Imaging Review No results found. I, Avia Merkley A, personally reviewed and evaluated these images and lab results as part of my medical decision-making.   EKG Interpretation None      MDM   Final diagnoses:  None    1. Febrile illness 2. UTI   The patient is alert, awake, oriented and non-toxic in appearance. Mild leukocytosis, elevated lactate. Anticipate admission.   She remains comfortable and oriented. Evidence UTI with mild lactic acidosis. Will admit for IV abx. Discussed with Dr. Alcario Drought, [facility] who accepts for admission. CT abdomen pending.      Charlann Lange, PA-C 09/15/14 1456  Rolland Porter, MD 09/21/14 2300

## 2014-09-12 NOTE — ED Notes (Signed)
Bed: WE99 Expected date:  Expected time:  Means of arrival:  Comments: 74F fever/chills

## 2014-09-12 NOTE — ED Notes (Signed)
Brought in by EMS from home with c/o chills and generalized body aches.  Pt reported that she woke up last night at around 2230 "with chills and feels cold and body aches, especially the legs".  Pt also c/o "short of breath"---- O2 sat 92% on room air by EMS, was placed on 2L/min via nasal cannula.  Pt arrived to ED with rectal temp of T101.

## 2014-09-12 NOTE — H&P (Signed)
History and Physical    [name] XHB:[id number] DOB: 1916-11-10 DOA: 09/12/2014  Referring physician: Dr. Tomi Bamberger PCP: Henrine Screws, MD  Specialists: none  Chief Complaint: fever, abdominal pain  HPI: [name] is a 81 y.o. female has a past medical history significant for HTN, CAD, prior diverticulosis presents to the ED with chief complaint of fever. She endorses feeling feverish yesterday, and started having abdominal pain. She endorses nausea and dry heaves but no frank vomiting. Her abdominal pain is mainly left lower quadrant. With her fever she has also felt very weak and with decreased energy. She denies any diarrhea or constipation, she denies blood in her stools. She endorses chronic occasional chest pains for the past year, coming and going, "band-like" above her abdomen. She also endorses shortness of breath for a long time, however she is fully functional and able to do all of her ADLs. She denies LE swelling, denies orthopnea. She denies any dysuria but reports nocturia which is unusual.  In the ED, patient was febrile to 101, mildly tachycardic to 99, with normal blood pressure. Labs show mild leukocytosis to 11.8, elevated lactic acid to 2.26. Her Cr is 1.1 and is mildly thrombocytopenic to 121. CT scan of her abdomen and pelvis is pending and [facility] was asked for admission.   Review of Systems: as per HPI otherwise 10 point ROS negative  Past Medical History  Diagnosis Date  . Hyperglycemia   . CAD (coronary artery disease)   . Pneumonia     july 2009  . Hyperlipidemia   . Hormone replacement therapy (postmenopausal)   . Diverticulosis   . DJD (degenerative joint disease) of lumbar spine     scoliosis  . DJD (degenerative joint disease) of knee   . Sciatica     right lower extremity  . Allergic rhinitis   . Reactive depression (situational)   . Shingles     right thoracic 2008  . Eczema   . GI bleed   . Diverticular disease   . Esophageal  erosions     from nsaid   . Cough   . Orthopnea   . Vertigo   . CHF (congestive heart failure)   . Hypertension    Past Surgical History  Procedure Laterality Date  . Back surgery    . Total abdominal hysterectomy    . Breast lumpectomy    . Appendectomy    . Flexible sigmoidoscopy Left 08/13/2013    Procedure: FLEXIBLE SIGMOIDOSCOPY;  Surgeon: Arta Silence, MD;  Location: [facility];  Service: Endoscopy;  Laterality: Left;   Social History:  reports that she has never smoked. She has never used smokeless tobacco. She reports that she drinks alcohol. She reports that she does not use illicit drugs.  Allergies  Allergen Reactions  . Ace Inhibitors Hives and Itching  . Penicillins     Pruritic rash  . Streptomycin     Pruritus   . Tramadol     unknown    Family History  Problem Relation Age of Onset  . Heart disease Daughter   . Heart disease Son   . Heart disease Father   . Heart disease Mother   . Cancer Brother   . Heart disease Sister     Prior to Admission medications   Medication Sig Start Date End Date Taking? Authorizing Provider  acetaminophen (TYLENOL) 500 MG tablet Take 1,000 mg by mouth every 6 (six) hours as needed for moderate pain.   Yes  Historical Provider, MD  ASPIRIN PO Take 81 mg by mouth every other day.    Yes Historical Provider, MD  atenolol (TENORMIN) 25 MG tablet Take 25 mg by mouth every morning.    Yes Historical Provider, MD  citalopram (CELEXA) 40 MG tablet Take 40 mg by mouth every morning.    Yes Historical Provider, MD  clotrimazole (LOTRIMIN) 1 % cream Apply 1 application topically as needed (foot -- infection/rash).    Yes Historical Provider, MD  estrogens, conjugated, (PREMARIN) 0.3 MG tablet Take 0.3 mg by mouth every morning.    Yes Historical Provider, MD  furosemide (LASIX) 40 MG tablet Take 40 mg by mouth every other day.    Yes Historical Provider, MD  isosorbide mononitrate (IMDUR) 30 MG 24 hr tablet Take 1 tablet (30 mg  total) by mouth daily. 01/08/13  Yes Jettie Booze, MD  ketoconazole (NIZORAL) 2 % cream Apply 1 application topically 2 (two) times daily as needed for irritation.   Yes Historical Provider, MD  loperamide (AF-LOPERAMIDE HCL) 2 MG tablet Take 2 mg by mouth as needed for diarrhea or loose stools. 1 capsule up to 8 tablets per 24hour 8 times a day   Yes Historical Provider, MD  meclizine (ANTIVERT) 25 MG tablet Take 25 mg by mouth 2 (two) times daily as needed for dizziness or nausea.    Yes Historical Provider, MD  Polyvinyl Alcohol-Povidone (REFRESH OP) Apply 1 drop to eye 2 (two) times daily.   Yes Historical Provider, MD  potassium chloride (KLOR-CON) 10 MEQ CR tablet Take 10 mEq by mouth 3 (three) times daily.    Yes Historical Provider, MD  Prenatal Vit-Fe Fumarate-FA (PRENATAL PO) Take 1 tablet by mouth daily.   Yes Historical Provider, MD  triamcinolone cream (KENALOG) 0.1 % Apply 1 application topically 2 (two) times daily as needed (rash).    Yes Historical Provider, MD   Physical Exam: Filed Vitals:   09/12/14 0436 09/12/14 0440 09/12/14 0452 09/12/14 0610  BP: 113/67   106/55  Pulse: 99   85  Temp: 97.9 F (36.6 C) 101.9 F (38.8 C)  98.2 F (36.8 C)  TempSrc: Oral Rectal  Oral  Resp: 18   18  Height:   5\' 6"  (1.676 m)   Weight:   68.04 kg (150 lb)   SpO2: 94%   99%     GENERAL: NAD, very pleasant  HEENT: head NCAT, no scleral icterus.  NECK: Supple.   LUNGS: Clear to auscultation. No wheezing or crackles  HEART: Regular rate and rhythm without murmur. 2+ pulses, no JVD, no peripheral edema  ABDOMEN: Soft, tender LLQ mostly, and nondistended. Positive bowel sounds.  EXTREMITIES: Without any cyanosis, clubbing, rash, lesions or edema.  NEUROLOGIC: Alert and oriented x3. Strength 5/5 in all 4.  PSYCHIATRIC: Normal mood and affect   Labs on Admission:  Basic Metabolic Panel:  Recent Labs Lab 09/12/14 0515  NA 138  K 3.5  CL 105  CO2 23  GLUCOSE  108*  BUN 22*  CREATININE 1.10*  CALCIUM 9.2   Liver Function Tests:  Recent Labs Lab 09/12/14 0515  AST 24  ALT 11*  ALKPHOS 56  BILITOT 0.7  PROT 6.6  ALBUMIN 3.5   CBC:  Recent Labs Lab 09/12/14 0515  WBC 11.8*  NEUTROABS 11.0*  HGB 12.6  HCT 38.2  MCV 92.9  PLT 121*   Radiological Exams on Admission: Dg Chest 2 View  09/12/2014   CLINICAL DATA:  Chills  and generalized body aches beginning last night. Shortness of breath. History of pneumonia, CHF, hypertension.  EXAM: CHEST  2 VIEW  COMPARISON:  Chest radiograph December 04, 2010  FINDINGS: The cardiac silhouette appears mildly enlarged. Mediastinal silhouette is nonsuspicious. No pleural effusion or focal consolidation. No pneumothorax. Moderate to severe degenerative change of the thoracolumbar junction.  IMPRESSION: Mild cardiomegaly, no acute pulmonary process.   Electronically Signed   By: Elon Alas M.D.   On: 09/12/2014 05:59    Assessment/Plan Principal Problem:   Diverticulitis Active Problems:   Febrile illness   CKD (chronic kidney disease) stage 3, GFR 30-59 ml/min   HTN (hypertension)   Thrombocytopenia   UTI - with fever, lower abdominal pain, nocturia, positive UA, CT scan without evidence of diverticulitis - continue ciprofloxacin (allergic to penicillin), urine cultures sent and pending  HTN - resume her home medications  CKD III - Cr at bseline, IVF  Thrombocytopenia - chronic, no bleeding noted   Diet: NPO Fluids: NS DVT Prophylaxis: SCD  Code Status: DNR  Family Communication: nephew bedside  Disposition Plan: admit to MedSurg      Leslieanne Cobarrubias M. Cruzita Lederer, MD [facility] Pager [phone]  If 7PM-7AM, please contact night-coverage [email or web address] Password [facility] 09/12/2014, 7:42 AM

## 2014-09-12 NOTE — ED Notes (Signed)
Pt to have CT scan prior to transfer to floor unit.

## 2014-09-13 DIAGNOSIS — I159 Secondary hypertension, unspecified: Secondary | ICD-10-CM

## 2014-09-13 LAB — BASIC METABOLIC PANEL
ANION GAP: 6 (ref 5–15)
BUN: 14 mg/dL (ref 6–20)
CO2: 22 mmol/L (ref 22–32)
Calcium: 8.5 mg/dL — ABNORMAL LOW (ref 8.9–10.3)
Chloride: 111 mmol/L (ref 101–111)
Creatinine, Ser: 0.86 mg/dL (ref 0.44–1.00)
GFR, EST NON AFRICAN AMERICAN: 55 mL/min — AB (ref >60)
Glucose, Bld: 91 mg/dL (ref 65–99)
POTASSIUM: 3.4 mmol/L — AB (ref 3.5–5.1)
SODIUM: 139 mmol/L (ref 135–145)

## 2014-09-13 LAB — CBC
HCT: 33.1 % — ABNORMAL LOW (ref 36.0–46.0)
HEMOGLOBIN: 10.9 g/dL — AB (ref 12.0–15.0)
MCH: 31 pg (ref 26.0–34.0)
MCHC: 32.9 g/dL (ref 30.0–36.0)
MCV: 94 fL (ref 78.0–100.0)
PLATELETS: 107 10*3/uL — AB (ref 150–400)
RBC: 3.52 MIL/uL — AB (ref 3.87–5.11)
RDW: 14.4 % (ref 11.5–15.5)
WBC: 10.6 10*3/uL — AB (ref 4.0–10.5)

## 2014-09-13 LAB — URINE CULTURE

## 2014-09-13 MED ORDER — CITALOPRAM HYDROBROMIDE 20 MG PO TABS
20.0000 mg | ORAL_TABLET | Freq: Every day | ORAL | Status: DC
  Administered 2014-09-14: 20 mg via ORAL
  Filled 2014-09-13: qty 1

## 2014-09-13 MED ORDER — CIPROFLOXACIN HCL 500 MG PO TABS
500.0000 mg | ORAL_TABLET | Freq: Two times a day (BID) | ORAL | Status: DC
  Administered 2014-09-13 – 2014-09-14 (×3): 500 mg via ORAL
  Filled 2014-09-13 (×6): qty 1

## 2014-09-13 MED ORDER — LOPERAMIDE HCL 2 MG PO CAPS
2.0000 mg | ORAL_CAPSULE | ORAL | Status: DC | PRN
  Administered 2014-09-13: 2 mg via ORAL
  Filled 2014-09-13: qty 1

## 2014-09-13 MED ORDER — METRONIDAZOLE 500 MG PO TABS
500.0000 mg | ORAL_TABLET | Freq: Three times a day (TID) | ORAL | Status: DC
  Filled 2014-09-13 (×3): qty 1

## 2014-09-13 MED ORDER — POTASSIUM CHLORIDE CRYS ER 20 MEQ PO TBCR
40.0000 meq | EXTENDED_RELEASE_TABLET | Freq: Once | ORAL | Status: AC
  Administered 2014-09-13: 40 meq via ORAL
  Filled 2014-09-13: qty 2

## 2014-09-13 NOTE — Care Management Note (Signed)
Case Management Note  Patient Details  Name: JOSEPHYNE TARTER MRN: [id number] Date of Birth: Oct 02, 1916  Subjective/Objective:                 Early diverticulitis nausea and vomiting temp on arrival of 101.   Action/Plan: Date:  September 13, 2014 U.R. performed for needs and level of care. Will continue to follow for Case Management needs.  Velva Harman, RN, BSN, [place]   [phone]  Expected Discharge Date:   (unknown)               Expected Discharge Plan:  Home/Self Care  In-House Referral:  NA  Discharge planning Services  CM Consult  Post Acute Care Choice:  NA Choice offered to:  NA  DME Arranged:  N/A DME Agency:  NA  HH Arranged:  NA HH Agency:  NA  Status of Service:  In process, will continue to follow  Medicare Important Message Given:    Date Medicare IM Given:    Medicare IM give by:    Date Additional Medicare IM Given:    Additional Medicare Important Message give by:     If discussed at [place] of Stay Meetings, dates discussed:    Additional Comments:  Leeroy Cha, RN 09/13/2014, 10:26 AM

## 2014-09-13 NOTE — Progress Notes (Signed)
[facility]                                                                              Patient Demographics  [name], is a 81 y.o. female, DOB - 1916-06-26, WJX:[id number]  Admit date - 09/12/2014   Admitting Physician Costin Karlyne Greenspan, MD  Outpatient Primary MD for the patient is Henrine Screws, MD  LOS - 1   Chief Complaint  Patient presents with  . Fever  . Generalized Body Aches      Interim history 81 year old female with history of hypertension, prior diverticulosis presented to the emergency department with complaints of abdominal pain and fever. CT scan of her abdomen and pelvis showed no acute findings. Patient did have diverticulosis without acute inflammation. Patient was admitted for urinary tract infection and started on Cipro due to a penicillin allergy. Urine culture still pending. Expect discharge in the next 24 hours.  Assessment & Plan   Urinary tract infection -Urine culture still pending -Patient started on ciprofloxacin due to penicillin allergy -Leukocytosis improving, patient afebrile -Patient was empirically started on Flagyl, will discontinue.  Hypertension -Blood pressure currently stable. Continue Imdur and atenolol.  Chronic kidney disease, stage III -Creatinine currently 0.86, continue to monitor BMP  Thrombocytopenia -Platelets 107, currently no bleeding -Continue to monitor CBC  Depression -Continue Celexa, given age dose decreased to 20 mg daily  Hypokalemia -Will replace, continue to monitor BMP.  Code Status: DO NOT RESUSCITATE  Family Communication: None at bedside  Disposition Plan: Pending urine culture. Likely discharge within 24 hours.  Time Spent in minutes   30 minutes  Procedures  None  Consults   None  DVT Prophylaxis  SCDs  Lab Results  Component Value Date   PLT 107* 09/13/2014    Medications  Scheduled Meds: . atenolol  25 mg Oral Daily  . ciprofloxacin  500 mg Oral BID  .  [START ON 09/14/2014] citalopram  20 mg Oral Daily  . estrogens (conjugated)  0.3 mg Oral Daily  . isosorbide mononitrate  30 mg Oral Daily  . metroNIDAZOLE  500 mg Oral 3 times per day  . potassium chloride  40 mEq Oral Once   Continuous Infusions: . sodium chloride 50 mL/hr at 09/13/14 0430   PRN Meds:.acetaminophen, loperamide, meclizine  Antibiotics    Anti-infectives    Start     Dose/Rate Route Frequency Ordered Stop   09/13/14 1500  metroNIDAZOLE (FLAGYL) tablet 500 mg     500 mg Oral 3 times per day 09/13/14 1052     09/13/14 1200  ciprofloxacin (CIPRO) tablet 500 mg     500 mg Oral 2 times daily 09/13/14 1056     09/12/14 0745  ciprofloxacin (CIPRO) IVPB 400 mg  Status:  Discontinued     400 mg 200 mL/hr over 60 Minutes Intravenous Every 12 hours 09/12/14 0732 09/13/14 1056   09/12/14 0745  metroNIDAZOLE (FLAGYL) IVPB 500 mg  Status:  Discontinued     500 mg 100 mL/hr over 60 Minutes Intravenous Every 8 hours 09/12/14 0732 09/13/14 1052       Subjective:   [name] seen and examined today. He states she's feeling better. Still  has some abdominal pain but improving.  Denies chest pain, SOB, nausea/vomiting.   Objective:   Filed Vitals:   09/12/14 0755 09/12/14 0845 09/12/14 2043 09/13/14 0624  BP: 113/50 104/42 117/48 132/70  Pulse: 79 76 70 68  Temp:  98.2 F (36.8 C) 98.4 F (36.9 C) 97.9 F (36.6 C)  TempSrc:  Oral Oral Oral  Resp: 16 18 18 18   Height:      Weight:      SpO2: 98% 93% 94% 96%    Wt Readings from Last 3 Encounters:  09/12/14 68.04 kg (150 lb)  07/11/14 71.215 kg (157 lb)  01/12/14 72.938 kg (160 lb 12.8 oz)     Intake/Output Summary (Last 24 hours) at 09/13/14 1307 Last data filed at 09/13/14 0603  Gross per 24 hour  Intake 1087.5 ml  Output      0 ml  Net 1087.5 ml    Exam  General: Well developed, well nourished,  No distress  HEENT: NCAT, mucous membranes moist.   Cardiovascular: S1 S2 auscultated, RRR, no  murmurs  Respiratory: Clear to auscultation bilaterally with equal chest rise  Abdomen: Soft, nontender, nondistended, + bowel sounds  Extremities: warm dry without cyanosis clubbing or edema  Neuro: AAOx3, nonfocal  Psych: Normal affect and demeanor   Data Review   Micro Results Recent Results (from the past 240 hour(s))  Culture, blood (routine x 2)     Status: None (Preliminary result)   Collection Time: 09/12/14  5:05 AM  Result Value Ref Range Status   Specimen Description BLOOD RIGHT HAND  Final   Special Requests BOTTLES DRAWN AEROBIC AND ANAEROBIC 5.5ML  Final   Culture   Final    NO GROWTH 1 DAY Performed at [facility]    Report Status PENDING  Incomplete  Culture, blood (routine x 2)     Status: None (Preliminary result)   Collection Time: 09/12/14  5:13 AM  Result Value Ref Range Status   Specimen Description BLOOD LEFT ANTECUBITAL  Final   Special Requests BOTTLES DRAWN AEROBIC AND ANAEROBIC 5ML  Final   Culture   Final    NO GROWTH 1 DAY Performed at [facility]    Report Status PENDING  Incomplete    Radiology Reports Ct Abdomen Pelvis Wo Contrast  09/12/2014   CLINICAL DATA:  Fever with body aches, nausea, vomiting, loose stools and low abdominal pain since last night. History of diverticulitis and partial colon resection. Initial encounter.  EXAM: CT ABDOMEN AND PELVIS WITHOUT CONTRAST  TECHNIQUE: Multidetector CT imaging of the abdomen and pelvis was performed following the standard protocol without IV contrast.  COMPARISON:  CT 08/10/2013 and 07/05/2013.  FINDINGS: Lower chest: Clear lung bases. No significant pleural or pericardial effusion. Large hiatal hernia again noted.  Hepatobiliary: Mild hepatic steatosis. As evaluated in the noncontrast state, the liver appear stable without focal abnormality. No evidence of gallstones or gallbladder wall thickening. Mild chronic biliary dilatation appears unchanged.  Pancreas: The pancreas also  appear stable with mild prominence of its main duct. No focal pancreatic lesion or surrounding inflammatory change identified.  Spleen: Normal in size without focal abnormality.  Adrenals/Urinary Tract: Both adrenal glands appear normal. As imaged in the noncontrast state, the kidneys appear stable without acute or suspicious findings. There is mild cortical thinning bilaterally. No hydronephrosis. The bladder appears unremarkable.  Stomach/Bowel: No evidence of bowel wall thickening, distention or surrounding inflammatory change. Extensive diverticular changes are again noted throughout the colon  and the terminal ileum. As above, there is a large hiatal hernia which appears unchanged.  Vascular/Lymphatic: Mildly prominent retroperitoneal and right ileocolonic mesenteric lymph nodes are grossly stable. There is minimal atherosclerosis for age.  Reproductive: Hysterectomy.  No evidence of adnexal mass.  Other: Stable small umbilical hernia containing only fat)  Musculoskeletal: No acute or significant osseous findings. Extensive spondylosis with posterior osteophytes and convex right lumbar scoliosis, similar to prior study.  IMPRESSION: 1. No acute findings or explanation for the patient's symptoms. 2. Extensive colonic and distal small bowel diverticulosis without evidence of acute inflammation. 3. Stable mild chronic biliary and pancreatic ductal dilatation. 4. Large hiatal hernia.   Electronically Signed   By: Richardean Sale M.D.   On: 09/12/2014 09:45   Dg Chest 2 View  09/12/2014   CLINICAL DATA:  Chills and generalized body aches beginning last night. Shortness of breath. History of pneumonia, CHF, hypertension.  EXAM: CHEST  2 VIEW  COMPARISON:  Chest radiograph December 04, 2010  FINDINGS: The cardiac silhouette appears mildly enlarged. Mediastinal silhouette is nonsuspicious. No pleural effusion or focal consolidation. No pneumothorax. Moderate to severe degenerative change of the thoracolumbar junction.   IMPRESSION: Mild cardiomegaly, no acute pulmonary process.   Electronically Signed   By: Elon Alas M.D.   On: 09/12/2014 05:59    CBC  Recent Labs Lab 09/12/14 0515 09/13/14 0544  WBC 11.8* 10.6*  HGB 12.6 10.9*  HCT 38.2 33.1*  PLT 121* 107*  MCV 92.9 94.0  MCH 30.7 31.0  MCHC 33.0 32.9  RDW 13.9 14.4  LYMPHSABS 0.4*  --   MONOABS 0.3  --   EOSABS 0.0  --   BASOSABS 0.0  --     Chemistries   Recent Labs Lab 09/12/14 0515 09/13/14 0544  NA 138 139  K 3.5 3.4*  CL 105 111  CO2 23 22  GLUCOSE 108* 91  BUN 22* 14  CREATININE 1.10* 0.86  CALCIUM 9.2 8.5*  AST 24  --   ALT 11*  --   ALKPHOS 56  --   BILITOT 0.7  --    ------------------------------------------------------------------------------------------------------------------ estimated creatinine clearance is 35.8 mL/min (by C-G formula based on Cr of 0.86). ------------------------------------------------------------------------------------------------------------------ No results for input(s): HGBA1C in the last 72 hours. ------------------------------------------------------------------------------------------------------------------ No results for input(s): CHOL, HDL, LDLCALC, TRIG, CHOLHDL, LDLDIRECT in the last 72 hours. ------------------------------------------------------------------------------------------------------------------ No results for input(s): TSH, T4TOTAL, T3FREE, THYROIDAB in the last 72 hours.  Invalid input(s): FREET3 ------------------------------------------------------------------------------------------------------------------ No results for input(s): VITAMINB12, FOLATE, FERRITIN, TIBC, IRON, RETICCTPCT in the last 72 hours.  Coagulation profile No results for input(s): INR, PROTIME in the last 168 hours.  No results for input(s): DDIMER in the last 72 hours.  Cardiac Enzymes No results for input(s): CKMB, TROPONINI, MYOGLOBIN in the last 168 hours.  Invalid  input(s): CK ------------------------------------------------------------------------------------------------------------------ Invalid input(s): POCBNP    Yanelle Sousa D.O. on 09/13/2014 at 1:07 PM  Between 7am to 7pm - Pager - [phone]  After 7pm go to [email or web address] - password [email or web address]  And look for the night coverage person covering for me after hours  [facility] Office  [phone]

## 2014-09-14 DIAGNOSIS — R509 Fever, unspecified: Secondary | ICD-10-CM

## 2014-09-14 LAB — BASIC METABOLIC PANEL
ANION GAP: 6 (ref 5–15)
BUN: 14 mg/dL (ref 6–20)
CALCIUM: 8.6 mg/dL — AB (ref 8.9–10.3)
CO2: 23 mmol/L (ref 22–32)
Chloride: 109 mmol/L (ref 101–111)
Creatinine, Ser: 0.96 mg/dL (ref 0.44–1.00)
GFR calc non Af Amer: 48 mL/min — ABNORMAL LOW (ref >60)
GFR, EST AFRICAN AMERICAN: 56 mL/min — AB (ref >60)
Glucose, Bld: 94 mg/dL (ref 65–99)
POTASSIUM: 3.7 mmol/L (ref 3.5–5.1)
Sodium: 138 mmol/L (ref 135–145)

## 2014-09-14 LAB — CBC
HEMATOCRIT: 34.5 % — AB (ref 36.0–46.0)
HEMOGLOBIN: 11.6 g/dL — AB (ref 12.0–15.0)
MCH: 31.9 pg (ref 26.0–34.0)
MCHC: 33.6 g/dL (ref 30.0–36.0)
MCV: 94.8 fL (ref 78.0–100.0)
Platelets: 119 10*3/uL — ABNORMAL LOW (ref 150–400)
RBC: 3.64 MIL/uL — AB (ref 3.87–5.11)
RDW: 14.5 % (ref 11.5–15.5)
WBC: 8.7 10*3/uL (ref 4.0–10.5)

## 2014-09-14 MED ORDER — CIPROFLOXACIN HCL 500 MG PO TABS: 500.0000 mg | ORAL_TABLET | Freq: Two times a day (BID) | ORAL | Status: DC

## 2014-09-14 NOTE — Discharge Instructions (Signed)

## 2014-09-14 NOTE — Discharge Summary (Signed)
Physician Discharge Summary  [name] YQM:[id number] DOB: Nov 13, 1916 DOA: 09/12/2014  PCP: Henrine Screws, MD  Admit date: 09/12/2014 Discharge date: 09/14/2014  Recommendations for Outpatient Follow-up:  1. Pt will need to follow up with PCP in 2-3 weeks post discharge 2. Please obtain BMP to evaluate electrolytes and kidney function 3. Please also check CBC to evaluate Hg and Hct levels 4. Please note that pt wanted to go home prior to having final blood and urine culture back  ADDENDUM: pt made aware blood cultures positive for E. Coli and resistant to Cipro, pt advised to come back in direct admission to [facility]  Discharge Diagnoses:  Principal Problem:   Diverticulitis Active Problems:   Febrile illness   CKD (chronic kidney disease) stage 3, GFR 30-59 ml/min   HTN (hypertension)   Thrombocytopenia  Discharge Condition: Stable  Diet recommendation: Heart healthy diet discussed in details   Interim history 81 year old female with history of hypertension, prior diverticulosis presented to the emergency department with complaints of abdominal pain and fever. CT scan of her abdomen and pelvis showed no acute findings. Patient did have diverticulosis without acute inflammation. Patient was admitted for urinary tract infection and started on Cipro due to a penicillin allergy. Urine culture still pending. Expect discharge in the next 24 hours.  Assessment & Plan   Urinary tract infection -Urine culture still pending but pt wants to go home  -Patient started on ciprofloxacin due to penicillin allergy  -Leukocytosis improving, patient afebrile  Hypertension -Blood pressure currently stable. Continue Imdur and atenolol.  Chronic kidney disease, stage III -Creatinine currently stable   Thrombocytopenia -Platelets 107, currently no bleeding  Depression -Continue Celexa, given age dose decreased to 20 mg daily  Hypokalemia -Will replace, continue to monitor  BMP.  Code Status: DO NOT RESUSCITATE  Family Communication: None at bedside, pt declined my offer to call family, pt wants to go home   Time Spent in minutes 30 minutes  Procedures  None  Consults  None        Procedures/Studies: Ct Abdomen Pelvis Wo Contrast  09/12/2014   CLINICAL DATA:  Fever with body aches, nausea, vomiting, loose stools and low abdominal pain since last night. History of diverticulitis and partial colon resection. Initial encounter.  EXAM: CT ABDOMEN AND PELVIS WITHOUT CONTRAST  TECHNIQUE: Multidetector CT imaging of the abdomen and pelvis was performed following the standard protocol without IV contrast.  COMPARISON:  CT 08/10/2013 and 07/05/2013.  FINDINGS: Lower chest: Clear lung bases. No significant pleural or pericardial effusion. Large hiatal hernia again noted.  Hepatobiliary: Mild hepatic steatosis. As evaluated in the noncontrast state, the liver appear stable without focal abnormality. No evidence of gallstones or gallbladder wall thickening. Mild chronic biliary dilatation appears unchanged.  Pancreas: The pancreas also appear stable with mild prominence of its main duct. No focal pancreatic lesion or surrounding inflammatory change identified.  Spleen: Normal in size without focal abnormality.  Adrenals/Urinary Tract: Both adrenal glands appear normal. As imaged in the noncontrast state, the kidneys appear stable without acute or suspicious findings. There is mild cortical thinning bilaterally. No hydronephrosis. The bladder appears unremarkable.  Stomach/Bowel: No evidence of bowel wall thickening, distention or surrounding inflammatory change. Extensive diverticular changes are again noted throughout the colon and the terminal ileum. As above, there is a large hiatal hernia which appears unchanged.  Vascular/Lymphatic: Mildly prominent retroperitoneal and right ileocolonic mesenteric lymph nodes are grossly stable. There is minimal atherosclerosis for  age.  Reproductive: Hysterectomy.  No evidence of adnexal mass.  Other: Stable small umbilical hernia containing only fat)  Musculoskeletal: No acute or significant osseous findings. Extensive spondylosis with posterior osteophytes and convex right lumbar scoliosis, similar to prior study.  IMPRESSION: 1. No acute findings or explanation for the patient's symptoms. 2. Extensive colonic and distal small bowel diverticulosis without evidence of acute inflammation. 3. Stable mild chronic biliary and pancreatic ductal dilatation. 4. Large hiatal hernia.   Electronically Signed   By: Richardean Sale M.D.   On: 09/12/2014 09:45   Dg Chest 2 View  09/12/2014   CLINICAL DATA:  Chills and generalized body aches beginning last night. Shortness of breath. History of pneumonia, CHF, hypertension.  EXAM: CHEST  2 VIEW  COMPARISON:  Chest radiograph December 04, 2010  FINDINGS: The cardiac silhouette appears mildly enlarged. Mediastinal silhouette is nonsuspicious. No pleural effusion or focal consolidation. No pneumothorax. Moderate to severe degenerative change of the thoracolumbar junction.  IMPRESSION: Mild cardiomegaly, no acute pulmonary process.   Electronically Signed   By: Elon Alas M.D.   On: 09/12/2014 05:59    Discharge Exam: Filed Vitals:   09/14/14 0507  BP: 130/59  Pulse: 62  Temp: 98.1 F (36.7 C)  Resp: 18   Filed Vitals:   09/13/14 0624 09/13/14 1453 09/13/14 2132 09/14/14 0507  BP: 132/70 114/55 134/84 130/59  Pulse: 68 65 61 62  Temp: 97.9 F (36.6 C) 98 F (36.7 C) 98.3 F (36.8 C) 98.1 F (36.7 C)  TempSrc: Oral Oral Oral Oral  Resp: 18 18 18 18   Height:      Weight:      SpO2: 96% 97% 97% 97%    General: Pt is alert, follows commands appropriately, not in acute distress Cardiovascular: Regular rate and rhythm, no rubs, no gallops Respiratory: Clear to auscultation bilaterally, no wheezing, no crackles, no rhonchi Abdominal: Soft, non tender, non distended, bowel  sounds +, no guarding Extremities: no edema, no cyanosis, pulses palpable bilaterally DP and PT   Discharge Instructions  Discharge Instructions    Diet - low sodium heart healthy    Complete by:  As directed      Increase activity slowly    Complete by:  As directed             Medication List    TAKE these medications        acetaminophen 500 MG tablet  Commonly known as:  TYLENOL  Take 1,000 mg by mouth every 6 (six) hours as needed for moderate pain.     AF-LOPERAMIDE HCL 2 MG tablet  Generic drug:  loperamide  Take 2 mg by mouth as needed for diarrhea or loose stools. 1 capsule up to 8 tablets per 24hour 8 times a day     ASPIRIN PO  Take 81 mg by mouth every other day.     atenolol 25 MG tablet  Commonly known as:  TENORMIN  Take 25 mg by mouth every morning.     ciprofloxacin 500 MG tablet  Commonly known as:  CIPRO  Take 1 tablet (500 mg total) by mouth 2 (two) times daily.     citalopram 40 MG tablet  Commonly known as:  CELEXA  Take 40 mg by mouth every morning.     clotrimazole 1 % cream  Commonly known as:  LOTRIMIN  Apply 1 application topically as needed (foot -- infection/rash).     estrogens (conjugated) 0.3 MG tablet  Commonly known as:  PREMARIN  Take 0.3 mg by mouth every morning.     furosemide 40 MG tablet  Commonly known as:  LASIX  Take 40 mg by mouth every other day.     isosorbide mononitrate 30 MG 24 hr tablet  Commonly known as:  IMDUR  Take 1 tablet (30 mg total) by mouth daily.     ketoconazole 2 % cream  Commonly known as:  NIZORAL  Apply 1 application topically 2 (two) times daily as needed for irritation.     meclizine 25 MG tablet  Commonly known as:  ANTIVERT  Take 25 mg by mouth 2 (two) times daily as needed for dizziness or nausea.     potassium chloride 10 MEQ CR tablet  Commonly known as:  KLOR-CON  Take 10 mEq by mouth 3 (three) times daily.     PRENATAL PO  Take 1 tablet by mouth daily.     REFRESH OP   Apply 1 drop to eye 2 (two) times daily.     triamcinolone cream 0.1 %  Commonly known as:  KENALOG  Apply 1 application topically 2 (two) times daily as needed (rash).            Follow-up Information    Follow up with Henrine Screws, MD.   Specialty:  Internal Medicine   Contact information:   [address] [place] [address] [phone]       Call Faye Ramsay, MD.   Specialty:  Internal Medicine   Why:  As needed   Contact information:   [address] [place] [address] [phone]        The results of significant diagnostics from this hospitalization (including imaging, microbiology, ancillary and laboratory) are listed below for reference.     Microbiology: Recent Results (from the past 240 hour(s))  Culture, blood (routine x 2)     Status: None (Preliminary result)   Collection Time: 09/12/14  5:05 AM  Result Value Ref Range Status   Specimen Description BLOOD RIGHT HAND  Final   Special Requests BOTTLES DRAWN AEROBIC AND ANAEROBIC 5.5ML  Final   Culture   Final    NO GROWTH 1 DAY Performed at [facility]    Report Status PENDING  Incomplete  Culture, blood (routine x 2)     Status: None (Preliminary result)   Collection Time: 09/12/14  5:13 AM  Result Value Ref Range Status   Specimen Description BLOOD LEFT ANTECUBITAL  Final   Special Requests BOTTLES DRAWN AEROBIC AND ANAEROBIC 5ML  Final   Culture   Final    NO GROWTH 1 DAY Performed at [facility]    Report Status PENDING  Incomplete  Urine culture     Status: None   Collection Time: 09/12/14  6:01 AM  Result Value Ref Range Status   Specimen Description URINE, CLEAN CATCH  Final   Special Requests NONE  Final   Culture   Final    MULTIPLE SPECIES PRESENT, SUGGEST RECOLLECTION Performed at [facility]    Report Status 09/13/2014 FINAL  Final     Labs: Basic Metabolic Panel:  Recent Labs Lab  09/12/14 0515 09/13/14 0544 09/14/14 0524  NA 138 139 138  K 3.5 3.4* 3.7  CL 105 111 109  CO2 23 22 23   GLUCOSE 108* 91 94  BUN 22* 14 14  CREATININE 1.10* 0.86 0.96  CALCIUM 9.2 8.5* 8.6*   Liver Function Tests:  Recent Labs  Lab 09/12/14 0515  AST 24  ALT 11*  ALKPHOS 56  BILITOT 0.7  PROT 6.6  ALBUMIN 3.5   No results for input(s): LIPASE, AMYLASE in the last 168 hours. No results for input(s): AMMONIA in the last 168 hours. CBC:  Recent Labs Lab 09/12/14 0515 09/13/14 0544 09/14/14 0524  WBC 11.8* 10.6* 8.7  NEUTROABS 11.0*  --   --   HGB 12.6 10.9* 11.6*  HCT 38.2 33.1* 34.5*  MCV 92.9 94.0 94.8  PLT 121* 107* 119*   Cardiac Enzymes: No results for input(s): CKTOTAL, CKMB, CKMBINDEX, TROPONINI in the last 168 hours. BNP: BNP (last 3 results) No results for input(s): BNP in the last 8760 hours.  ProBNP (last 3 results) No results for input(s): PROBNP in the last 8760 hours.  CBG: No results for input(s): GLUCAP in the last 168 hours.   SIGNED: Time coordinating discharge: Over 30 minutes  Faye Ramsay, MD  [facility] 09/14/2014, 10:30 AM Pager [phone]  If 7PM-7AM, please contact night-coverage [email or web address] Password [email or web address]

## 2014-09-14 NOTE — Progress Notes (Signed)
MicroLab at [facility] called to notify RN that pt had gram neg rods in blood cultures.  Pt already d/c home on PO abx.  MD notified and said she would follow up to verify home abx are appropriate.  Kizzie Ide, RN

## 2014-09-16 LAB — CULTURE, BLOOD (ROUTINE X 2)

## 2014-09-17 LAB — CULTURE, BLOOD (ROUTINE X 2): Culture: NO GROWTH

## 2014-09-18 ENCOUNTER — Encounter (HOSPITAL_COMMUNITY): Payer: Self-pay

## 2014-09-18 ENCOUNTER — Inpatient Hospital Stay (HOSPITAL_COMMUNITY)
Admission: AD | Admit: 2014-09-18 | Discharge: 2014-09-20 | DRG: 872 | Disposition: A | Discharge status: Home Health | Payer: Medicare Other | Source: Ambulatory Visit | Attending: Internal Medicine | Admitting: Internal Medicine

## 2014-09-18 DIAGNOSIS — F4321 Adjustment disorder with depressed mood: Secondary | ICD-10-CM | POA: Diagnosis present

## 2014-09-18 DIAGNOSIS — Z881 Allergy status to other antibiotic agents status: Secondary | ICD-10-CM | POA: Diagnosis not present

## 2014-09-18 DIAGNOSIS — Z79890 Hormone replacement therapy (postmenopausal): Secondary | ICD-10-CM

## 2014-09-18 DIAGNOSIS — J309 Allergic rhinitis, unspecified: Secondary | ICD-10-CM | POA: Diagnosis present

## 2014-09-18 DIAGNOSIS — R221 Localized swelling, mass and lump, neck: Secondary | ICD-10-CM | POA: Diagnosis not present

## 2014-09-18 DIAGNOSIS — B9620 Unspecified Escherichia coli [E. coli] as the cause of diseases classified elsewhere: Secondary | ICD-10-CM | POA: Diagnosis present

## 2014-09-18 DIAGNOSIS — I129 Hypertensive chronic kidney disease with stage 1 through stage 4 chronic kidney disease, or unspecified chronic kidney disease: Secondary | ICD-10-CM | POA: Diagnosis present

## 2014-09-18 DIAGNOSIS — Z880 Allergy status to penicillin: Secondary | ICD-10-CM | POA: Diagnosis not present

## 2014-09-18 DIAGNOSIS — K119 Disease of salivary gland, unspecified: Secondary | ICD-10-CM | POA: Diagnosis not present

## 2014-09-18 DIAGNOSIS — R7881 Bacteremia: Secondary | ICD-10-CM | POA: Diagnosis not present

## 2014-09-18 DIAGNOSIS — N179 Acute kidney failure, unspecified: Secondary | ICD-10-CM | POA: Diagnosis present

## 2014-09-18 DIAGNOSIS — D696 Thrombocytopenia, unspecified: Secondary | ICD-10-CM | POA: Diagnosis present

## 2014-09-18 DIAGNOSIS — E785 Hyperlipidemia, unspecified: Secondary | ICD-10-CM | POA: Diagnosis present

## 2014-09-18 DIAGNOSIS — I509 Heart failure, unspecified: Secondary | ICD-10-CM | POA: Diagnosis present

## 2014-09-18 DIAGNOSIS — F329 Major depressive disorder, single episode, unspecified: Secondary | ICD-10-CM | POA: Diagnosis present

## 2014-09-18 DIAGNOSIS — M419 Scoliosis, unspecified: Secondary | ICD-10-CM | POA: Diagnosis present

## 2014-09-18 DIAGNOSIS — M5431 Sciatica, right side: Secondary | ICD-10-CM | POA: Diagnosis present

## 2014-09-18 DIAGNOSIS — M47816 Spondylosis without myelopathy or radiculopathy, lumbar region: Secondary | ICD-10-CM | POA: Diagnosis present

## 2014-09-18 DIAGNOSIS — Z90710 Acquired absence of both cervix and uterus: Secondary | ICD-10-CM | POA: Diagnosis not present

## 2014-09-18 DIAGNOSIS — N183 Chronic kidney disease, stage 3 (moderate): Secondary | ICD-10-CM | POA: Diagnosis present

## 2014-09-18 DIAGNOSIS — Z7982 Long term (current) use of aspirin: Secondary | ICD-10-CM

## 2014-09-18 DIAGNOSIS — I2510 Atherosclerotic heart disease of native coronary artery without angina pectoris: Secondary | ICD-10-CM | POA: Diagnosis present

## 2014-09-18 DIAGNOSIS — Z888 Allergy status to other drugs, medicaments and biological substances status: Secondary | ICD-10-CM | POA: Diagnosis not present

## 2014-09-18 DIAGNOSIS — Z79899 Other long term (current) drug therapy: Secondary | ICD-10-CM | POA: Diagnosis not present

## 2014-09-18 DIAGNOSIS — R220 Localized swelling, mass and lump, head: Secondary | ICD-10-CM

## 2014-09-18 DIAGNOSIS — Z885 Allergy status to narcotic agent status: Secondary | ICD-10-CM | POA: Diagnosis not present

## 2014-09-18 DIAGNOSIS — Z8249 Family history of ischemic heart disease and other diseases of the circulatory system: Secondary | ICD-10-CM | POA: Diagnosis not present

## 2014-09-18 DIAGNOSIS — M179 Osteoarthritis of knee, unspecified: Secondary | ICD-10-CM | POA: Diagnosis present

## 2014-09-18 LAB — COMPREHENSIVE METABOLIC PANEL
ALBUMIN: 3.5 g/dL (ref 3.5–5.0)
ALK PHOS: 51 U/L (ref 38–126)
ALT: 11 U/L — ABNORMAL LOW (ref 14–54)
ANION GAP: 10 (ref 5–15)
AST: 27 U/L (ref 15–41)
BUN: 15 mg/dL (ref 6–20)
CHLORIDE: 104 mmol/L (ref 101–111)
CO2: 25 mmol/L (ref 22–32)
Calcium: 9.2 mg/dL (ref 8.9–10.3)
Creatinine, Ser: 1.01 mg/dL — ABNORMAL HIGH (ref 0.44–1.00)
GFR calc non Af Amer: 45 mL/min — ABNORMAL LOW (ref >60)
GFR, EST AFRICAN AMERICAN: 53 mL/min — AB (ref >60)
GLUCOSE: 117 mg/dL — AB (ref 65–99)
POTASSIUM: 3.6 mmol/L (ref 3.5–5.1)
SODIUM: 139 mmol/L (ref 135–145)
Total Bilirubin: 0.7 mg/dL (ref 0.3–1.2)
Total Protein: 6.8 g/dL (ref 6.5–8.1)

## 2014-09-18 LAB — URINALYSIS, ROUTINE W REFLEX MICROSCOPIC
BILIRUBIN URINE: NEGATIVE
GLUCOSE, UA: NEGATIVE mg/dL
KETONES UR: NEGATIVE mg/dL
NITRITE: POSITIVE — AB
PH: 5.5 (ref 5.0–8.0)
Protein, ur: NEGATIVE mg/dL
SPECIFIC GRAVITY, URINE: 1.017 (ref 1.005–1.030)
Urobilinogen, UA: 0.2 mg/dL (ref 0.0–1.0)

## 2014-09-18 LAB — URINE MICROSCOPIC-ADD ON

## 2014-09-18 LAB — MAGNESIUM: MAGNESIUM: 1.5 mg/dL — AB (ref 1.7–2.4)

## 2014-09-18 LAB — CBC
HCT: 38.9 % (ref 36.0–46.0)
HEMOGLOBIN: 12.9 g/dL (ref 12.0–15.0)
MCH: 31.3 pg (ref 26.0–34.0)
MCHC: 33.2 g/dL (ref 30.0–36.0)
MCV: 94.4 fL (ref 78.0–100.0)
PLATELETS: 163 10*3/uL (ref 150–400)
RBC: 4.12 MIL/uL (ref 3.87–5.11)
RDW: 14 % (ref 11.5–15.5)
WBC: 8.1 10*3/uL (ref 4.0–10.5)

## 2014-09-18 LAB — PHOSPHORUS: Phosphorus: 2.5 mg/dL (ref 2.5–4.6)

## 2014-09-18 MED ORDER — ESTROGENS CONJUGATED 0.3 MG PO TABS
0.3000 mg | ORAL_TABLET | Freq: Every day | ORAL | Status: DC
  Administered 2014-09-18 – 2014-09-20 (×3): 0.3 mg via ORAL
  Filled 2014-09-18 (×4): qty 1

## 2014-09-18 MED ORDER — POTASSIUM CHLORIDE CRYS ER 10 MEQ PO TBCR
10.0000 meq | EXTENDED_RELEASE_TABLET | Freq: Every day | ORAL | Status: DC
  Administered 2014-09-18 – 2014-09-20 (×3): 10 meq via ORAL
  Filled 2014-09-18 (×3): qty 1

## 2014-09-18 MED ORDER — ISOSORBIDE MONONITRATE ER 30 MG PO TB24
30.0000 mg | ORAL_TABLET | Freq: Every day | ORAL | Status: DC
  Administered 2014-09-18 – 2014-09-20 (×3): 30 mg via ORAL
  Filled 2014-09-18 (×3): qty 1

## 2014-09-18 MED ORDER — DEXTROSE 5 % IV SOLN
2.0000 g | INTRAVENOUS | Status: DC
  Administered 2014-09-19 – 2014-09-20 (×2): 2 g via INTRAVENOUS
  Filled 2014-09-18 (×2): qty 2

## 2014-09-18 MED ORDER — ACETAMINOPHEN 500 MG PO TABS
1000.0000 mg | ORAL_TABLET | Freq: Four times a day (QID) | ORAL | Status: DC | PRN
  Administered 2014-09-18: 1000 mg via ORAL
  Filled 2014-09-18: qty 2

## 2014-09-18 MED ORDER — CITALOPRAM HYDROBROMIDE 20 MG PO TABS
40.0000 mg | ORAL_TABLET | Freq: Every day | ORAL | Status: DC
  Administered 2014-09-18 – 2014-09-20 (×3): 40 mg via ORAL
  Filled 2014-09-18 (×3): qty 2

## 2014-09-18 MED ORDER — SODIUM CHLORIDE 0.9 % IV SOLN
INTRAVENOUS | Status: DC
  Administered 2014-09-18 – 2014-09-20 (×4): via INTRAVENOUS

## 2014-09-18 MED ORDER — FUROSEMIDE 40 MG PO TABS
40.0000 mg | ORAL_TABLET | ORAL | Status: DC
  Administered 2014-09-20: 40 mg via ORAL
  Filled 2014-09-18 (×2): qty 1

## 2014-09-18 MED ORDER — KETOCONAZOLE 2 % EX CREA
1.0000 "application " | TOPICAL_CREAM | Freq: Two times a day (BID) | CUTANEOUS | Status: DC | PRN
  Filled 2014-09-18: qty 15

## 2014-09-18 MED ORDER — SODIUM CHLORIDE 0.9 % IJ SOLN
3.0000 mL | Freq: Two times a day (BID) | INTRAMUSCULAR | Status: DC
  Administered 2014-09-18 – 2014-09-20 (×2): 3 mL via INTRAVENOUS

## 2014-09-18 MED ORDER — ONDANSETRON HCL 4 MG PO TABS
4.0000 mg | ORAL_TABLET | Freq: Four times a day (QID) | ORAL | Status: DC | PRN
Start: 2014-09-18 — End: 2014-09-20

## 2014-09-18 MED ORDER — MECLIZINE HCL 25 MG PO TABS: 25.0000 mg | ORAL_TABLET | Freq: Two times a day (BID) | ORAL | Status: DC | PRN

## 2014-09-18 MED ORDER — ASPIRIN 81 MG PO CHEW
81.0000 mg | CHEWABLE_TABLET | ORAL | Status: DC
  Administered 2014-09-18 – 2014-09-20 (×2): 81 mg via ORAL
  Filled 2014-09-18 (×2): qty 1

## 2014-09-18 MED ORDER — ONDANSETRON HCL 4 MG/2ML IJ SOLN
4.0000 mg | Freq: Four times a day (QID) | INTRAMUSCULAR | Status: DC | PRN
  Administered 2014-09-19: 4 mg via INTRAVENOUS
  Filled 2014-09-18: qty 2

## 2014-09-18 MED ORDER — ATENOLOL 25 MG PO TABS
25.0000 mg | ORAL_TABLET | Freq: Every day | ORAL | Status: DC
  Administered 2014-09-18 – 2014-09-20 (×3): 25 mg via ORAL
  Filled 2014-09-18 (×3): qty 1

## 2014-09-18 MED ORDER — ENOXAPARIN SODIUM 40 MG/0.4ML ~~LOC~~ SOLN
40.0000 mg | Freq: Every day | SUBCUTANEOUS | Status: DC
  Administered 2014-09-18 – 2014-09-20 (×3): 40 mg via SUBCUTANEOUS
  Filled 2014-09-18 (×3): qty 0.4

## 2014-09-18 MED ORDER — CLOTRIMAZOLE 1 % EX CREA
1.0000 "application " | TOPICAL_CREAM | CUTANEOUS | Status: DC | PRN
  Filled 2014-09-18: qty 15

## 2014-09-18 MED ORDER — MAGNESIUM SULFATE 2 GM/50ML IV SOLN
2.0000 g | Freq: Once | INTRAVENOUS | Status: AC
  Administered 2014-09-18: 2 g via INTRAVENOUS
  Filled 2014-09-18: qty 50

## 2014-09-18 MED ORDER — CEFTRIAXONE SODIUM 2 G IJ SOLR
2.0000 g | INTRAMUSCULAR | Status: AC
  Administered 2014-09-18: 2 g via INTRAVENOUS
  Filled 2014-09-18: qty 2

## 2014-09-18 MED ORDER — LOPERAMIDE HCL 2 MG PO CAPS: 2.0000 mg | ORAL_CAPSULE | ORAL | Status: DC | PRN

## 2014-09-18 NOTE — Progress Notes (Signed)
ANTIBIOTIC CONSULT NOTE - INITIAL  Pharmacy Consult for Ceftriaxone Indication: E Coli Bacteremia  Allergies  Allergen Reactions  . Ace Inhibitors Hives and Itching  . Penicillins     Pruritic rash  . Streptomycin     Pruritus   . Tramadol     unknown    Patient Measurements: Height: 5\' 6"  (167.6 cm) Weight: 153 lb 3.5 oz (69.5 kg) IBW/kg (Calculated) : 59.3  Vital Signs: Temp: 98.2 F (36.8 C) (08/21 1257) Temp Source: Oral (08/21 1257) BP: 134/65 mmHg (08/21 1257) Pulse Rate: 68 (08/21 1257) Intake/Output from previous day:   Intake/Output from this shift:    Labs: No results for input(s): WBC, HGB, PLT, LABCREA, CREATININE in the last 72 hours. Estimated Creatinine Clearance: 32.1 mL/min (by C-G formula based on Cr of 0.96). No results for input(s): VANCOTROUGH, VANCOPEAK, VANCORANDOM, GENTTROUGH, GENTPEAK, GENTRANDOM, TOBRATROUGH, TOBRAPEAK, TOBRARND, AMIKACINPEAK, AMIKACINTROU, AMIKACIN in the last 72 hours.   Microbiology: Recent Results (from the past 720 hour(s))  Culture, blood (routine x 2)     Status: None   Collection Time: 09/12/14  5:05 AM  Result Value Ref Range Status   Specimen Description BLOOD RIGHT HAND  Final   Special Requests BOTTLES DRAWN AEROBIC AND ANAEROBIC 5.5ML  Final   Culture  Setup Time   Final    GRAM NEGATIVE RODS ANAEROBIC BOTTLE ONLY CRITICAL RESULT CALLED TO, READ BACK BY AND VERIFIED WITH: E CAUDLE RN 09/14/14 @ 20 M KELLY    Culture   Final    ESCHERICHIA COLI Performed at [facility]    Report Status 09/16/2014 FINAL  Final   Organism ID, Bacteria ESCHERICHIA COLI  Final      Susceptibility   Escherichia coli - MIC*    AMPICILLIN >=32 RESISTANT Resistant     CEFAZOLIN <=4 SENSITIVE Sensitive     CEFEPIME <=1 SENSITIVE Sensitive     CEFTAZIDIME <=1 SENSITIVE Sensitive     CEFTRIAXONE <=1 SENSITIVE Sensitive     CIPROFLOXACIN >=4 RESISTANT Resistant     GENTAMICIN <=1 SENSITIVE Sensitive     IMIPENEM  <=0.25 SENSITIVE Sensitive     TRIMETH/SULFA >=320 RESISTANT Resistant     AMPICILLIN/SULBACTAM >=32 RESISTANT Resistant     PIP/TAZO <=4 SENSITIVE Sensitive     * ESCHERICHIA COLI  Culture, blood (routine x 2)     Status: None   Collection Time: 09/12/14  5:13 AM  Result Value Ref Range Status   Specimen Description BLOOD LEFT ANTECUBITAL  Final   Special Requests BOTTLES DRAWN AEROBIC AND ANAEROBIC 5ML  Final   Culture   Final    NO GROWTH 5 DAYS Performed at [facility]    Report Status 09/17/2014 FINAL  Final  Urine culture     Status: None   Collection Time: 09/12/14  6:01 AM  Result Value Ref Range Status   Specimen Description URINE, CLEAN CATCH  Final   Special Requests NONE  Final   Culture   Final    MULTIPLE SPECIES PRESENT, SUGGEST RECOLLECTION Performed at [facility]    Report Status 09/13/2014 FINAL  Final    Medical History: Past Medical History  Diagnosis Date  . Hyperglycemia   . CAD (coronary artery disease)   . Pneumonia     july 2009  . Hyperlipidemia   . Hormone replacement therapy (postmenopausal)   . Diverticulosis   . DJD (degenerative joint disease) of lumbar spine     scoliosis  . DJD (degenerative  joint disease) of knee   . Sciatica     right lower extremity  . Allergic rhinitis   . Reactive depression (situational)   . Shingles     right thoracic 2008  . Eczema   . GI bleed   . Diverticular disease   . Esophageal erosions     from nsaid   . Cough   . Orthopnea   . Vertigo   . CHF (congestive heart failure)   . Hypertension     Medications:  Anti-infectives    Start     Dose/Rate Route Frequency Ordered Stop   09/19/14 1400  cefTRIAXone (ROCEPHIN) 2 g in dextrose 5 % 50 mL IVPB     2 g 100 mL/hr over 30 Minutes Intravenous Every 24 hours 09/18/14 1318     09/18/14 1330  cefTRIAXone (ROCEPHIN) 2 g in dextrose 5 % 50 mL IVPB     2 g 100 mL/hr over 30 Minutes Intravenous STAT 09/18/14 1317 09/19/14 1330       Assessment: 89 yoF recently discharged on 8/17 with Cipro PO, but asked to return on 8/21 for blood cultures with EColi resistant to oral antibiotics.  PMH includes Diverticulitis, CKD, CAD, CHF, and thrombocytopenia.  Pharmacy is consulted to dose ceftriaxone for bacteremia.  8/15 Blood cultures w/ Ecoli (Sens: cefazolin, cefepime, ceftazidime, ceftriaxone, gent, imipenem, Pip/Tazo.  Resistant: Amp, Amp/Sul, cipro, Trim/Sulfa)  Today, 09/18/2014:  Tm 98.2  WBC: pending collection  SCr 1.01, CrCl ~ 30 ml/min.  Repeat blood and urine cultures in progress   Goal of Therapy:  Appropriate abx dosing, eradication of infection.   Plan:   Ceftriaxone 2g IV q24h  Follow up renal fxn, culture results, and clinical course.  Pharmacy to s/o note writing, but will continue to follow peripherally.   Gretta Arab PharmD, BCPS Pager [phone] 09/18/2014 12:52 PM

## 2014-09-18 NOTE — H&P (Signed)
[facility] History and Physical  [name] PIR:[id number] DOB: 02-21-1916 DOA: 09/18/2014  Referring physician: Dr. Doyle Askew requested direct admission as blood cultures positive for E. Coli  PCP: Henrine Screws, MD   Chief Complaint: direct admission from home   HPI:  Pt is 81 yo female who was recently hospitalized from August 15th to August 17th, 2016 for UTI, was discharged on Ciprofloxacin. In a meantime blood culture final report came back positive for E. Coli and resistant Ciprofloxacin. Pt currently reports feeling tired but no specific concerns such as fevers, chills, no chest pain or dyspnea, no abd or urinary concerns. Pt called at home to come to [facility] for repeat blood cultures and treatment.   Assessment and Plan: Active Problems: E. Coli bacteremia - on recent blood cultures from August 15th, 2016 - will place pt on Rocephin as per sensitivity report - will repeat blood cultures - will also ask for UA, urine culture   HTN - continue home medical regimen   Chronic kidney disease, stage III - BMP pending   Thrombocytopenia - CBC requested   Depression - Continue Celexa per home medical regimen   DVT prophylaxis - Lovenox SQ   Radiological Exams on Admission: No results found.   Code Status: Full Family Communication: Pt at bedside Disposition Plan: Admit for further evaluation    [facility] [phone]   Review of Systems:  Constitutional: Negative for fever, chills. Negative for diaphoresis.  HENT: Negative for hearing loss, ear pain, nosebleeds, congestion, sore throat, neck pain, tinnitus and ear discharge.   Eyes: Negative for blurred vision, double vision, photophobia, pain, discharge and redness.  Respiratory: Negative for cough, hemoptysis, sputum production, shortness of breath, wheezing and stridor.   Cardiovascular: Negative for chest pain, palpitations, orthopnea, claudication and leg swelling.  Gastrointestinal: Negative  for nausea, vomiting and abdominal pain.  Genitourinary: Negative for dysuria, urgency, frequency, hematuria and flank pain.  Musculoskeletal: Negative for myalgias, back pain, joint pain and falls.  Skin: Negative for itching and rash.  Neurological: Negative for tingling, tremors, sensory change, speech change, focal weakness, LOC, and headaches.  Endo/Heme/Allergies: Negative for environmental allergies and polydipsia. Does not bruise/bleed easily.  Psychiatric/Behavioral: Negative for suicidal ideas. The patient is not nervous/anxious.      Past Medical History  Diagnosis Date  . Hyperglycemia   . CAD (coronary artery disease)   . Pneumonia     july 2009  . Hyperlipidemia   . Hormone replacement therapy (postmenopausal)   . Diverticulosis   . DJD (degenerative joint disease) of lumbar spine     scoliosis  . DJD (degenerative joint disease) of knee   . Sciatica     right lower extremity  . Allergic rhinitis   . Reactive depression (situational)   . Shingles     right thoracic 2008  . Eczema   . GI bleed   . Diverticular disease   . Esophageal erosions     from nsaid   . Cough   . Orthopnea   . Vertigo   . CHF (congestive heart failure)   . Hypertension     Past Surgical History  Procedure Laterality Date  . Back surgery    . Total abdominal hysterectomy    . Breast lumpectomy    . Appendectomy    . Flexible sigmoidoscopy Left 08/13/2013    Procedure: FLEXIBLE SIGMOIDOSCOPY;  Surgeon: Arta Silence, MD;  Location: [facility];  Service: Endoscopy;  Laterality: Left;    Social History:  reports that she has never smoked. She has never used smokeless tobacco. She reports that she drinks alcohol. She reports that she does not use illicit drugs.  Allergies  Allergen Reactions  . Ace Inhibitors Hives and Itching  . Penicillins     Pruritic rash  . Streptomycin     Pruritus   . Tramadol     unknown    Family History  Problem Relation Age of Onset  . Heart  disease Daughter   . Heart disease Son   . Heart disease Father   . Heart disease Mother   . Cancer Brother   . Heart disease Sister     Prior to Admission medications   Medication Sig Start Date End Date Taking? Authorizing Provider  acetaminophen (TYLENOL) 500 MG tablet Take 1,000 mg by mouth every 6 (six) hours as needed for moderate pain.    Historical Provider, MD  ASPIRIN PO Take 81 mg by mouth every other day.     Historical Provider, MD  atenolol (TENORMIN) 25 MG tablet Take 25 mg by mouth every morning.     Historical Provider, MD  ciprofloxacin (CIPRO) 500 MG tablet Take 1 tablet (500 mg total) by mouth 2 (two) times daily. 09/14/14   Theodis Blaze, MD  citalopram (CELEXA) 40 MG tablet Take 40 mg by mouth every morning.     Historical Provider, MD  clotrimazole (LOTRIMIN) 1 % cream Apply 1 application topically as needed (foot -- infection/rash).     Historical Provider, MD  estrogens, conjugated, (PREMARIN) 0.3 MG tablet Take 0.3 mg by mouth every morning.     Historical Provider, MD  furosemide (LASIX) 40 MG tablet Take 40 mg by mouth every other day.     Historical Provider, MD  isosorbide mononitrate (IMDUR) 30 MG 24 hr tablet Take 1 tablet (30 mg total) by mouth daily. 01/08/13   Jettie Booze, MD  ketoconazole (NIZORAL) 2 % cream Apply 1 application topically 2 (two) times daily as needed for irritation.    Historical Provider, MD  loperamide (AF-LOPERAMIDE HCL) 2 MG tablet Take 2 mg by mouth as needed for diarrhea or loose stools. 1 capsule up to 8 tablets per 24hour 8 times a day    Historical Provider, MD  meclizine (ANTIVERT) 25 MG tablet Take 25 mg by mouth 2 (two) times daily as needed for dizziness or nausea.     Historical Provider, MD  Polyvinyl Alcohol-Povidone (REFRESH OP) Apply 1 drop to eye 2 (two) times daily.    Historical Provider, MD  potassium chloride (KLOR-CON) 10 MEQ CR tablet Take 10 mEq by mouth 3 (three) times daily.     Historical Provider, MD   Prenatal Vit-Fe Fumarate-FA (PRENATAL PO) Take 1 tablet by mouth daily.    Historical Provider, MD  triamcinolone cream (KENALOG) 0.1 % Apply 1 application topically 2 (two) times daily as needed (rash).     Historical Provider, MD    Physical Exam: There were no vitals filed for this visit.  Physical Exam  Constitutional: Appears well-developed and well-nourished. No distress.  HENT: Normocephalic. External right and left ear normal. Oropharynx is clear and moist.  Eyes: Conjunctivae and EOM are normal. PERRLA, no scleral icterus.  Neck: Normal ROM. Neck supple. No JVD. No tracheal deviation. No thyromegaly.  CVS: RRR, S1/S2 +, no gallops, no carotid bruit.  Pulmonary: Effort and breath sounds normal, no stridor, rhonchi, wheezes, rales.  Abdominal: Soft. BS +,  no distension, tenderness, rebound or guarding.  Musculoskeletal: Normal range of motion. No edema and no tenderness.  Lymphadenopathy: No lymphadenopathy noted, cervical, inguinal. Neuro: Alert. Normal reflexes, muscle tone coordination. No cranial nerve deficit. Skin: Skin is warm and dry. No rash noted. Not diaphoretic. No erythema. No pallor.  Psychiatric: Normal mood and affect. Behavior, judgment, thought content normal.   Labs on Admission:  Basic Metabolic Panel:  Recent Labs Lab 09/12/14 0515 09/13/14 0544 09/14/14 0524  NA 138 139 138  K 3.5 3.4* 3.7  CL 105 111 109  CO2 23 22 23   GLUCOSE 108* 91 94  BUN 22* 14 14  CREATININE 1.10* 0.86 0.96  CALCIUM 9.2 8.5* 8.6*   Liver Function Tests:  Recent Labs Lab 09/12/14 0515  AST 24  ALT 11*  ALKPHOS 56  BILITOT 0.7  PROT 6.6  ALBUMIN 3.5   CBC:  Recent Labs Lab 09/12/14 0515 09/13/14 0544 09/14/14 0524  WBC 11.8* 10.6* 8.7  NEUTROABS 11.0*  --   --   HGB 12.6 10.9* 11.6*  HCT 38.2 33.1* 34.5*  MCV 92.9 94.0 94.8  PLT 121* 107* 119*    EKG: Pending    If 7PM-7AM, please contact night-coverage [email or web address] Password [email or web address] 09/18/2014,  12:45 PM

## 2014-09-19 DIAGNOSIS — R7881 Bacteremia: Principal | ICD-10-CM

## 2014-09-19 LAB — CBC
HEMATOCRIT: 35.2 % — AB (ref 36.0–46.0)
HEMOGLOBIN: 11.8 g/dL — AB (ref 12.0–15.0)
MCH: 31.6 pg (ref 26.0–34.0)
MCHC: 33.5 g/dL (ref 30.0–36.0)
MCV: 94.4 fL (ref 78.0–100.0)
Platelets: 151 10*3/uL (ref 150–400)
RBC: 3.73 MIL/uL — AB (ref 3.87–5.11)
RDW: 14.2 % (ref 11.5–15.5)
WBC: 8 10*3/uL (ref 4.0–10.5)

## 2014-09-19 LAB — BASIC METABOLIC PANEL
Anion gap: 7 (ref 5–15)
BUN: 17 mg/dL (ref 6–20)
CALCIUM: 8.5 mg/dL — AB (ref 8.9–10.3)
CHLORIDE: 106 mmol/L (ref 101–111)
CO2: 25 mmol/L (ref 22–32)
CREATININE: 1 mg/dL (ref 0.44–1.00)
GFR calc non Af Amer: 46 mL/min — ABNORMAL LOW (ref >60)
GFR, EST AFRICAN AMERICAN: 53 mL/min — AB (ref >60)
Glucose, Bld: 86 mg/dL (ref 65–99)
Potassium: 3.6 mmol/L (ref 3.5–5.1)
SODIUM: 138 mmol/L (ref 135–145)

## 2014-09-19 LAB — MAGNESIUM: MAGNESIUM: 2 mg/dL (ref 1.7–2.4)

## 2014-09-19 NOTE — Progress Notes (Signed)
Patient ID: [name], female   DOB: 1916-11-15, 81 y.o.   MRN: [id number] [facility] PROGRESS NOTE  TRENESE HAFT RPR:[id number] DOB: Aug 22, 1916 DOA: 09/18/2014 PCP: Henrine Screws, MD   Brief narrative:    Pt is 81 yo female who was recently hospitalized from August 15th to August 17th, 2016 for UTI, was discharged on Ciprofloxacin. In a meantime blood culture final report came back positive for E. Coli and resistant Ciprofloxacin. Pt currently reports feeling tired but no specific concerns such as fevers, chills, no chest pain or dyspnea, no abd or urinary concerns. Pt called at home to come to [facility] for repeat blood cultures and treatment.  Assessment/Plan:    Active Problems: E. Coli bacteremia - on recent blood cultures from August 15th, 2016 - continue Rocephin day #2 - repeat blood cultures pending  - follow up on urine cultures   HTN - continue home medical regimen   Hypomagnesemia - Mg supplemented and WNL  Chronic kidney disease, stage III - Cr stable and at baseline   Thrombocytopenia - stable on this hospital stay   Depression - Continue Celexa per home medical regimen   DVT prophylaxis - Lovenox SQ  Code Status: Full.  Family Communication:  plan of care discussed with the patient Disposition Plan: Home when stable.   IV access:  Peripheral IV  Procedures and diagnostic studies:    Ct Abdomen Pelvis Wo Contrast 09/12/2014   No acute findings or explanation for the patient's symptoms. 2. Extensive colonic and distal small bowel diverticulosis without evidence of acute inflammation. 3. Stable mild chronic biliary and pancreatic ductal dilatation. 4. Large hiatal hernia.   Dg Chest 2 View 09/12/2014   Mild cardiomegaly, no acute pulmonary process.     Medical Consultants:  None  Other Consultants:  None  IAnti-Infectives:   Rocephin 8/21 --.   Faye Ramsay, MD  [facility] Pager [phone]  If 7PM-7AM, please contact  night-coverage [email or web address] Password [email or web address] 09/19/2014, 6:50 AM   LOS: 1 day   HPI/Subjective: No events overnight.   Objective: Filed Vitals:   09/18/14 1257 09/18/14 2038 09/19/14 0530  BP: 134/65 122/59 131/56  Pulse: 68 59 58  Temp: 98.2 F (36.8 C) 97.8 F (36.6 C) 97.4 F (36.3 C)  TempSrc: Oral Oral Oral  Resp: 18 20 20   Height: 5\' 6"  (1.676 m)    Weight: 69.5 kg (153 lb 3.5 oz)  72.4 kg (159 lb 9.8 oz)  SpO2: 100% 98% 96%    Intake/Output Summary (Last 24 hours) at 09/19/14 0650 Last data filed at 09/18/14 2351  Gross per 24 hour  Intake 398.75 ml  Output    600 ml  Net -201.25 ml    Exam:   General:  Pt is alert, follows commands appropriately, not in acute distress  Cardiovascular: Regular rate and rhythm, S1/S2, no rubs, no gallops  Respiratory: Clear to auscultation bilaterally, no wheezing, no crackles, no rhonchi  Abdomen: Soft, non tender, non distended, bowel sounds present, no guarding  Extremities: pulses DP and PT palpable bilaterally  Neuro: Grossly nonfocal  Data Reviewed: Basic Metabolic Panel:  Recent Labs Lab 09/13/14 0544 09/14/14 0524 09/18/14 1512 09/19/14 0456  NA 139 138 139 138  K 3.4* 3.7 3.6 3.6  CL 111 109 104 106  CO2 22 23 25 25   GLUCOSE 91 94 117* 86  BUN 14 14 15 17   CREATININE 0.86 0.96 1.01* 1.00  CALCIUM 8.5* 8.6* 9.2 8.5*  MG  --   --  1.5* 2.0  PHOS  --   --  2.5  --    Liver Function Tests:  Recent Labs Lab 09/18/14 1512  AST 27  ALT 11*  ALKPHOS 51  BILITOT 0.7  PROT 6.8  ALBUMIN 3.5   CBC:  Recent Labs Lab 09/13/14 0544 09/14/14 0524 09/18/14 1512 09/19/14 0456  WBC 10.6* 8.7 8.1 8.0  HGB 10.9* 11.6* 12.9 11.8*  HCT 33.1* 34.5* 38.9 35.2*  MCV 94.0 94.8 94.4 94.4  PLT 107* 119* 163 151    Recent Results (from the past 240 hour(s))  Culture, blood (routine x 2)     Status: None   Collection Time: 09/12/14  5:05 AM  Result Value Ref Range Status   Specimen Description BLOOD  RIGHT HAND  Final   Special Requests BOTTLES DRAWN AEROBIC AND ANAEROBIC 5.5ML  Final   Culture  Setup Time   Final    GRAM NEGATIVE RODS ANAEROBIC BOTTLE ONLY CRITICAL RESULT CALLED TO, READ BACK BY AND VERIFIED WITH: E CAUDLE RN 09/14/14 @ 46 M KELLY    Culture   Final    ESCHERICHIA COLI Performed at [facility]    Report Status 09/16/2014 FINAL  Final   Organism ID, Bacteria ESCHERICHIA COLI  Final      Susceptibility   Escherichia coli - MIC*    AMPICILLIN >=32 RESISTANT Resistant     CEFAZOLIN <=4 SENSITIVE Sensitive     CEFEPIME <=1 SENSITIVE Sensitive     CEFTAZIDIME <=1 SENSITIVE Sensitive     CEFTRIAXONE <=1 SENSITIVE Sensitive     CIPROFLOXACIN >=4 RESISTANT Resistant     GENTAMICIN <=1 SENSITIVE Sensitive     IMIPENEM <=0.25 SENSITIVE Sensitive     TRIMETH/SULFA >=320 RESISTANT Resistant     AMPICILLIN/SULBACTAM >=32 RESISTANT Resistant     PIP/TAZO <=4 SENSITIVE Sensitive     * ESCHERICHIA COLI  Culture, blood (routine x 2)     Status: None   Collection Time: 09/12/14  5:13 AM  Result Value Ref Range Status   Specimen Description BLOOD LEFT ANTECUBITAL  Final   Special Requests BOTTLES DRAWN AEROBIC AND ANAEROBIC 5ML  Final   Culture   Final    NO GROWTH 5 DAYS Performed at [facility]    Report Status 09/17/2014 FINAL  Final  Urine culture     Status: None   Collection Time: 09/12/14  6:01 AM  Result Value Ref Range Status   Specimen Description URINE, CLEAN CATCH  Final   Special Requests NONE  Final   Culture   Final    MULTIPLE SPECIES PRESENT, SUGGEST RECOLLECTION Performed at [facility]    Report Status 09/13/2014 FINAL  Final     Scheduled Meds: . aspirin  81 mg Oral QODAY  . atenolol  25 mg Oral Daily  . cefTRIAXone (ROCEPHIN)  IV  2 g Intravenous Q24H  . citalopram  40 mg Oral Daily  . enoxaparin (LOVENOX) injection  40 mg Subcutaneous Daily  . estrogens (conjugated)  0.3 mg Oral Daily  . furosemide  40 mg Oral  QODAY  . isosorbide mononitrate  30 mg Oral Daily  . potassium chloride  10 mEq Oral Daily  . sodium chloride  3 mL Intravenous Q12H   Continuous Infusions: . sodium chloride 75 mL/hr at 09/18/14 2149

## 2014-09-20 ENCOUNTER — Inpatient Hospital Stay (HOSPITAL_COMMUNITY): Payer: Medicare Other

## 2014-09-20 ENCOUNTER — Encounter (HOSPITAL_COMMUNITY): Payer: Self-pay | Admitting: Radiology

## 2014-09-20 LAB — BASIC METABOLIC PANEL
Anion gap: 8 (ref 5–15)
BUN: 14 mg/dL (ref 6–20)
CALCIUM: 8.3 mg/dL — AB (ref 8.9–10.3)
CO2: 23 mmol/L (ref 22–32)
CREATININE: 0.93 mg/dL (ref 0.44–1.00)
Chloride: 109 mmol/L (ref 101–111)
GFR, EST AFRICAN AMERICAN: 58 mL/min — AB (ref >60)
GFR, EST NON AFRICAN AMERICAN: 50 mL/min — AB (ref >60)
Glucose, Bld: 79 mg/dL (ref 65–99)
Potassium: 3.7 mmol/L (ref 3.5–5.1)
SODIUM: 140 mmol/L (ref 135–145)

## 2014-09-20 LAB — CBC
HCT: 34.8 % — ABNORMAL LOW (ref 36.0–46.0)
Hemoglobin: 11.4 g/dL — ABNORMAL LOW (ref 12.0–15.0)
MCH: 31.1 pg (ref 26.0–34.0)
MCHC: 32.8 g/dL (ref 30.0–36.0)
MCV: 94.8 fL (ref 78.0–100.0)
Platelets: 147 10*3/uL — ABNORMAL LOW (ref 150–400)
RBC: 3.67 MIL/uL — ABNORMAL LOW (ref 3.87–5.11)
RDW: 14.2 % (ref 11.5–15.5)
WBC: 6.8 10*3/uL (ref 4.0–10.5)

## 2014-09-20 MED ORDER — IOHEXOL 300 MG/ML  SOLN
100.0000 mL | Freq: Once | INTRAMUSCULAR | Status: AC | PRN
  Administered 2014-09-20: 100 mL via INTRAVENOUS

## 2014-09-20 MED ORDER — CEFUROXIME AXETIL 250 MG PO TABS: 250.0000 mg | ORAL_TABLET | Freq: Two times a day (BID) | ORAL | Status: DC

## 2014-09-20 NOTE — Progress Notes (Signed)
Discharged to home with niece and nephew.  Vital signs stable.  No complaints.

## 2014-09-20 NOTE — Evaluation (Signed)
Physical Therapy One Time Evaluation Patient Details Name: [name] MRN: [id number] DOB: [birth year] Today's Date: 09/20/2014   History of Present Illness  81 yo female who was recently hospitalized from August 15th to August 17th, 2016 for UTI, was discharged on Ciprofloxacin. In a meantime blood culture final report came back positive for E. Coli and resistant Ciprofloxacin.  Clinical Impression  Patient evaluated by Physical Therapy with no further acute PT needs identified. All education has been completed and the patient has no further questions. Pt mobilizing well and appears at her baseline.  See below for any follow-up Physical Therapy or equipment needs. PT is signing off. Thank you for this referral.     Follow Up Recommendations No PT follow up    Equipment Recommendations  None recommended by PT    Recommendations for Other Services       Precautions / Restrictions Precautions Precautions: Fall Restrictions Weight Bearing Restrictions: No      Mobility  Bed Mobility Overal bed mobility: Modified Independent                Transfers Overall transfer level: Needs assistance Equipment used: None Transfers: Sit to/from Stand Sit to Stand: Supervision            Ambulation/Gait Ambulation/Gait assistance: Min guard;Supervision Ambulation Distance (Feet): 400 Feet Assistive device:  (pushed IV pole) Gait Pattern/deviations: Decreased stride length;Step-through pattern     General Gait Details: pt pushed IV pole, sometimes uses SPC in community, no unsteadiness or LOB, denies any symptoms  Stairs            Wheelchair Mobility    Modified Rankin (Stroke Patients Only)       Balance Overall balance assessment:  (reports no recent falls)                                           Pertinent Vitals/Pain      Home Living Family/patient expects to be discharged to:: Private residence Living Arrangements:  Alone   Type of Home: [place]: One level Home Equipment: Cane - single point      Prior Function Level of Independence: Independent         Comments: 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, she reports being independent with mobility and ADLSs     Hand Dominance        Extremity/Trunk Assessment               Lower Extremity Assessment: Overall WFL for tasks assessed         Communication   Communication: HOH  Cognition Arousal/Alertness: Awake/alert Behavior During Therapy: WFL for tasks assessed/performed Overall Cognitive Status: Within Functional Limits for tasks assessed                      General Comments      Exercises        Assessment/Plan    PT Assessment Patent does not need any further PT services  PT Diagnosis Difficulty walking   PT Problem List    PT Treatment Interventions     PT Goals (Current goals can be found in the Care Plan section) Acute Rehab PT Goals PT Goal Formulation: All assessment and education complete, DC therapy    Frequency     Barriers to discharge  Co-evaluation               End of Session   Activity Tolerance: Patient tolerated treatment well Patient left: in chair;with call bell/phone within reach;with chair alarm set           Time: 1010-1022 PT Time Calculation (min) (ACUTE ONLY): 12 min   Charges:   PT Evaluation $Initial PT Evaluation Tier I: 1 Procedure     PT G Codes:        Ariz Terrones,KATHrine E 09/20/2014, 12:32 PM Carmelia Bake, PT, DPT 09/20/2014 Pager: [phone]

## 2014-09-20 NOTE — Progress Notes (Signed)
EDCM received phone call from [place] requesting [facility] come to speak to patient regarding home health services.  EDCM  Spoke to patient and family at bedside.  Patient lives at home alone, but has family support.  Patient has a private aide who comes to patient's home every day from 9am to 1pm who assists patient with ADL's, takes patient shopping and to her doctor's appointments etc.  Patient is requesting someone to come into her home at night and assist her in getting ready for bed.  EDCM reviewed home health services vs. Private duty services.  Patient is requesting private duty services per our conversation.  Patient is not in need of home health services at this time.  [facility] provided patient with list of private duty nursing agency lists.  Patient anf family thankful for services.  No further EDCM needs at this time.  EDCM notified unit RN Raquel Sarna of above.

## 2014-09-20 NOTE — Care Management Note (Signed)
Case Management Note  Patient Details  Name: YING BLANKENHORN MRN: [id number] Date of Birth: 1916/07/23  Subjective/Objective:          81 yo admitted with acute renal failure          Action/Plan: From home alone  Expected Discharge Date:                  Expected Discharge Plan:  Home/Self Care  In-House Referral:     Discharge planning Services  CM Consult  Post Acute Care Choice:    Choice offered to:     DME Arranged:    DME Agency:     HH Arranged:    [place] Agency:     Status of Service:  Completed, signed off  Medicare Important Message Given:  Yes-second notification given Date Medicare IM Given:    Medicare IM give by:    Date Additional Medicare IM Given:    Additional Medicare Important Message give by:     If discussed at [place] of Stay Meetings, dates discussed:    Additional Comments: PT evaluation did not show that pt needs HH services or additional home equipment. No other DC needs noted. Lynnell Catalan, RN 09/20/2014, 2:35 PM

## 2014-09-20 NOTE — Care Management Important Message (Signed)
Important Message  Patient Details  Name: HALLEY SHEPHEARD MRN: [id number] Date of Birth: 04-05-1916   Medicare Important Message Given:  [facility] notification given    Camillo Flaming 09/20/2014, 12:16 [place] Message  Patient Details  Name: KYNDALL AMERO MRN: [id number] Date of Birth: 07/04/1916   Medicare Important Message Given:  Yes-second notification given    Camillo Flaming 09/20/2014, 12:16 PM

## 2014-09-21 LAB — URINE CULTURE

## 2014-09-21 NOTE — Discharge Summary (Signed)
Physician Discharge Summary  [name]:[id number] DOB: March 28, 1916 DOA: 09/18/2014  PCP: Henrine Screws, MD  Admit date: 09/18/2014 Discharge date: 09/20/2014  Recommendations for Outpatient Follow-up:  1. Pt will need to follow up with PCP in 2-3 weeks post discharge 2. Please obtain BMP to evaluate electrolytes and kidney function 3. Please also check CBC to evaluate Hg and Hct levels 4. Please note that patient was discharged on Ceftin to complete therapy for Escherichia coli bacteremia, this was recommended by ID specialist Dr. Linus Salmons 5. Please also note that patient has complained of pain in right submandibular area, CT neck results outlined below 6. Discussed with ENT specialist on call, outpatient follow-up recommended 7. I have called [facility] ENT to have patient see a specialist in 1 week for further evaluation. Secretary explained they have to get in touch with scheduling department and will call patient back in next 24 hours for the appointment time and date 8. I have provided patient with my personal phone number to call me if she is having any problems with scheduling or if she does not hear from [facility] office.  Discharge Diagnoses:  Active Problems:   Acute renal failure   Bacteremia  Discharge Condition: Stable  Diet recommendation: Heart healthy diet discussed in details   History of present illness:   Brief narrative:    Pt is 81 yo female who was recently hospitalized from August 15th to August 17th, 2016 for UTI, was discharged on Ciprofloxacin. In a meantime blood culture final report came back positive for E. Coli and resistant Ciprofloxacin. Pt currently reports feeling tired but no specific concerns such as fevers, chills, no chest pain or dyspnea, no abd or urinary concerns. Pt called at home to come to [facility] for repeat blood cultures and treatment.  Assessment/Plan:    Active Problems: E. Coli bacteremia - on recent blood cultures  from August 15th, 2016 - continue Rocephin day #3, changed to Ceftin on discharge as patient insisting on going home today - repeat blood cultures pending and patient made aware  HTN - continue home medical regimen   Hypomagnesemia - Mg supplemented and WNL  Chronic kidney disease, stage III - Cr stable and at baseline   Right submandibular pain - Please note CT neck findings below - This was discussed with patient in detail and her family - Outpatient follow-up with [facility] ENT recommended  Thrombocytopenia - stable on this hospital stay   Depression - Continue Celexa per home medical regimen   DVT prophylaxis - Lovenox SQ  Code Status: Full.  Family Communication: plan of care discussed with the patient and family at bedside Disposition Plan: Home  IV access:  Peripheral IV  Procedures and diagnostic studies:   Ct Abdomen Pelvis Wo Contrast 09/12/2014 No acute findings or explanation for the patient's symptoms. 2. Extensive colonic and distal small bowel diverticulosis without evidence of acute inflammation. 3. Stable mild chronic biliary and pancreatic ductal dilatation. 4. Large hiatal hernia.   Dg Chest 2 View 09/12/2014 Mild cardiomegaly, no acute pulmonary process.   Ct Soft Tissue Neck W Contrast 09/20/2014  No explanation for left facial swelling. 2. 9 mm mass in the right submandibular gland is new from 2013, favored to reflect mucocele or focal duct ectasia given apparent communication with the submandibular duct. Neoplasm cannot be excluded and clinical or CT follow-up (in ~6 months) could confirm stability.     Medical Consultants:  None  Other Consultants:  None  IAnti-Infectives:  Rocephin 8/21 --> changed to Ceftin upon discharge       Discharge Exam: Filed Vitals:   09/20/14 1445  BP: 110/60  Pulse: 58  Temp: 97.6 F (36.4 C)  Resp: 20   Filed Vitals:   09/19/14 2041 09/20/14 0539 09/20/14 0949 09/20/14 1445   BP: 123/97 154/64 116/78 110/60  Pulse: 63 59 75 58  Temp: 98 F (36.7 C) 98.3 F (36.8 C)  97.6 F (36.4 C)  TempSrc: Oral Oral  Oral  Resp: 18 18  20   Height:      Weight:  73.8 kg (162 lb 11.2 oz)    SpO2: 94% 97%  98%    General: Pt is alert, follows commands appropriately, not in acute distress Cardiovascular: Regular rate and rhythm, S1/S2 +,  no rubs, no gallops Respiratory: Clear to auscultation bilaterally, no wheezing, no crackles, no rhonchi Abdominal: Soft, non tender, non distended, bowel sounds +, no guarding Extremities: no cyanosis, pulses palpable bilaterally DP and PT  Discharge Instructions  Discharge Instructions    Diet - low sodium heart healthy    Complete by:  As directed      Increase activity slowly    Complete by:  As directed             Medication List    STOP taking these medications        ciprofloxacin 500 MG tablet  Commonly known as:  CIPRO      TAKE these medications        acetaminophen 500 MG tablet  Commonly known as:  TYLENOL  Take 1,000 mg by mouth every 6 (six) hours as needed for moderate pain.     AF-LOPERAMIDE HCL 2 MG tablet  Generic drug:  loperamide  Take 2 mg by mouth as needed for diarrhea or loose stools. 1 capsule up to 8 tablets per 24hour 8 times a day     aspirin EC 81 MG tablet  Take 81 mg by mouth every other day.     atenolol 25 MG tablet  Commonly known as:  TENORMIN  Take 25 mg by mouth every morning.     cefUROXime 250 MG tablet  Commonly known as:  CEFTIN  Take 1 tablet (250 mg total) by mouth 2 (two) times daily with a meal.     citalopram 40 MG tablet  Commonly known as:  CELEXA  Take 40 mg by mouth every morning.     clotrimazole 1 % cream  Commonly known as:  LOTRIMIN  Apply 1 application topically as needed (foot -- infection/rash).     estrogens (conjugated) 0.3 MG tablet  Commonly known as:  PREMARIN  Take 0.3 mg by mouth every morning.     furosemide 40 MG tablet  Commonly  known as:  LASIX  Take 40 mg by mouth every other day.     isosorbide mononitrate 30 MG 24 hr tablet  Commonly known as:  IMDUR  Take 1 tablet (30 mg total) by mouth daily.     ketoconazole 2 % cream  Commonly known as:  NIZORAL  Apply 1 application topically 2 (two) times daily as needed for irritation.     meclizine 25 MG tablet  Commonly known as:  ANTIVERT  Take 25 mg by mouth 2 (two) times daily as needed for dizziness or nausea.     potassium chloride 10 MEQ CR tablet  Commonly known as:  KLOR-CON  Take 10 mEq by mouth 3 (three) times daily.  PRENATAL PO  Take 1 tablet by mouth daily.     REFRESH OP  Apply 1 drop to eye 2 (two) times daily.     triamcinolone cream 0.1 %  Commonly known as:  KENALOG  Apply 1 application topically 2 (two) times daily as needed (rash).           Follow-up Information    Follow up with Henrine Screws, MD On 09/29/2014.   Specialty:  Internal Medicine   Why:  at    For Post Hospitalization Follow Up  @ 11:45 a.m.   Contact information:   [address] [place] [address] [phone]       Call Faye Ramsay, MD.   Specialty:  Internal Medicine   Why:  As needed call my cell phone [phone]   Contact information:   [address] [place] [address] [phone]        The results of significant diagnostics from this hospitalization (including imaging, microbiology, ancillary and laboratory) are listed below for reference.     Microbiology: Recent Results (from the past 240 hour(s))  Culture, blood (routine x 2)     Status: None   Collection Time: 09/12/14  5:05 AM  Result Value Ref Range Status   Specimen Description BLOOD RIGHT HAND  Final   Special Requests BOTTLES DRAWN AEROBIC AND ANAEROBIC 5.5ML  Final   Culture  Setup Time   Final    GRAM NEGATIVE RODS ANAEROBIC BOTTLE ONLY CRITICAL RESULT CALLED TO, READ BACK BY AND VERIFIED WITH: E CAUDLE RN 09/14/14 @  102 M KELLY    Culture   Final    ESCHERICHIA COLI Performed at [facility]    Report Status 09/16/2014 FINAL  Final   Organism ID, Bacteria ESCHERICHIA COLI  Final      Susceptibility   Escherichia coli - MIC*    AMPICILLIN >=32 RESISTANT Resistant     CEFAZOLIN <=4 SENSITIVE Sensitive     CEFEPIME <=1 SENSITIVE Sensitive     CEFTAZIDIME <=1 SENSITIVE Sensitive     CEFTRIAXONE <=1 SENSITIVE Sensitive     CIPROFLOXACIN >=4 RESISTANT Resistant     GENTAMICIN <=1 SENSITIVE Sensitive     IMIPENEM <=0.25 SENSITIVE Sensitive     TRIMETH/SULFA >=320 RESISTANT Resistant     AMPICILLIN/SULBACTAM >=32 RESISTANT Resistant     PIP/TAZO <=4 SENSITIVE Sensitive     * ESCHERICHIA COLI  Culture, blood (routine x 2)     Status: None   Collection Time: 09/12/14  5:13 AM  Result Value Ref Range Status   Specimen Description BLOOD LEFT ANTECUBITAL  Final   Special Requests BOTTLES DRAWN AEROBIC AND ANAEROBIC 5ML  Final   Culture   Final    NO GROWTH 5 DAYS Performed at [facility]    Report Status 09/17/2014 FINAL  Final  Urine culture     Status: None   Collection Time: 09/12/14  6:01 AM  Result Value Ref Range Status   Specimen Description URINE, CLEAN CATCH  Final   Special Requests NONE  Final   Culture   Final    MULTIPLE SPECIES PRESENT, SUGGEST RECOLLECTION Performed at [facility]    Report Status 09/13/2014 FINAL  Final  Culture, blood (routine x 2)     Status: None (Preliminary result)   Collection Time: 09/18/14  1:30 PM  Result Value Ref Range Status   Specimen Description BLOOD RIGHT ARM  Final   Special  Requests IN PEDIATRIC BOTTLE [place]  Final   Culture   Final    NO GROWTH 2 DAYS Performed at [facility]    Report Status PENDING  Incomplete  Culture, blood (routine x 2)     Status: None (Preliminary result)   Collection Time: 09/18/14  1:41 PM  Result Value Ref Range Status   Specimen Description BLOOD RIGHT ARM  Final   Special  Requests IN PEDIATRIC BOTTLE 4CC  Final   Culture   Final    NO GROWTH 2 DAYS Performed at [facility]    Report Status PENDING  Incomplete  Urine culture     Status: None   Collection Time: 09/18/14  4:00 PM  Result Value Ref Range Status   Specimen Description URINE, CLEAN CATCH  Final   Special Requests NONE  Final   Culture   Final    20,000 COLONIES/mL ESCHERICHIA COLI Performed at [facility]    Report Status 09/21/2014 FINAL  Final   Organism ID, Bacteria ESCHERICHIA COLI  Final      Susceptibility   Escherichia coli - MIC*    AMPICILLIN >=32 RESISTANT Resistant     CEFAZOLIN <=4 SENSITIVE Sensitive     CEFTRIAXONE <=1 SENSITIVE Sensitive     CIPROFLOXACIN >=4 RESISTANT Resistant     GENTAMICIN <=1 SENSITIVE Sensitive     IMIPENEM <=0.25 SENSITIVE Sensitive     NITROFURANTOIN <=16 SENSITIVE Sensitive     TRIMETH/SULFA >=320 RESISTANT Resistant     AMPICILLIN/SULBACTAM >=32 RESISTANT Resistant     PIP/TAZO <=4 SENSITIVE Sensitive     * 20,000 COLONIES/mL ESCHERICHIA COLI     Labs: Basic Metabolic Panel:  Recent Labs Lab 09/18/14 1512 09/19/14 0456 09/20/14 0514  NA 139 138 140  K 3.6 3.6 3.7  CL 104 106 109  CO2 25 25 23   GLUCOSE 117* 86 79  BUN 15 17 14   CREATININE 1.01* 1.00 0.93  CALCIUM 9.2 8.5* 8.3*  MG 1.5* 2.0  --   PHOS 2.5  --   --    Liver Function Tests:  Recent Labs Lab 09/18/14 1512  AST 27  ALT 11*  ALKPHOS 51  BILITOT 0.7  PROT 6.8  ALBUMIN 3.5   No results for input(s): LIPASE, AMYLASE in the last 168 hours. No results for input(s): AMMONIA in the last 168 hours. CBC:  Recent Labs Lab 09/18/14 1512 09/19/14 0456 09/20/14 0514  WBC 8.1 8.0 6.8  HGB 12.9 11.8* 11.4*  HCT 38.9 35.2* 34.8*  MCV 94.4 94.4 94.8  PLT 163 151 147*   Cardiac Enzymes: No results for input(s): CKTOTAL, CKMB, CKMBINDEX, TROPONINI in the last 168 hours. BNP: BNP (last 3 results) No results for input(s): BNP in the last 8760  hours.  ProBNP (last 3 results) No results for input(s): PROBNP in the last 8760 hours.  CBG: No results for input(s): GLUCAP in the last 168 hours.   SIGNED: Time coordinating discharge: Over 30 minutes  Faye Ramsay, MD  [facility] 09/21/2014, 11:18 AM Pager [phone]  If 7PM-7AM, please contact night-coverage [email or web address] Password [email or web address]

## 2014-09-23 DIAGNOSIS — K116 Mucocele of salivary gland: Secondary | ICD-10-CM | POA: Diagnosis not present

## 2014-09-23 LAB — CULTURE, BLOOD (ROUTINE X 2)
CULTURE: NO GROWTH
Culture: NO GROWTH

## 2014-09-29 DIAGNOSIS — L84 Corns and callosities: Secondary | ICD-10-CM | POA: Diagnosis not present

## 2014-09-29 DIAGNOSIS — D72819 Decreased white blood cell count, unspecified: Secondary | ICD-10-CM | POA: Diagnosis not present

## 2014-09-29 DIAGNOSIS — A4151 Sepsis due to Escherichia coli [E. coli]: Secondary | ICD-10-CM | POA: Diagnosis not present

## 2014-09-29 DIAGNOSIS — F329 Major depressive disorder, single episode, unspecified: Secondary | ICD-10-CM | POA: Diagnosis not present

## 2014-09-29 DIAGNOSIS — N179 Acute kidney failure, unspecified: Secondary | ICD-10-CM | POA: Diagnosis not present

## 2014-10-06 DIAGNOSIS — Z961 Presence of intraocular lens: Secondary | ICD-10-CM | POA: Diagnosis not present

## 2014-10-06 DIAGNOSIS — I10 Essential (primary) hypertension: Secondary | ICD-10-CM | POA: Diagnosis not present

## 2014-10-06 DIAGNOSIS — H04123 Dry eye syndrome of bilateral lacrimal glands: Secondary | ICD-10-CM | POA: Diagnosis not present

## 2014-10-06 DIAGNOSIS — H524 Presbyopia: Secondary | ICD-10-CM | POA: Diagnosis not present

## 2014-10-06 DIAGNOSIS — H5203 Hypermetropia, bilateral: Secondary | ICD-10-CM | POA: Diagnosis not present

## 2014-10-06 DIAGNOSIS — H35031 Hypertensive retinopathy, right eye: Secondary | ICD-10-CM | POA: Diagnosis not present

## 2014-10-06 DIAGNOSIS — H52223 Regular astigmatism, bilateral: Secondary | ICD-10-CM | POA: Diagnosis not present

## 2014-10-10 DIAGNOSIS — I739 Peripheral vascular disease, unspecified: Secondary | ICD-10-CM | POA: Diagnosis not present

## 2014-10-10 DIAGNOSIS — L84 Corns and callosities: Secondary | ICD-10-CM | POA: Diagnosis not present

## 2014-10-10 DIAGNOSIS — M79609 Pain in unspecified limb: Secondary | ICD-10-CM | POA: Diagnosis not present

## 2014-10-10 DIAGNOSIS — L603 Nail dystrophy: Secondary | ICD-10-CM | POA: Diagnosis not present

## 2014-10-10 DIAGNOSIS — L97519 Non-pressure chronic ulcer of other part of right foot with unspecified severity: Secondary | ICD-10-CM | POA: Diagnosis not present

## 2014-11-09 DIAGNOSIS — Z23 Encounter for immunization: Secondary | ICD-10-CM | POA: Diagnosis not present

## 2014-12-01 DIAGNOSIS — M2041 Other hammer toe(s) (acquired), right foot: Secondary | ICD-10-CM | POA: Diagnosis not present

## 2014-12-01 DIAGNOSIS — L97519 Non-pressure chronic ulcer of other part of right foot with unspecified severity: Secondary | ICD-10-CM | POA: Diagnosis not present

## 2014-12-01 DIAGNOSIS — M2011 Hallux valgus (acquired), right foot: Secondary | ICD-10-CM | POA: Diagnosis not present

## 2014-12-26 ENCOUNTER — Telehealth: Payer: Self-pay | Admitting: Interventional Cardiology

## 2014-12-26 DIAGNOSIS — I10 Essential (primary) hypertension: Secondary | ICD-10-CM

## 2014-12-26 DIAGNOSIS — R0602 Shortness of breath: Secondary | ICD-10-CM

## 2014-12-26 DIAGNOSIS — K625 Hemorrhage of anus and rectum: Secondary | ICD-10-CM

## 2014-12-26 NOTE — Telephone Encounter (Addendum)
The pt c/o sob with little exertion X 1 week. She states that she has to sit down to catch her breath when she walks a short distance to her kitchen. She does not know if she has had a weight gain as she does not weigh herself daily. She is advised to check her weight when she wakes up in the am (after she urinates) and to keep a list of her daily weights so that we will have a better idea of her fluid gain in the event that she becomes SOB or if she develops swelling. She verbalized understanding.   The pt takes Furosemide 40 mg every other day.  She is advised that Dr Irish Lack is not in the office at this time so I am going to discuss with our DOD, Dr Harrington Challenger.

## 2014-12-26 NOTE — Telephone Encounter (Signed)
Pt c/o Shortness Of Breath: STAT if SOB developed within the last 24 hours or pt is noticeably SOB on the phone  1. Are you currently SOB (can you hear that pt is SOB on the phone)? No  2. How long have you been experiencing SOB? Over the weekend  3. Are you SOB when sitting or when up moving around? Both//off and on  4. Are you currently experiencing any other symptoms? no

## 2014-12-26 NOTE — Telephone Encounter (Signed)
Per Dr Harrington Challenger the pt is advised to take an extra Furosemide dose tomorrow and to come to the office for a CBCD, BNP and BMET soon. The pt verbalized understanding and states that she will come to the office tomorrow morning for her lab work.  Labs ordered and schduled to be drawn tomorrow.

## 2014-12-27 ENCOUNTER — Ambulatory Visit (INDEPENDENT_AMBULATORY_CARE_PROVIDER_SITE_OTHER): Payer: Medicare Other | Admitting: Interventional Cardiology

## 2014-12-27 ENCOUNTER — Encounter: Payer: Self-pay | Admitting: Interventional Cardiology

## 2014-12-27 ENCOUNTER — Other Ambulatory Visit (INDEPENDENT_AMBULATORY_CARE_PROVIDER_SITE_OTHER): Payer: Medicare Other

## 2014-12-27 VITALS — BP 140/50 | HR 80 | Ht 66.0 in | Wt 152.0 lb

## 2014-12-27 DIAGNOSIS — R0609 Other forms of dyspnea: Secondary | ICD-10-CM

## 2014-12-27 DIAGNOSIS — R9439 Abnormal result of other cardiovascular function study: Secondary | ICD-10-CM | POA: Diagnosis not present

## 2014-12-27 DIAGNOSIS — R0602 Shortness of breath: Secondary | ICD-10-CM | POA: Diagnosis not present

## 2014-12-27 DIAGNOSIS — R079 Chest pain, unspecified: Secondary | ICD-10-CM | POA: Diagnosis not present

## 2014-12-27 DIAGNOSIS — K625 Hemorrhage of anus and rectum: Secondary | ICD-10-CM

## 2014-12-27 DIAGNOSIS — I10 Essential (primary) hypertension: Secondary | ICD-10-CM

## 2014-12-27 DIAGNOSIS — I159 Secondary hypertension, unspecified: Secondary | ICD-10-CM | POA: Diagnosis not present

## 2014-12-27 LAB — BRAIN NATRIURETIC PEPTIDE: BRAIN NATRIURETIC PEPTIDE: 142 pg/mL — AB (ref 0.0–100.0)

## 2014-12-27 LAB — BASIC METABOLIC PANEL
BUN: 17 mg/dL (ref 7–25)
CHLORIDE: 102 mmol/L (ref 98–110)
CO2: 26 mmol/L (ref 20–31)
CREATININE: 1.14 mg/dL — AB (ref 0.60–0.88)
Calcium: 9.4 mg/dL (ref 8.6–10.4)
Glucose, Bld: 83 mg/dL (ref 65–99)
POTASSIUM: 3.5 mmol/L (ref 3.5–5.3)
SODIUM: 138 mmol/L (ref 135–146)

## 2014-12-27 LAB — CBC WITH DIFFERENTIAL/PLATELET
BASOS ABS: 0 10*3/uL (ref 0.0–0.1)
Basophils Relative: 0 % (ref 0–1)
Eosinophils Absolute: 0 10*3/uL (ref 0.0–0.7)
Eosinophils Relative: 0 % (ref 0–5)
HEMATOCRIT: 38.6 % (ref 36.0–46.0)
HEMOGLOBIN: 13.1 g/dL (ref 12.0–15.0)
LYMPHS ABS: 2.2 10*3/uL (ref 0.7–4.0)
LYMPHS PCT: 34 % (ref 12–46)
MCH: 31.5 pg (ref 26.0–34.0)
MCHC: 33.9 g/dL (ref 30.0–36.0)
MCV: 92.8 fL (ref 78.0–100.0)
MONOS PCT: 15 % — AB (ref 3–12)
MPV: 10.2 fL (ref 8.6–12.4)
Monocytes Absolute: 1 10*3/uL (ref 0.1–1.0)
NEUTROS ABS: 3.3 10*3/uL (ref 1.7–7.7)
NEUTROS PCT: 51 % (ref 43–77)
PLATELETS: 166 10*3/uL (ref 150–400)
RBC: 4.16 MIL/uL (ref 3.87–5.11)
RDW: 13.7 % (ref 11.5–15.5)
WBC: 6.4 10*3/uL (ref 4.0–10.5)

## 2014-12-27 NOTE — Patient Instructions (Signed)
**Note De-Identified  Obfuscation** Medication Instructions:  Same-no changes  Labwork: None  Testing/Procedures: None  Follow-Up: Your physician recommends that you schedule a follow-up appointment in: as already planned.      If you need a refill on your cardiac medications before your next appointment, please call your pharmacy.

## 2014-12-27 NOTE — Progress Notes (Signed)
Patient ID: [name], female   DOB: Nov 11, 1916, 81 y.o.   MRN: [id number]     Cardiology Office Note   Date:  12/27/2014   ID:  [name], DOB 1916-08-31, MRN [id number]  PCP:  Henrine Screws, MD    No chief complaint on file. SHOB   Wt Readings from Last 3 Encounters:  12/27/14 152 lb (68.947 kg)  09/20/14 162 lb 11.2 oz (73.8 kg)  09/12/14 150 lb (68.04 kg)       History of Present Illness: [name] is a 81 y.o. female  who had an abnormal stress test several years ago.  She has been medically managed for this. SHe has DOE when she walks fast. Walking is limited by back and foot pain. No chest pain with activity.   She was hospitalized in August 2016 for UTI which led to Escherichia coli bacteremia. Since that time, she does not feel that she has returned to normal. She denies any lower extremity swelling. No change in her weight. She is not feel like she is retaining fluid. She just feels like she is more short of breath than usual. She sleeps on one pillow. She is not having to sit up to catch her breath. She had lab work earlier today. We worked her in to see her due to her symptoms of shortness of breath.  She admits to being nervous more of the time.    Past Medical History  Diagnosis Date  . Hyperglycemia   . CAD (coronary artery disease)   . Pneumonia     july 2009  . Hyperlipidemia   . Hormone replacement therapy (postmenopausal)   . Diverticulosis   . DJD (degenerative joint disease) of lumbar spine     scoliosis  . DJD (degenerative joint disease) of knee   . Sciatica     right lower extremity  . Allergic rhinitis   . Reactive depression (situational)   . Shingles     right thoracic 2008  . Eczema   . GI bleed   . Diverticular disease   . Esophageal erosions     from nsaid   . Cough   . Orthopnea   . Vertigo   . CHF (congestive heart failure) ([place])   . Hypertension     Past Surgical History  Procedure  Laterality Date  . Back surgery    . Total abdominal hysterectomy    . Breast lumpectomy    . Appendectomy    . Flexible sigmoidoscopy Left 08/13/2013    Procedure: FLEXIBLE SIGMOIDOSCOPY;  Surgeon: Arta Silence, MD;  Location: [facility] ENDOS[facility];  Service: Endoscopy;  Laterality: Left;     Current Outpatient Prescriptions  Medication Sig Dispense Refill  . acetaminophen (TYLENOL) 500 MG tablet Take 1,000 mg by mouth every 6 (six) hours as needed for moderate pain.    Marland Kitchen aspirin EC 81 MG tablet Take 81 mg by mouth every other day.    Marland Kitchen atenolol (TENORMIN) 25 MG tablet Take 25 mg by mouth every morning.     . citalopram (CELEXA) 40 MG tablet Take 40 mg by mouth every morning.     . clotrimazole (LOTRIMIN) 1 % cream Apply 1 application topically as needed (foot -- infection/rash).     Marland Kitchen estrogens, conjugated, (PREMARIN) 0.3 MG tablet Take 0.3 mg by mouth every morning.     . furosemide (LASIX) 40 MG tablet Take 40 mg by mouth every other day.     . isosorbide mononitrate (  IMDUR) 30 MG 24 hr tablet Take 1 tablet (30 mg total) by mouth daily. 90 tablet 3  . ketoconazole (NIZORAL) 2 % cream Apply 1 application topically 2 (two) times daily as needed for irritation.    Marland Kitchen loperamide (AF-LOPERAMIDE HCL) 2 MG tablet Take 2 mg by mouth as needed for diarrhea or loose stools. 1 capsule up to 8 tablets per 24hour 8 times a day    . meclizine (ANTIVERT) 25 MG tablet Take 25 mg by mouth 2 (two) times daily as needed for dizziness or nausea.     . Polyvinyl Alcohol-Povidone (REFRESH OP) Apply 1 drop to eye 2 (two) times daily.    . potassium chloride (KLOR-CON) 10 MEQ CR tablet Take 10 mEq by mouth 3 (three) times daily.     . Prenatal Vit-Fe Fumarate-FA (PRENATAL PO) Take 1 tablet by mouth daily.    Marland Kitchen triamcinolone cream (KENALOG) 0.1 % Apply 1 application topically 2 (two) times daily as needed (rash).      No current facility-administered medications for this visit.    Allergies:   Ace inhibitors;  Penicillins; Streptomycin; and Tramadol    Social History:  The patient  reports that she has never smoked. She has never used smokeless tobacco. She reports that she drinks alcohol. She reports that she does not use illicit drugs.   Family History:  The patient's family history includes Cancer in her brother; Heart attack in her brother, father, mother, and sister; Heart disease in her daughter, father, mother, sister, and son; Hypertension in her brother. There is no history of Stroke.    ROS:  Please see the history of present illness.   Otherwise, review of systems are positive for shortness of breath as noted above.   All other systems are reviewed and negative.    PHYSICAL EXAM: VS:  BP 140/50 mmHg  Pulse 80  Ht 5\' 6"  (1.676 m)  Wt 152 lb (68.947 kg)  BMI 24.55 kg/m2 , BMI Body mass index is 24.55 kg/(m^2). GEN: Well nourished, well developed, in no acute distress HEENT: normal Neck: no JVD, carotid bruits, or masses Cardiac: Premature beats, otherwise RRR; no murmurs, rubs, or gallops, minimal bilateral ankle edema  Respiratory:  clear to auscultation bilaterally, normal work of breathing GI: soft, nontender, nondistended, + BS MS: no deformity or atrophy Skin: warm and dry, no rash Neuro:  Strength and sensation are intact Psych: euthymic mood, full affect   EKG:   The ekg ordered today demonstrates normal sinus rhythm, PVCs   Recent Labs: 09/18/2014: ALT 11* 09/19/2014: Magnesium 2.0 09/20/2014: BUN 14; Creatinine, Ser 0.93; Hemoglobin 11.4*; Platelets 147*; Potassium 3.7; Sodium 140   Lipid Panel No results found for: CHOL, TRIG, HDL, CHOLHDL, VLDL, LDLCALC, LDLDIRECT   Other studies Reviewed: Additional studies/ records that were reviewed today with results demonstrating: prior stress test result as noted.   ASSESSMENT AND PLAN:  1.  Abnormal stress test: Lateral defect noted several years ago. She has had her angina controlled medically. She has had normal left  ventricular function. I don't think her current symptoms are ischemia related. No changes to her ECG. 2. Shortness of breath: Likely multifactorial. Labs pending. May be related to deconditioning. She was hospitalized and has never quite fully gotten back to full activity. This will be difficult given her age.  I don't think she is in florid heart failure.   3. Hypertension: Continue current blood pressure lowering therapy. Blood pressure fairly well controlled today. 4. Depending on  labs, may need to give an additional dose of Lasix. By physical exam, she does not appear grossly volume overloaded.  Will call er with todays lab results.   Current medicines are reviewed at length with the patient today.  The patient concerns regarding her medicines were addressed.  The following changes have been made:  No change  Labs/ tests ordered today include:  No orders of the defined types were placed in this encounter.    Recommend 150 minutes/week of aerobic exercise Low fat, low carb, high fiber diet recommended  Disposition:   FU as scheduled   Teresita Madura., MD  12/27/2014 9:45 AM    [address], [place], [place]  [address] Phone: [phone]; Fax: [phone]

## 2015-01-03 DIAGNOSIS — L603 Nail dystrophy: Secondary | ICD-10-CM | POA: Diagnosis not present

## 2015-01-03 DIAGNOSIS — L84 Corns and callosities: Secondary | ICD-10-CM | POA: Diagnosis not present

## 2015-01-03 DIAGNOSIS — I739 Peripheral vascular disease, unspecified: Secondary | ICD-10-CM | POA: Diagnosis not present

## 2015-01-03 DIAGNOSIS — B351 Tinea unguium: Secondary | ICD-10-CM | POA: Diagnosis not present

## 2015-01-03 DIAGNOSIS — M2011 Hallux valgus (acquired), right foot: Secondary | ICD-10-CM | POA: Diagnosis not present

## 2015-01-03 DIAGNOSIS — M79609 Pain in unspecified limb: Secondary | ICD-10-CM | POA: Diagnosis not present

## 2015-01-24 DIAGNOSIS — K648 Other hemorrhoids: Secondary | ICD-10-CM | POA: Diagnosis not present

## 2015-02-01 DIAGNOSIS — I25111 Atherosclerotic heart disease of native coronary artery with angina pectoris with documented spasm: Secondary | ICD-10-CM | POA: Diagnosis not present

## 2015-02-01 DIAGNOSIS — K219 Gastro-esophageal reflux disease without esophagitis: Secondary | ICD-10-CM | POA: Diagnosis not present

## 2015-02-01 DIAGNOSIS — I10 Essential (primary) hypertension: Secondary | ICD-10-CM | POA: Diagnosis not present

## 2015-02-01 DIAGNOSIS — B353 Tinea pedis: Secondary | ICD-10-CM | POA: Diagnosis not present

## 2015-02-01 DIAGNOSIS — J309 Allergic rhinitis, unspecified: Secondary | ICD-10-CM | POA: Diagnosis not present

## 2015-02-01 DIAGNOSIS — F329 Major depressive disorder, single episode, unspecified: Secondary | ICD-10-CM | POA: Diagnosis not present

## 2015-02-01 DIAGNOSIS — G609 Hereditary and idiopathic neuropathy, unspecified: Secondary | ICD-10-CM | POA: Diagnosis not present

## 2015-02-01 DIAGNOSIS — E7800 Pure hypercholesterolemia, unspecified: Secondary | ICD-10-CM | POA: Diagnosis not present

## 2015-02-09 DIAGNOSIS — W19XXXS Unspecified fall, sequela: Secondary | ICD-10-CM | POA: Diagnosis not present

## 2015-02-09 DIAGNOSIS — M5416 Radiculopathy, lumbar region: Secondary | ICD-10-CM | POA: Diagnosis not present

## 2015-02-14 DIAGNOSIS — W19XXXS Unspecified fall, sequela: Secondary | ICD-10-CM | POA: Diagnosis not present

## 2015-02-14 DIAGNOSIS — M5416 Radiculopathy, lumbar region: Secondary | ICD-10-CM | POA: Diagnosis not present

## 2015-02-24 DIAGNOSIS — B029 Zoster without complications: Secondary | ICD-10-CM | POA: Diagnosis not present

## 2015-03-14 DIAGNOSIS — L84 Corns and callosities: Secondary | ICD-10-CM | POA: Diagnosis not present

## 2015-03-14 DIAGNOSIS — I739 Peripheral vascular disease, unspecified: Secondary | ICD-10-CM | POA: Diagnosis not present

## 2015-03-14 DIAGNOSIS — M205X1 Other deformities of toe(s) (acquired), right foot: Secondary | ICD-10-CM | POA: Diagnosis not present

## 2015-03-14 DIAGNOSIS — L603 Nail dystrophy: Secondary | ICD-10-CM | POA: Diagnosis not present

## 2015-03-14 DIAGNOSIS — L97519 Non-pressure chronic ulcer of other part of right foot with unspecified severity: Secondary | ICD-10-CM | POA: Diagnosis not present

## 2015-03-22 DIAGNOSIS — W19XXXS Unspecified fall, sequela: Secondary | ICD-10-CM | POA: Diagnosis not present

## 2015-03-22 DIAGNOSIS — M5416 Radiculopathy, lumbar region: Secondary | ICD-10-CM | POA: Diagnosis not present

## 2015-03-27 DIAGNOSIS — W19XXXS Unspecified fall, sequela: Secondary | ICD-10-CM | POA: Diagnosis not present

## 2015-03-27 DIAGNOSIS — M5416 Radiculopathy, lumbar region: Secondary | ICD-10-CM | POA: Diagnosis not present

## 2015-03-29 DIAGNOSIS — W19XXXS Unspecified fall, sequela: Secondary | ICD-10-CM | POA: Diagnosis not present

## 2015-03-29 DIAGNOSIS — M5416 Radiculopathy, lumbar region: Secondary | ICD-10-CM | POA: Diagnosis not present

## 2015-04-05 DIAGNOSIS — M4806 Spinal stenosis, lumbar region: Secondary | ICD-10-CM | POA: Diagnosis not present

## 2015-04-18 DIAGNOSIS — X19XXXS Contact with other heat and hot substances, sequela: Secondary | ICD-10-CM | POA: Diagnosis not present

## 2015-04-18 DIAGNOSIS — M5416 Radiculopathy, lumbar region: Secondary | ICD-10-CM | POA: Diagnosis not present

## 2015-05-08 DIAGNOSIS — L97512 Non-pressure chronic ulcer of other part of right foot with fat layer exposed: Secondary | ICD-10-CM | POA: Diagnosis not present

## 2015-05-08 DIAGNOSIS — M2041 Other hammer toe(s) (acquired), right foot: Secondary | ICD-10-CM | POA: Diagnosis not present

## 2015-05-08 DIAGNOSIS — M2042 Other hammer toe(s) (acquired), left foot: Secondary | ICD-10-CM | POA: Diagnosis not present

## 2015-05-22 DIAGNOSIS — L97519 Non-pressure chronic ulcer of other part of right foot with unspecified severity: Secondary | ICD-10-CM | POA: Diagnosis not present

## 2015-05-30 DIAGNOSIS — I739 Peripheral vascular disease, unspecified: Secondary | ICD-10-CM | POA: Diagnosis not present

## 2015-05-30 DIAGNOSIS — L603 Nail dystrophy: Secondary | ICD-10-CM | POA: Diagnosis not present

## 2015-05-30 DIAGNOSIS — M2042 Other hammer toe(s) (acquired), left foot: Secondary | ICD-10-CM | POA: Diagnosis not present

## 2015-05-30 DIAGNOSIS — L84 Corns and callosities: Secondary | ICD-10-CM | POA: Diagnosis not present

## 2015-06-14 DIAGNOSIS — M71572 Other bursitis, not elsewhere classified, left ankle and foot: Secondary | ICD-10-CM | POA: Diagnosis not present

## 2015-06-14 DIAGNOSIS — M2042 Other hammer toe(s) (acquired), left foot: Secondary | ICD-10-CM | POA: Diagnosis not present

## 2015-06-16 DIAGNOSIS — I10 Essential (primary) hypertension: Secondary | ICD-10-CM | POA: Diagnosis not present

## 2015-06-16 DIAGNOSIS — F322 Major depressive disorder, single episode, severe without psychotic features: Secondary | ICD-10-CM | POA: Diagnosis not present

## 2015-06-16 DIAGNOSIS — I509 Heart failure, unspecified: Secondary | ICD-10-CM | POA: Diagnosis not present

## 2015-06-16 DIAGNOSIS — D72819 Decreased white blood cell count, unspecified: Secondary | ICD-10-CM | POA: Diagnosis not present

## 2015-06-16 DIAGNOSIS — I25111 Atherosclerotic heart disease of native coronary artery with angina pectoris with documented spasm: Secondary | ICD-10-CM | POA: Diagnosis not present

## 2015-06-16 DIAGNOSIS — E7800 Pure hypercholesterolemia, unspecified: Secondary | ICD-10-CM | POA: Diagnosis not present

## 2015-06-16 DIAGNOSIS — Z1389 Encounter for screening for other disorder: Secondary | ICD-10-CM | POA: Diagnosis not present

## 2015-06-16 DIAGNOSIS — Z0001 Encounter for general adult medical examination with abnormal findings: Secondary | ICD-10-CM | POA: Diagnosis not present

## 2015-06-16 DIAGNOSIS — Z79899 Other long term (current) drug therapy: Secondary | ICD-10-CM | POA: Diagnosis not present

## 2015-07-03 DIAGNOSIS — M71571 Other bursitis, not elsewhere classified, right ankle and foot: Secondary | ICD-10-CM | POA: Diagnosis not present

## 2015-07-03 DIAGNOSIS — L97521 Non-pressure chronic ulcer of other part of left foot limited to breakdown of skin: Secondary | ICD-10-CM | POA: Diagnosis not present

## 2015-07-13 ENCOUNTER — Ambulatory Visit: Payer: Medicare Other | Admitting: Interventional Cardiology

## 2015-07-21 ENCOUNTER — Telehealth: Payer: Self-pay | Admitting: Interventional Cardiology

## 2015-07-21 NOTE — Telephone Encounter (Signed)
**Note De-Identified  Obfuscation** The pt states that about 2 weeks ago different parts of her body started to shake ( arm, hand, foot or leg) and that if she moves that part of her body and it stops. She reports that her heart beats fast sometimes and that her BP this am was 150/83 with a HR of 73. She states that she is unsure what is causing "the shakes" but wanted to talk with her cardiologist "just in case this is related to her heart.  She is advised that I am going to discuss with our DOD and call her back.

## 2015-07-21 NOTE — Telephone Encounter (Signed)
Per Cecilie Kicks, NP the pt is advised to contact her pcp and that if her pcp thinks her s/s are concerning her heart we will schedule her to be seen. She verbalized understanding and thanked me for my assistance.

## 2015-07-21 NOTE — Telephone Encounter (Signed)
[name] is stating that sometimes a part of her body starts shaking and she has to move another part of her body in order for it to stop and she thinks that her heart is causing this effect. Please call   Thanks

## 2015-09-15 DIAGNOSIS — I10 Essential (primary) hypertension: Secondary | ICD-10-CM | POA: Diagnosis not present

## 2015-09-15 DIAGNOSIS — Z79890 Hormone replacement therapy (postmenopausal): Secondary | ICD-10-CM | POA: Diagnosis not present

## 2015-09-15 DIAGNOSIS — R232 Flushing: Secondary | ICD-10-CM | POA: Diagnosis not present

## 2015-09-15 DIAGNOSIS — L84 Corns and callosities: Secondary | ICD-10-CM | POA: Diagnosis not present

## 2015-09-15 DIAGNOSIS — F322 Major depressive disorder, single episode, severe without psychotic features: Secondary | ICD-10-CM | POA: Diagnosis not present

## 2015-10-04 ENCOUNTER — Ambulatory Visit (INDEPENDENT_AMBULATORY_CARE_PROVIDER_SITE_OTHER): Payer: Medicare Other | Admitting: Interventional Cardiology

## 2015-10-04 ENCOUNTER — Encounter: Payer: Self-pay | Admitting: Interventional Cardiology

## 2015-10-04 VITALS — BP 142/90 | HR 109 | Ht 66.0 in | Wt 146.8 lb

## 2015-10-04 DIAGNOSIS — R0609 Other forms of dyspnea: Secondary | ICD-10-CM

## 2015-10-04 DIAGNOSIS — R9439 Abnormal result of other cardiovascular function study: Secondary | ICD-10-CM | POA: Diagnosis not present

## 2015-10-04 DIAGNOSIS — I10 Essential (primary) hypertension: Secondary | ICD-10-CM

## 2015-10-04 NOTE — Patient Instructions (Signed)
**Note De-identified  Obfuscation** Medication Instructions:  Same-no changes  Labwork: None  Testing/Procedures: None  Follow-Up: Your physician wants you to follow-up in: 1 year. You will receive a reminder letter in the mail two months in advance. If you don't receive a letter, please call our office to schedule the follow-up appointment.      If you need a refill on your cardiac medications before your next appointment, please call your pharmacy.   

## 2015-10-04 NOTE — Progress Notes (Signed)
Patient ID: [name], female   DOB: 06/14/1916, 82 y.o.   MRN: [id number]     Cardiology Office Note   Date:  10/04/2015   ID:  [name], DOB [birth year], MRN [id number]  PCP:  Henrine Screws, MD    Chief Complaint  Patient presents with  . Edema    BILATERAL  SHOB, abnormal stress test   Wt Readings from Last 3 Encounters:  10/04/15 146 lb 12.8 oz (66.6 kg)  12/27/14 152 lb (68.9 kg)  09/20/14 162 lb 11.2 oz (73.8 kg)       History of Present Illness: [name] is a 82 y.o. female  who had an abnormal stress test several years ago.  She has been medically managed for this. SHe has DOE when she walks fast. Walking is limited by back and foot pain. No chest pain with activity.   She was hospitalized in August 2016 for UTI which led to Escherichia coli bacteremia. Since that time, she does not feel that she has returned to normal. She denies any lower extremity swelling. No change in her weight. She is not feel like she is retaining fluid. She just feels like she is more short of breath than usual. She sleeps on one pillow. She is not having to sit up to catch her breath. She had lab work earlier today. We worked her in to see her due to her symptoms of shortness of breath.  She admits to being nervous more of the time.  She has fallen once since the last visit, thankfully on carpet.  She feels some DOE with prolonged walking.  No chest pain.  She feels her legs are weak.  She has had persistent leg edema.   Blood pressure has been well controlled.  She is with her attendant who helps take care of her.     Past Medical History:  Diagnosis Date  . Allergic rhinitis   . CAD (coronary artery disease)   . CHF (congestive heart failure) ([place])   . Cough   . Diverticular disease   . Diverticulosis   . DJD (degenerative joint disease) of knee   . DJD (degenerative joint disease) of lumbar spine    scoliosis  . Eczema   . Esophageal erosions    from nsaid   . GI bleed   . Hormone replacement therapy (postmenopausal)   . Hyperglycemia   . Hyperlipidemia   . Hypertension   . Orthopnea   . Pneumonia    july 2009  . Reactive depression (situational)   . Sciatica    right lower extremity  . Shingles    right thoracic 2008  . Vertigo     Past Surgical History:  Procedure Laterality Date  . APPENDECTOMY    . BACK SURGERY    . BREAST LUMPECTOMY    . FLEXIBLE SIGMOIDOSCOPY Left 08/13/2013   Procedure: FLEXIBLE SIGMOIDOSCOPY;  Surgeon: Arta Silence, MD;  Location: [facility];  Service: Endoscopy;  Laterality: Left;  . TOTAL ABDOMINAL HYSTERECTOMY       Current Outpatient Prescriptions  Medication Sig Dispense Refill  . acetaminophen (TYLENOL) 500 MG tablet Take 1,000 mg by mouth every 6 (six) hours as needed for moderate pain.    Marland Kitchen aspirin EC 81 MG tablet Take 81 mg by mouth every other day.    Marland Kitchen atenolol (TENORMIN) 25 MG tablet Take 25 mg by mouth every morning.     . citalopram (CELEXA) 40 MG tablet Take 40 mg  by mouth every morning.     . clotrimazole (LOTRIMIN) 1 % cream Apply 1 application topically as needed (foot -- infection/rash).     Marland Kitchen estrogens, conjugated, (PREMARIN) 0.3 MG tablet Take 0.3 mg by mouth every morning.     . furosemide (LASIX) 40 MG tablet Take 40 mg by mouth every other day.     . isosorbide mononitrate (IMDUR) 30 MG 24 hr tablet Take 1 tablet (30 mg total) by mouth daily. 90 tablet 3  . ketoconazole (NIZORAL) 2 % cream Apply 1 application topically 2 (two) times daily as needed for irritation.    Marland Kitchen loperamide (AF-LOPERAMIDE HCL) 2 MG tablet Take 2 mg by mouth as needed for diarrhea or loose stools. 1 capsule up to 8 tablets per 24hour 8 times a day    . meclizine (ANTIVERT) 25 MG tablet Take 25 mg by mouth 2 (two) times daily as needed for dizziness or nausea.     . Polyvinyl Alcohol-Povidone (REFRESH OP) Apply 1 drop to eye 2 (two) times daily.    . potassium chloride (KLOR-CON) 10 MEQ CR  tablet Take 10 mEq by mouth 3 (three) times daily.     . Prenatal Vit-Fe Fumarate-FA (PRENATAL PO) Take 1 tablet by mouth daily.    Marland Kitchen triamcinolone cream (KENALOG) 0.1 % Apply 1 application topically 2 (two) times daily as needed (rash).      No current facility-administered medications for this visit.     Allergies:   Ace inhibitors; Streptomycin; Tramadol; and Penicillins    Social History:  The patient  reports that she has never smoked. She has never used smokeless tobacco. She reports that she drinks alcohol. She reports that she does not use drugs.   Family History:  The patient's family history includes Cancer in her brother; Heart attack in her brother, father, mother, and sister; Heart disease in her daughter, father, mother, sister, and son; Hypertension in her brother.    ROS:  Please see the history of present illness.   Otherwise, review of systems are positive for shortness of breath as noted above.   All other systems are reviewed and negative.    PHYSICAL EXAM: VS:  BP (!) 142/90   Pulse (!) 109   Ht 5\' 6"  (1.676 m)   Wt 146 lb 12.8 oz (66.6 kg)   BMI 23.69 kg/m  , BMI Body mass index is 23.69 kg/m. GEN: Well nourished, well developed, in no acute distress  HEENT: normal  Neck: no JVD, carotid bruits, or masses Cardiac: Premature beats, otherwise RRR; no murmurs, rubs, or gallops, minimal bilateral ankle edema  Respiratory:  clear to auscultation bilaterally, normal work of breathing GI: soft, nontender, nondistended, + BS MS: no deformity or atrophy  Skin: warm and dry, no rash Neuro:  Strength and sensation are intact Psych: euthymic mood, full affect   EKG:   The ekg ordered today demonstrates normal sinus rhythm, PVCs   Recent Labs: 12/27/2014: Brain Natriuretic Peptide 142.0; BUN 17; Creat 1.14; Hemoglobin 13.1; Platelets 166; Potassium 3.5; Sodium 138   Lipid Panel No results found for: CHOL, TRIG, HDL, CHOLHDL, VLDL, LDLCALC, LDLDIRECT   Other  studies Reviewed: Additional studies/ records that were reviewed today with results demonstrating: prior stress test result as noted.   ASSESSMENT AND PLAN:  1.  Abnormal stress test: Lateral defect noted several years ago. She has had her angina controlled medically. She has had normal left ventricular function. I don't think her current symptoms are ischemia  related. No changes to her ECG. 2. Shortness of breath: Likely multifactorial. Stable.  May be related to deconditioning. She was hospitalized last year and has never quite fully gotten back to full activity. This will be difficult given her age.  I don't think she is in florid heart failure.   3. Hypertension: Continue current blood pressure lowering therapy. Blood pressure fairly well controlled today.  Pulse recheck 94 bpm.  4. She gave up driving.     Current medicines are reviewed at length with the patient today.  The patient concerns regarding her medicines were addressed.  The following changes have been made:  No change  Labs/ tests ordered today include:  No orders of the defined types were placed in this encounter.   Recommend 150 minutes/week of aerobic exercise Low fat, low carb, high fiber diet recommended  Disposition:   FU as scheduled   Signed, Larae Grooms, MD  10/04/2015 11:16 AM    [facility] [place], [place],   [address] Phone: [phone]; Fax: [phone]

## 2015-10-09 DIAGNOSIS — L97512 Non-pressure chronic ulcer of other part of right foot with fat layer exposed: Secondary | ICD-10-CM | POA: Diagnosis not present

## 2015-10-10 ENCOUNTER — Other Ambulatory Visit: Payer: Self-pay | Admitting: *Deleted

## 2015-10-10 MED ORDER — ISOSORBIDE MONONITRATE ER 30 MG PO TB24: 30.0000 mg | ORAL_TABLET | Freq: Every day | ORAL | 3 refills | Status: DC

## 2015-10-17 DIAGNOSIS — M1712 Unilateral primary osteoarthritis, left knee: Secondary | ICD-10-CM | POA: Diagnosis not present

## 2015-10-17 DIAGNOSIS — L84 Corns and callosities: Secondary | ICD-10-CM | POA: Diagnosis not present

## 2015-10-17 DIAGNOSIS — F322 Major depressive disorder, single episode, severe without psychotic features: Secondary | ICD-10-CM | POA: Diagnosis not present

## 2015-10-17 DIAGNOSIS — I10 Essential (primary) hypertension: Secondary | ICD-10-CM | POA: Diagnosis not present

## 2015-10-17 DIAGNOSIS — R232 Flushing: Secondary | ICD-10-CM | POA: Diagnosis not present

## 2015-10-17 DIAGNOSIS — Z79890 Hormone replacement therapy (postmenopausal): Secondary | ICD-10-CM | POA: Diagnosis not present

## 2015-10-17 DIAGNOSIS — R000 Tachycardia, unspecified: Secondary | ICD-10-CM | POA: Diagnosis not present

## 2015-10-23 DIAGNOSIS — L97512 Non-pressure chronic ulcer of other part of right foot with fat layer exposed: Secondary | ICD-10-CM | POA: Diagnosis not present

## 2015-11-10 DIAGNOSIS — L03032 Cellulitis of left toe: Secondary | ICD-10-CM | POA: Diagnosis not present

## 2015-11-10 DIAGNOSIS — L97511 Non-pressure chronic ulcer of other part of right foot limited to breakdown of skin: Secondary | ICD-10-CM | POA: Diagnosis not present

## 2015-11-24 DIAGNOSIS — L97512 Non-pressure chronic ulcer of other part of right foot with fat layer exposed: Secondary | ICD-10-CM | POA: Diagnosis not present

## 2015-12-06 DIAGNOSIS — M48062 Spinal stenosis, lumbar region with neurogenic claudication: Secondary | ICD-10-CM | POA: Diagnosis not present

## 2015-12-06 DIAGNOSIS — M5416 Radiculopathy, lumbar region: Secondary | ICD-10-CM | POA: Diagnosis not present

## 2015-12-12 DIAGNOSIS — H52223 Regular astigmatism, bilateral: Secondary | ICD-10-CM | POA: Diagnosis not present

## 2015-12-12 DIAGNOSIS — H5203 Hypermetropia, bilateral: Secondary | ICD-10-CM | POA: Diagnosis not present

## 2015-12-12 DIAGNOSIS — H04123 Dry eye syndrome of bilateral lacrimal glands: Secondary | ICD-10-CM | POA: Diagnosis not present

## 2015-12-12 DIAGNOSIS — H43813 Vitreous degeneration, bilateral: Secondary | ICD-10-CM | POA: Diagnosis not present

## 2015-12-12 DIAGNOSIS — H26492 Other secondary cataract, left eye: Secondary | ICD-10-CM | POA: Diagnosis not present

## 2015-12-12 DIAGNOSIS — Z961 Presence of intraocular lens: Secondary | ICD-10-CM | POA: Diagnosis not present

## 2015-12-12 DIAGNOSIS — H524 Presbyopia: Secondary | ICD-10-CM | POA: Diagnosis not present

## 2015-12-15 DIAGNOSIS — M71571 Other bursitis, not elsewhere classified, right ankle and foot: Secondary | ICD-10-CM | POA: Diagnosis not present

## 2015-12-27 DIAGNOSIS — I10 Essential (primary) hypertension: Secondary | ICD-10-CM | POA: Diagnosis not present

## 2015-12-27 DIAGNOSIS — M1712 Unilateral primary osteoarthritis, left knee: Secondary | ICD-10-CM | POA: Diagnosis not present

## 2015-12-27 DIAGNOSIS — L84 Corns and callosities: Secondary | ICD-10-CM | POA: Diagnosis not present

## 2015-12-27 DIAGNOSIS — I5032 Chronic diastolic (congestive) heart failure: Secondary | ICD-10-CM | POA: Diagnosis not present

## 2015-12-27 DIAGNOSIS — R232 Flushing: Secondary | ICD-10-CM | POA: Diagnosis not present

## 2015-12-27 DIAGNOSIS — R000 Tachycardia, unspecified: Secondary | ICD-10-CM | POA: Diagnosis not present

## 2015-12-27 DIAGNOSIS — F322 Major depressive disorder, single episode, severe without psychotic features: Secondary | ICD-10-CM | POA: Diagnosis not present

## 2015-12-27 DIAGNOSIS — I2510 Atherosclerotic heart disease of native coronary artery without angina pectoris: Secondary | ICD-10-CM | POA: Diagnosis not present

## 2015-12-27 DIAGNOSIS — R634 Abnormal weight loss: Secondary | ICD-10-CM | POA: Diagnosis not present

## 2016-01-05 DIAGNOSIS — B0222 Postherpetic trigeminal neuralgia: Secondary | ICD-10-CM | POA: Diagnosis not present

## 2016-01-15 DIAGNOSIS — L97512 Non-pressure chronic ulcer of other part of right foot with fat layer exposed: Secondary | ICD-10-CM | POA: Diagnosis not present

## 2016-01-16 DIAGNOSIS — B0222 Postherpetic trigeminal neuralgia: Secondary | ICD-10-CM | POA: Diagnosis not present

## 2016-01-25 ENCOUNTER — Emergency Department (HOSPITAL_COMMUNITY): Payer: Medicare Other

## 2016-01-25 ENCOUNTER — Encounter (HOSPITAL_COMMUNITY): Payer: Self-pay | Admitting: Radiology

## 2016-01-25 ENCOUNTER — Emergency Department (HOSPITAL_COMMUNITY)
Admission: EM | Admit: 2016-01-25 | Discharge: 2016-01-25 | Disposition: A | Discharge status: Home / Self Care | Payer: Medicare Other | Attending: Emergency Medicine | Admitting: Emergency Medicine

## 2016-01-25 DIAGNOSIS — Z7982 Long term (current) use of aspirin: Secondary | ICD-10-CM | POA: Diagnosis not present

## 2016-01-25 DIAGNOSIS — Z79899 Other long term (current) drug therapy: Secondary | ICD-10-CM | POA: Insufficient documentation

## 2016-01-25 DIAGNOSIS — I130 Hypertensive heart and chronic kidney disease with heart failure and stage 1 through stage 4 chronic kidney disease, or unspecified chronic kidney disease: Secondary | ICD-10-CM | POA: Insufficient documentation

## 2016-01-25 DIAGNOSIS — I509 Heart failure, unspecified: Secondary | ICD-10-CM | POA: Diagnosis not present

## 2016-01-25 DIAGNOSIS — R42 Dizziness and giddiness: Secondary | ICD-10-CM | POA: Diagnosis not present

## 2016-01-25 DIAGNOSIS — R404 Transient alteration of awareness: Secondary | ICD-10-CM | POA: Diagnosis not present

## 2016-01-25 DIAGNOSIS — N183 Chronic kidney disease, stage 3 (moderate): Secondary | ICD-10-CM | POA: Diagnosis not present

## 2016-01-25 DIAGNOSIS — I2510 Atherosclerotic heart disease of native coronary artery without angina pectoris: Secondary | ICD-10-CM | POA: Insufficient documentation

## 2016-01-25 LAB — URINALYSIS, ROUTINE W REFLEX MICROSCOPIC
Bilirubin Urine: NEGATIVE
Glucose, UA: NEGATIVE mg/dL
Hgb urine dipstick: NEGATIVE
Ketones, ur: NEGATIVE mg/dL
Leukocytes, UA: NEGATIVE
Nitrite: NEGATIVE
Protein, ur: NEGATIVE mg/dL
Specific Gravity, Urine: 1.015 (ref 1.005–1.030)
pH: 5 (ref 5.0–8.0)

## 2016-01-25 LAB — CBC WITH DIFFERENTIAL/PLATELET
Basophils Absolute: 0 10*3/uL (ref 0.0–0.1)
Basophils Relative: 0 %
Eosinophils Absolute: 0 10*3/uL (ref 0.0–0.7)
Eosinophils Relative: 0 %
HCT: 42.8 % (ref 36.0–46.0)
Hemoglobin: 14.2 g/dL (ref 12.0–15.0)
Lymphocytes Relative: 29 %
Lymphs Abs: 3 10*3/uL (ref 0.7–4.0)
MCH: 31.4 pg (ref 26.0–34.0)
MCHC: 33.2 g/dL (ref 30.0–36.0)
MCV: 94.7 fL (ref 78.0–100.0)
Monocytes Absolute: 1 10*3/uL (ref 0.1–1.0)
Monocytes Relative: 10 %
Neutro Abs: 6.2 10*3/uL (ref 1.7–7.7)
Neutrophils Relative %: 61 %
Platelets: 159 10*3/uL (ref 150–400)
RBC: 4.52 MIL/uL (ref 3.87–5.11)
RDW: 16 % — ABNORMAL HIGH (ref 11.5–15.5)
WBC: 10.2 10*3/uL (ref 4.0–10.5)

## 2016-01-25 LAB — COMPREHENSIVE METABOLIC PANEL
ALT: 16 U/L (ref 14–54)
AST: 25 U/L (ref 15–41)
Albumin: 4 g/dL (ref 3.5–5.0)
Alkaline Phosphatase: 73 U/L (ref 38–126)
Anion gap: 9 (ref 5–15)
BUN: 16 mg/dL (ref 6–20)
CO2: 26 mmol/L (ref 22–32)
Calcium: 9.7 mg/dL (ref 8.9–10.3)
Chloride: 108 mmol/L (ref 101–111)
Creatinine, Ser: 1.05 mg/dL — ABNORMAL HIGH (ref 0.44–1.00)
GFR calc Af Amer: 50 mL/min — ABNORMAL LOW (ref >60)
GFR calc non Af Amer: 43 mL/min — ABNORMAL LOW (ref >60)
Glucose, Bld: 109 mg/dL — ABNORMAL HIGH (ref 65–99)
Potassium: 3.8 mmol/L (ref 3.5–5.1)
Sodium: 143 mmol/L (ref 135–145)
Total Bilirubin: 0.5 mg/dL (ref 0.3–1.2)
Total Protein: 7.7 g/dL (ref 6.5–8.1)

## 2016-01-25 LAB — TROPONIN I: Troponin I: 0.07 ng/mL (ref <0.03)

## 2016-01-25 NOTE — ED Provider Notes (Signed)
[place] DEPT Provider Note   CSN: [id number] Arrival date & time: 01/25/16  1042     History   Chief Complaint Chief Complaint  Patient presents with  . Dizziness  . Chest Pain    HPI [name] is a 82 y.o. female.  HPI Patient presents to the emergency department with dizziness over the last [redacted] weeks along with a swishing feeling in her ears.  Patient has also had some intermittent chest pains last for seconds at a time.  Patient states that she was recently stopped on her estrogen.  She states that that was about the same time that the symptoms startedThe patient denies , shortness of breath, headache,blurred vision, neck pain, fever, cough, weakness, numbness,  anorexia, edema, abdominal pain, nausea, vomiting, diarrhea, rash, back pain, dysuria, hematemesis, bloody stool, near syncope, or syncope. Past Medical History:  Diagnosis Date  . Allergic rhinitis   . CAD (coronary artery disease)   . CHF (congestive heart failure) ([place])   . Cough   . Diverticular disease   . Diverticulosis   . DJD (degenerative joint disease) of knee   . DJD (degenerative joint disease) of lumbar spine    scoliosis  . Eczema   . Esophageal erosions    from nsaid   . GI bleed   . Hormone replacement therapy (postmenopausal)   . Hyperglycemia   . Hyperlipidemia   . Hypertension   . Orthopnea   . Pneumonia    july 2009  . Reactive depression (situational)   . Sciatica    right lower extremity  . Shingles    right thoracic 2008  . Vertigo     Patient Active Problem List   Diagnosis Date Noted  . Bacteremia 09/18/2014  . Fever   . Febrile illness 09/12/2014  . Diverticulitis 09/12/2014  . CKD (chronic kidney disease) stage 3, GFR 30-59 ml/min 09/12/2014  . HTN (hypertension) 09/12/2014  . Thrombocytopenia ([place]) 09/12/2014  . Abnormal cardiovascular stress test 01/13/2014  . Rectal bleed 08/10/2013  . Acute renal failure ([place]) 08/10/2013  . Essential hypertension,  benign 01/08/2013  . DOE (dyspnea on exertion) 01/08/2013    Past Surgical History:  Procedure Laterality Date  . APPENDECTOMY    . BACK SURGERY    . BREAST LUMPECTOMY    . FLEXIBLE SIGMOIDOSCOPY Left 08/13/2013   Procedure: FLEXIBLE SIGMOIDOSCOPY;  Surgeon: Arta Silence, MD;  Location: [facility];  Service: Endoscopy;  Laterality: Left;  . TOTAL ABDOMINAL HYSTERECTOMY      OB History    No data available       Home Medications    Prior to Admission medications   Medication Sig Start Date End Date Taking? Authorizing Provider  acetaminophen (TYLENOL) 500 MG tablet Take 1,000 mg by mouth every 6 (six) hours as needed for moderate pain.   Yes Historical Provider, MD  aspirin EC 81 MG tablet Take 81 mg by mouth every other day.   Yes Historical Provider, MD  atenolol (TENORMIN) 25 MG tablet Take 25 mg by mouth every morning.    Yes Historical Provider, MD  clotrimazole (LOTRIMIN) 1 % cream Apply 1 application topically as needed (foot -- infection/rash).    Yes Historical Provider, MD  furosemide (LASIX) 40 MG tablet Take 40 mg by mouth every other day.    Yes Historical Provider, MD  isosorbide mononitrate (IMDUR) 30 MG 24 hr tablet Take 1 tablet (30 mg total) by mouth daily. 10/10/15  Yes Jettie Booze, MD  loperamide (IMODIUM) 2 MG capsule Take 2 mg by mouth daily. 01/08/16  Yes Historical Provider, MD  meclizine (ANTIVERT) 25 MG tablet Take 25 mg by mouth 2 (two) times daily as needed for dizziness or nausea.    Yes Historical Provider, MD  Polyvinyl Alcohol-Povidone (REFRESH OP) Apply 1 drop to eye 2 (two) times daily.   Yes Historical Provider, MD  potassium chloride SA (K-DUR,KLOR-CON) 20 MEQ tablet Take 20 mEq by mouth 2 (two) times daily. 11/28/15  Yes Historical Provider, MD  Prenatal Vit-Fe Fumarate-FA (PRENATAL PO) Take 1 tablet by mouth daily.   Yes Historical Provider, MD  venlafaxine XR (EFFEXOR-XR) 75 MG 24 hr capsule Take 75 mg by mouth daily. 01/08/16  Yes  Historical Provider, MD  estrogens, conjugated, (PREMARIN) 0.3 MG tablet Take 0.3 mg by mouth every morning.     Historical Provider, MD    Family History Family History  Problem Relation Age of Onset  . Heart disease Father   . Heart attack Father   . Heart disease Mother   . Heart attack Mother   . Heart disease Daughter   . Heart disease Son   . Cancer Brother   . Heart disease Sister   . Heart attack Sister     X2  . Heart attack Brother   . Hypertension Brother   . Stroke Neg Hx     Social History Social History  Substance Use Topics  . Smoking status: Never Smoker  . Smokeless tobacco: Never Used  . Alcohol use Yes     Comment: occasional cocktail     Allergies   Ace inhibitors; Streptomycin; Tramadol; and Penicillins   Review of Systems Review of Systems All other systems negative except as documented in the HPI. All pertinent positives and negatives as reviewed in the HPI.  Physical Exam Updated Vital Signs BP 147/85 (BP Location: Right Arm)   Pulse 76   Temp 97.9 F (36.6 C) (Oral)   Resp 16   SpO2 96%   Physical Exam  Constitutional: She is oriented to person, place, and time. She appears well-developed and well-nourished. No distress.  HENT:  Head: Normocephalic and atraumatic.  Mouth/Throat: Oropharynx is clear and moist.  Eyes: Pupils are equal, round, and reactive to light.  Neck: Normal range of motion. Neck supple.  Cardiovascular: Normal rate, regular rhythm and normal heart sounds.  Exam reveals no gallop and no friction rub.   No murmur heard. Pulmonary/Chest: Effort normal and breath sounds normal. No respiratory distress. She has no wheezes.  Abdominal: Soft. Bowel sounds are normal. She exhibits no distension. There is no tenderness.  Musculoskeletal: She exhibits no edema.  Neurological: She is alert and oriented to person, place, and time. She exhibits normal muscle tone. Coordination normal.  Skin: Skin is warm and dry. Capillary  refill takes less than 2 seconds. No rash noted. No erythema.  Psychiatric: She has a normal mood and affect. Her behavior is normal.  Nursing note and vitals reviewed.    ED Treatments / Results  Labs (all labs ordered are listed, but only abnormal results are displayed) Labs Reviewed  COMPREHENSIVE METABOLIC PANEL - Abnormal; Notable for the following:       Result Value   Glucose, Bld 109 (*)    Creatinine, Ser 1.05 (*)    GFR calc non Af Amer 43 (*)    GFR calc Af Amer 50 (*)    All other components within normal limits  CBC WITH DIFFERENTIAL/PLATELET -  Abnormal; Notable for the following:    RDW 16.0 (*)    All other components within normal limits  TROPONIN I - Abnormal; Notable for the following:    Troponin I 0.07 (*)    All other components within normal limits  URINALYSIS, ROUTINE W REFLEX MICROSCOPIC    EKG  EKG Interpretation  Date/Time:  Thursday January 25 2016 12:32:15 EST Ventricular Rate:  69 PR Interval:    QRS Duration: 88 QT Interval:  424 QTC Calculation: 455 R Axis:   -28 Text Interpretation:  Sinus rhythm Multiform ventricular premature complexes Inferoposterior infarct, old since last tracing no significant change Confirmed by Eulis Foster  MD, ELLIOTT [phone]) on 01/25/2016 2:19:42 PM       Radiology Ct Head Wo Contrast  Result Date: 01/25/2016 CLINICAL DATA:  Dizziness for 2 weeks. History of vertigo and hypertension. EXAM: CT HEAD WITHOUT CONTRAST TECHNIQUE: Contiguous axial images were obtained from the base of the skull through the vertex without intravenous contrast. COMPARISON:  04/13/2011 FINDINGS: Brain: No evidence of acute infarction, hemorrhage, hydrocephalus, extra-axial collection or mass lesion/mass effect. There is age appropriate ventricular and sulcal enlargement. Vascular: No hyperdense vessel or unexpected calcification. Skull: Normal. Negative for fracture or focal lesion. Sinuses/Orbits: Globes orbits are unremarkable. Visualized  sinuses and mastoid air cells are clear. Other: None. IMPRESSION: 1. No acute intracranial abnormalities.  Normal exam for age. Electronically Signed   By: Lajean Manes M.D.   On: 01/25/2016 14:05    Procedures Procedures (including critical care time)  Medications Ordered in ED Medications - No data to display   Initial Impression / Assessment and Plan / ED Course  I have reviewed the triage vital signs and the nursing notes.  Pertinent labs & imaging results that were available during my care of the patient were reviewed by me and considered in my medical decision making (see chart for details).  Clinical Course as of Jan 25 1520  Thu Jan 25, 2016  1417 CO2: 26 [CL]    Clinical Course User Index [CL] Dalia Heading, PA-C    Patient's troponin is chronically elevated.  Patient most likely has a myriad of symptoms is actually been ongoing for the 2 week.  Due to the recent changes in her medication.  I advised follow-up with her primary care Dr. told to return here as needed.  Patient agrees the plan and she states she would like to be discharged home  Final Clinical Impressions(s) / ED Diagnoses   Final diagnoses:  None    New Prescriptions New Prescriptions   No medications on file     Dalia Heading, PA-C 01/28/16 0104    Daleen Bo, MD 01/28/16 2011

## 2016-01-25 NOTE — ED Notes (Signed)
Ambulated without difficulty.

## 2016-01-25 NOTE — ED Notes (Signed)
Bed: WA01 Expected date:  Expected time:  Means of arrival:  Comments: 

## 2016-01-25 NOTE — ED Provider Notes (Signed)
  Face-to-face evaluation   History: She presents for evaluation of hot and cold feelings, occasional chest pain, a "whooshing sound" in her head and dizziness, which is worse when standing. Symptoms progressive over the last several weeks, since stopping Premarin. She is eating well. She has a a, daily. She is here with her niece who watches over her.  Physical exam: Mortality female. No dysarthria or aphasia. She is able to stand, but is somewhat steady after being up for about a minute. No deformities of the arms or legs.  Medical screening examination/treatment/procedure(s) were conducted as a shared visit with non-physician practitioner(s) and myself.  I personally evaluated the patient during the encounter   Daleen Bo, MD 01/28/16 2012

## 2016-01-25 NOTE — ED Notes (Signed)
Primary RN made aware of patient's troponin

## 2016-01-25 NOTE — ED Notes (Signed)
Pt states "My head feels like I have been to the ocean".

## 2016-01-25 NOTE — Discharge Instructions (Signed)
Return here as needed.  Follow-up with your primary care Dr. increase her fluid intake °

## 2016-01-25 NOTE — ED Triage Notes (Signed)
Per EMS, pt is from home with complaints of chest pain and dizziness X2 weeks. Pt has a history of CHF and vertigo. Per EMS pt is AOx4.

## 2016-01-26 DIAGNOSIS — R232 Flushing: Secondary | ICD-10-CM | POA: Diagnosis not present

## 2016-01-26 DIAGNOSIS — R21 Rash and other nonspecific skin eruption: Secondary | ICD-10-CM | POA: Diagnosis not present

## 2016-01-26 DIAGNOSIS — I2510 Atherosclerotic heart disease of native coronary artery without angina pectoris: Secondary | ICD-10-CM | POA: Diagnosis not present

## 2016-01-26 DIAGNOSIS — R634 Abnormal weight loss: Secondary | ICD-10-CM | POA: Diagnosis not present

## 2016-01-26 DIAGNOSIS — Z79890 Hormone replacement therapy (postmenopausal): Secondary | ICD-10-CM | POA: Diagnosis not present

## 2016-02-19 DIAGNOSIS — K648 Other hemorrhoids: Secondary | ICD-10-CM | POA: Diagnosis not present

## 2016-03-04 DIAGNOSIS — H903 Sensorineural hearing loss, bilateral: Secondary | ICD-10-CM | POA: Diagnosis not present

## 2016-03-25 DIAGNOSIS — H903 Sensorineural hearing loss, bilateral: Secondary | ICD-10-CM | POA: Diagnosis not present

## 2016-03-26 DIAGNOSIS — K648 Other hemorrhoids: Secondary | ICD-10-CM | POA: Diagnosis not present

## 2016-03-27 DIAGNOSIS — F341 Dysthymic disorder: Secondary | ICD-10-CM | POA: Diagnosis not present

## 2016-03-27 DIAGNOSIS — R630 Anorexia: Secondary | ICD-10-CM | POA: Diagnosis not present

## 2016-05-09 DIAGNOSIS — F341 Dysthymic disorder: Secondary | ICD-10-CM | POA: Diagnosis not present

## 2016-05-09 DIAGNOSIS — R630 Anorexia: Secondary | ICD-10-CM | POA: Diagnosis not present

## 2016-05-09 DIAGNOSIS — L270 Generalized skin eruption due to drugs and medicaments taken internally: Secondary | ICD-10-CM | POA: Diagnosis not present

## 2016-05-15 DIAGNOSIS — L84 Corns and callosities: Secondary | ICD-10-CM | POA: Diagnosis not present

## 2016-05-15 DIAGNOSIS — I739 Peripheral vascular disease, unspecified: Secondary | ICD-10-CM | POA: Diagnosis not present

## 2016-05-15 DIAGNOSIS — L603 Nail dystrophy: Secondary | ICD-10-CM | POA: Diagnosis not present

## 2016-05-27 ENCOUNTER — Encounter: Payer: Self-pay | Admitting: Physician Assistant

## 2016-05-27 ENCOUNTER — Telehealth: Payer: Self-pay | Admitting: Interventional Cardiology

## 2016-05-27 NOTE — Telephone Encounter (Signed)
I spoke with pt's caregiver who reports pt has been experiencing increased shortness of breath for last week. Does not weigh daily.  Slight puffiness around ankles but this has not increased recently. Woke up during night on Saturday and Sunday due to shortness of breath.  She is not able to come in for office visit today.  I scheduled pt to see Melina Copa, PA tomorrow at 10:30

## 2016-05-27 NOTE — Telephone Encounter (Signed)
Thanks! I will plan to order when I see her. I also see she has a h/o GIB so to be thorough will likely check a CBC as well. Exavier Lina PA-C

## 2016-05-27 NOTE — Telephone Encounter (Signed)
Pt c/o Shortness Of Breath: STAT if SOB developed within the last 24 hours or pt is noticeably SOB on the phone  1. Are you currently SOB (can you hear that pt is SOB on the phone)? Caregiver calling  2. How long have you been experiencing SOB?   3. Are you SOB when sitting or when up moving around?   no other symptoms reported,   Patient caregiver, Cornelia calling and states that patient was having SOB. If Cornelia cannot be reached at home number, you can contact her mobile  [phone]

## 2016-05-27 NOTE — Telephone Encounter (Signed)
Would probably check BNP and BMet at her visit.

## 2016-05-27 NOTE — Progress Notes (Addendum)
Cardiology Office Note    Date:  05/28/2016  ID:  LEONARDO PLAIA, DOB 08-23-1916, MRN [id number] PCP:  Henrine Screws, MD  Cardiologist:  Dr. Irish Lack   Chief Complaint: shortness of breath  History of Present Illness:  TAYLIN MANS is a 83 y.o. female with history of diverticular disease, DJD, esophageal erosions, GIB, hyperglycemia, HTN, hyperlipidemia, sciatica, CKD III, vertigo, and prior PVCs by EKG. Her chart lists CAD and CHF in her PMH; prior 2D echo 03/2008 showed normal biventricular thickness, chamber size and systolic function, EF 83-41%, mild mitral regurgitation, aortic sclerosis, moderate pulmonary HTN, pulmonic regurgitation. Dr. Irish Lack also references a remote stress test with lateral defect several years ago which was treated medically. Most recent labs 12/2015 showed Hgb 14.2, K 3.8, Cr 1.05 c/w CKD stage III adjusted for age.  She presents today for evaluation of shortness of breath. For the past 2 weeks she has noticed worsening DOE as well as orthopnea. She also has had sporadic episodes of sharp chest pain once a day that lasts several seconds and resolves spontaneously; has lasted up to a minute. This does not necessarily always occur with the dyspnea - seems to happen at random. The pain is not worse with exertion or inspiration. There is some focal soreness of the area with palpation. No fevers, chills, syncope, palpitations. She does get sporadic rectal bleeding which is known for her. She had some spotty bleeding 2 weeks ago. She is remarkably spry for her age - lives alone but taken care of for several years by caretaker [facility]. She clarifies her med list and says she's actually now taking Lasix once a day and aspirin twice a day. She drinks a lot of fluid daily and smiles when I ask her about recent salt intake.   Past Medical History:  Diagnosis Date  . Allergic rhinitis   . CAD (coronary artery disease)   . CHF (congestive heart failure) ([place])   .  CKD (chronic kidney disease) stage 3, GFR 30-59 ml/min 09/12/2014  . Diverticular disease   . Diverticulosis   . DJD (degenerative joint disease) of knee   . DJD (degenerative joint disease) of lumbar spine    scoliosis  . Eczema   . Esophageal erosions    from nsaid   . GI bleed   . Hormone replacement therapy (postmenopausal)   . Hyperglycemia   . Hyperlipidemia   . Hypertension   . Pneumonia    july 2009  . Pulmonary hypertension ([place])    a. 2D echo 03/2008 showed normal biventricular thickness, chamber size and systolic function, EF 96-22%, mild mitral regurgitation, aortic sclerosis, moderate pulmonary HTN, pulmonic regurgitation.  Marland Kitchen PVC's (premature ventricular contractions)   . Reactive depression (situational)   . Sciatica    right lower extremity  . Shingles    right thoracic 2008  . Vertigo     Past Surgical History:  Procedure Laterality Date  . APPENDECTOMY    . BACK SURGERY    . BREAST LUMPECTOMY    . FLEXIBLE SIGMOIDOSCOPY Left 08/13/2013   Procedure: FLEXIBLE SIGMOIDOSCOPY;  Surgeon: Arta Silence, MD;  Location: [facility];  Service: Endoscopy;  Laterality: Left;  . TOTAL ABDOMINAL HYSTERECTOMY      Current Medications: Current Outpatient Prescriptions  Medication Sig Dispense Refill  . acetaminophen (TYLENOL) 500 MG tablet Take 1,000 mg by mouth every 6 (six) hours as needed for moderate pain.    Marland Kitchen aspirin EC 81 MG tablet Take 81 mg by  mouth every other day. - taking BID    . atenolol (TENORMIN) 25 MG tablet Take 25 mg by mouth every morning.     . clotrimazole (LOTRIMIN) 1 % cream Apply 1 application topically as needed (foot -- infection/rash).     Marland Kitchen estrogens, conjugated, (PREMARIN) 0.3 MG tablet Take 0.3 mg by mouth every morning.     . furosemide (LASIX) 40 MG tablet Take 40 mg by mouth every other day.  - taking once a day    . isosorbide mononitrate (IMDUR) 30 MG 24 hr tablet Take 1 tablet (30 mg total) by mouth daily. 90 tablet 3  . loperamide  (IMODIUM) 2 MG capsule Take 2 mg by mouth daily.  0  . meclizine (ANTIVERT) 25 MG tablet Take 25 mg by mouth 2 (two) times daily as needed for dizziness or nausea.     . Polyvinyl Alcohol-Povidone (REFRESH OP) Apply 1 drop to eye 2 (two) times daily.    . potassium chloride SA (K-DUR,KLOR-CON) 20 MEQ tablet Take 20 mEq by mouth 2 (two) times daily.  0  . Prenatal Vit-Fe Fumarate-FA (PRENATAL PO) Take 1 tablet by mouth daily.    Marland Kitchen venlafaxine XR (EFFEXOR-XR) 75 MG 24 hr capsule Take 75 mg by mouth daily.  0   No current facility-administered medications for this visit.      Allergies:   Ace inhibitors; Streptomycin; Tramadol; and Penicillins   Social History   Social History  . Marital status: Widowed    Spouse name: N/A  . Number of children: 0  . Years of education: N/A   Occupational History  . retired Education officer, [employer]    Social History Main Topics  . Smoking status: Never Smoker  . Smokeless tobacco: Never Used  . Alcohol use Yes     Comment: occasional cocktail  . Drug use: No  . Sexual activity: Not Asked   Other Topics Concern  . None   Social History Narrative   Pt is widowed and lives alone.           Family History:  Family History  Problem Relation Age of Onset  . Heart disease Father   . Heart attack Father   . Heart disease Mother   . Heart attack Mother   . Heart disease Daughter   . Heart disease Son   . Cancer Brother   . Heart disease Sister   . Heart attack Sister     X2  . Heart attack Brother   . Hypertension Brother   . Stroke Neg Hx    ROS:   Please see the history of present illness.  All other systems are reviewed and otherwise negative.    PHYSICAL EXAM:   VS:  BP 124/84   Pulse 93   Ht 5\' 6"  (1.676 m)   Wt 145 lb 12 oz (66.1 kg)   SpO2 94%   BMI 23.52 kg/m   BMI: Body mass index is 23.52 kg/m. GEN: Well nourished, well developed elderly F appearing younger than stated age, in no acute distress  HEENT: normocephalic,  atraumatic Neck: no JVD, carotid bruits, or masses Cardiac: RRR; no murmurs, rubs, or gallops, minimal trace ankle edema bilaterally Respiratory:  Mildly diminished BS at bases, no rales or rhonchi or wheezing, normal work of breathing GI: soft, nontender, nondistended, + BS MS: no deformity or atrophy  Skin: warm and dry, no rash Neuro:  Alert and Oriented x 3, Strength and sensation are intact, follows commands Psych:  euthymic mood, full affect  Wt Readings from Last 3 Encounters:  05/28/16 145 lb 12 oz (66.1 kg)  10/04/15 146 lb 12.8 oz (66.6 kg)  12/27/14 152 lb (68.9 kg)      Studies/Labs Reviewed:   EKG:  EKG was ordered today and personally reviewed by me and demonstrates NSR 78bpm with one PVC, nonspecific ST-T changes, poor R wave progression, no sig change from prior.  Recent Labs: 01/25/2016: ALT 16; BUN 16; Creatinine, Ser 1.05; Hemoglobin 14.2; Platelets 159; Potassium 3.8; Sodium 143   Lipid Panel No results found for: CHOL, TRIG, HDL, CHOLHDL, VLDL, LDLCALC, LDLDIRECT  Additional studies/ records that were reviewed today include: Summarized above    ASSESSMENT & PLAN:   1. Shortness of breath/chest pain - shortness of breath and orthopnea are suspicious for volume overload. Dr. Irish Lack does note h/o SOB felt multifactorial in 09/2015 which he felt was possibly related to deconditioning and age. Will check BMET and BNP. Given h/o anemia and rectal bleeding would also recommend checking CBC. Will also obtain 2V CXR to help guide med adjustment. The chest pain component she has is somewhat more atypical. Given her reported h/o CHF and also pulm HTN by prior echo, will start with echocardiogram for assessment of this. Invasive ischemic evaluation less ideal given advanced age, h/o GIB, and CKD. Reviewed importance of sodium and fluid restriction. 2. History of abnormal stress test - see above. At some point she states her aspirin was changed to BID. Given h/o rectal  bleeding, will decrease aspirin to once daily. 3. Essential HTN - controlled. Will need to watch BP with possible med changes. 4. Moderate pulmonary HTN by prior echo - repeat echocardiogram to assess. This could be contributing to her DOE as well. 5. PVCs - one PVC noted on tracing today, also present on prior EKG.  Disposition: F/u with me after echocardiogram. Further med recommendations pending the above labs and CXR.    Medication Adjustments/Labs and Tests Ordered: Current medicines are reviewed at length with the patient today.  Concerns regarding medicines are outlined above. Medication changes, Labs and Tests ordered today are summarized above and listed in the Patient Instructions accessible in Encounters.   Raechel Ache PA-C  05/28/2016 11:21 AM    [facility] [place], [place], [place]  [address] Phone: [phone]; Fax: [phone]

## 2016-05-28 ENCOUNTER — Ambulatory Visit (INDEPENDENT_AMBULATORY_CARE_PROVIDER_SITE_OTHER): Payer: Medicare Other | Admitting: Physician Assistant

## 2016-05-28 ENCOUNTER — Ambulatory Visit
Admission: RE | Admit: 2016-05-28 | Discharge: 2016-05-28 | Disposition: A | Discharge status: Home / Self Care | Payer: Medicare Other | Source: Ambulatory Visit | Attending: Physician Assistant | Admitting: Physician Assistant

## 2016-05-28 ENCOUNTER — Encounter: Payer: Self-pay | Admitting: Physician Assistant

## 2016-05-28 VITALS — BP 124/84 | HR 93 | Ht 66.0 in | Wt 145.8 lb

## 2016-05-28 DIAGNOSIS — I493 Ventricular premature depolarization: Secondary | ICD-10-CM | POA: Insufficient documentation

## 2016-05-28 DIAGNOSIS — R05 Cough: Secondary | ICD-10-CM | POA: Diagnosis not present

## 2016-05-28 DIAGNOSIS — I272 Pulmonary hypertension, unspecified: Secondary | ICD-10-CM

## 2016-05-28 DIAGNOSIS — Z79899 Other long term (current) drug therapy: Secondary | ICD-10-CM

## 2016-05-28 DIAGNOSIS — R0602 Shortness of breath: Secondary | ICD-10-CM

## 2016-05-28 DIAGNOSIS — R9439 Abnormal result of other cardiovascular function study: Secondary | ICD-10-CM

## 2016-05-28 DIAGNOSIS — I10 Essential (primary) hypertension: Secondary | ICD-10-CM

## 2016-05-28 NOTE — Patient Instructions (Addendum)
Medication Instructions:   Your physician has recommended you make the following change in your medication:  1) TAKE Aspirin 81 mg once daily  Labwork:  Today: BMET, CBC w/ diff & BNP  Testing/Procedures:  A chest x-ray takes a picture of the organs and structures inside the chest, including the heart, lungs, and blood vessels. This test can show several things, including, whether the heart is enlarges; whether fluid is building up in the lungs; and whether pacemaker / defibrillator leads are still in place.  Your physician has requested that you have an echocardiogram. Echocardiography is a painless test that uses sound waves to create images of your heart. It provides your doctor with information about the size and shape of your heart and how well your heart's chambers and valves are working. This procedure takes approximately one hour. There are no restrictions for this procedure.  Follow-Up:  Your physician recommends that you schedule a follow-up appointment after echocardiogram is completed, with: Melina Copa, PA or [facility] care team APP.  - If you need a refill on your cardiac medications before your next appointment, please call your pharmacy.   Thank you for choosing [facility]!!     Any Other Special Instructions Will Be Listed Below (If Applicable).   For patients with history of fluid overload or congestive heart failure, we give them these special instructions:  1. Follow a low-salt diet and watch your fluid intake. In general, you should not be taking in more than 2 liters of fluid per day (no more than 8 glasses per day). Some patients are restricted to less than 1.5 liters of fluid per day (no more than 6 glasses per day). This includes sources of water in foods like soup, coffee, tea, milk, etc. 2. Weigh yourself on the same scale at same time of day and keep a log. 3. Call your doctor: (Anytime you feel any of the following symptoms)  - 3-4 pound weight gain in  1-2 days or 2 pounds overnight  - Shortness of breath, with or without a dry hacking cough  - Swelling in the hands, feet or stomach  - If you have to sleep on extra pillows at night in order to breathe   IT IS IMPORTANT TO LET YOUR DOCTOR KNOW EARLY ON IF YOU ARE HAVING SYMPTOMS SO WE CAN HELP YOU!

## 2016-05-28 NOTE — Progress Notes (Signed)
Please call patient.  CXR did not show any significant abnormalities. Labwork does not reveal she is overtly volume overloaded I would recommend she limit salt intake to less than 2000mg  a day for now and do not drink more than 64 oz per day. Continue taking Lasix 40mg  daily as she has been doing. In case symptoms represent worsening angina, can try increasing Imdur to 60mg  per day. If episodes recur, needs to go to ER so we can evaluate her while the episode is happening. [name] PA-C

## 2016-05-29 ENCOUNTER — Telehealth: Payer: Self-pay | Admitting: Interventional Cardiology

## 2016-05-29 NOTE — Telephone Encounter (Signed)
-----   Message from Charlie Pitter, [place] sent at 05/28/2016  4:57 PM EDT ----- Please call patient.  CXR did not show any significant abnormalities. Labwork does not reveal she is overtly volume overloaded I would recommend she limit salt intake to less than 2000mg  a day for now and do not drink more than 64 oz per day. Continue taking Lasix 40mg  daily as she has been doing. In case symptoms represent worsening angina, can try increasing Imdur to 60mg  per day. If episodes recur, needs to go to ER so we can evaluate her while the episode is happening. Dayna Dunn PA-C

## 2016-05-29 NOTE — Telephone Encounter (Signed)
Spoke with Cornelia Powers (DPR on file) and made her aware that the CXR did not show any significant abnormalities. Labwork does not reveal she is overtly volume overloaded. Advised that the patient should limit salt intake to less than 2000mg  a day for now and do not drink more than 64 oz per day. Advised for the patient to continue taking Lasix 40mg  daily as she has been doing. Cornelia states that her angina has not worsened and did not want to increase the imdur at this time. Advised that if the episodes recur, that the patient needs to go to ER so we can evaluate her while the episode is happening. Cornelia verbalized understanding and appreciated the call.

## 2016-05-29 NOTE — Addendum Note (Signed)
Addended by: Clarnce Flock E on: 05/29/2016 11:47 AM   Modules accepted: Orders

## 2016-05-29 NOTE — Telephone Encounter (Signed)
New Message.   Pt calling for testing results

## 2016-05-30 LAB — BASIC METABOLIC PANEL
BUN / CREAT RATIO: 16 (ref 12–28)
BUN: 14 mg/dL (ref 10–36)
CO2: 24 mmol/L (ref 18–29)
CREATININE: 0.9 mg/dL (ref 0.57–1.00)
Calcium: 9.8 mg/dL (ref 8.7–10.3)
Chloride: 99 mmol/L (ref 96–106)
GFR calc Af Amer: 62 mL/min/{1.73_m2} (ref >59)
GFR calc non Af Amer: 54 mL/min/{1.73_m2} — ABNORMAL LOW (ref >59)
Glucose: 104 mg/dL — ABNORMAL HIGH (ref 65–99)
Potassium: 3.9 mmol/L (ref 3.5–5.2)
SODIUM: 139 mmol/L (ref 134–144)

## 2016-05-30 LAB — CBC WITH DIFFERENTIAL/PLATELET
BASOS: 0 %
Basophils Absolute: 0 10*3/uL (ref 0.0–0.2)
EOS (ABSOLUTE): 0 10*3/uL (ref 0.0–0.4)
EOS: 0 %
Hematocrit: 39.1 % (ref 34.0–46.6)
Hemoglobin: 12.9 g/dL (ref 11.1–15.9)
IMMATURE GRANS (ABS): 0.1 10*3/uL (ref 0.0–0.1)
Immature Granulocytes: 1 %
LYMPHS ABS: 2.4 10*3/uL (ref 0.7–3.1)
Lymphs: 24 %
MCH: 30.1 pg (ref 26.6–33.0)
MCHC: 33 g/dL (ref 31.5–35.7)
MCV: 91 fL (ref 79–97)
MONOS ABS: 1.3 10*3/uL — AB (ref 0.1–0.9)
Monocytes: 13 %
Neutrophils Absolute: 6.2 10*3/uL (ref 1.4–7.0)
Neutrophils: 62 %
PLATELETS: 208 10*3/uL (ref 150–379)
RBC: 4.28 x10E6/uL (ref 3.77–5.28)
RDW: 13.2 % (ref 12.3–15.4)
WBC: 10 10*3/uL (ref 3.4–10.8)

## 2016-05-30 LAB — PRO B NATRIURETIC PEPTIDE: NT-PRO BNP: 411 pg/mL (ref 0–738)

## 2016-05-31 ENCOUNTER — Ambulatory Visit: Payer: Medicare Other | Admitting: Interventional Cardiology

## 2016-06-12 ENCOUNTER — Other Ambulatory Visit: Payer: Self-pay

## 2016-06-12 ENCOUNTER — Ambulatory Visit (HOSPITAL_COMMUNITY): Payer: Medicare Other | Attending: Cardiovascular Disease

## 2016-06-12 DIAGNOSIS — R232 Flushing: Secondary | ICD-10-CM | POA: Diagnosis not present

## 2016-06-12 DIAGNOSIS — R634 Abnormal weight loss: Secondary | ICD-10-CM | POA: Diagnosis not present

## 2016-06-12 DIAGNOSIS — R0601 Orthopnea: Secondary | ICD-10-CM | POA: Diagnosis not present

## 2016-06-12 DIAGNOSIS — I081 Rheumatic disorders of both mitral and tricuspid valves: Secondary | ICD-10-CM | POA: Diagnosis not present

## 2016-06-12 DIAGNOSIS — Z79890 Hormone replacement therapy (postmenopausal): Secondary | ICD-10-CM | POA: Diagnosis not present

## 2016-06-12 DIAGNOSIS — L270 Generalized skin eruption due to drugs and medicaments taken internally: Secondary | ICD-10-CM | POA: Diagnosis not present

## 2016-06-12 DIAGNOSIS — I2510 Atherosclerotic heart disease of native coronary artery without angina pectoris: Secondary | ICD-10-CM | POA: Diagnosis not present

## 2016-06-12 DIAGNOSIS — F341 Dysthymic disorder: Secondary | ICD-10-CM | POA: Diagnosis not present

## 2016-06-12 DIAGNOSIS — R630 Anorexia: Secondary | ICD-10-CM | POA: Diagnosis not present

## 2016-06-12 DIAGNOSIS — R0600 Dyspnea, unspecified: Secondary | ICD-10-CM | POA: Diagnosis not present

## 2016-06-12 DIAGNOSIS — R0602 Shortness of breath: Secondary | ICD-10-CM

## 2016-06-14 ENCOUNTER — Encounter: Payer: Self-pay | Admitting: Interventional Cardiology

## 2016-06-14 ENCOUNTER — Ambulatory Visit (INDEPENDENT_AMBULATORY_CARE_PROVIDER_SITE_OTHER): Payer: Medicare Other | Admitting: Interventional Cardiology

## 2016-06-14 VITALS — BP 110/68 | HR 98 | Ht 66.0 in | Wt 144.0 lb

## 2016-06-14 DIAGNOSIS — R9439 Abnormal result of other cardiovascular function study: Secondary | ICD-10-CM

## 2016-06-14 DIAGNOSIS — R0602 Shortness of breath: Secondary | ICD-10-CM | POA: Diagnosis not present

## 2016-06-14 DIAGNOSIS — I10 Essential (primary) hypertension: Secondary | ICD-10-CM | POA: Diagnosis not present

## 2016-06-14 DIAGNOSIS — R931 Abnormal findings on diagnostic imaging of heart and coronary circulation: Secondary | ICD-10-CM | POA: Insufficient documentation

## 2016-06-14 NOTE — Patient Instructions (Signed)
Medication Instructions:  Your physician recommends that you continue on your current medications as directed. Please refer to the Current Medication list given to you today.   Labwork: None ordered today.  Testing/Procedures: None ordered today.  Follow-Up: Your physician wants you to follow-up in: 6 months with Dr. Irish Lack. You will receive a reminder letter in the mail two months in advance. If you don't receive a letter, please call our office to schedule the follow-up appointment.   Any Other Special Instructions Will Be Listed Below (If Applicable).     If you need a refill on your cardiac medications before your next appointment, please call your pharmacy.

## 2016-06-14 NOTE — Progress Notes (Signed)
Patient ID: [name], female   DOB: 1917/01/20, 83 y.o.   MRN: [id number]     Cardiology Office Note   Date:  06/14/2016   ID:  [name], DOB 1916-05-24, MRN [id number]  PCP:  Josetta Huddle, MD    No chief complaint on file. SHOB, abnormal stress test   Wt Readings from Last 3 Encounters:  06/14/16 144 lb (65.3 kg)  05/28/16 145 lb 12 oz (66.1 kg)  10/04/15 146 lb 12.8 oz (66.6 kg)       History of Present Illness: [name] is a 83 y.o. female  who had an abnormal stress test several years ago.  She has been medically managed for this. SHe has DOE when she walks fast. Walking is limited by back and foot pain. No chest pain with activity.   She was hospitalized in August 2016 for UTI which led to Escherichia coli bacteremia. Since that time, she does not feel that she has returned to normal.   She was seen last week for [facility].  She denies any lower extremity swelling. No change in her weight. She is not feel like she is retaining fluid. She just feels like she is more short of breath than usual. She sleeps on one pillow. She is not having to sit up to catch her breath. She had lab work earlier today. We worked her in to see her due to her symptoms of shortness of breath.  Blood pressure has been well controlled.  Echo in 5/18: Left ventricle: Abnormal septal motion. The cavity size was   mildly dilated. Wall thickness was normal. Systolic function was   mildly reduced. The estimated ejection fraction was in the range   of 45% to 50%. Doppler parameters are consistent with both   elevated ventricular end-diastolic filling pressure and elevated   left atrial filling pressure. - Mitral valve: There was mild regurgitation. - Atrial septum: No defect or patent foramen ovale was identified. - Tricuspid valve: There was mild-moderate regurgitation. - Pulmonary arteries: PA peak pressure: 42 mm Hg (S).   She continues to drink soda on a regular basis. She  also has an occasional cocktail.       Past Medical History:  Diagnosis Date  . Allergic rhinitis   . CAD (coronary artery disease)   . CHF (congestive heart failure) ([place])   . CKD (chronic kidney disease) stage 3, GFR 30-59 ml/min 09/12/2014  . Diverticular disease   . Diverticulosis   . DJD (degenerative joint disease) of knee   . DJD (degenerative joint disease) of lumbar spine    scoliosis  . Eczema   . Esophageal erosions    from nsaid   . GI bleed   . Hormone replacement therapy (postmenopausal)   . Hyperglycemia   . Hyperlipidemia   . Hypertension   . Pneumonia    july 2009  . Pulmonary hypertension ([place])    a. 2D echo 03/2008 showed normal biventricular thickness, chamber size and systolic function, EF 51-88%, mild mitral regurgitation, aortic sclerosis, moderate pulmonary HTN, pulmonic regurgitation.  Marland Kitchen PVC's (premature ventricular contractions)   . Reactive depression (situational)   . Sciatica    right lower extremity  . Shingles    right thoracic 2008  . Vertigo     Past Surgical History:  Procedure Laterality Date  . APPENDECTOMY    . BACK SURGERY    . BREAST LUMPECTOMY    . FLEXIBLE SIGMOIDOSCOPY Left 08/13/2013   Procedure: FLEXIBLE  SIGMOIDOSCOPY;  Surgeon: Arta Silence, MD;  Location: [facility];  Service: Endoscopy;  Laterality: Left;  . TOTAL ABDOMINAL HYSTERECTOMY       Current Outpatient Prescriptions  Medication Sig Dispense Refill  . acetaminophen (TYLENOL) 500 MG tablet Take 1,000 mg by mouth every 6 (six) hours as needed for moderate pain.    Marland Kitchen aspirin EC 81 MG tablet Take 81 mg by mouth once.     Marland Kitchen atenolol (TENORMIN) 25 MG tablet Take by mouth daily.    . clotrimazole (LOTRIMIN) 1 % cream Apply 1 application topically as needed (foot -- infection/rash).     Marland Kitchen estrogens, conjugated, (PREMARIN) 0.3 MG tablet Take 0.3 mg by mouth every morning.     . furosemide (LASIX) 40 MG tablet Take 40 mg by mouth every other day.     . isosorbide  mononitrate (IMDUR) 30 MG 24 hr tablet Take 1 tablet (30 mg total) by mouth daily. 90 tablet 3  . loperamide (IMODIUM) 2 MG capsule Take 2 mg by mouth daily.  0  . meclizine (ANTIVERT) 25 MG tablet Take 25 mg by mouth 2 (two) times daily as needed for dizziness or nausea.     . Polyvinyl Alcohol-Povidone (REFRESH OP) Apply 1 drop to eye 2 (two) times daily.    . potassium chloride SA (K-DUR,KLOR-CON) 20 MEQ tablet Take 20 mEq by mouth 2 (two) times daily.  0  . Prenatal Vit-Fe Fumarate-FA (PRENATAL PO) Take 1 tablet by mouth daily.    Marland Kitchen venlafaxine XR (EFFEXOR-XR) 75 MG 24 hr capsule Take 75 mg by mouth daily.  0   No current facility-administered medications for this visit.     Allergies:   Ace inhibitors; Streptomycin; Tramadol; and Penicillins    Social History:  The patient  reports that she has never smoked. She has never used smokeless tobacco. She reports that she drinks alcohol. She reports that she does not use drugs.   Family History:  The patient's family history includes Cancer in her brother; Heart attack in her brother, father, mother, and sister; Heart disease in her daughter, father, mother, sister, and son; Hypertension in her brother.    ROS:  Please see the history of present illness.   Otherwise, review of systems are positive for shortness of breath as noted above.   All other systems are reviewed and negative.    PHYSICAL EXAM: VS:  BP 110/68   Pulse 98   Ht 5\' 6"  (1.676 m)   Wt 144 lb (65.3 kg)   SpO2 96%   BMI 23.24 kg/m  , BMI Body mass index is 23.24 kg/m. GEN: Well nourished, well developed, in no acute distress  HEENT: normal  Neck: no JVD, carotid bruits, or masses Cardiac: Premature beats, otherwise RRR; no murmurs, rubs, or gallops, minimal bilateral ankle edema  Respiratory:  clear to auscultation bilaterally, normal work of breathing GI: soft, nontender, nondistended, + BS MS: no deformity or atrophy  Skin: warm and dry, no rash Neuro:  Strength  and sensation are intact Psych: euthymic mood, full affect   EKG:   The ekg ordered today demonstrates normal sinus rhythm, PVCs   Recent Labs: 01/25/2016: ALT 16; Hemoglobin 14.2 05/28/2016: BUN 14; Creatinine, Ser 0.90; NT-Pro BNP 411; Platelets 208; Potassium 3.9; Sodium 139   Lipid Panel No results found for: CHOL, TRIG, HDL, CHOLHDL, VLDL, LDLCALC, LDLDIRECT   Other studies Reviewed: Additional studies/ records that were reviewed today with results demonstrating: prior stress test result as noted.  ASSESSMENT AND PLAN:  1.  Abnormal stress test: Lateral defect noted several years ago. She has had her angina controlled medically.  I don't think her current symptoms are ischemia related. No changes to her ECG. 2. Shortness of breath: Likely multifactorial. EF has decreased slightly.  Given age, would not pursue ischemic w/u. She feels SHOB at times waking up- better with her head up.  She does not want to take more Lasix.  She will get up for a while and feel better.   BNP was unremarkable.  Continue Lasix .  Watch daily weights.  Increase Lasix if she reports her weight going up.  She states weight has been stable.  Decrease soda intake to minimize sodium intake.  She likes to consume the beverages she enjoys. It is okay if she has alcohol occasionally as well. 3. Hypertension: Continue current blood pressure lowering therapy. Blood pressure well controlled today.   4. She gave up driving a few years ago.  5. Counseled to decrease sodium intake and follow the previously given fluid restriction as much as possible.    Current medicines are reviewed at length with the patient today.  The patient concerns regarding her medicines were addressed.  The following changes have been made:  No change  Labs/ tests ordered today include:  No orders of the defined types were placed in this encounter.   Recommend 150 minutes/week of aerobic exercise Low fat, low carb, high fiber diet  recommended  Disposition:   FU in 6 months   Signed, Larae Grooms, MD  06/14/2016 1:28 PM    [facility] [place], [place], [place]  [address] Phone: [phone]; Fax: [phone]

## 2016-07-05 DIAGNOSIS — M79641 Pain in right hand: Secondary | ICD-10-CM | POA: Diagnosis not present

## 2016-07-10 DIAGNOSIS — I5032 Chronic diastolic (congestive) heart failure: Secondary | ICD-10-CM | POA: Diagnosis not present

## 2016-07-10 DIAGNOSIS — F322 Major depressive disorder, single episode, severe without psychotic features: Secondary | ICD-10-CM | POA: Diagnosis not present

## 2016-07-10 DIAGNOSIS — Z79899 Other long term (current) drug therapy: Secondary | ICD-10-CM | POA: Diagnosis not present

## 2016-07-10 DIAGNOSIS — M79641 Pain in right hand: Secondary | ICD-10-CM | POA: Diagnosis not present

## 2016-07-10 DIAGNOSIS — Z0000 Encounter for general adult medical examination without abnormal findings: Secondary | ICD-10-CM | POA: Diagnosis not present

## 2016-07-10 DIAGNOSIS — I2510 Atherosclerotic heart disease of native coronary artery without angina pectoris: Secondary | ICD-10-CM | POA: Diagnosis not present

## 2016-07-10 DIAGNOSIS — F329 Major depressive disorder, single episode, unspecified: Secondary | ICD-10-CM | POA: Diagnosis not present

## 2016-07-10 DIAGNOSIS — E7800 Pure hypercholesterolemia, unspecified: Secondary | ICD-10-CM | POA: Diagnosis not present

## 2016-07-10 DIAGNOSIS — L52 Erythema nodosum: Secondary | ICD-10-CM | POA: Diagnosis not present

## 2016-07-10 DIAGNOSIS — Z1389 Encounter for screening for other disorder: Secondary | ICD-10-CM | POA: Diagnosis not present

## 2016-07-10 DIAGNOSIS — J309 Allergic rhinitis, unspecified: Secondary | ICD-10-CM | POA: Diagnosis not present

## 2016-07-10 DIAGNOSIS — K219 Gastro-esophageal reflux disease without esophagitis: Secondary | ICD-10-CM | POA: Diagnosis not present

## 2016-07-10 DIAGNOSIS — I10 Essential (primary) hypertension: Secondary | ICD-10-CM | POA: Diagnosis not present

## 2016-07-24 DIAGNOSIS — M79641 Pain in right hand: Secondary | ICD-10-CM | POA: Diagnosis not present

## 2016-07-24 DIAGNOSIS — R232 Flushing: Secondary | ICD-10-CM | POA: Diagnosis not present

## 2016-07-24 DIAGNOSIS — R350 Frequency of micturition: Secondary | ICD-10-CM | POA: Diagnosis not present

## 2016-07-24 DIAGNOSIS — L52 Erythema nodosum: Secondary | ICD-10-CM | POA: Diagnosis not present

## 2016-07-24 DIAGNOSIS — R700 Elevated erythrocyte sedimentation rate: Secondary | ICD-10-CM | POA: Diagnosis not present

## 2016-07-24 DIAGNOSIS — R634 Abnormal weight loss: Secondary | ICD-10-CM | POA: Diagnosis not present

## 2016-07-24 DIAGNOSIS — R630 Anorexia: Secondary | ICD-10-CM | POA: Diagnosis not present

## 2016-07-24 DIAGNOSIS — N390 Urinary tract infection, site not specified: Secondary | ICD-10-CM | POA: Diagnosis not present

## 2016-07-24 DIAGNOSIS — Z79890 Hormone replacement therapy (postmenopausal): Secondary | ICD-10-CM | POA: Diagnosis not present

## 2016-08-04 ENCOUNTER — Encounter (HOSPITAL_COMMUNITY): Payer: Self-pay | Admitting: Emergency Medicine

## 2016-08-04 ENCOUNTER — Emergency Department (HOSPITAL_COMMUNITY)
Admission: EM | Admit: 2016-08-04 | Discharge: 2016-08-05 | Disposition: A | Discharge status: Home / Self Care | Payer: Medicare Other | Attending: Emergency Medicine | Admitting: Emergency Medicine

## 2016-08-04 ENCOUNTER — Emergency Department (HOSPITAL_COMMUNITY): Payer: Medicare Other

## 2016-08-04 DIAGNOSIS — M47814 Spondylosis without myelopathy or radiculopathy, thoracic region: Secondary | ICD-10-CM | POA: Diagnosis not present

## 2016-08-04 DIAGNOSIS — R110 Nausea: Secondary | ICD-10-CM | POA: Insufficient documentation

## 2016-08-04 DIAGNOSIS — Z79899 Other long term (current) drug therapy: Secondary | ICD-10-CM | POA: Insufficient documentation

## 2016-08-04 DIAGNOSIS — I2510 Atherosclerotic heart disease of native coronary artery without angina pectoris: Secondary | ICD-10-CM | POA: Diagnosis not present

## 2016-08-04 DIAGNOSIS — R1011 Right upper quadrant pain: Secondary | ICD-10-CM | POA: Diagnosis not present

## 2016-08-04 DIAGNOSIS — R300 Dysuria: Secondary | ICD-10-CM | POA: Diagnosis not present

## 2016-08-04 DIAGNOSIS — N183 Chronic kidney disease, stage 3 (moderate): Secondary | ICD-10-CM | POA: Diagnosis not present

## 2016-08-04 DIAGNOSIS — I130 Hypertensive heart and chronic kidney disease with heart failure and stage 1 through stage 4 chronic kidney disease, or unspecified chronic kidney disease: Secondary | ICD-10-CM | POA: Diagnosis not present

## 2016-08-04 DIAGNOSIS — G8929 Other chronic pain: Secondary | ICD-10-CM | POA: Diagnosis not present

## 2016-08-04 DIAGNOSIS — R21 Rash and other nonspecific skin eruption: Secondary | ICD-10-CM | POA: Insufficient documentation

## 2016-08-04 DIAGNOSIS — R0602 Shortness of breath: Secondary | ICD-10-CM | POA: Insufficient documentation

## 2016-08-04 DIAGNOSIS — R52 Pain, unspecified: Secondary | ICD-10-CM

## 2016-08-04 DIAGNOSIS — Z7982 Long term (current) use of aspirin: Secondary | ICD-10-CM | POA: Insufficient documentation

## 2016-08-04 DIAGNOSIS — I509 Heart failure, unspecified: Secondary | ICD-10-CM | POA: Insufficient documentation

## 2016-08-04 DIAGNOSIS — R109 Unspecified abdominal pain: Secondary | ICD-10-CM | POA: Diagnosis not present

## 2016-08-04 DIAGNOSIS — R0789 Other chest pain: Secondary | ICD-10-CM | POA: Insufficient documentation

## 2016-08-04 DIAGNOSIS — M546 Pain in thoracic spine: Secondary | ICD-10-CM | POA: Diagnosis not present

## 2016-08-04 LAB — URINALYSIS, ROUTINE W REFLEX MICROSCOPIC
Bilirubin Urine: NEGATIVE
Glucose, UA: NEGATIVE mg/dL
Hgb urine dipstick: NEGATIVE
Ketones, ur: NEGATIVE mg/dL
LEUKOCYTES UA: NEGATIVE
NITRITE: NEGATIVE
PROTEIN: NEGATIVE mg/dL
Specific Gravity, Urine: 1.013 (ref 1.005–1.030)
pH: 5 (ref 5.0–8.0)

## 2016-08-04 LAB — COMPREHENSIVE METABOLIC PANEL
ALK PHOS: 70 U/L (ref 38–126)
ALT: 12 U/L — AB (ref 14–54)
AST: 24 U/L (ref 15–41)
Albumin: 2.9 g/dL — ABNORMAL LOW (ref 3.5–5.0)
Anion gap: 11 (ref 5–15)
BUN: 12 mg/dL (ref 6–20)
CALCIUM: 8.7 mg/dL — AB (ref 8.9–10.3)
CO2: 22 mmol/L (ref 22–32)
CREATININE: 0.97 mg/dL (ref 0.44–1.00)
Chloride: 106 mmol/L (ref 101–111)
GFR, EST AFRICAN AMERICAN: 55 mL/min — AB (ref >60)
GFR, EST NON AFRICAN AMERICAN: 47 mL/min — AB (ref >60)
Glucose, Bld: 104 mg/dL — ABNORMAL HIGH (ref 65–99)
Potassium: 3.5 mmol/L (ref 3.5–5.1)
Sodium: 139 mmol/L (ref 135–145)
Total Bilirubin: 0.5 mg/dL (ref 0.3–1.2)
Total Protein: 6.6 g/dL (ref 6.5–8.1)

## 2016-08-04 LAB — CBC
HCT: 34.7 % — ABNORMAL LOW (ref 36.0–46.0)
Hemoglobin: 11.4 g/dL — ABNORMAL LOW (ref 12.0–15.0)
MCH: 29.5 pg (ref 26.0–34.0)
MCHC: 32.9 g/dL (ref 30.0–36.0)
MCV: 89.9 fL (ref 78.0–100.0)
PLATELETS: 188 10*3/uL (ref 150–400)
RBC: 3.86 MIL/uL — AB (ref 3.87–5.11)
RDW: 14.9 % (ref 11.5–15.5)
WBC: 11.6 10*3/uL — AB (ref 4.0–10.5)

## 2016-08-04 LAB — BRAIN NATRIURETIC PEPTIDE: B NATRIURETIC PEPTIDE 5: 62.8 pg/mL (ref 0.0–100.0)

## 2016-08-04 LAB — I-STAT TROPONIN, ED: TROPONIN I, POC: 0.02 ng/mL (ref 0.00–0.08)

## 2016-08-04 MED ORDER — HYDROCODONE-ACETAMINOPHEN 5-325 MG PO TABS
1.0000 | ORAL_TABLET | Freq: Once | ORAL | Status: AC
  Administered 2016-08-04: 1 via ORAL
  Filled 2016-08-04: qty 1

## 2016-08-04 NOTE — ED Provider Notes (Signed)
[facility] DEPT Provider Note   CSN: [id number] Arrival date & time: 08/04/16  1956     History   Chief Complaint Chief Complaint  Patient presents with  . Flank Pain  . Recurrent UTI    HPI [name] is a 83 y.o. female.  83yo F w/ PMH including CHF, CKD, DJD,  pulmonary hypertension who presents with multiple complaints. She has had 1 month of intermittent right side/upper abdomen pain that is worse with movement and feels like a muscle soreness. No falls, trauma, or change in physical activity. No association with eating. She has taken aleve for it before, no medications today. She reports intermittent nausea but no vomiting. She reports some mild dysuria yesterday. She denies any fevers. She has had some shortness of breath. She has seen her PCP and cardiologist in the past for this; saw cardiology in May. She states her current SOB is not as bad as it was then. She started having red areas and burning on various places on fingers, ankle and shoulder ~1 month ago. Symptoms resolved with prednisone but then began coming back on her fingers. A few weeks ago she saw her PCP who prescribed her prednisone taper and keflex to treat "infection." Redness on her hands started getting worse again today. She reports intermittent chest pain for 2-3 months.    The history is provided by the patient.  Flank Pain     Past Medical History:  Diagnosis Date  . Allergic rhinitis   . CAD (coronary artery disease)   . CHF (congestive heart failure) ([place])   . CKD (chronic kidney disease) stage 3, GFR 30-59 ml/min 09/12/2014  . Diverticular disease   . Diverticulosis   . DJD (degenerative joint disease) of knee   . DJD (degenerative joint disease) of lumbar spine    scoliosis  . Eczema   . Esophageal erosions    from nsaid   . GI bleed   . Hormone replacement therapy (postmenopausal)   . Hyperglycemia   . Hyperlipidemia   . Hypertension   . Pneumonia    july 2009  . Pulmonary  hypertension ([place])    a. 2D echo 03/2008 showed normal biventricular thickness, chamber size and systolic function, EF 37-85%, mild mitral regurgitation, aortic sclerosis, moderate pulmonary HTN, pulmonic regurgitation.  Marland Kitchen PVC's (premature ventricular contractions)   . Reactive depression (situational)   . Sciatica    right lower extremity  . Shingles    right thoracic 2008  . Vertigo     Patient Active Problem List   Diagnosis Date Noted  . Abnormal echocardiogram 06/14/2016  . PVC's (premature ventricular contractions)   . Bacteremia 09/18/2014  . Fever   . Febrile illness 09/12/2014  . Diverticulitis 09/12/2014  . CKD (chronic kidney disease) stage 3, GFR 30-59 ml/min 09/12/2014  . HTN (hypertension) 09/12/2014  . Thrombocytopenia ([place]) 09/12/2014  . Abnormal cardiovascular stress test 01/13/2014  . Rectal bleed 08/10/2013  . Acute renal failure ([place]) 08/10/2013  . Essential hypertension, benign 01/08/2013  . DOE (dyspnea on exertion) 01/08/2013    Past Surgical History:  Procedure Laterality Date  . APPENDECTOMY    . BACK SURGERY    . BREAST LUMPECTOMY    . FLEXIBLE SIGMOIDOSCOPY Left 08/13/2013   Procedure: FLEXIBLE SIGMOIDOSCOPY;  Surgeon: Arta Silence, MD;  Location: [facility];  Service: Endoscopy;  Laterality: Left;  . TOTAL ABDOMINAL HYSTERECTOMY      OB History    No data available  Home Medications    Prior to Admission medications   Medication Sig Start Date End Date Taking? Authorizing Provider  acetaminophen (TYLENOL) 500 MG tablet Take 1,000 mg by mouth every 6 (six) hours as needed for moderate pain.    [provider]  aspirin EC 81 MG tablet Take 81 mg by mouth once.     [provider]  atenolol (TENORMIN) 25 MG tablet Take by mouth daily.    [provider]  clotrimazole (LOTRIMIN) 1 % cream Apply 1 application topically as needed (foot -- infection/rash).     [provider]  estrogens, conjugated,  (PREMARIN) 0.3 MG tablet Take 0.3 mg by mouth every morning.     [provider]  furosemide (LASIX) 40 MG tablet Take 40 mg by mouth every other day.     [provider]  isosorbide mononitrate (IMDUR) 30 MG 24 hr tablet Take 1 tablet (30 mg total) by mouth daily. 10/10/15   Jettie Booze, MD  loperamide (IMODIUM) 2 MG capsule Take 2 mg by mouth daily. 01/08/16   [provider]  meclizine (ANTIVERT) 25 MG tablet Take 25 mg by mouth 2 (two) times daily as needed for dizziness or nausea.     [provider]  Polyvinyl Alcohol-Povidone (REFRESH OP) Apply 1 drop to eye 2 (two) times daily.    [provider]  potassium chloride SA (K-DUR,KLOR-CON) 20 MEQ tablet Take 20 mEq by mouth 2 (two) times daily. 11/28/15   [provider]  predniSONE (STERAPRED UNI-PAK 21 TAB) 10 MG (21) TBPK tablet Take by mouth daily. Take 6 tabs by mouth daily  for 2 days, then 5 tabs for 2 days, then 4 tabs for 2 days, then 3 tabs for 2 days, 2 tabs for 2 days, then 1 tab by mouth daily for 2 days 08/05/16   Reine Bristow, Wenda Overland, MD  Prenatal Vit-Fe Fumarate-FA (PRENATAL PO) Take 1 tablet by mouth daily.    [provider]  venlafaxine XR (EFFEXOR-XR) 75 MG 24 hr capsule Take 75 mg by mouth daily. 01/08/16   [provider]    Family History Family History  Problem Relation Age of Onset  . Heart disease Father   . Heart attack Father   . Heart disease Mother   . Heart attack Mother   . Heart disease Daughter   . Heart disease Son   . Cancer Brother   . Heart disease Sister   . Heart attack Sister        X2  . Heart attack Brother   . Hypertension Brother   . Stroke Neg Hx     Social History Social History  Substance Use Topics  . Smoking status: Never Smoker  . Smokeless tobacco: Never Used  . Alcohol use Yes     Comment: occasional cocktail     Allergies   Ace inhibitors; Streptomycin; Tramadol; Vesicare [solifenacin  succinate]; and Penicillins   Review of Systems Review of Systems  Genitourinary: Positive for flank pain.     Physical Exam Updated Vital Signs BP (!) 147/76   Pulse 89   Temp 98.1 F (36.7 C) (Oral)   Resp 20   Ht 5\' 6"  (1.676 m)   Wt 62.3 kg (137 lb 6 oz)   SpO2 98%   BMI 22.17 kg/m   Physical Exam  Constitutional: She is oriented to person, place, and time. She appears well-developed.  Frail, elderly woman in no acute distress  HENT:  Head: Normocephalic and atraumatic.  Moist mucous membranes  Eyes: Conjunctivae are normal. Pupils are equal, round, and reactive to light.  Neck: Neck supple.  Cardiovascular: Normal rate, regular rhythm and normal heart sounds.   No murmur heard. Pulmonary/Chest: Effort normal and breath sounds normal.  Abdominal: Soft. Bowel sounds are normal. She exhibits no distension. There is no tenderness.  Musculoskeletal: She exhibits no edema.  Tenderness to light palpation of R lateral chest wall around to R thoracic back with no crepitus or deformity  Neurological: She is alert and oriented to person, place, and time.  Fluent speech  Skin: Skin is warm and dry. Rash noted.  Discrete areas of tender erythema scattered on MCP and PIP joints on b/l hands and on right heel, non-palpable  Psychiatric: She has a normal mood and affect. Judgment normal.  Nursing note and vitals reviewed.    ED Treatments / Results  Labs (all labs ordered are listed, but only abnormal results are displayed) Labs Reviewed  COMPREHENSIVE METABOLIC PANEL - Abnormal; Notable for the following:       Result Value   Glucose, Bld 104 (*)    Calcium 8.7 (*)    Albumin 2.9 (*)    ALT 12 (*)    GFR calc non Af Amer 47 (*)    GFR calc Af Amer 55 (*)    All other components within normal limits  CBC - Abnormal; Notable for the following:    WBC 11.6 (*)    RBC 3.86 (*)    Hemoglobin 11.4 (*)    HCT 34.7 (*)    All other components within normal limits    URINALYSIS, ROUTINE W REFLEX MICROSCOPIC - Abnormal; Notable for the following:    APPearance HAZY (*)    All other components within normal limits  BRAIN NATRIURETIC PEPTIDE  I-STAT TROPOININ, ED    EKG  EKG Interpretation None       Radiology Dg Ribs Unilateral W/chest Right  Result Date: 08/04/2016 CLINICAL DATA:  Initial evaluation for right flank pain for 1 month radiating to back. No injury. EXAM: RIGHT RIBS AND CHEST - 3+ VIEW COMPARISON:  Prior radiograph from 05/28/2016. FINDINGS: Cardiac and mediastinal silhouettes stable in size and contour, and remain within normal limits. Lungs normally inflated. No focal infiltrates. No pulmonary edema or pleural effusion. No pneumothorax. Metallic BB marker overlies the lower right ribs at site of pain. No acute displaced rib fracture identified. No other acute osseous abnormality. IMPRESSION: 1. No acute displaced right-sided rib fracture identified. 2. No other active cardiopulmonary disease. Electronically Signed   By: Jeannine Boga M.D.   On: 08/04/2016 22:35   Dg Thoracic Spine 2 View  Result Date: 08/04/2016 CLINICAL DATA:  Initial evaluation for acute right flank pain for 1 month radiating to back. EXAM: THORACIC SPINE 2 VIEWS COMPARISON:  Prior radiograph from 09/12/2014. FINDINGS: Focal dextroscoliosis of the upper lumbar spine with apex at L2 with associated severe degenerative spondylolysis, grossly similar to previous. Vertebral bodies otherwise normally aligned with preservation of the normal thoracic kyphosis. No acute fracture identified. Multilevel degenerative endplate spurring seen throughout the thoracic spine. Degenerative changes noted within the partially visualized cervical spine as well. No acute soft tissue abnormality. IMPRESSION: 1. No radiographic evidence for acute abnormality within the thoracic spine. 2. Dextroscoliosis of the upper lumbar spine with associated severe degenerative spondylolysis, grossly  similar to previous. Electronically Signed   By: Jeannine Boga M.D.   On: 08/04/2016 22:32  Procedures Procedures (including critical care time)  Medications Ordered in ED Medications  HYDROcodone-acetaminophen (NORCO/VICODIN) 5-325 MG per tablet 1 tablet (1 tablet Oral Given 08/04/16 2221)     Initial Impression / Assessment and Plan / ED Course  I have reviewed the triage vital signs and the nursing notes.  Pertinent labs & imaging results that were available during my care of the patient were reviewed by me and considered in my medical decision making (see chart for details).    Pt here w/ multiple complaints Including intermittent rash over the past month that has improved with steroids and returned after completion of steroids.She was comfortable on exam. Her exam seems consistent with musculoskeletal etiology as she has not had many other associated symptoms related to her right side pain and it is worse with movement. However, I did obtain rib x-rays and thoracic back x-ray, both of which did not show any acute findings. She does have severe degenerative changes in her back and has a follow-up appointment with her back specialist this week.   Her SOB sx are chronic, today her labs are reassuring w/ normal trop and BNP, normal cr. I have discussed with her starting back on steroids as her rash symptoms seemed to have reappeared after completion of steroids and I suspect underlying autoimmune process rather than an infectious process. I've instructed her to follow-up closely with her PCP for further evaluation of this problem. Extensively reviewed return precautions including fever or any worsening symptoms. Patient voiced understanding and was discharged in satisfactory condition.  Final Clinical Impressions(s) / ED Diagnoses   Final diagnoses:  Right-sided chest wall pain  Chronic thoracic back pain, unspecified back pain laterality  Rash and nonspecific skin eruption     New Prescriptions New Prescriptions   PREDNISONE (STERAPRED UNI-PAK 21 TAB) 10 MG (21) TBPK TABLET    Take by mouth daily. Take 6 tabs by mouth daily  for 2 days, then 5 tabs for 2 days, then 4 tabs for 2 days, then 3 tabs for 2 days, 2 tabs for 2 days, then 1 tab by mouth daily for 2 days     Amauria Younts, Wenda Overland, MD 08/05/16 0020

## 2016-08-04 NOTE — ED Triage Notes (Signed)
[name] for continued dysuria , flank/abdomilnal pain with movement after finishing prednisone and Keflex for UTI / general infection. Patient a/o x4. States pain is only with movement, describes it as soreness.

## 2016-08-04 NOTE — ED Notes (Signed)
Patient reports flank and upper abdominal pain with movement. Pt describes it as muscle soreness. Pt also has pain to touch in rt heel with redness. Redness in hands and feet that pt states, improved while on prednisone, but is now returning.

## 2016-08-04 NOTE — ED Notes (Signed)
Bed: PS88 Expected date:  Expected time:  Means of arrival:  Comments: 83 yo F/muscle aches

## 2016-08-05 MED ORDER — PREDNISONE 10 MG (21) PO TBPK: ORAL_TABLET | Freq: Every day | ORAL | 0 refills | Status: DC

## 2016-08-05 NOTE — Discharge Instructions (Signed)
THE RASH ON YOUR HANDS AND FEET DOES NOT LOOK LIKE AN INFECTION. BECAUSE IT HAS IMPROVED WITH STEROIDS AND GOTTEN WORSE OFF STEROIDS, I SUSPECT THAT IT IS RELATED TO YOUR IMMUNE SYSTEM. WE WILL RESTART YOU ON A STEROID TAPER TO IMPROVE YOUR SYMPTOMS. FOLLOW UP WITH YOUR PRIMARY CARE PROVIDER ABOUT THIS RASH AND CONSIDER REFERRAL TO A RHEUMATOLOGIST OR DERMATOLOGIST. RETURN TO ER IF YOU DEVELOP FEVER OR WORSENING RASH.

## 2016-08-07 DIAGNOSIS — M48062 Spinal stenosis, lumbar region with neurogenic claudication: Secondary | ICD-10-CM | POA: Diagnosis not present

## 2016-08-07 DIAGNOSIS — M5416 Radiculopathy, lumbar region: Secondary | ICD-10-CM | POA: Diagnosis not present

## 2016-08-08 ENCOUNTER — Encounter (HOSPITAL_COMMUNITY): Payer: Self-pay | Admitting: Radiology

## 2016-08-08 ENCOUNTER — Inpatient Hospital Stay (HOSPITAL_COMMUNITY)
Admission: EM | Admit: 2016-08-08 | Discharge: 2016-08-12 | DRG: 378 | Disposition: A | Discharge status: Skilled Nursing Facility | Payer: Medicare Other | Attending: Internal Medicine | Admitting: Internal Medicine

## 2016-08-08 ENCOUNTER — Emergency Department (HOSPITAL_COMMUNITY): Payer: Medicare Other

## 2016-08-08 DIAGNOSIS — I272 Pulmonary hypertension, unspecified: Secondary | ICD-10-CM | POA: Diagnosis present

## 2016-08-08 DIAGNOSIS — I509 Heart failure, unspecified: Secondary | ICD-10-CM | POA: Diagnosis present

## 2016-08-08 DIAGNOSIS — Z79899 Other long term (current) drug therapy: Secondary | ICD-10-CM

## 2016-08-08 DIAGNOSIS — M1990 Unspecified osteoarthritis, unspecified site: Secondary | ICD-10-CM | POA: Diagnosis present

## 2016-08-08 DIAGNOSIS — I080 Rheumatic disorders of both mitral and aortic valves: Secondary | ICD-10-CM | POA: Diagnosis present

## 2016-08-08 DIAGNOSIS — D62 Acute posthemorrhagic anemia: Secondary | ICD-10-CM | POA: Diagnosis present

## 2016-08-08 DIAGNOSIS — M545 Low back pain: Secondary | ICD-10-CM | POA: Diagnosis not present

## 2016-08-08 DIAGNOSIS — M419 Scoliosis, unspecified: Secondary | ICD-10-CM | POA: Diagnosis present

## 2016-08-08 DIAGNOSIS — F329 Major depressive disorder, single episode, unspecified: Secondary | ICD-10-CM | POA: Diagnosis present

## 2016-08-08 DIAGNOSIS — M5117 Intervertebral disc disorders with radiculopathy, lumbosacral region: Secondary | ICD-10-CM | POA: Diagnosis present

## 2016-08-08 DIAGNOSIS — R2681 Unsteadiness on feet: Secondary | ICD-10-CM | POA: Diagnosis not present

## 2016-08-08 DIAGNOSIS — Z888 Allergy status to other drugs, medicaments and biological substances status: Secondary | ICD-10-CM

## 2016-08-08 DIAGNOSIS — E872 Acidosis, unspecified: Secondary | ICD-10-CM

## 2016-08-08 DIAGNOSIS — I2510 Atherosclerotic heart disease of native coronary artery without angina pectoris: Secondary | ICD-10-CM | POA: Diagnosis present

## 2016-08-08 DIAGNOSIS — N183 Chronic kidney disease, stage 3 unspecified: Secondary | ICD-10-CM | POA: Diagnosis present

## 2016-08-08 DIAGNOSIS — I130 Hypertensive heart and chronic kidney disease with heart failure and stage 1 through stage 4 chronic kidney disease, or unspecified chronic kidney disease: Secondary | ICD-10-CM | POA: Diagnosis present

## 2016-08-08 DIAGNOSIS — Z7982 Long term (current) use of aspirin: Secondary | ICD-10-CM

## 2016-08-08 DIAGNOSIS — Z79890 Hormone replacement therapy (postmenopausal): Secondary | ICD-10-CM

## 2016-08-08 DIAGNOSIS — R278 Other lack of coordination: Secondary | ICD-10-CM | POA: Diagnosis not present

## 2016-08-08 DIAGNOSIS — R2689 Other abnormalities of gait and mobility: Secondary | ICD-10-CM | POA: Diagnosis present

## 2016-08-08 DIAGNOSIS — K922 Gastrointestinal hemorrhage, unspecified: Secondary | ICD-10-CM | POA: Diagnosis present

## 2016-08-08 DIAGNOSIS — I10 Essential (primary) hypertension: Secondary | ICD-10-CM | POA: Diagnosis not present

## 2016-08-08 DIAGNOSIS — R41841 Cognitive communication deficit: Secondary | ICD-10-CM | POA: Diagnosis not present

## 2016-08-08 DIAGNOSIS — R42 Dizziness and giddiness: Secondary | ICD-10-CM | POA: Diagnosis not present

## 2016-08-08 DIAGNOSIS — K921 Melena: Principal | ICD-10-CM | POA: Diagnosis present

## 2016-08-08 DIAGNOSIS — T39395A Adverse effect of other nonsteroidal anti-inflammatory drugs [NSAID], initial encounter: Secondary | ICD-10-CM | POA: Diagnosis present

## 2016-08-08 DIAGNOSIS — R109 Unspecified abdominal pain: Secondary | ICD-10-CM | POA: Diagnosis not present

## 2016-08-08 DIAGNOSIS — T380X5A Adverse effect of glucocorticoids and synthetic analogues, initial encounter: Secondary | ICD-10-CM | POA: Diagnosis present

## 2016-08-08 DIAGNOSIS — M1710 Unilateral primary osteoarthritis, unspecified knee: Secondary | ICD-10-CM | POA: Diagnosis present

## 2016-08-08 DIAGNOSIS — N390 Urinary tract infection, site not specified: Secondary | ICD-10-CM | POA: Diagnosis present

## 2016-08-08 DIAGNOSIS — M6281 Muscle weakness (generalized): Secondary | ICD-10-CM | POA: Diagnosis not present

## 2016-08-08 DIAGNOSIS — Z885 Allergy status to narcotic agent status: Secondary | ICD-10-CM

## 2016-08-08 DIAGNOSIS — D649 Anemia, unspecified: Secondary | ICD-10-CM | POA: Diagnosis not present

## 2016-08-08 DIAGNOSIS — K2970 Gastritis, unspecified, without bleeding: Secondary | ICD-10-CM | POA: Diagnosis present

## 2016-08-08 DIAGNOSIS — G8929 Other chronic pain: Secondary | ICD-10-CM | POA: Diagnosis not present

## 2016-08-08 DIAGNOSIS — R404 Transient alteration of awareness: Secondary | ICD-10-CM | POA: Diagnosis not present

## 2016-08-08 DIAGNOSIS — D500 Iron deficiency anemia secondary to blood loss (chronic): Secondary | ICD-10-CM | POA: Diagnosis not present

## 2016-08-08 DIAGNOSIS — E860 Dehydration: Secondary | ICD-10-CM | POA: Diagnosis present

## 2016-08-08 DIAGNOSIS — M47815 Spondylosis without myelopathy or radiculopathy, thoracolumbar region: Secondary | ICD-10-CM | POA: Diagnosis not present

## 2016-08-08 DIAGNOSIS — K279 Peptic ulcer, site unspecified, unspecified as acute or chronic, without hemorrhage or perforation: Secondary | ICD-10-CM | POA: Diagnosis present

## 2016-08-08 DIAGNOSIS — I959 Hypotension, unspecified: Secondary | ICD-10-CM | POA: Diagnosis present

## 2016-08-08 DIAGNOSIS — Z8719 Personal history of other diseases of the digestive system: Secondary | ICD-10-CM

## 2016-08-08 DIAGNOSIS — Z881 Allergy status to other antibiotic agents status: Secondary | ICD-10-CM

## 2016-08-08 DIAGNOSIS — Z880 Allergy status to penicillin: Secondary | ICD-10-CM

## 2016-08-08 DIAGNOSIS — K449 Diaphragmatic hernia without obstruction or gangrene: Secondary | ICD-10-CM | POA: Diagnosis not present

## 2016-08-08 DIAGNOSIS — Z8679 Personal history of other diseases of the circulatory system: Secondary | ICD-10-CM

## 2016-08-08 LAB — URINALYSIS, ROUTINE W REFLEX MICROSCOPIC
Bilirubin Urine: NEGATIVE
Glucose, UA: NEGATIVE mg/dL
KETONES UR: NEGATIVE mg/dL
Leukocytes, UA: NEGATIVE
NITRITE: NEGATIVE
PROTEIN: NEGATIVE mg/dL
Specific Gravity, Urine: 1.02 (ref 1.005–1.030)
pH: 5 (ref 5.0–8.0)

## 2016-08-08 LAB — COMPREHENSIVE METABOLIC PANEL
ALT: 13 U/L — AB (ref 14–54)
AST: 24 U/L (ref 15–41)
Albumin: 2.8 g/dL — ABNORMAL LOW (ref 3.5–5.0)
Alkaline Phosphatase: 53 U/L (ref 38–126)
Anion gap: 14 (ref 5–15)
BUN: 67 mg/dL — AB (ref 6–20)
CHLORIDE: 113 mmol/L — AB (ref 101–111)
CO2: 17 mmol/L — AB (ref 22–32)
CREATININE: 1.13 mg/dL — AB (ref 0.44–1.00)
Calcium: 9.1 mg/dL (ref 8.9–10.3)
GFR calc Af Amer: 46 mL/min — ABNORMAL LOW (ref >60)
GFR calc non Af Amer: 39 mL/min — ABNORMAL LOW (ref >60)
Glucose, Bld: 241 mg/dL — ABNORMAL HIGH (ref 65–99)
POTASSIUM: 3.9 mmol/L (ref 3.5–5.1)
SODIUM: 144 mmol/L (ref 135–145)
Total Bilirubin: 0.3 mg/dL (ref 0.3–1.2)
Total Protein: 5.8 g/dL — ABNORMAL LOW (ref 6.5–8.1)

## 2016-08-08 LAB — PREPARE RBC (CROSSMATCH)

## 2016-08-08 LAB — CBC
HEMATOCRIT: 23.8 % — AB (ref 36.0–46.0)
Hemoglobin: 7.9 g/dL — ABNORMAL LOW (ref 12.0–15.0)
MCH: 31.1 pg (ref 26.0–34.0)
MCHC: 33.2 g/dL (ref 30.0–36.0)
MCV: 93.7 fL (ref 78.0–100.0)
PLATELETS: 235 10*3/uL (ref 150–400)
RBC: 2.54 MIL/uL — AB (ref 3.87–5.11)
RDW: 15.2 % (ref 11.5–15.5)
WBC: 30 10*3/uL — ABNORMAL HIGH (ref 4.0–10.5)

## 2016-08-08 LAB — PROTIME-INR
INR: 1.18
PROTHROMBIN TIME: 15.1 s (ref 11.4–15.2)

## 2016-08-08 LAB — POC OCCULT BLOOD, ED: Fecal Occult Bld: POSITIVE — AB

## 2016-08-08 LAB — I-STAT CG4 LACTIC ACID, ED: LACTIC ACID, VENOUS: 6.67 mmol/L — AB (ref 0.5–1.9)

## 2016-08-08 LAB — MRSA PCR SCREENING: MRSA BY PCR: NEGATIVE

## 2016-08-08 MED ORDER — ACETAMINOPHEN 325 MG PO TABS: 650.0000 mg | ORAL_TABLET | Freq: Four times a day (QID) | ORAL | Status: DC | PRN

## 2016-08-08 MED ORDER — LEVOFLOXACIN IN D5W 750 MG/150ML IV SOLN: 750.0000 mg | Freq: Once | INTRAVENOUS | Status: DC

## 2016-08-08 MED ORDER — IOPAMIDOL (ISOVUE-370) INJECTION 76%
INTRAVENOUS | Status: AC
  Administered 2016-08-08: 80 mL via INTRAVENOUS
  Filled 2016-08-08: qty 100

## 2016-08-08 MED ORDER — CARBOXYMETHYLCELLULOSE SODIUM 1 % OP SOLN: 1.0000 [drp] | Freq: Two times a day (BID) | OPHTHALMIC | Status: DC

## 2016-08-08 MED ORDER — SODIUM CHLORIDE 0.9% FLUSH
3.0000 mL | Freq: Two times a day (BID) | INTRAVENOUS | Status: DC
  Administered 2016-08-08 – 2016-08-12 (×7): 3 mL via INTRAVENOUS

## 2016-08-08 MED ORDER — DEXTROSE 5 % IV SOLN
2.0000 g | Freq: Once | INTRAVENOUS | Status: AC
  Administered 2016-08-08: 2 g via INTRAVENOUS
  Filled 2016-08-08: qty 2

## 2016-08-08 MED ORDER — SODIUM CHLORIDE 0.9 % IV SOLN
8.0000 mg/h | INTRAVENOUS | Status: DC
  Administered 2016-08-08 – 2016-08-10 (×4): 8 mg/h via INTRAVENOUS
  Filled 2016-08-08 (×6): qty 80

## 2016-08-08 MED ORDER — ONDANSETRON HCL 4 MG PO TABS: 4.0000 mg | ORAL_TABLET | Freq: Four times a day (QID) | ORAL | Status: DC | PRN

## 2016-08-08 MED ORDER — SODIUM CHLORIDE 0.9 % IV SOLN
80.0000 mg | Freq: Once | INTRAVENOUS | Status: AC
  Administered 2016-08-08: 13:00:00 80 mg via INTRAVENOUS
  Filled 2016-08-08: qty 80

## 2016-08-08 MED ORDER — POLYVINYL ALCOHOL 1.4 % OP SOLN
1.0000 [drp] | Freq: Two times a day (BID) | OPHTHALMIC | Status: DC
  Administered 2016-08-08 – 2016-08-12 (×8): 1 [drp] via OPHTHALMIC
  Filled 2016-08-08: qty 15

## 2016-08-08 MED ORDER — METRONIDAZOLE IN NACL 5-0.79 MG/ML-% IV SOLN
500.0000 mg | Freq: Three times a day (TID) | INTRAVENOUS | Status: DC
  Filled 2016-08-08: qty 100

## 2016-08-08 MED ORDER — SODIUM CHLORIDE 0.9 % IV SOLN
INTRAVENOUS | Status: DC
  Administered 2016-08-08 – 2016-08-09 (×2): via INTRAVENOUS

## 2016-08-08 MED ORDER — SODIUM CHLORIDE 0.9 % IV SOLN: Freq: Once | INTRAVENOUS | Status: DC

## 2016-08-08 MED ORDER — SODIUM CHLORIDE 0.9 % IV BOLUS (SEPSIS)
1000.0000 mL | Freq: Once | INTRAVENOUS | Status: AC
  Administered 2016-08-08: 1000 mL via INTRAVENOUS

## 2016-08-08 MED ORDER — ACETAMINOPHEN 650 MG RE SUPP: 650.0000 mg | Freq: Four times a day (QID) | RECTAL | Status: DC | PRN

## 2016-08-08 MED ORDER — SODIUM CHLORIDE 0.9 % IV SOLN: 250.0000 mL | INTRAVENOUS | Status: DC | PRN

## 2016-08-08 MED ORDER — ONDANSETRON HCL 4 MG/2ML IJ SOLN
4.0000 mg | Freq: Four times a day (QID) | INTRAMUSCULAR | Status: DC | PRN
Start: 2016-08-08 — End: 2016-08-12

## 2016-08-08 MED ORDER — SODIUM CHLORIDE 0.9% FLUSH
3.0000 mL | Freq: Two times a day (BID) | INTRAVENOUS | Status: DC
  Administered 2016-08-08: 3 mL via INTRAVENOUS

## 2016-08-08 MED ORDER — VENLAFAXINE HCL ER 75 MG PO CP24
75.0000 mg | ORAL_CAPSULE | Freq: Every day | ORAL | Status: DC
  Administered 2016-08-09 – 2016-08-12 (×4): 75 mg via ORAL
  Filled 2016-08-08 (×4): qty 1

## 2016-08-08 MED ORDER — MORPHINE SULFATE (PF) 2 MG/ML IV SOLN
1.0000 mg | INTRAVENOUS | Status: DC | PRN
  Administered 2016-08-08: 1 mg via INTRAVENOUS
  Filled 2016-08-08: qty 1

## 2016-08-08 MED ORDER — DEXTROSE 5 % IV SOLN: 2.0000 g | INTRAVENOUS | Status: DC

## 2016-08-08 MED ORDER — METRONIDAZOLE IN NACL 5-0.79 MG/ML-% IV SOLN
500.0000 mg | Freq: Once | INTRAVENOUS | Status: AC
  Administered 2016-08-08: 500 mg via INTRAVENOUS
  Filled 2016-08-08: qty 100

## 2016-08-08 MED ORDER — PRENATAL 27-0.8 MG PO TABS
1.0000 | ORAL_TABLET | Freq: Every day | ORAL | Status: DC
  Administered 2016-08-10 – 2016-08-11 (×2): 1 via ORAL
  Filled 2016-08-08 (×4): qty 1

## 2016-08-08 MED ORDER — IOPAMIDOL (ISOVUE-370) INJECTION 76%
80.0000 mL | Freq: Once | INTRAVENOUS | Status: AC | PRN
  Administered 2016-08-08: 80 mL via INTRAVENOUS

## 2016-08-08 MED ORDER — LIDOCAINE 5 % EX PTCH
1.0000 | MEDICATED_PATCH | CUTANEOUS | Status: DC
  Administered 2016-08-09 – 2016-08-12 (×4): 1 via TRANSDERMAL
  Filled 2016-08-08 (×4): qty 1

## 2016-08-08 MED ORDER — SODIUM CHLORIDE 0.9% FLUSH: 3.0000 mL | INTRAVENOUS | Status: DC | PRN

## 2016-08-08 NOTE — Progress Notes (Addendum)
Pharmacy Antibiotic Note  [name] is a 83 y.o. female admitted on 08/08/2016 with sepsis.  Pharmacy has been consulted for cefepime dosing. Concern for intra-abdominal and/or UTI  Plan:  Cefepime 2gm x 1 then 2gm IV q24h based on current renal function  Patient has received ceftriaxone, cephalexin, and cefuroxime  Metronidazole 500mg  IV q8h - no renal dose adjustment required     Temp (24hrs), Avg:98.4 F (36.9 C), Min:98.4 F (36.9 C), Max:98.4 F (36.9 C)   Recent Labs Lab 08/04/16 2221 08/08/16 1047 08/08/16 1051  WBC 11.6* 30.0*  --   CREATININE 0.97  --   --   LATICACIDVEN  --   --  6.67*    Estimated Creatinine Clearance: 31 mL/min (by C-G formula based on SCr of 0.97 mg/dL).    Allergies  Allergen Reactions  . Ace Inhibitors Hives and Itching  . Streptomycin Other (See Comments) and Itching    Pruritus Pruritus   . Tramadol Other (See Comments)    unknown  . Vesicare [Solifenacin Succinate] Itching  . Penicillins Rash    Pruritic rash Has patient had a PCN reaction causing immediate rash, facial/tongue/throat swelling, SOB or lightheadedness with hypotension: unknown Has patient had a PCN reaction causing severe rash involving mucus membranes or skin necrosis: unknown Has patient had a PCN reaction that required hospitalization:unknown Has patient had a PCN reaction occurring within the last 10 years: yes If all of the above answers are "NO", then may proceed with Cephalosporin use.     Antimicrobials this admission: 7/12 cefepime >> 7/12 metronidazole >>  Dose adjustments this admission:  Microbiology results: 7/12 Ucx 7/12 Bcx:   Thank you for allowing pharmacy to be a part of this patient's care.  Doreene Eland, PharmD, BCPS.   Pager: [phone] 08/08/2016 11:21 AM

## 2016-08-08 NOTE — ED Notes (Signed)
Informed edp issacs about istat lactic acid result

## 2016-08-08 NOTE — H&P (Signed)
History and Physical    [name]:[id number] DOB: Jul 01, 1916 DOA: 08/08/2016  PCP: Josetta Huddle, MD   Patient coming from: Home  Chief Complaint: Back pain   HPI: [name] is a 83 y.o. female with medical history significant of ambulatory dysfunction, congestive heart failure, chronic kidney disease stage III, coronary artery disease, who presents with the chief complaint of back pain. For the last 2 weeks patient has been experiencing worsening back pain, initially intermittent but over last 7 days more persistent, she has been placed on prednisone and ibuprofen, she recently received a in the injection of steroids with no significant improvement of her back pain. The pain is at the lower back, moderate to severe intensity, sharp in nature, mildly improved with NSAIDs and steroids, worse with ambulation and movement. It has been associated with dark stools, lightheadedness, dizziness and generalized weakness she presented today due to worsening symptoms.  ED Course:  Found with hypotensive and anemia, referred for admission. GI was consulted.   Review of Systems:  1. Gen. Positive for malaise, generalized weakness, easy fatigability 2. ENT no runny nose or sore throat 3. Pulmonary positive for shortness of breath on exertion, no wheezing, cough or hemoptysis 4. Cardiovascular, no angina no claudication, no PND orthopnea 5. Gastrointestinal. No nausea, no diarrhea, positive abdominal pain. Positive hematochezia as mentioned in history present illness, no hematemesis. 6. Hematology no easy bruisability or frequent infections 7. Urology no dysuria or increased urinary frequency 8. Endocrine a tremors, heat or cold intolerance 9. Skin no rashes 10. Neurology no seizures or paresthesias, she ambulates with the help of a cane  Past Medical History:  Diagnosis Date  . Allergic rhinitis   . CAD (coronary artery disease)   . CHF (congestive heart failure) ([place])   . CKD  (chronic kidney disease) stage 3, GFR 30-59 ml/min 09/12/2014  . Diverticular disease   . Diverticulosis   . DJD (degenerative joint disease) of knee   . DJD (degenerative joint disease) of lumbar spine    scoliosis  . Eczema   . Esophageal erosions    from nsaid   . GI bleed   . Hormone replacement therapy (postmenopausal)   . Hyperglycemia   . Hyperlipidemia   . Hypertension   . Pneumonia    july 2009  . Pulmonary hypertension ([place])    a. 2D echo 03/2008 showed normal biventricular thickness, chamber size and systolic function, EF 77-82%, mild mitral regurgitation, aortic sclerosis, moderate pulmonary HTN, pulmonic regurgitation.  Marland Kitchen PVC's (premature ventricular contractions)   . Reactive depression (situational)   . Sciatica    right lower extremity  . Shingles    right thoracic 2008  . Vertigo     Past Surgical History:  Procedure Laterality Date  . APPENDECTOMY    . BACK SURGERY    . BREAST LUMPECTOMY    . FLEXIBLE SIGMOIDOSCOPY Left 08/13/2013   Procedure: FLEXIBLE SIGMOIDOSCOPY;  Surgeon: Arta Silence, MD;  Location: [facility];  Service: Endoscopy;  Laterality: Left;  . TOTAL ABDOMINAL HYSTERECTOMY       reports that she has never smoked. She has never used smokeless tobacco. She reports that she drinks alcohol. She reports that she does not use drugs.  Allergies  Allergen Reactions  . Ace Inhibitors Hives and Itching  . Streptomycin Other (See Comments) and Itching    Pruritus Pruritus   . Tramadol Other (See Comments)    unknown  . Vesicare [Solifenacin Succinate] Itching  . Penicillins  Rash    Pruritic rash Has patient had a PCN reaction causing immediate rash, facial/tongue/throat swelling, SOB or lightheadedness with hypotension: unknown Has patient had a PCN reaction causing severe rash involving mucus membranes or skin necrosis: unknown Has patient had a PCN reaction that required hospitalization:unknown Has patient had a PCN reaction occurring  within the last 10 years: yes If all of the above answers are "NO", then may proceed with Cephalosporin use.     Family History  Problem Relation Age of Onset  . Heart disease Father   . Heart attack Father   . Heart disease Mother   . Heart attack Mother   . Heart disease Daughter   . Heart disease Son   . Cancer Brother   . Heart disease Sister   . Heart attack Sister        X2  . Heart attack Brother   . Hypertension Brother   . Stroke Neg Hx      Prior to Admission medications   Medication Sig Start Date End Date Taking? Authorizing Provider  furosemide (LASIX) 40 MG tablet Take 40 mg by mouth daily.    Yes [provider]  isosorbide mononitrate (IMDUR) 30 MG 24 hr tablet Take 1 tablet (30 mg total) by mouth daily. 10/10/15  Yes Jettie Booze, MD  loperamide (IMODIUM) 2 MG capsule Take 2 mg by mouth daily. 01/08/16  Yes [provider]  potassium chloride SA (K-DUR,KLOR-CON) 20 MEQ tablet Take 20 mEq by mouth 2 (two) times daily. 11/28/15  Yes [provider]  predniSONE (STERAPRED UNI-PAK 21 TAB) 10 MG (21) TBPK tablet Take by mouth daily. Take 6 tabs by mouth daily  for 2 days, then 5 tabs for 2 days, then 4 tabs for 2 days, then 3 tabs for 2 days, 2 tabs for 2 days, then 1 tab by mouth daily for 2 days 08/05/16  Yes Little, Wenda Overland, MD  Prenatal Vit-Fe Fumarate-FA (PRENATAL PO) Take 1 tablet by mouth daily.   Yes [provider]  venlafaxine XR (EFFEXOR-XR) 75 MG 24 hr capsule Take 75 mg by mouth daily. 01/08/16  Yes [provider]  acetaminophen (TYLENOL) 500 MG tablet Take 1,000 mg by mouth every 6 (six) hours as needed for moderate pain.    [provider]  aspirin EC 81 MG tablet Take 81 mg by mouth once.     [provider]  atenolol (TENORMIN) 25 MG tablet Take by mouth daily.    [provider]  clotrimazole (LOTRIMIN) 1 % cream Apply 1 application topically as needed (foot --  infection/rash).     [provider]  estrogens, conjugated, (PREMARIN) 0.3 MG tablet Take 0.3 mg by mouth every morning.     [provider]  meclizine (ANTIVERT) 25 MG tablet Take 25 mg by mouth 2 (two) times daily as needed for dizziness or nausea.     [provider]  Polyvinyl Alcohol-Povidone (REFRESH OP) Apply 1 drop to eye 2 (two) times daily.    [provider]    Physical Exam: Vitals:   08/08/16 1430 08/08/16 1500 08/08/16 1530 08/08/16 1631  BP: (!) 85/74 102/61 119/61 126/63  Pulse: 71  67 64  Resp: (!) 23 19 19 20   Temp:      TempSrc:      SpO2: 99%  94% 95%    Constitutional: deconditioned Vitals:   08/08/16 1430 08/08/16 1500 08/08/16 1530 08/08/16 1631  BP: (!) 85/74 102/61  119/61 126/63  Pulse: 71  67 64  Resp: (!) 23 19 19 20   Temp:      TempSrc:      SpO2: 99%  94% 95%   Eyes: PERRL, lids and conjunctivae pallor, no icterus, oral mucosa dry.  Head normocephalic, nose and ears no deformties.  ENMT: Mucous membranes are dry. Posterior pharynx clear of any exudate or lesions.Normal dentition.  Neck: normal, supple, no masses, no thyromegaly Respiratory: clear to auscultation bilaterally, no wheezing, no crackles. Normal respiratory effort. No accessory muscle use.  Cardiovascular: Regular rate and rhythm, no murmurs / rubs / gallops. No extremity edema. 2+ pedal pulses. No carotid bruits.  Abdomen: tender to palpation, no rebound or guarding, no peritoneal signs, no masses palpated. No hepatosplenomegaly. Bowel sounds positive.  Musculoskeletal: no clubbing / cyanosis. No joint deformity upper and lower extremities. Good ROM, no contractures. Normal muscle tone.  Skin: no rashes, lesions, ulcers. No induration Neurologic: CN 2-12 grossly intact. Sensation intact, DTR normal. Strength 5/5 in all 4.    Labs on Admission: I have personally reviewed following labs and imaging studies  CBC:  Recent Labs Lab 08/04/16 2221  08/08/16 1047  WBC 11.6* 30.0*  HGB 11.4* 7.9*  HCT 34.7* 23.8*  MCV 89.9 93.7  PLT 188 220   Basic Metabolic Panel:  Recent Labs Lab 08/04/16 2221 08/08/16 1047  NA 139 144  K 3.5 3.9  CL 106 113*  CO2 22 17*  GLUCOSE 104* 241*  BUN 12 67*  CREATININE 0.97 1.13*  CALCIUM 8.7* 9.1   GFR: Estimated Creatinine Clearance: 26.6 mL/min (A) (by C-G formula based on SCr of 1.13 mg/dL (H)). Liver Function Tests:  Recent Labs Lab 08/04/16 2221 08/08/16 1047  AST 24 24  ALT 12* 13*  ALKPHOS 70 53  BILITOT 0.5 0.3  PROT 6.6 5.8*  ALBUMIN 2.9* 2.8*   No results for input(s): LIPASE, AMYLASE in the last 168 hours. No results for input(s): AMMONIA in the last 168 hours. Coagulation Profile:  Recent Labs Lab 08/08/16 1145  INR 1.18   Cardiac Enzymes: No results for input(s): CKTOTAL, CKMB, CKMBINDEX, TROPONINI in the last 168 hours. BNP (last 3 results)  Recent Labs  05/28/16 1153  PROBNP 411   HbA1C: No results for input(s): HGBA1C in the last 72 hours. CBG: No results for input(s): GLUCAP in the last 168 hours. Lipid Profile: No results for input(s): CHOL, HDL, LDLCALC, TRIG, CHOLHDL, LDLDIRECT in the last 72 hours. Thyroid Function Tests: No results for input(s): TSH, T4TOTAL, FREET4, T3FREE, THYROIDAB in the last 72 hours. Anemia Panel: No results for input(s): VITAMINB12, FOLATE, FERRITIN, TIBC, IRON, RETICCTPCT in the last 72 hours. Urine analysis:    Component Value Date/Time   COLORURINE YELLOW 08/08/2016 1310   APPEARANCEUR HAZY (A) 08/08/2016 1310   LABSPEC 1.020 08/08/2016 1310   PHURINE 5.0 08/08/2016 1310   GLUCOSEU NEGATIVE 08/08/2016 1310   HGBUR LARGE (A) 08/08/2016 1310   BILIRUBINUR NEGATIVE 08/08/2016 1310   KETONESUR NEGATIVE 08/08/2016 1310   PROTEINUR NEGATIVE 08/08/2016 1310   UROBILINOGEN 0.2 09/18/2014 1600   NITRITE NEGATIVE 08/08/2016 1310   LEUKOCYTESUR NEGATIVE 08/08/2016 1310    Radiological Exams on Admission: Dg  Abdomen Acute W/chest  Result Date: 08/08/2016 CLINICAL DATA:  Mid abdominal pain, nausea EXAM: DG ABDOMEN ACUTE W/ 1V CHEST COMPARISON:  08/04/2016 FINDINGS: The bowel gas pattern is normal. There is no evidence of free intraperitoneal air. No suspicious radio-opaque calculi or other significant radiographic abnormality is  seen. Heart size and mediastinal contours are within normal limits. Both lungs are clear. Scoliosis and degenerative changes in the lumbar spine. IMPRESSION: No evidence of bowel obstruction or free air. No active cardiopulmonary disease. Electronically Signed   By: Rolm Baptise M.D.   On: 08/08/2016 12:14   Ct Angio Abd/pel W And/or Wo Contrast  Result Date: 08/08/2016 CLINICAL DATA:  dark tarry stools per HH. On antibiotics for UTI currently. BP possibly 60 systolic by EMS upon arrival. Alert and oriented.Upper abd pain since Sunday with n/v and blood in stools;lactic acidosis; eval for ishemic bowel EXAM: CTA ABDOMEN AND PELVIS WITH CONTRAST TECHNIQUE: Multidetector CT imaging of the abdomen and pelvis was performed using the standard protocol during bolus administration of intravenous contrast. Multiplanar reconstructed images and MIPs were obtained and reviewed to evaluate the vascular anatomy. CONTRAST:  80 mL Isovue 370 IV COMPARISON:  09/12/2014 FINDINGS: VASCULAR Aorta: Minimal plaque. Moderately tortuous. No dissection, aneurysm, or stenosis. Celiac: Mild proximal narrowing at the level of the median arcuate ligament of the diaphragm, patent distally with unremarkable branching. SMA: Patent without evidence of aneurysm, dissection, vasculitis or significant stenosis. Visualized distal branching unremarkable. Renals: Both renal arteries are patent without evidence of aneurysm, dissection, vasculitis, fibromuscular dysplasia or significant stenosis. IMA: Patent without evidence of aneurysm, dissection, vasculitis or significant stenosis. Inflow: Patent without evidence of aneurysm,  dissection, vasculitis or significant stenosis. Mild tortuosity. Proximal Outflow: Bilateral common femoral and visualized portions of the superficial and profunda femoral arteries are patent without evidence of aneurysm, dissection, vasculitis or significant stenosis. Veins: Patent hepatic veins, portal vein, SMV, splenic vein, bilateral renal veins, IVC, and iliac venous systems. Review of the MIP images confirms the above findings. NON-VASCULAR Lower chest: Linear scarring or subsegmental atelectasis medially at both lung bases. No pleural or pericardial effusion. Hepatobiliary: No focal liver abnormality is seen. No gallstones, gallbladder wall thickening, or biliary dilatation. Stable central biliary ductal ectasia. Pancreas: Unremarkable. No pancreatic ductal dilatation or surrounding inflammatory changes. Spleen: Normal in size without focal abnormality. Adrenals/Urinary Tract: Adrenal glands are unremarkable. Kidneys are normal, without renal calculi, focal lesion, or hydronephrosis. Bladder is unremarkable. Stomach/Bowel: Moderate hiatal hernia. The stomach is incompletely distended limiting mucosal assessment. Small bowel is nondilated. Appendix not discretely identified. Scattered colonic diverticula without adjacent inflammatory/ edematous change. Some wall thickening in the mid sigmoid colon, nonspecific, with some small regional mesenteric nodes. Lymphatic: Subcentimeter left para-aortic and aortocaval lymph nodes. No mesenteric or pelvic adenopathy. Reproductive: Status post hysterectomy. No adnexal masses. Other: No ascites.  No free air. Musculoskeletal: Chronic Interbody fusion T12-L2, and Spondylitic changes throughout the visualized lower thoracic and lumbar spine. No fracture or worrisome bone lesion. IMPRESSION: VASCULAR 1. No significant proximal mesenteric arterial occlusive disease or other significant vascular findings. NON-VASCULAR 1. Scattered colonic diverticulosis without CT evidence of  diverticulitis, abscess, or active extravasation. Correlate with colonoscopic findings. 2. Moderate hiatal hernia.  Correlate with endoscopic findings. Electronically Signed   By: Lucrezia Europe M.D.   On: 08/08/2016 14:21    EKG: Independently reviewed. Normal sinus rhythm, positive premature ventricular complexes  Assessment/Plan Active Problems:   GI bleed  This is a 83 year old female with chronic back pain, more severe over last 2 weeks, she has been taking prednisone and NSAIDs. Over last week she had notice black stools, lightheadedness, dizziness and easy fatigability. On initial physical examination her temperature 97.5, blood pressure 121/65, 95/68, heart rate 86, 72, respiratory rate 27-17, oxygen saturations 100%, positive pallor, no icterus, dry  oral mucosa, lungs clear to auscultation bilaterally, heart S1-S2 present rhythmic, the abdomen is soft but tender, no guarding or peritoneal signs, no lower extremity edema. Sodium 144, potassium 3.9, chloride 113, bicarbonate 17, glucose 241 [place] 67, creatinine 1.13, AST 24, ALT 13, white count 30.0, hemoglobin 7.9, hematocrit 23.8, platelets 235, INR 1.1, urinalysis negative for infection, fecal occult blood positive. CT of the abdomen with no acute abnormalities, chest film with no infiltrates.   Working diagnosis acute blood loss anemia due to suspected upper GI bleed due to nonsteroidal use, related to chronic back pain.  1. Upper GI bleed with acute blood loss anemia. Patient will be admitted to the stepdown unit, she will placed on IV fluids with normal saline at 75 mL per hour, will continue on Protonix infusion continuously, nothing by mouth, patient was seen by Gastroenterology, will follow up on recommendations. For now will check hemoglobin and hematocrit every 6 hours 4. Blood transfusion ordered, 1 unit packed red blood cells  2. Suspected gastritis, rule out peptic ulcer disease. Patient has been on NSAIDs and steroids, patient  may need upper endoscopy, diagnostic. Will continue antiacid therapy as above.  3. Acute on chronic back pain. Will avoid further steroids or NSAIDs, patient will be placed on low-dose morphine intravenously, topical lidocaine patch.   4. Hypertension. Will hold on furosemide, isosorbide and atenolol due to risk of hypotension. Patient received IV fluids, with isotonic solutions. Admit to stepdown unit. Hold on aspirin.  5. Depression. : Continue venlafaxine  DVT prophylaxis: SCD Family Communication:  Disposition Plan: Home Consults called: GI Admission status: Inpatient    Mauricio Gerome Apley MD [facility] Pager [phone]  If 7PM-7AM, please contact night-coverage [email or web address] Password [facility]  08/08/2016, 4:37 PM

## 2016-08-08 NOTE — ED Notes (Signed)
Pt remains in CT. IV team accompanying pt to CT scanner to look for IV.

## 2016-08-08 NOTE — ED Triage Notes (Signed)
Pt BIB EMS from home after having dark tarry stools per [facility]. On antibiotics for UTI currently. BP possibly 60 systolic by EMS upon arrival. Alert and oriented.

## 2016-08-08 NOTE — Consult Note (Signed)
[facility] Consultation Note  Referring Provider: [facility] Primary Care Physician:  Josetta Huddle, MD  Reason for Consultation:  Blood in stool, anemia  HPI: [name] is a 83 y.o. female with several days' of weakness, black stool, upper abdominal pain.  Found to be dehydrated with elevated lactic acid; CT showed no cause of pain and specifically no mesenteric ischemia.  She takes NSAIDs periodically.  Prior episode of hemorrhoidal bleeding few years' ago; current symptoms much different.  Had endoscopy about 5-6 years ago that showed clean-based antral ulcers.   Past Medical History:  Diagnosis Date  . Allergic rhinitis   . CAD (coronary artery disease)   . CHF (congestive heart failure) ([place])   . CKD (chronic kidney disease) stage 3, GFR 30-59 ml/min 09/12/2014  . Diverticular disease   . Diverticulosis   . DJD (degenerative joint disease) of knee   . DJD (degenerative joint disease) of lumbar spine    scoliosis  . Eczema   . Esophageal erosions    from nsaid   . GI bleed   . Hormone replacement therapy (postmenopausal)   . Hyperglycemia   . Hyperlipidemia   . Hypertension   . Pneumonia    july 2009  . Pulmonary hypertension ([place])    a. 2D echo 03/2008 showed normal biventricular thickness, chamber size and systolic function, EF 37-62%, mild mitral regurgitation, aortic sclerosis, moderate pulmonary HTN, pulmonic regurgitation.  Marland Kitchen PVC's (premature ventricular contractions)   . Reactive depression (situational)   . Sciatica    right lower extremity  . Shingles    right thoracic 2008  . Vertigo     Past Surgical History:  Procedure Laterality Date  . APPENDECTOMY    . BACK SURGERY    . BREAST LUMPECTOMY    . FLEXIBLE SIGMOIDOSCOPY Left 08/13/2013   Procedure: FLEXIBLE SIGMOIDOSCOPY;  Surgeon: Arta Silence, MD;  Location: [facility];  Service: Endoscopy;  Laterality: Left;  . TOTAL ABDOMINAL HYSTERECTOMY      Prior to Admission  medications   Medication Sig Start Date End Date Taking? Authorizing Provider  furosemide (LASIX) 40 MG tablet Take 40 mg by mouth daily.    Yes [provider]  isosorbide mononitrate (IMDUR) 30 MG 24 hr tablet Take 1 tablet (30 mg total) by mouth daily. 10/10/15  Yes Jettie Booze, MD  loperamide (IMODIUM) 2 MG capsule Take 2 mg by mouth daily. 01/08/16  Yes [provider]  potassium chloride SA (K-DUR,KLOR-CON) 20 MEQ tablet Take 20 mEq by mouth 2 (two) times daily. 11/28/15  Yes [provider]  predniSONE (STERAPRED UNI-PAK 21 TAB) 10 MG (21) TBPK tablet Take by mouth daily. Take 6 tabs by mouth daily  for 2 days, then 5 tabs for 2 days, then 4 tabs for 2 days, then 3 tabs for 2 days, 2 tabs for 2 days, then 1 tab by mouth daily for 2 days 08/05/16  Yes Little, Wenda Overland, MD  Prenatal Vit-Fe Fumarate-FA (PRENATAL PO) Take 1 tablet by mouth daily.   Yes [provider]  venlafaxine XR (EFFEXOR-XR) 75 MG 24 hr capsule Take 75 mg by mouth daily. 01/08/16  Yes [provider]  acetaminophen (TYLENOL) 500 MG tablet Take 1,000 mg by mouth every 6 (six) hours as needed for moderate pain.    [provider]  aspirin EC 81 MG tablet Take 81 mg by mouth once.     [provider]  atenolol (TENORMIN) 25 MG tablet Take by  mouth daily.    [provider]  clotrimazole (LOTRIMIN) 1 % cream Apply 1 application topically as needed (foot -- infection/rash).     [provider]  estrogens, conjugated, (PREMARIN) 0.3 MG tablet Take 0.3 mg by mouth every morning.     [provider]  meclizine (ANTIVERT) 25 MG tablet Take 25 mg by mouth 2 (two) times daily as needed for dizziness or nausea.     [provider]  Polyvinyl Alcohol-Povidone (REFRESH OP) Apply 1 drop to eye 2 (two) times daily.    [provider]    Current Facility-Administered Medications  Medication Dose Route Frequency Provider  Last Rate Last Dose  . 0.9 %  sodium chloride infusion   Intravenous Once Duffy Bruce, MD   Stopped at 08/08/16 1355  . 0.9 %  sodium chloride infusion   Intravenous Once Duffy Bruce, MD   Stopped at 08/08/16 1559  . [START ON 08/09/2016] ceFEPIme (MAXIPIME) 2 g in dextrose 5 % 50 mL IVPB  2 g Intravenous Q24H Berton Mount, RPH      . metroNIDAZOLE (FLAGYL) IVPB 500 mg  500 mg Intravenous Q8H Berton Mount, RPH      . pantoprazole (PROTONIX) 80 mg in sodium chloride 0.9 % 250 mL (0.32 mg/mL) infusion  8 mg/hr Intravenous Continuous Duffy Bruce, MD       Current Outpatient Prescriptions  Medication Sig Dispense Refill  . furosemide (LASIX) 40 MG tablet Take 40 mg by mouth daily.     . isosorbide mononitrate (IMDUR) 30 MG 24 hr tablet Take 1 tablet (30 mg total) by mouth daily. 90 tablet 3  . loperamide (IMODIUM) 2 MG capsule Take 2 mg by mouth daily.  0  . potassium chloride SA (K-DUR,KLOR-CON) 20 MEQ tablet Take 20 mEq by mouth 2 (two) times daily.  0  . predniSONE (STERAPRED UNI-PAK 21 TAB) 10 MG (21) TBPK tablet Take by mouth daily. Take 6 tabs by mouth daily  for 2 days, then 5 tabs for 2 days, then 4 tabs for 2 days, then 3 tabs for 2 days, 2 tabs for 2 days, then 1 tab by mouth daily for 2 days 42 tablet 0  . Prenatal Vit-Fe Fumarate-FA (PRENATAL PO) Take 1 tablet by mouth daily.    Marland Kitchen venlafaxine XR (EFFEXOR-XR) 75 MG 24 hr capsule Take 75 mg by mouth daily.  0  . acetaminophen (TYLENOL) 500 MG tablet Take 1,000 mg by mouth every 6 (six) hours as needed for moderate pain.    Marland Kitchen aspirin EC 81 MG tablet Take 81 mg by mouth once.     Marland Kitchen atenolol (TENORMIN) 25 MG tablet Take by mouth daily.    . clotrimazole (LOTRIMIN) 1 % cream Apply 1 application topically as needed (foot -- infection/rash).     Marland Kitchen estrogens, conjugated, (PREMARIN) 0.3 MG tablet Take 0.3 mg by mouth every morning.     . meclizine (ANTIVERT) 25 MG tablet Take 25 mg by mouth 2 (two) times daily as needed for  dizziness or nausea.     . Polyvinyl Alcohol-Povidone (REFRESH OP) Apply 1 drop to eye 2 (two) times daily.      Allergies as of 08/08/2016 - Review Complete 08/08/2016  Allergen Reaction Noted  . Ace inhibitors Hives and Itching 12/25/2010  . Streptomycin Other (See Comments) and Itching 12/25/2010  . Tramadol Other (See Comments) 12/25/2010  . Vesicare [solifenacin succinate] Itching 08/04/2016  . Penicillins Rash 12/25/2010    Family History  Problem Relation Age of Onset  . Heart disease Father   . Heart attack Father   . Heart disease Mother   . Heart attack Mother   . Heart disease Daughter   . Heart disease Son   . Cancer Brother   . Heart disease Sister   . Heart attack Sister        X2  . Heart attack Brother   . Hypertension Brother   . Stroke Neg Hx     Social History   Social History  . Marital status: Widowed    Spouse name: N/A  . Number of children: 0  . Years of education: N/A   Occupational History  . retired Education officer, [employer]    Social History Main Topics  . Smoking status: Never Smoker  . Smokeless tobacco: Never Used  . Alcohol use Yes     Comment: occasional cocktail  . Drug use: No  . Sexual activity: No   Other Topics Concern  . Not on file   Social History Narrative   Pt is widowed and lives alone.          Review of Systems: As per HPI, all others negative  Physical Exam: Vital signs in last 24 hours: Temp:  [97.5 F (36.4 C)-98.4 F (36.9 C)] 97.5 F (36.4 C) (07/12 1400) Pulse Rate:  [67-86] 67 (07/12 1530) Resp:  [17-27] 19 (07/12 1530) BP: (85-126)/(59-110) 119/61 (07/12 1530) SpO2:  [94 %-100 %] 94 % (07/12 1530)   General:   Alert,  Elderly, somewhat frail-appearing, NAD Head:  Normocephalic and atraumatic. Eyes:  Sclera clear, no icterus.   Conjunctiva pale Ears:  Normal auditory acuity. Nose:  No deformity, discharge,  or lesions. Mouth:  No deformity or lesions.  Oropharynx pale and dry Neck:  Supple; no  masses or thyromegaly. Lungs:  Clear throughout to auscultation.   No wheezes, crackles, or rhonchi. No acute distress. Heart:  Regular rate and rhythm; no murmurs, clicks, rubs,  or gallops. Abdomen:  Soft, mild epigastric tenderness without peritonitis. No masses, hepatosplenomegaly or hernias noted. Normal bowel sounds, without guarding, and without rebound.     Msk:  Symmetrical without gross deformities. Normal posture. Pulses:  Normal pulses noted. Extremities:  Without clubbing or edema. Neurologic:  Alert and  oriented x4; diffusely weak, otherwise grossly normal neurologically. Skin: Fragile, some bruising, otherwise Intact without significant lesions or rashes. Psych:  Alert and cooperative. Normal mood and affect.   Lab Results:  Recent Labs  08/08/16 1047  WBC 30.0*  HGB 7.9*  HCT 23.8*  PLT 235   BMET  Recent Labs  08/08/16 1047  NA 144  K 3.9  CL 113*  CO2 17*  GLUCOSE 241*  BUN 67*  CREATININE 1.13*  CALCIUM 9.1   LFT  Recent Labs  08/08/16 1047  PROT 5.8*  ALBUMIN 2.8*  AST 24  ALT 13*  ALKPHOS 53  BILITOT 0.3   PT/INR  Recent Labs  08/08/16 1145  LABPROT 15.1  INR 1.18    Studies/Results: Dg Abdomen Acute W/chest  Result Date: 08/08/2016 CLINICAL DATA:  Mid abdominal pain, nausea EXAM: DG ABDOMEN ACUTE W/ 1V CHEST COMPARISON:  08/04/2016 FINDINGS: The bowel gas pattern is normal. There is no evidence of free intraperitoneal air. No suspicious radio-opaque calculi or other significant radiographic abnormality is seen. Heart size and mediastinal contours are within normal limits. Both lungs are clear. Scoliosis and degenerative changes in the lumbar spine. IMPRESSION: No evidence of bowel obstruction  or free air. No active cardiopulmonary disease. Electronically Signed   By: Rolm Baptise M.D.   On: 08/08/2016 12:14   Ct Angio Abd/pel W And/or Wo Contrast  Result Date: 08/08/2016 CLINICAL DATA:  dark tarry stools per HH. On antibiotics for  UTI currently. BP possibly 60 systolic by EMS upon arrival. Alert and oriented.Upper abd pain since Sunday with n/v and blood in stools;lactic acidosis; eval for ishemic bowel EXAM: CTA ABDOMEN AND PELVIS WITH CONTRAST TECHNIQUE: Multidetector CT imaging of the abdomen and pelvis was performed using the standard protocol during bolus administration of intravenous contrast. Multiplanar reconstructed images and MIPs were obtained and reviewed to evaluate the vascular anatomy. CONTRAST:  80 mL Isovue 370 IV COMPARISON:  09/12/2014 FINDINGS: VASCULAR Aorta: Minimal plaque. Moderately tortuous. No dissection, aneurysm, or stenosis. Celiac: Mild proximal narrowing at the level of the median arcuate ligament of the diaphragm, patent distally with unremarkable branching. SMA: Patent without evidence of aneurysm, dissection, vasculitis or significant stenosis. Visualized distal branching unremarkable. Renals: Both renal arteries are patent without evidence of aneurysm, dissection, vasculitis, fibromuscular dysplasia or significant stenosis. IMA: Patent without evidence of aneurysm, dissection, vasculitis or significant stenosis. Inflow: Patent without evidence of aneurysm, dissection, vasculitis or significant stenosis. Mild tortuosity. Proximal Outflow: Bilateral common femoral and visualized portions of the superficial and profunda femoral arteries are patent without evidence of aneurysm, dissection, vasculitis or significant stenosis. Veins: Patent hepatic veins, portal vein, SMV, splenic vein, bilateral renal veins, IVC, and iliac venous systems. Review of the MIP images confirms the above findings. NON-VASCULAR Lower chest: Linear scarring or subsegmental atelectasis medially at both lung bases. No pleural or pericardial effusion. Hepatobiliary: No focal liver abnormality is seen. No gallstones, gallbladder wall thickening, or biliary dilatation. Stable central biliary ductal ectasia. Pancreas: Unremarkable. No  pancreatic ductal dilatation or surrounding inflammatory changes. Spleen: Normal in size without focal abnormality. Adrenals/Urinary Tract: Adrenal glands are unremarkable. Kidneys are normal, without renal calculi, focal lesion, or hydronephrosis. Bladder is unremarkable. Stomach/Bowel: Moderate hiatal hernia. The stomach is incompletely distended limiting mucosal assessment. Small bowel is nondilated. Appendix not discretely identified. Scattered colonic diverticula without adjacent inflammatory/ edematous change. Some wall thickening in the mid sigmoid colon, nonspecific, with some small regional mesenteric nodes. Lymphatic: Subcentimeter left para-aortic and aortocaval lymph nodes. No mesenteric or pelvic adenopathy. Reproductive: Status post hysterectomy. No adnexal masses. Other: No ascites.  No free air. Musculoskeletal: Chronic Interbody fusion T12-L2, and Spondylitic changes throughout the visualized lower thoracic and lumbar spine. No fracture or worrisome bone lesion. IMPRESSION: VASCULAR 1. No significant proximal mesenteric arterial occlusive disease or other significant vascular findings. NON-VASCULAR 1. Scattered colonic diverticulosis without CT evidence of diverticulitis, abscess, or active extravasation. Correlate with colonoscopic findings. 2. Moderate hiatal hernia.  Correlate with endoscopic findings. Electronically Signed   By: Lucrezia Europe M.D.   On: 08/08/2016 14:21    Impression:  1.  Upper abdominal pain. 2.  Melena, 1-2 per day for several days, in setting of NSAID use. 3.  Prior personal history of ASA/NSAID-related antral ulcers. 4.  Acute blood loss anemia. 5.  UTI.  6.  Multiple medical problems.  Plan:  1.  Given patient's age and comorbidities, as well as lack of hemodynamic compromise at the present time, will opt for conservative management with PPI and asa/NSAID avoidance and serial CBCs. 2.  If situation improves on medical therapy, would defer endoscopy, given  patient's age and comorbidities; on the other hand if, despite medical management, patient has ongoing melena  and drop in Hgb, might have to consider endoscopy. 3.  Case discussed with medical team, patient and her nephew at length, and they agree with current plan. 4.  [facility] will follow.   LOS: 0 days   Konner Warrior M  08/08/2016, 4:18 PM  Pager [phone] If no answer or after 5 PM call [phone]

## 2016-08-08 NOTE — ED Notes (Signed)
Main lab called for additional lab stick.

## 2016-08-08 NOTE — ED Provider Notes (Addendum)
[facility] DEPT Provider Note   CSN: [id number] Arrival date & time: 08/08/16  1000     History   Chief Complaint Chief Complaint  Patient presents with  . Stool Color Change  . Urinary Tract Infection    HPI [name] is a 83 y.o. female.  HPI   83 yo M with PMHx as below here with generalized fatigue. Pt states that over the past several days she has felt generally fatigued and tired. Pt has noticed black, tarry stools for 1-2 weeks. OVer the same time period, she has had progressively worsening fatigue, difficulty walking, and is sleeping more. She is unable to do much other than get out of bed without help now. She has a h/o LGIB 2/2 hemorrhoids but states this is different. Does not take any blood thinners. No CP, SOB. NO syncope or palpitations.  Past Medical History:  Diagnosis Date  . Allergic rhinitis   . CAD (coronary artery disease)   . CHF (congestive heart failure) ([place])   . CKD (chronic kidney disease) stage 3, GFR 30-59 ml/min 09/12/2014  . Diverticular disease   . Diverticulosis   . DJD (degenerative joint disease) of knee   . DJD (degenerative joint disease) of lumbar spine    scoliosis  . Eczema   . Esophageal erosions    from nsaid   . GI bleed   . Hormone replacement therapy (postmenopausal)   . Hyperglycemia   . Hyperlipidemia   . Hypertension   . Pneumonia    july 2009  . Pulmonary hypertension ([place])    a. 2D echo 03/2008 showed normal biventricular thickness, chamber size and systolic function, EF 62-69%, mild mitral regurgitation, aortic sclerosis, moderate pulmonary HTN, pulmonic regurgitation.  Marland Kitchen PVC's (premature ventricular contractions)   . Reactive depression (situational)   . Sciatica    right lower extremity  . Shingles    right thoracic 2008  . Vertigo     Patient Active Problem List   Diagnosis Date Noted  . DJD (degenerative joint disease) 08/09/2016  . GI bleed 08/08/2016  . Abnormal echocardiogram 06/14/2016  .  PVC's (premature ventricular contractions)   . Bacteremia 09/18/2014  . Fever   . Febrile illness 09/12/2014  . Diverticulitis 09/12/2014  . CKD (chronic kidney disease) stage 3, GFR 30-59 ml/min 09/12/2014  . HTN (hypertension) 09/12/2014  . Thrombocytopenia ([place]) 09/12/2014  . Abnormal cardiovascular stress test 01/13/2014  . Rectal bleed 08/10/2013  . Acute renal failure ([place]) 08/10/2013  . Essential hypertension, benign 01/08/2013  . DOE (dyspnea on exertion) 01/08/2013    Past Surgical History:  Procedure Laterality Date  . APPENDECTOMY    . BACK SURGERY    . BREAST LUMPECTOMY    . FLEXIBLE SIGMOIDOSCOPY Left 08/13/2013   Procedure: FLEXIBLE SIGMOIDOSCOPY;  Surgeon: Arta Silence, MD;  Location: [facility];  Service: Endoscopy;  Laterality: Left;  . TOTAL ABDOMINAL HYSTERECTOMY      OB History    No data available       Home Medications    Prior to Admission medications   Medication Sig Start Date End Date Taking? Authorizing Provider  isosorbide mononitrate (IMDUR) 30 MG 24 hr tablet Take 1 tablet (30 mg total) by mouth daily. 10/10/15  Yes Jettie Booze, MD  Prenatal Vit-Fe Fumarate-FA (PRENATAL PO) Take 1 tablet by mouth daily.   Yes [provider]  venlafaxine XR (EFFEXOR-XR) 75 MG 24 hr capsule Take 75 mg by mouth daily. 01/08/16  Yes [provider]  acetaminophen (TYLENOL) 500 MG tablet Take 1 tablet (500 mg total) by mouth every 6 (six) hours as needed for moderate pain. 08/12/16   Domenic Polite, MD  atenolol (TENORMIN) 25 MG tablet Take by mouth daily.    [provider]  clotrimazole (LOTRIMIN) 1 % cream Apply 1 application topically as needed (foot -- infection/rash).     [provider]  estrogens, conjugated, (PREMARIN) 0.3 MG tablet Take 0.3 mg by mouth every morning.     [provider]  HYDROcodone-acetaminophen (NORCO/VICODIN) 5-325 MG tablet Take 1 tablet by mouth every 6 (six) hours as needed for  severe pain. 08/12/16   Domenic Polite, MD  loperamide (IMODIUM) 2 MG capsule Take 1 capsule (2 mg total) by mouth as needed for diarrhea or loose stools. 08/12/16   Domenic Polite, MD  meclizine (ANTIVERT) 25 MG tablet Take 25 mg by mouth 2 (two) times daily as needed for dizziness or nausea.     [provider]  pantoprazole (PROTONIX) 40 MG tablet Take 1 tablet (40 mg total) by mouth 2 (two) times daily. For 1 month and then once daily 08/12/16   Domenic Polite, MD  Polyvinyl Alcohol-Povidone (REFRESH OP) Apply 1 drop to eye 2 (two) times daily.    [provider]    Family History Family History  Problem Relation Age of Onset  . Heart disease Father   . Heart attack Father   . Heart disease Mother   . Heart attack Mother   . Heart disease Daughter   . Heart disease Son   . Cancer Brother   . Heart disease Sister   . Heart attack Sister        X2  . Heart attack Brother   . Hypertension Brother   . Stroke Neg Hx     Social History Social History  Substance Use Topics  . Smoking status: Never Smoker  . Smokeless tobacco: Never Used  . Alcohol use Yes     Comment: occasional cocktail     Allergies   Ace inhibitors; Streptomycin; Tramadol; Vesicare [solifenacin succinate]; and Penicillins   Review of Systems Review of Systems  Constitutional: Positive for fatigue.  Gastrointestinal: Positive for blood in stool and nausea.  Neurological: Positive for weakness.  All other systems reviewed and are negative.    Physical Exam Updated Vital Signs BP (!) 106/59 (BP Location: Left Arm)   Pulse 60   Temp 98.3 F (36.8 C) (Oral)   Resp 16   Ht 5\' 6"  (1.676 m) Comment: Pt. claims 5'6" but also claims to have shrunk since then  Wt 68 kg (150 lb)   SpO2 99%   BMI 24.21 kg/m   Physical Exam  Constitutional: She is oriented to person, place, and time. She appears well-developed and well-nourished. No distress.  HENT:  Head: Normocephalic and  atraumatic.  Subconj pallor bilaterally  Eyes: Conjunctivae are normal.  Neck: Neck supple.  Cardiovascular: Normal rate, regular rhythm and normal heart sounds.  Exam reveals no friction rub.   No murmur heard. Pulmonary/Chest: Effort normal and breath sounds normal. No respiratory distress. She has no wheezes. She has no rales.  Abdominal: She exhibits no distension.  Musculoskeletal: She exhibits no edema.  Neurological: She is alert and oriented to person, place, and time. She exhibits normal muscle tone.  Skin: Skin is dry. Capillary refill takes 2 to 3 seconds. There is pallor.  Psychiatric: She has a normal mood and affect.  Nursing note  and vitals reviewed.    ED Treatments / Results  Labs (all labs ordered are listed, but only abnormal results are displayed) Labs Reviewed  URINE CULTURE - Abnormal; Notable for the following:       Result Value   Culture MULTIPLE ORGANISMS PRESENT, NONE PREDOMINANT (*)    All other components within normal limits  URINE CULTURE - Abnormal; Notable for the following:    Culture MULTIPLE SPECIES PRESENT, SUGGEST RECOLLECTION (*)    All other components within normal limits  COMPREHENSIVE METABOLIC PANEL - Abnormal; Notable for the following:    Chloride 113 (*)    CO2 17 (*)    Glucose, Bld 241 (*)    BUN 67 (*)    Creatinine, Ser 1.13 (*)    Total Protein 5.8 (*)    Albumin 2.8 (*)    ALT 13 (*)    GFR calc non Af Amer 39 (*)    GFR calc Af Amer 46 (*)    All other components within normal limits  CBC - Abnormal; Notable for the following:    WBC 30.0 (*)    RBC 2.54 (*)    Hemoglobin 7.9 (*)    HCT 23.8 (*)    All other components within normal limits  URINALYSIS, ROUTINE W REFLEX MICROSCOPIC - Abnormal; Notable for the following:    APPearance HAZY (*)    Hgb urine dipstick LARGE (*)    Bacteria, UA RARE (*)    Squamous Epithelial / LPF 0-5 (*)    All other components within normal limits  HEMOGLOBIN AND HEMATOCRIT, BLOOD -  Abnormal; Notable for the following:    Hemoglobin 10.3 (*)    HCT 30.1 (*)    All other components within normal limits  HEMOGLOBIN AND HEMATOCRIT, BLOOD - Abnormal; Notable for the following:    Hemoglobin 10.4 (*)    HCT 30.4 (*)    All other components within normal limits  BASIC METABOLIC PANEL - Abnormal; Notable for the following:    Potassium 3.3 (*)    Chloride 117 (*)    CO2 21 (*)    BUN 36 (*)    Calcium 8.4 (*)    GFR calc non Af Amer 51 (*)    GFR calc Af Amer 59 (*)    All other components within normal limits  CBC - Abnormal; Notable for the following:    WBC 17.0 (*)    Hemoglobin 11.5 (*)    HCT 34.4 (*)    RDW 16.9 (*)    All other components within normal limits  CBC - Abnormal; Notable for the following:    WBC 15.2 (*)    RBC 3.83 (*)    Hemoglobin 11.1 (*)    HCT 33.3 (*)    RDW 16.9 (*)    All other components within normal limits  BASIC METABOLIC PANEL - Abnormal; Notable for the following:    Chloride 114 (*)    CO2 21 (*)    Glucose, Bld 101 (*)    BUN 22 (*)    Calcium 8.6 (*)    GFR calc non Af Amer 58 (*)    All other components within normal limits  CBC - Abnormal; Notable for the following:    WBC 21.0 (*)    RBC 3.70 (*)    Hemoglobin 10.7 (*)    HCT 32.5 (*)    RDW 16.3 (*)    All other components within normal limits  BASIC METABOLIC  PANEL - Abnormal; Notable for the following:    Potassium 3.3 (*)    CO2 21 (*)    Glucose, Bld 122 (*)    Calcium 8.4 (*)    All other components within normal limits  URINALYSIS, ROUTINE W REFLEX MICROSCOPIC - Abnormal; Notable for the following:    APPearance HAZY (*)    Hgb urine dipstick SMALL (*)    Leukocytes, UA TRACE (*)    Squamous Epithelial / LPF 0-5 (*)    All other components within normal limits  CBC - Abnormal; Notable for the following:    WBC 14.0 (*)    RBC 3.62 (*)    Hemoglobin 10.7 (*)    HCT 31.9 (*)    RDW 16.2 (*)    All other components within normal limits  BASIC  METABOLIC PANEL - Abnormal; Notable for the following:    Potassium 3.2 (*)    CO2 21 (*)    Glucose, Bld 114 (*)    Calcium 8.2 (*)    All other components within normal limits  POC OCCULT BLOOD, ED - Abnormal; Notable for the following:    Fecal Occult Bld POSITIVE (*)    All other components within normal limits  I-STAT CG4 LACTIC ACID, ED - Abnormal; Notable for the following:    Lactic Acid, Venous 6.67 (*)    All other components within normal limits  CULTURE, BLOOD (ROUTINE X 2)  CULTURE, BLOOD (ROUTINE X 2)  MRSA PCR SCREENING  PROTIME-INR  TYPE AND SCREEN  PREPARE RBC (CROSSMATCH)  PREPARE RBC (CROSSMATCH)    EKG  EKG Interpretation  Date/Time:  Thursday August 08 2016 11:11:31 EDT Ventricular Rate:  85 PR Interval:    QRS Duration: 74 QT Interval:  372 QTC Calculation: 443 R Axis:   -25 Text Interpretation:  Sinus rhythm Multiform ventricular premature complexes Low voltage, precordial leads Abnormal R-wave progression, early transition Probable LVH with secondary repol abnrm Confirmed by Orpah Greek [phone]) on 08/09/2016 5:56:18 PM       Radiology No results found.  Procedures .Critical Care Performed by: Duffy Bruce Authorized by: Duffy Bruce      (including critical care time)   CRITICAL CARE Performed by: Evonnie Pat   Total critical care time: 35 minutes  Critical care time was exclusive of separately billable procedures and treating other patients.  Critical care was necessary to treat or prevent imminent or life-threatening deterioration.  Critical care was time spent personally by me on the following activities: development of treatment plan with patient and/or surrogate as well as nursing, discussions with consultants, evaluation of patient's response to treatment, examination of patient, obtaining history from patient or surrogate, ordering and performing treatments and interventions, ordering and review of laboratory  studies, ordering and review of radiographic studies, pulse oximetry and re-evaluation of patient's condition.   Emergency Ultrasound Study:   Angiocath insertion Performed by: Evonnie Pat Consent: Verbal consent/emergent consent obtained. Risks and benefits: risks, benefits and alternatives were discussed Immediately prior to procedure the correct patient, procedure, equipment, support staff and site/side marked as needed.  Indication: difficult IV access Preparation: Patient was prepped and draped in the usual sterile fashion. Sterile gel was used for this procedure and the ultrasound probe was sterilized prior to use. Vein Location: Right forearm vein was visualized during assessment for potential access sites and was found to be patent/ easily compressed with linear ultrasound.  The needle was visualized with real-time ultrasound and guided into the vein.  Gauge: 20  Image saved and stored.  Normal blood return.   Patient tolerance: Patient tolerated the procedure well with no immediate complications.        Medications Ordered in ED Medications  sodium chloride 0.9 % bolus 1,000 mL (0 mLs Intravenous Stopped 08/08/16 1311)  sodium chloride 0.9 % bolus 1,000 mL (0 mLs Intravenous Stopped 08/08/16 1311)  metroNIDAZOLE (FLAGYL) IVPB 500 mg (0 mg Intravenous Stopped 08/08/16 1705)  ceFEPIme (MAXIPIME) 2 g in dextrose 5 % 50 mL IVPB (0 g Intravenous Stopped 08/08/16 1435)  pantoprazole (PROTONIX) 80 mg in sodium chloride 0.9 % 100 mL IVPB (0 mg Intravenous Stopped 08/08/16 1335)  iopamidol (ISOVUE-370) 76 % injection 80 mL (80 mLs Intravenous Contrast Given 08/08/16 1228)  potassium chloride SA (K-DUR,KLOR-CON) CR tablet 40 mEq (40 mEq Oral Given 08/09/16 1149)  potassium chloride SA (K-DUR,KLOR-CON) CR tablet 40 mEq (40 mEq Oral Given 08/12/16 1125)     Initial Impression / Assessment and Plan / ED Course  I have reviewed the triage vital signs and the nursing notes.  Pertinent  labs & imaging results that were available during my care of the patient were reviewed by me and considered in my medical decision making (see chart for details).     83 yo F here with melena, fatigue. Exam as above, remarkable for +gross melena. No BRBPR. Lab work shows significant drop in Hgb from 11.4 to 7.9 in 4 days, c/w acute GIB. BUN markedly elevated, c/w likely UGIB. Pt give protonix bolus and gtt. Of note, LA markedly elevated initially - suspect this is 2/2 blood loss, dehydration but CODE SEPSIS initiated given age, comorbidities and CT Angio obtained 2/2 concern for mesenteric ischemia, which is fortunately negative. Will transfuse 2u pRBC given ongoing blood loss with mild hypotension, and admit. Discussed with Dr. Paulita Fujita of GI, who has seen in ED. Continue PPI, monitor CBCs. Pt o/w stable and improving with blood. PIV placed by myself.  Final Clinical Impressions(s) / ED Diagnoses   Final diagnoses:  Blood loss anemia  UGIB (upper gastrointestinal bleed)  Lactic acidosis      Duffy Bruce, MD 08/20/16 [phone]

## 2016-08-09 DIAGNOSIS — D500 Iron deficiency anemia secondary to blood loss (chronic): Secondary | ICD-10-CM

## 2016-08-09 DIAGNOSIS — M1990 Unspecified osteoarthritis, unspecified site: Secondary | ICD-10-CM | POA: Diagnosis present

## 2016-08-09 DIAGNOSIS — M47815 Spondylosis without myelopathy or radiculopathy, thoracolumbar region: Secondary | ICD-10-CM

## 2016-08-09 DIAGNOSIS — K922 Gastrointestinal hemorrhage, unspecified: Secondary | ICD-10-CM

## 2016-08-09 LAB — URINE CULTURE

## 2016-08-09 LAB — BASIC METABOLIC PANEL
ANION GAP: 7 (ref 5–15)
BUN: 36 mg/dL — AB (ref 6–20)
CALCIUM: 8.4 mg/dL — AB (ref 8.9–10.3)
CO2: 21 mmol/L — ABNORMAL LOW (ref 22–32)
Chloride: 117 mmol/L — ABNORMAL HIGH (ref 101–111)
Creatinine, Ser: 0.92 mg/dL (ref 0.44–1.00)
GFR calc Af Amer: 59 mL/min — ABNORMAL LOW (ref >60)
GFR calc non Af Amer: 51 mL/min — ABNORMAL LOW (ref >60)
GLUCOSE: 96 mg/dL (ref 65–99)
POTASSIUM: 3.3 mmol/L — AB (ref 3.5–5.1)
SODIUM: 145 mmol/L (ref 135–145)

## 2016-08-09 LAB — CBC
HEMATOCRIT: 34.4 % — AB (ref 36.0–46.0)
HEMOGLOBIN: 11.5 g/dL — AB (ref 12.0–15.0)
MCH: 29 pg (ref 26.0–34.0)
MCHC: 33.4 g/dL (ref 30.0–36.0)
MCV: 86.6 fL (ref 78.0–100.0)
Platelets: 186 10*3/uL (ref 150–400)
RBC: 3.97 MIL/uL (ref 3.87–5.11)
RDW: 16.9 % — AB (ref 11.5–15.5)
WBC: 17 10*3/uL — AB (ref 4.0–10.5)

## 2016-08-09 LAB — TYPE AND SCREEN
ABO/RH(D): A NEG
ANTIBODY SCREEN: NEGATIVE
UNIT DIVISION: 0
Unit division: 0

## 2016-08-09 LAB — BPAM RBC
BLOOD PRODUCT EXPIRATION DATE: 201807282359
Blood Product Expiration Date: 201808132359
ISSUE DATE / TIME: 201807121327
ISSUE DATE / TIME: 201807121817
UNIT TYPE AND RH: 600
UNIT TYPE AND RH: 600

## 2016-08-09 LAB — HEMOGLOBIN AND HEMATOCRIT, BLOOD
HCT: 30.4 % — ABNORMAL LOW (ref 36.0–46.0)
HEMATOCRIT: 30.1 % — AB (ref 36.0–46.0)
HEMOGLOBIN: 10.3 g/dL — AB (ref 12.0–15.0)
Hemoglobin: 10.4 g/dL — ABNORMAL LOW (ref 12.0–15.0)

## 2016-08-09 MED ORDER — HYDROCODONE-ACETAMINOPHEN 5-325 MG PO TABS
1.0000 | ORAL_TABLET | Freq: Four times a day (QID) | ORAL | Status: DC | PRN
  Filled 2016-08-09: qty 1

## 2016-08-09 MED ORDER — POTASSIUM CHLORIDE CRYS ER 20 MEQ PO TBCR
40.0000 meq | EXTENDED_RELEASE_TABLET | Freq: Once | ORAL | Status: AC
  Administered 2016-08-09: 40 meq via ORAL
  Filled 2016-08-09: qty 2

## 2016-08-09 NOTE — Progress Notes (Signed)
[facility] PROGRESS NOTE Subjective patient without any gross bleeding overnight. She denies any abdominal pain. Has a history of ulcers in the past and takes incidents. Has been discussed by Dr. Paulita Fujita with patient and nephew in plan is to follow her clinically and treated as if this was another ulcer and holder NSAIDs. Patient states that she feels okay.  Objective: Vital signs in last 24 hours: Temp:  [97.5 F (36.4 C)-98.5 F (36.9 C)] 98.1 F (36.7 C) (07/13 0800) Pulse Rate:  [57-86] 60 (07/13 0700) Resp:  [13-27] 14 (07/13 0700) BP: (85-144)/(33-110) 144/53 (07/13 0700) SpO2:  [94 %-100 %] 99 % (07/13 0700) Weight:  [63.5 kg (139 lb 15.9 oz)] 63.5 kg (139 lb 15.9 oz) (07/12 1815) Last BM Date: 08/08/16  Intake/Output from previous day: 07/12 0701 - 07/13 0700 In: 3686.6 [I.V.:708.8; Blood:729.8; IV Piggyback:2248] Out: 350 [Urine:350] Intake/Output this shift: Total I/O In: 420 [P.O.:120; I.V.:300] Out: 150 [Urine:150]  PE: General-- very elderly white female who is not appear to be any significant distress. She does answer questions somewhat appropriately  Abdomen-- soft and completely nontender  Lab Results:  Recent Labs  08/08/16 1047 08/08/16 2346 08/09/16 0704  WBC 30.0*  --   --   HGB 7.9* 10.3* 10.4*  HCT 23.8* 30.1* 30.4*  PLT 235  --   --    BMET  Recent Labs  08/08/16 1047  NA 144  K 3.9  CL 113*  CO2 17*  CREATININE 1.13*   LFT  Recent Labs  08/08/16 1047  PROT 5.8*  AST 24  ALT 13*  ALKPHOS 53  BILITOT 0.3   PT/INR  Recent Labs  08/08/16 1145  LABPROT 15.1  INR 1.18   PANCREAS No results for input(s): LIPASE in the last 72 hours.       Studies/Results: Dg Abdomen Acute W/chest  Result Date: 08/08/2016 CLINICAL DATA:  Mid abdominal pain, nausea EXAM: DG ABDOMEN ACUTE W/ 1V CHEST COMPARISON:  08/04/2016 FINDINGS: The bowel gas pattern is normal. There is no evidence of free intraperitoneal air. No  suspicious radio-opaque calculi or other significant radiographic abnormality is seen. Heart size and mediastinal contours are within normal limits. Both lungs are clear. Scoliosis and degenerative changes in the lumbar spine. IMPRESSION: No evidence of bowel obstruction or free air. No active cardiopulmonary disease. Electronically Signed   By: Rolm Baptise M.D.   On: 08/08/2016 12:14   Ct Angio Abd/pel W And/or Wo Contrast  Result Date: 08/08/2016 CLINICAL DATA:  dark tarry stools per HH. On antibiotics for UTI currently. BP possibly 60 systolic by EMS upon arrival. Alert and oriented.Upper abd pain since Sunday with n/v and blood in stools;lactic acidosis; eval for ishemic bowel EXAM: CTA ABDOMEN AND PELVIS WITH CONTRAST TECHNIQUE: Multidetector CT imaging of the abdomen and pelvis was performed using the standard protocol during bolus administration of intravenous contrast. Multiplanar reconstructed images and MIPs were obtained and reviewed to evaluate the vascular anatomy. CONTRAST:  80 mL Isovue 370 IV COMPARISON:  09/12/2014 FINDINGS: VASCULAR Aorta: Minimal plaque. Moderately tortuous. No dissection, aneurysm, or stenosis. Celiac: Mild proximal narrowing at the level of the median arcuate ligament of the diaphragm, patent distally with unremarkable branching. SMA: Patent without evidence of aneurysm, dissection, vasculitis or significant stenosis. Visualized distal branching unremarkable. Renals: Both renal arteries are patent without evidence of aneurysm, dissection, vasculitis, fibromuscular dysplasia or significant stenosis. IMA: Patent without evidence of aneurysm, dissection, vasculitis or significant stenosis. Inflow: Patent without evidence of aneurysm, dissection,  vasculitis or significant stenosis. Mild tortuosity. Proximal Outflow: Bilateral common femoral and visualized portions of the superficial and profunda femoral arteries are patent without evidence of aneurysm, dissection, vasculitis or  significant stenosis. Veins: Patent hepatic veins, portal vein, SMV, splenic vein, bilateral renal veins, IVC, and iliac venous systems. Review of the MIP images confirms the above findings. NON-VASCULAR Lower chest: Linear scarring or subsegmental atelectasis medially at both lung bases. No pleural or pericardial effusion. Hepatobiliary: No focal liver abnormality is seen. No gallstones, gallbladder wall thickening, or biliary dilatation. Stable central biliary ductal ectasia. Pancreas: Unremarkable. No pancreatic ductal dilatation or surrounding inflammatory changes. Spleen: Normal in size without focal abnormality. Adrenals/Urinary Tract: Adrenal glands are unremarkable. Kidneys are normal, without renal calculi, focal lesion, or hydronephrosis. Bladder is unremarkable. Stomach/Bowel: Moderate hiatal hernia. The stomach is incompletely distended limiting mucosal assessment. Small bowel is nondilated. Appendix not discretely identified. Scattered colonic diverticula without adjacent inflammatory/ edematous change. Some wall thickening in the mid sigmoid colon, nonspecific, with some small regional mesenteric nodes. Lymphatic: Subcentimeter left para-aortic and aortocaval lymph nodes. No mesenteric or pelvic adenopathy. Reproductive: Status post hysterectomy. No adnexal masses. Other: No ascites.  No free air. Musculoskeletal: Chronic Interbody fusion T12-L2, and Spondylitic changes throughout the visualized lower thoracic and lumbar spine. No fracture or worrisome bone lesion. IMPRESSION: VASCULAR 1. No significant proximal mesenteric arterial occlusive disease or other significant vascular findings. NON-VASCULAR 1. Scattered colonic diverticulosis without CT evidence of diverticulitis, abscess, or active extravasation. Correlate with colonoscopic findings. 2. Moderate hiatal hernia.  Correlate with endoscopic findings. Electronically Signed   By: Lucrezia Europe M.D.   On: 08/08/2016 14:21    Medications: I have  reviewed the patient's current medications.  Assessment/Plan: 1. Melena. Probably due to NSAID related ulcer. Due to her age of 89 she and the family do not wish EGD unless absolutely necessary. She appears stable at this time. We would keep her own clear liquids for now as well as IV Protonix and followed clinically. EGD only for active bleeding. [facility] will follow.   Laurelyn Terrero JR,Sh[place]tell Dixson L 08/09/2016, 8:47 AM  This note was created using voice recognition software. Minor errors may Have occurred unintentionally.  Pager: [phone] If no answer or after hours call [phone]

## 2016-08-09 NOTE — Progress Notes (Addendum)
PROGRESS NOTE    [name]  TDD:[id number] DOB: 1916/07/21 DOA: 08/08/2016 PCP: Josetta Huddle, MD  Brief Narrative:[name] is a 83 y.o. female with medical history significant of ambulatory dysfunction, congestive heart failure, chronic kidney disease stage III, coronary artery disease, DJD who presented with the chief complaint of back pain. For the last 2 weeks patient has been experiencing worsening back pain, initially intermittent but over last 7 days more persistent, she has been placed on prednisone and ibuprofen, she recently received a in the injection of steroids with no significant improvement of her back pain. The pain is at the lower back, moderate to severe intensity, sharp in nature, mildly improved with NSAIDs and steroids, worse with ambulation and movement. It has been associated with dark stools, lightheadedness, dizziness and generalized weakness she presented 7/12 due to worsening symptoms, in ED Found to be  hypotensive and anemia, referred for admission. GI was consulted.    Assessment & Plan:  1. Upper GI bleed with acute blood loss anemia.  -likely NSAID/prednisone related PUD or gastritis -due to advanced age and debility, plan for conservative mgt, no plan for Endoscopy at this time, this was discussed with pt and family and they are in agreement -continue PPI gtt -monitor Hb and transfuse PRN -no active ongoing bleeding at this time  2. Acute on chronic back pain.  -due to advanced DJD, noted on Xrays and CT abd pelvis done last pm -prior h/o thoraco-lumbar fusion -supportive care, no NSAIDs, tylenol, lidocaine patch, vicodin PRN, stop IV morphine -PT eval tomorrow  3. Hypertension.  -Will hold on furosemide, isosorbide  -HR in 50s, restart atenolol tomorrow as HR tolerates  4. Depression. : Continue venlafaxine  DVT prophylaxis: SCDs  Full COde Family Communication: none at bedside Disposition Plan: Home when stable, keep in SDU  today  Consultants:   [facility]    Subjective: Feel better, no significant back pain  Objective: Vitals:   08/09/16 0700 08/09/16 0800 08/09/16 0900 08/09/16 1000  BP: (!) 144/53 (!) 137/47 (!) 134/42 133/70  Pulse: 60 (!) 57 (!) 57 (!) 45  Resp: 14 19 12  (!) 22  Temp:  98.1 F (36.7 C)    TempSrc:  Oral    SpO2: 99% 99% 100% 100%  Weight:      Height:        Intake/Output Summary (Last 24 hours) at 08/09/16 1034 Last data filed at 08/09/16 1000  Gross per 24 hour  Intake          4276.58 ml  Output              600 ml  Net          3676.58 ml   Filed Weights   08/08/16 1815  Weight: 63.5 kg (139 lb 15.9 oz)    Examination:  General exam: Appears calm and comfortable  Respiratory system: Clear to auscultation.  Cardiovascular system: S1 & S2 heard, RRR.  Gastrointestinal system: Abdomen is nondistended, soft and nontender.Normal bowel sounds heard. Central nervous system: Alert and oriented. No focal neurological deficits. Extremities: Symmetric 5 x 5 power. Skin: No rashes, lesions or ulcers Psychiatry: Judgement and insight appear normal. Mood & affect appropriate.     Data Reviewed:   CBC:  Recent Labs Lab 08/04/16 2221 08/08/16 1047 08/08/16 2346 08/09/16 0704  WBC 11.6* 30.0*  --   --   HGB 11.4* 7.9* 10.3* 10.4*  HCT 34.7* 23.8* 30.1* 30.4*  MCV 89.9 93.7  --   --  PLT 188 235  --   --    Basic Metabolic Panel:  Recent Labs Lab 08/04/16 2221 08/08/16 1047 08/09/16 0816  NA 139 144 145  K 3.5 3.9 3.3*  CL 106 113* 117*  CO2 22 17* 21*  GLUCOSE 104* 241* 96  BUN 12 67* 36*  CREATININE 0.97 1.13* 0.92  CALCIUM 8.7* 9.1 8.4*   GFR: Estimated Creatinine Clearance: 32.7 mL/min (by C-G formula based on SCr of 0.92 mg/dL). Liver Function Tests:  Recent Labs Lab 08/04/16 2221 08/08/16 1047  AST 24 24  ALT 12* 13*  ALKPHOS 70 53  BILITOT 0.5 0.3  PROT 6.6 5.8*  ALBUMIN 2.9* 2.8*   No results for input(s): LIPASE, AMYLASE in  the last 168 hours. No results for input(s): AMMONIA in the last 168 hours. Coagulation Profile:  Recent Labs Lab 08/08/16 1145  INR 1.18   Cardiac Enzymes: No results for input(s): CKTOTAL, CKMB, CKMBINDEX, TROPONINI in the last 168 hours. BNP (last 3 results)  Recent Labs  05/28/16 1153  PROBNP 411   HbA1C: No results for input(s): HGBA1C in the last 72 hours. CBG: No results for input(s): GLUCAP in the last 168 hours. Lipid Profile: No results for input(s): CHOL, HDL, LDLCALC, TRIG, CHOLHDL, LDLDIRECT in the last 72 hours. Thyroid Function Tests: No results for input(s): TSH, T4TOTAL, FREET4, T3FREE, THYROIDAB in the last 72 hours. Anemia Panel: No results for input(s): VITAMINB12, FOLATE, FERRITIN, TIBC, IRON, RETICCTPCT in the last 72 hours. Urine analysis:    Component Value Date/Time   COLORURINE YELLOW 08/08/2016 1310   APPEARANCEUR HAZY (A) 08/08/2016 1310   LABSPEC 1.020 08/08/2016 1310   PHURINE 5.0 08/08/2016 1310   GLUCOSEU NEGATIVE 08/08/2016 1310   HGBUR LARGE (A) 08/08/2016 1310   BILIRUBINUR NEGATIVE 08/08/2016 1310   KETONESUR NEGATIVE 08/08/2016 1310   PROTEINUR NEGATIVE 08/08/2016 1310   UROBILINOGEN 0.2 09/18/2014 1600   NITRITE NEGATIVE 08/08/2016 1310   LEUKOCYTESUR NEGATIVE 08/08/2016 1310   Sepsis Labs: @LABRCNTIP (procalcitonin:4,lacticidven:4)  ) Recent Results (from the past 240 hour(s))  MRSA PCR Screening     Status: None   Collection Time: 08/08/16  5:32 PM  Result Value Ref Range Status   MRSA by PCR NEGATIVE NEGATIVE Final    Comment:        The GeneXpert MRSA Assay (FDA approved for NASAL specimens only), is one component of a comprehensive MRSA colonization surveillance program. It is not intended to diagnose MRSA infection nor to guide or monitor treatment for MRSA infections.          Radiology Studies: Dg Abdomen Acute W/chest  Result Date: 08/08/2016 CLINICAL DATA:  Mid abdominal pain, nausea EXAM: DG  ABDOMEN ACUTE W/ 1V CHEST COMPARISON:  08/04/2016 FINDINGS: The bowel gas pattern is normal. There is no evidence of free intraperitoneal air. No suspicious radio-opaque calculi or other significant radiographic abnormality is seen. Heart size and mediastinal contours are within normal limits. Both lungs are clear. Scoliosis and degenerative changes in the lumbar spine. IMPRESSION: No evidence of bowel obstruction or free air. No active cardiopulmonary disease. Electronically Signed   By: Rolm Baptise M.D.   On: 08/08/2016 12:14   Ct Angio Abd/pel W And/or Wo Contrast  Result Date: 08/08/2016 CLINICAL DATA:  dark tarry stools per HH. On antibiotics for UTI currently. BP possibly 60 systolic by EMS upon arrival. Alert and oriented.Upper abd pain since Sunday with n/v and blood in stools;lactic acidosis; eval for ishemic bowel EXAM: CTA ABDOMEN AND PELVIS  WITH CONTRAST TECHNIQUE: Multidetector CT imaging of the abdomen and pelvis was performed using the standard protocol during bolus administration of intravenous contrast. Multiplanar reconstructed images and MIPs were obtained and reviewed to evaluate the vascular anatomy. CONTRAST:  80 mL Isovue 370 IV COMPARISON:  09/12/2014 FINDINGS: VASCULAR Aorta: Minimal plaque. Moderately tortuous. No dissection, aneurysm, or stenosis. Celiac: Mild proximal narrowing at the level of the median arcuate ligament of the diaphragm, patent distally with unremarkable branching. SMA: Patent without evidence of aneurysm, dissection, vasculitis or significant stenosis. Visualized distal branching unremarkable. Renals: Both renal arteries are patent without evidence of aneurysm, dissection, vasculitis, fibromuscular dysplasia or significant stenosis. IMA: Patent without evidence of aneurysm, dissection, vasculitis or significant stenosis. Inflow: Patent without evidence of aneurysm, dissection, vasculitis or significant stenosis. Mild tortuosity. Proximal Outflow: Bilateral common  femoral and visualized portions of the superficial and profunda femoral arteries are patent without evidence of aneurysm, dissection, vasculitis or significant stenosis. Veins: Patent hepatic veins, portal vein, SMV, splenic vein, bilateral renal veins, IVC, and iliac venous systems. Review of the MIP images confirms the above findings. NON-VASCULAR Lower chest: Linear scarring or subsegmental atelectasis medially at both lung bases. No pleural or pericardial effusion. Hepatobiliary: No focal liver abnormality is seen. No gallstones, gallbladder wall thickening, or biliary dilatation. Stable central biliary ductal ectasia. Pancreas: Unremarkable. No pancreatic ductal dilatation or surrounding inflammatory changes. Spleen: Normal in size without focal abnormality. Adrenals/Urinary Tract: Adrenal glands are unremarkable. Kidneys are normal, without renal calculi, focal lesion, or hydronephrosis. Bladder is unremarkable. Stomach/Bowel: Moderate hiatal hernia. The stomach is incompletely distended limiting mucosal assessment. Small bowel is nondilated. Appendix not discretely identified. Scattered colonic diverticula without adjacent inflammatory/ edematous change. Some wall thickening in the mid sigmoid colon, nonspecific, with some small regional mesenteric nodes. Lymphatic: Subcentimeter left para-aortic and aortocaval lymph nodes. No mesenteric or pelvic adenopathy. Reproductive: Status post hysterectomy. No adnexal masses. Other: No ascites.  No free air. Musculoskeletal: Chronic Interbody fusion T12-L2, and Spondylitic changes throughout the visualized lower thoracic and lumbar spine. No fracture or worrisome bone lesion. IMPRESSION: VASCULAR 1. No significant proximal mesenteric arterial occlusive disease or other significant vascular findings. NON-VASCULAR 1. Scattered colonic diverticulosis without CT evidence of diverticulitis, abscess, or active extravasation. Correlate with colonoscopic findings. 2. Moderate  hiatal hernia.  Correlate with endoscopic findings. Electronically Signed   By: Lucrezia Europe M.D.   On: 08/08/2016 14:21        Scheduled Meds: . lidocaine  1 patch Transdermal Q24H  . multivitamin-prenatal  1 tablet Oral Daily  . polyvinyl alcohol  1 drop Both Eyes BID  . sodium chloride flush  3 mL Intravenous Q12H  . sodium chloride flush  3 mL Intravenous Q12H  . venlafaxine XR  75 mg Oral Daily   Continuous Infusions: . sodium chloride 75 mL/hr at 08/08/16 2201  . sodium chloride    . pantoprozole (PROTONIX) infusion 8 mg/hr (08/09/16 0412)     LOS: 1 day    Time spent: 56min    Domenic Polite, MD [facility] Pager [phone]  If 7PM-7AM, please contact night-coverage [email or web address] Password [facility] 08/09/2016, 10:34 AM

## 2016-08-09 NOTE — Care Management Note (Addendum)
Case Management Note  Patient Details  Name: ABBAGAYLE ZARAGOZA MRN: [id number] Date of Birth: March 10, 1916  Subjective/Objective:                  Anemia and hypotension  Action/Plan: Date:  August 09, 2016 Chart reviewed for concurrent status and case management needs. Will continue to follow patient progress. Discharge Planning: following for needs/ has private duty caregivers at home from [place] daily. Expected discharge date: [id number] Velva Harman, BSN, [place], [place]  Expected Discharge Date:   (unknown)               Expected Discharge Plan:  Home/Self Care  In-House Referral:     Discharge planning Services  CM Consult  Post Acute Care Choice:    Choice offered to:     DME Arranged:    DME Agency:     HH Arranged:    HH Agency:     Status of Service:  In process, will continue to follow  If discussed at Long Length of Stay Meetings, dates discussed:    Additional Comments:  Leeroy Cha, RN 08/09/2016, 8:40 AM

## 2016-08-10 DIAGNOSIS — I10 Essential (primary) hypertension: Secondary | ICD-10-CM

## 2016-08-10 DIAGNOSIS — N183 Chronic kidney disease, stage 3 (moderate): Secondary | ICD-10-CM

## 2016-08-10 LAB — CBC
HEMATOCRIT: 33.3 % — AB (ref 36.0–46.0)
HEMOGLOBIN: 11.1 g/dL — AB (ref 12.0–15.0)
MCH: 29 pg (ref 26.0–34.0)
MCHC: 33.3 g/dL (ref 30.0–36.0)
MCV: 86.9 fL (ref 78.0–100.0)
Platelets: 189 10*3/uL (ref 150–400)
RBC: 3.83 MIL/uL — AB (ref 3.87–5.11)
RDW: 16.9 % — ABNORMAL HIGH (ref 11.5–15.5)
WBC: 15.2 10*3/uL — ABNORMAL HIGH (ref 4.0–10.5)

## 2016-08-10 LAB — BASIC METABOLIC PANEL
Anion gap: 7 (ref 5–15)
BUN: 22 mg/dL — AB (ref 6–20)
CHLORIDE: 114 mmol/L — AB (ref 101–111)
CO2: 21 mmol/L — AB (ref 22–32)
Calcium: 8.6 mg/dL — ABNORMAL LOW (ref 8.9–10.3)
Creatinine, Ser: 0.82 mg/dL (ref 0.44–1.00)
GFR calc Af Amer: >60 mL/min (ref >60)
GFR calc non Af Amer: 58 mL/min — ABNORMAL LOW (ref >60)
GLUCOSE: 101 mg/dL — AB (ref 65–99)
Potassium: 3.6 mmol/L (ref 3.5–5.1)
Sodium: 142 mmol/L (ref 135–145)

## 2016-08-10 MED ORDER — HYDRALAZINE HCL 20 MG/ML IJ SOLN
5.0000 mg | INTRAMUSCULAR | Status: DC | PRN
  Filled 2016-08-10: qty 0.25

## 2016-08-10 MED ORDER — ISOSORBIDE MONONITRATE ER 30 MG PO TB24
30.0000 mg | ORAL_TABLET | Freq: Every day | ORAL | Status: DC
  Administered 2016-08-10 – 2016-08-12 (×3): 30 mg via ORAL
  Filled 2016-08-10 (×3): qty 1

## 2016-08-10 MED ORDER — ATENOLOL 25 MG PO TABS
12.5000 mg | ORAL_TABLET | Freq: Every day | ORAL | Status: DC
  Administered 2016-08-10 – 2016-08-12 (×3): 12.5 mg via ORAL
  Filled 2016-08-10 (×3): qty 1

## 2016-08-10 MED ORDER — PANTOPRAZOLE SODIUM 40 MG PO TBEC
40.0000 mg | DELAYED_RELEASE_TABLET | Freq: Two times a day (BID) | ORAL | Status: DC
  Administered 2016-08-10 – 2016-08-12 (×5): 40 mg via ORAL
  Filled 2016-08-10 (×5): qty 1

## 2016-08-10 NOTE — Progress Notes (Signed)
[name] 10:00 AM  Subjective: Patient without any new complaints doesn't feel well does not have an appetite but no signs of bleeding case discussed with my partner hospital computer chart reviewed  Objective: Vital signs stable afebrile no acute distress abdomen is sore occasional bowel sounds no guarding or rebound hemoglobin stable white count still increased BUN and creatinine improved CTA on admission okay  Assessment: GI bleeding probably from ulcers patient declined endoscopy  Plan: Please call me this weekend if I can be of any further assistance with this hospital stay otherwise continue pump inhibitors avoid aspirin and nonsteroidals and slowly advance diet and continue workup for elevated white count per hospital team  [facility]  Pager [phone] After 5PM or if no answer call [phone]

## 2016-08-10 NOTE — Evaluation (Signed)
Physical Therapy Evaluation Patient Details Name: [name] MRN: [id number] DOB: Mar 02, 1916 Today's Date: 08/10/2016   History of Present Illness  [name] is a 83 y.o. female with medical history significant of ambulatory dysfunction, congestive heart failure, chronic kidney disease stage III, coronary artery disease, DJD who presented with the chief complaint of back pain.  Pt found to have GI bleed with anemia.  Clinical Impression  Pt admitted with above diagnosis. Pt currently with functional limitations due to the deficits listed below (see PT Problem List).  Pt will benefit from skilled PT to increase their independence and safety with mobility to allow discharge to the venue listed below.  Pt lives alone and fatigues quickly  Recommend SNF and pt is in agreement.     Follow Up Recommendations SNF;Supervision/Assistance - 24 hour    Equipment Recommendations  None recommended by PT    Recommendations for Other Services       Precautions / Restrictions Precautions Precautions: Fall Restrictions Weight Bearing Restrictions: No      Mobility  Bed Mobility Overal bed mobility: Needs Assistance Bed Mobility: Supine to Sit;Sit to Supine     Supine to sit: Min assist Sit to supine: Min guard   General bed mobility comments: MIN A  to get to EOB.  Pt had been incontinent of stool in the bed.  Transfers Overall transfer level: Needs assistance   Transfers: Sit to/from Stand;Stand Pivot Transfers Sit to Stand: Min assist Stand pivot transfers: Min assist       General transfer comment: MIN A to power up and for SPT to [facility].  Ambulation/Gait             General Gait Details: deferred due to fatigue  Stairs            Wheelchair Mobility    Modified Rankin (Stroke Patients Only)       Balance Overall balance assessment: Needs assistance   Sitting balance-Leahy Scale: Good       Standing balance-Leahy Scale: Poor Standing  balance comment: Requires UE support.  Stood at [employer] while being cleaned after loose BM.                             Pertinent Vitals/Pain Pain Assessment: No/denies pain    Home Living Family/patient expects to be discharged to:: Private residence Living Arrangements: Alone Available Help at Discharge: Personal care attendant (aides 9 AM-noon, 1-3, and 6-9pm) Type of Home: Other(Comment) (condo) Home Access: Level entry     Home Layout: One level Home Equipment: Cane - single point;Walker - 4 wheels Additional Comments: Pt bought her rollator day before admission and has not used it yet    Prior Function Level of Independence: Independent with assistive device(s)         Comments: Amb with cane, but has been feeling unsteady so she just bought a rollator, but has not used     Hand Dominance        Extremity/Trunk Assessment   Upper Extremity Assessment Upper Extremity Assessment: Generalized weakness    Lower Extremity Assessment Lower Extremity Assessment: Generalized weakness    Cervical / Trunk Assessment Cervical / Trunk Assessment: Kyphotic  Communication   Communication: HOH  Cognition Arousal/Alertness: Awake/alert Behavior During Therapy: WFL for tasks assessed/performed Overall Cognitive Status: Within Functional Limits for tasks assessed  General Comments      Exercises     Assessment/Plan    PT Assessment Patient needs continued PT services  PT Problem List Decreased strength;Decreased activity tolerance;Decreased balance;Decreased mobility       PT Treatment Interventions DME instruction;Gait training;Functional mobility training;Therapeutic activities;Therapeutic exercise;Balance training;Patient/family education    PT Goals (Current goals can be found in the Care Plan section)  Acute Rehab PT Goals Patient Stated Goal: to go to rehab PT Goal Formulation: With  patient Time For Goal Achievement: 08/17/16 Potential to Achieve Goals: Good    Frequency Min 3X/week   Barriers to discharge Decreased caregiver support      Co-evaluation               AM-PAC PT "6 Clicks" Daily Activity  Outcome Measure Difficulty turning over in bed (including adjusting bedclothes, sheets and blankets)?: A Little Difficulty moving from lying on back to sitting on the side of the bed? : Total Difficulty sitting down on and standing up from a chair with arms (e.g., wheelchair, bedside commode, etc,.)?: Total Help needed moving to and from a bed to chair (including a wheelchair)?: A Little Help needed walking in hospital room?: A Little Help needed climbing 3-5 steps with a railing? : A Lot 6 Click Score: 13    End of Session Equipment Utilized During Treatment: Gait belt Activity Tolerance: Patient limited by fatigue Patient left: in bed;with call bell/phone within reach;with bed alarm set Nurse Communication: Mobility status PT Visit Diagnosis: Unsteadiness on feet (R26.81);Difficulty in walking, not elsewhere classified (R26.2);Muscle weakness (generalized) (M62.81)    Time: 9201-0071 PT Time Calculation (min) (ACUTE ONLY): 26 min   Charges:   PT Evaluation $PT Eval Low Complexity: 1 Procedure PT Treatments $Therapeutic Activity: 8-22 mins   PT G Codes:        Heidie Krall L. Tamala Julian, [place] Pager [phone] 08/10/2016   Galen Manila 08/10/2016, 4:23 PM

## 2016-08-10 NOTE — Progress Notes (Signed)
PROGRESS NOTE    [name]  CBJ:[id number] DOB: Aug 08, 1916 DOA: 08/08/2016 PCP: Josetta Huddle, MD  Brief Narrative:[name] is a 83 y.o. female with medical history significant of ambulatory dysfunction, congestive heart failure, chronic kidney disease stage III, coronary artery disease, DJD who presented with the chief complaint of back pain. For the last 2 weeks patient has been experiencing worsening back pain, initially intermittent but over last 7 days more persistent, she has been placed on prednisone and ibuprofen, she recently received a in the injection of steroids with no significant improvement of her back pain. The pain is at the lower back, moderate to severe intensity, sharp in nature, mildly improved with NSAIDs and steroids, worse with ambulation and movement. It has been associated with dark stools, lightheadedness, dizziness and generalized weakness she presented 7/12 due to worsening symptoms, in ED Found to be  hypotensive and anemia, referred for admission. GI was consulted.    Assessment & Plan:  1. Upper GI bleed with acute blood loss anemia.  -likely NSAID/prednisone related PUD or gastritis -due to advanced age and debility, plan for conservative mgt, no plan for Endoscopy at this time, this was discussed with pt and family and they are in agreement -stable, no further bleeding and Hb stable, change to PPI BID PO -CBC in am -advance diet, ambulate, OOB, Tx to floor  2. Acute on chronic back pain.  -due to advanced DJD, noted on Xrays and CT abd pelvis done last pm -prior h/o thoraco-lumbar fusion -supportive care, no NSAIDs, tylenol, lidocaine patch, vicodin PRN, stopped IV morphine -PT eval pending  3. Hypertension.  -Will hold on furosemide,  -resume atenolol at lower dose and imdur today, monitor HR  4. Depression. : Continue venlafaxine  DVT prophylaxis: SCDs  Full COde Family Communication: none at bedside Disposition Plan: Tx to  floor, home in 1-2days if stable  Consultants:   [facility]    Subjective: Feels ok, no further bleeding, denies any abd pain, back ok  Objective: Vitals:   08/10/16 0645 08/10/16 0700 08/10/16 0734 08/10/16 0800  BP: (!) 164/58 (!) 181/74 (!) 167/61 (!) 156/64  Pulse: (!) 58 62 60 67  Resp: 13 17 14 19   Temp:      TempSrc:      SpO2: 98% 99% 98% 98%  Weight:      Height:        Intake/Output Summary (Last 24 hours) at 08/10/16 1058 Last data filed at 08/10/16 0700  Gross per 24 hour  Intake              515 ml  Output             1075 ml  Net             -560 ml   Filed Weights   08/08/16 1815  Weight: 63.5 kg (139 lb 15.9 oz)    Examination:  Gen: Awake, Alert, Oriented X 2, calm and comfortable, elderly frail  HEENT: PERRLA, Neck supple, no JVD Lungs: Good air movement bilaterally, CTAB CVS: RRR,No Gallops,Rubs or new Murmurs Abd: soft, Non tender, non distended, BS present Extremities: No Cyanosis, Clubbing or edema Skin: no new rashes  Data Reviewed:   CBC:  Recent Labs Lab 08/04/16 2221 08/08/16 1047 08/08/16 2346 08/09/16 0704 08/09/16 1727 08/10/16 0357  WBC 11.6* 30.0*  --   --  17.0* 15.2*  HGB 11.4* 7.9* 10.3* 10.4* 11.5* 11.1*  HCT 34.7* 23.8* 30.1* 30.4* 34.4*  33.3*  MCV 89.9 93.7  --   --  86.6 86.9  PLT 188 235  --   --  186 979   Basic Metabolic Panel:  Recent Labs Lab 08/04/16 2221 08/08/16 1047 08/09/16 0816 08/10/16 0357  NA 139 144 145 142  K 3.5 3.9 3.3* 3.6  CL 106 113* 117* 114*  CO2 22 17* 21* 21*  GLUCOSE 104* 241* 96 101*  BUN 12 67* 36* 22*  CREATININE 0.97 1.13* 0.92 0.82  CALCIUM 8.7* 9.1 8.4* 8.6*   GFR: Estimated Creatinine Clearance: 36.7 mL/min (by C-G formula based on SCr of 0.82 mg/dL). Liver Function Tests:  Recent Labs Lab 08/04/16 2221 08/08/16 1047  AST 24 24  ALT 12* 13*  ALKPHOS 70 53  BILITOT 0.5 0.3  PROT 6.6 5.8*  ALBUMIN 2.9* 2.8*   No results for input(s): LIPASE, AMYLASE in the  last 168 hours. No results for input(s): AMMONIA in the last 168 hours. Coagulation Profile:  Recent Labs Lab 08/08/16 1145  INR 1.18   Cardiac Enzymes: No results for input(s): CKTOTAL, CKMB, CKMBINDEX, TROPONINI in the last 168 hours. BNP (last 3 results)  Recent Labs  05/28/16 1153  PROBNP 411   HbA1C: No results for input(s): HGBA1C in the last 72 hours. CBG: No results for input(s): GLUCAP in the last 168 hours. Lipid Profile: No results for input(s): CHOL, HDL, LDLCALC, TRIG, CHOLHDL, LDLDIRECT in the last 72 hours. Thyroid Function Tests: No results for input(s): TSH, T4TOTAL, FREET4, T3FREE, THYROIDAB in the last 72 hours. Anemia Panel: No results for input(s): VITAMINB12, FOLATE, FERRITIN, TIBC, IRON, RETICCTPCT in the last 72 hours. Urine analysis:    Component Value Date/Time   COLORURINE YELLOW 08/08/2016 1310   APPEARANCEUR HAZY (A) 08/08/2016 1310   LABSPEC 1.020 08/08/2016 1310   PHURINE 5.0 08/08/2016 1310   GLUCOSEU NEGATIVE 08/08/2016 1310   HGBUR LARGE (A) 08/08/2016 1310   BILIRUBINUR NEGATIVE 08/08/2016 1310   KETONESUR NEGATIVE 08/08/2016 1310   PROTEINUR NEGATIVE 08/08/2016 1310   UROBILINOGEN 0.2 09/18/2014 1600   NITRITE NEGATIVE 08/08/2016 1310   LEUKOCYTESUR NEGATIVE 08/08/2016 1310   Sepsis Labs: @LABRCNTIP (procalcitonin:4,lacticidven:4)  ) Recent Results (from the past 240 hour(s))  Blood Culture (routine x 2)     Status: None (Preliminary result)   Collection Time: 08/08/16 11:45 AM  Result Value Ref Range Status   Specimen Description BLOOD BLOOD RIGHT HAND  Final   Special Requests IN PEDIATRIC BOTTLE Blood Culture adequate volume  Final   Culture   Final    NO GROWTH 2 DAYS Performed at [facility], [place] [address], [place], [place] [address]    Report Status PENDING  Incomplete  Blood Culture (routine x 2)     Status: None (Preliminary result)   Collection Time: 08/08/16 11:46 AM  Result Value Ref Range Status    Specimen Description BLOOD BLOOD LEFT HAND  Final   Special Requests IN PEDIATRIC BOTTLE Blood Culture adequate volume  Final   Culture   Final    NO GROWTH 2 DAYS Performed at [facility], [place] [address] [place], [place] [address]    Report Status PENDING  Incomplete  Urine culture     Status: Abnormal   Collection Time: 08/08/16  1:10 PM  Result Value Ref Range Status   Specimen Description URINE, CLEAN CATCH  Final   Special Requests NONE  Final   Culture MULTIPLE ORGANISMS PRESENT, NONE PREDOMINANT (A)  Final   Report  Status 08/09/2016 FINAL  Final  MRSA PCR Screening     Status: None   Collection Time: 08/08/16  5:32 PM  Result Value Ref Range Status   MRSA by PCR NEGATIVE NEGATIVE Final    Comment:        The GeneXpert MRSA Assay (FDA approved for NASAL specimens only), is one component of a comprehensive MRSA colonization surveillance program. It is not intended to diagnose MRSA infection nor to guide or monitor treatment for MRSA infections.          Radiology Studies: Dg Abdomen Acute W/chest  Result Date: 08/08/2016 CLINICAL DATA:  Mid abdominal pain, nausea EXAM: DG ABDOMEN ACUTE W/ 1V CHEST COMPARISON:  08/04/2016 FINDINGS: The bowel gas pattern is normal. There is no evidence of free intraperitoneal air. No suspicious radio-opaque calculi or other significant radiographic abnormality is seen. Heart size and mediastinal contours are within normal limits. Both lungs are clear. Scoliosis and degenerative changes in the lumbar spine. IMPRESSION: No evidence of bowel obstruction or free air. No active cardiopulmonary disease. Electronically Signed   By: Rolm Baptise M.D.   On: 08/08/2016 12:14   Ct Angio Abd/pel W And/or Wo Contrast  Result Date: 08/08/2016 CLINICAL DATA:  dark tarry stools per HH. On antibiotics for UTI currently. BP possibly 60 systolic by EMS upon arrival. Alert and oriented.Upper abd pain since Sunday with n/v and blood in stools;lactic  acidosis; eval for ishemic bowel EXAM: CTA ABDOMEN AND PELVIS WITH CONTRAST TECHNIQUE: Multidetector CT imaging of the abdomen and pelvis was performed using the standard protocol during bolus administration of intravenous contrast. Multiplanar reconstructed images and MIPs were obtained and reviewed to evaluate the vascular anatomy. CONTRAST:  80 mL Isovue 370 IV COMPARISON:  09/12/2014 FINDINGS: VASCULAR Aorta: Minimal plaque. Moderately tortuous. No dissection, aneurysm, or stenosis. Celiac: Mild proximal narrowing at the level of the median arcuate ligament of the diaphragm, patent distally with unremarkable branching. SMA: Patent without evidence of aneurysm, dissection, vasculitis or significant stenosis. Visualized distal branching unremarkable. Renals: Both renal arteries are patent without evidence of aneurysm, dissection, vasculitis, fibromuscular dysplasia or significant stenosis. IMA: Patent without evidence of aneurysm, dissection, vasculitis or significant stenosis. Inflow: Patent without evidence of aneurysm, dissection, vasculitis or significant stenosis. Mild tortuosity. Proximal Outflow: Bilateral common femoral and visualized portions of the superficial and profunda femoral arteries are patent without evidence of aneurysm, dissection, vasculitis or significant stenosis. Veins: Patent hepatic veins, portal vein, SMV, splenic vein, bilateral renal veins, IVC, and iliac venous systems. Review of the MIP images confirms the above findings. NON-VASCULAR Lower chest: Linear scarring or subsegmental atelectasis medially at both lung bases. No pleural or pericardial effusion. Hepatobiliary: No focal liver abnormality is seen. No gallstones, gallbladder wall thickening, or biliary dilatation. Stable central biliary ductal ectasia. Pancreas: Unremarkable. No pancreatic ductal dilatation or surrounding inflammatory changes. Spleen: Normal in size without focal abnormality. Adrenals/Urinary Tract: Adrenal  glands are unremarkable. Kidneys are normal, without renal calculi, focal lesion, or hydronephrosis. Bladder is unremarkable. Stomach/Bowel: Moderate hiatal hernia. The stomach is incompletely distended limiting mucosal assessment. Small bowel is nondilated. Appendix not discretely identified. Scattered colonic diverticula without adjacent inflammatory/ edematous change. Some wall thickening in the mid sigmoid colon, nonspecific, with some small regional mesenteric nodes. Lymphatic: Subcentimeter left para-aortic and aortocaval lymph nodes. No mesenteric or pelvic adenopathy. Reproductive: Status post hysterectomy. No adnexal masses. Other: No ascites.  No free air. Musculoskeletal: Chronic Interbody fusion T12-L2, and Spondylitic changes throughout the visualized lower thoracic and lumbar  spine. No fracture or worrisome bone lesion. IMPRESSION: VASCULAR 1. No significant proximal mesenteric arterial occlusive disease or other significant vascular findings. NON-VASCULAR 1. Scattered colonic diverticulosis without CT evidence of diverticulitis, abscess, or active extravasation. Correlate with colonoscopic findings. 2. Moderate hiatal hernia.  Correlate with endoscopic findings. Electronically Signed   By: Lucrezia Europe M.D.   On: 08/08/2016 14:21        Scheduled Meds: . lidocaine  1 patch Transdermal Q24H  . multivitamin-prenatal  1 tablet Oral Daily  . pantoprazole  40 mg Oral BID  . polyvinyl alcohol  1 drop Both Eyes BID  . sodium chloride flush  3 mL Intravenous Q12H  . sodium chloride flush  3 mL Intravenous Q12H  . venlafaxine XR  75 mg Oral Daily   Continuous Infusions: . sodium chloride 10 mL/hr at 08/09/16 1944  . sodium chloride       LOS: 2 days    Time spent: 28min    Domenic Polite, MD [facility] Pager [phone]  If 7PM-7AM, please contact night-coverage [email or web address] Password [facility] 08/10/2016, 10:58 AM

## 2016-08-11 LAB — URINALYSIS, ROUTINE W REFLEX MICROSCOPIC
Bacteria, UA: NONE SEEN
Bilirubin Urine: NEGATIVE
Glucose, UA: NEGATIVE mg/dL
Ketones, ur: NEGATIVE mg/dL
Nitrite: NEGATIVE
Protein, ur: NEGATIVE mg/dL
Specific Gravity, Urine: 1.016 (ref 1.005–1.030)
pH: 5 (ref 5.0–8.0)

## 2016-08-11 LAB — CBC
HCT: 32.5 % — ABNORMAL LOW (ref 36.0–46.0)
Hemoglobin: 10.7 g/dL — ABNORMAL LOW (ref 12.0–15.0)
MCH: 28.9 pg (ref 26.0–34.0)
MCHC: 32.9 g/dL (ref 30.0–36.0)
MCV: 87.8 fL (ref 78.0–100.0)
Platelets: 198 K/uL (ref 150–400)
RBC: 3.7 MIL/uL — ABNORMAL LOW (ref 3.87–5.11)
RDW: 16.3 % — ABNORMAL HIGH (ref 11.5–15.5)
WBC: 21 K/uL — ABNORMAL HIGH (ref 4.0–10.5)

## 2016-08-11 LAB — BASIC METABOLIC PANEL WITH GFR
Anion gap: 8 (ref 5–15)
BUN: 15 mg/dL (ref 6–20)
CO2: 21 mmol/L — ABNORMAL LOW (ref 22–32)
Calcium: 8.4 mg/dL — ABNORMAL LOW (ref 8.9–10.3)
Chloride: 111 mmol/L (ref 101–111)
Creatinine, Ser: 0.79 mg/dL (ref 0.44–1.00)
GFR calc Af Amer: >60 mL/min
GFR calc non Af Amer: >60 mL/min
Glucose, Bld: 122 mg/dL — ABNORMAL HIGH (ref 65–99)
Potassium: 3.3 mmol/L — ABNORMAL LOW (ref 3.5–5.1)
Sodium: 140 mmol/L (ref 135–145)

## 2016-08-11 MED ORDER — PRENATAL MULTIVITAMIN CH
1.0000 | ORAL_TABLET | Freq: Every day | ORAL | Status: DC
  Filled 2016-08-11: qty 1

## 2016-08-11 NOTE — Progress Notes (Signed)
PROGRESS NOTE    [name]  XVQ:[id number] DOB: 03/30/1916 DOA: 08/08/2016 PCP: Josetta Huddle, MD  Brief Narrative:[name] is a 83 y.o. female with medical history significant of ambulatory dysfunction, congestive heart failure, chronic kidney disease stage III, coronary artery disease, DJD who presented with the chief complaint of back pain. For the last 2 weeks patient has been experiencing worsening back pain, initially intermittent but over last 7 days more persistent, she has been placed on prednisone and ibuprofen, she recently received a in the injection of steroids with no significant improvement of her back pain. The pain is at the lower back, moderate to severe intensity, sharp in nature, mildly improved with NSAIDs and steroids, worse with ambulation and movement. It has been associated with dark stools, lightheadedness, dizziness and generalized weakness she presented 7/12 due to worsening symptoms, in ED Found to be  hypotensive and anemia, referred for admission. GI was consulted.    Assessment & Plan:  1. Upper GI bleed with acute blood loss anemia.  -likely NSAID/prednisone related PUD or gastritis -due to advanced age and debility, plan for conservative mgt, no plan for Endoscopy at this time, this was discussed with pt and family and they are in agreement -stable, no further bleeding and Hb stable, changed to PPI BID PO -advance diet, ambulate, OOB, Tx to floor  2. Acute on chronic back pain.  -due to advanced DJD, noted on Xrays and CT abd pelvis done last pm -prior h/o thoraco-lumbar fusion -supportive care, no NSAIDs, tylenol, lidocaine patch, vicodin PRN, stopped IV morphine -PT eval completed SNF recommended  3. Leukocytosis -etiology unclear, lungs clear and no symptoms, some urinary retention noted -check UA and Urine Cx -afebrile and nontoxic, monitor -abd exam benign  4. Hypertension.  -holding furosemide,  -resumed atenolol at lower dose  and imdur   4. Depression. : Continue venlafaxine  DVT prophylaxis: SCDs  Full COde Family Communication: none at bedside Disposition Plan: Tx to floor, home in 1-2days if stable  Consultants:   [facility]    Subjective: Feels ok, no further bleeding, denies any abd pain, back ok  Objective: Vitals:   08/10/16 1450 08/10/16 2153 08/11/16 0500 08/11/16 0857  BP: (!) 149/62 (!) 127/49 (!) 124/57 (!) 124/56  Pulse: 67 60 63 63  Resp: 18 18 20    Temp: 98.1 F (36.7 C) 98.6 F (37 C) 98.2 F (36.8 C)   TempSrc: Oral Oral Oral   SpO2: 99% 97% 97%   Weight:      Height:        Intake/Output Summary (Last 24 hours) at 08/11/16 1151 Last data filed at 08/11/16 0800  Gross per 24 hour  Intake              360 ml  Output             1000 ml  Net             -640 ml   Filed Weights   08/08/16 1815 08/10/16 1045  Weight: 63.5 kg (139 lb 15.9 oz) 68 kg (150 lb)    Examination:  Gen: Awake, Alert, Oriented X 2, calm and comfortable, elderly frail  HEENT: PERRLA, Neck supple, no JVD Lungs: Good air movement bilaterally, CTAB CVS: RRR,No Gallops,Rubs or new Murmurs Abd: soft, Non tender, non distended, BS present Extremities: No Cyanosis, Clubbing or edema Skin: no new rashes  Data Reviewed:   CBC:  Recent Labs Lab 08/04/16 2221 08/08/16 1047  08/08/16 2346 08/09/16 0704 08/09/16 1727 08/10/16 0357 08/11/16 0436  WBC 11.6* 30.0*  --   --  17.0* 15.2* 21.0*  HGB 11.4* 7.9* 10.3* 10.4* 11.5* 11.1* 10.7*  HCT 34.7* 23.8* 30.1* 30.4* 34.4* 33.3* 32.5*  MCV 89.9 93.7  --   --  86.6 86.9 87.8  PLT 188 235  --   --  186 189 932   Basic Metabolic Panel:  Recent Labs Lab 08/04/16 2221 08/08/16 1047 08/09/16 0816 08/10/16 0357 08/11/16 0436  NA 139 144 145 142 140  K 3.5 3.9 3.3* 3.6 3.3*  CL 106 113* 117* 114* 111  CO2 22 17* 21* 21* 21*  GLUCOSE 104* 241* 96 101* 122*  BUN 12 67* 36* 22* 15  CREATININE 0.97 1.13* 0.92 0.82 0.79  CALCIUM 8.7* 9.1 8.4*  8.6* 8.4*   GFR: Estimated Creatinine Clearance: 37.6 mL/min (by C-G formula based on SCr of 0.79 mg/dL). Liver Function Tests:  Recent Labs Lab 08/04/16 2221 08/08/16 1047  AST 24 24  ALT 12* 13*  ALKPHOS 70 53  BILITOT 0.5 0.3  PROT 6.6 5.8*  ALBUMIN 2.9* 2.8*   No results for input(s): LIPASE, AMYLASE in the last 168 hours. No results for input(s): AMMONIA in the last 168 hours. Coagulation Profile:  Recent Labs Lab 08/08/16 1145  INR 1.18   Cardiac Enzymes: No results for input(s): CKTOTAL, CKMB, CKMBINDEX, TROPONINI in the last 168 hours. BNP (last 3 results)  Recent Labs  05/28/16 1153  PROBNP 411   HbA1C: No results for input(s): HGBA1C in the last 72 hours. CBG: No results for input(s): GLUCAP in the last 168 hours. Lipid Profile: No results for input(s): CHOL, HDL, LDLCALC, TRIG, CHOLHDL, LDLDIRECT in the last 72 hours. Thyroid Function Tests: No results for input(s): TSH, T4TOTAL, FREET4, T3FREE, THYROIDAB in the last 72 hours. Anemia Panel: No results for input(s): VITAMINB12, FOLATE, FERRITIN, TIBC, IRON, RETICCTPCT in the last 72 hours. Urine analysis:    Component Value Date/Time   COLORURINE YELLOW 08/08/2016 1310   APPEARANCEUR HAZY (A) 08/08/2016 1310   LABSPEC 1.020 08/08/2016 1310   PHURINE 5.0 08/08/2016 1310   GLUCOSEU NEGATIVE 08/08/2016 1310   HGBUR LARGE (A) 08/08/2016 1310   BILIRUBINUR NEGATIVE 08/08/2016 1310   KETONESUR NEGATIVE 08/08/2016 1310   PROTEINUR NEGATIVE 08/08/2016 1310   UROBILINOGEN 0.2 09/18/2014 1600   NITRITE NEGATIVE 08/08/2016 1310   LEUKOCYTESUR NEGATIVE 08/08/2016 1310   Sepsis Labs: @LABRCNTIP (procalcitonin:4,lacticidven:4)  ) Recent Results (from the past 240 hour(s))  Blood Culture (routine x 2)     Status: None (Preliminary result)   Collection Time: 08/08/16 11:45 AM  Result Value Ref Range Status   Specimen Description BLOOD BLOOD RIGHT HAND  Final   Special Requests IN PEDIATRIC BOTTLE Blood  Culture adequate volume  Final   Culture   Final    NO GROWTH 2 DAYS Performed at [facility], [place] [address] [place], [place] [address]    Report Status PENDING  Incomplete  Blood Culture (routine x 2)     Status: None (Preliminary result)   Collection Time: 08/08/16 11:46 AM  Result Value Ref Range Status   Specimen Description BLOOD BLOOD LEFT HAND  Final   Special Requests IN PEDIATRIC BOTTLE Blood Culture adequate volume  Final   Culture   Final    NO GROWTH 2 DAYS Performed at [facility], [place] [address], [place], [place] [address]    Report Status PENDING  Incomplete  Urine culture     Status: Abnormal   Collection Time: 08/08/16  1:10 PM  Result Value Ref Range Status   Specimen Description URINE, CLEAN CATCH  Final   Special Requests NONE  Final   Culture MULTIPLE ORGANISMS PRESENT, NONE PREDOMINANT (A)  Final   Report Status 08/09/2016 FINAL  Final  MRSA PCR Screening     Status: None   Collection Time: 08/08/16  5:32 PM  Result Value Ref Range Status   MRSA by PCR NEGATIVE NEGATIVE Final    Comment:        The GeneXpert MRSA Assay (FDA approved for NASAL specimens only), is one component of a comprehensive MRSA colonization surveillance program. It is not intended to diagnose MRSA infection nor to guide or monitor treatment for MRSA infections.          Radiology Studies: No results found.      Scheduled Meds: . atenolol  12.5 mg Oral Daily  . isosorbide mononitrate  30 mg Oral Daily  . lidocaine  1 patch Transdermal Q24H  . multivitamin-prenatal  1 tablet Oral Daily  . pantoprazole  40 mg Oral BID  . polyvinyl alcohol  1 drop Both Eyes BID  . sodium chloride flush  3 mL Intravenous Q12H  . venlafaxine XR  75 mg Oral Daily   Continuous Infusions: . sodium chloride 10 mL/hr at 08/09/16 1944     LOS: 3 days    Time spent: 109min    Domenic Polite, MD [facility] Pager [phone]  If 7PM-7AM, please contact  night-coverage [email or web address] Password [facility] 08/11/2016, 11:51 AM

## 2016-08-11 NOTE — Clinical Social Work Note (Signed)
Clinical Social Work Assessment  Patient Details  Name: [name] MRN: [id number] Date of Birth: 01/06/1917  Date of referral:  08/11/16               Reason for consult:  Facility Placement                Permission sought to share information with:  Facility Sport and exercise psychologist Permission granted to share information::  Yes, Verbal Permission Granted  Name::     [facility], [phone]  Agency::  SNF  Relationship::     Contact Information:     Housing/Transportation Living arrangements for the past 2 months:  Apartment Source of Information:  Patient Patient Interpreter Needed:  None Criminal Activity/Legal Involvement Pertinent to Current Situation/Hospitalization:  No - Comment as needed Significant Relationships:  Other Family Members Lives with:  Self Do you feel safe going back to the place where you live?  No Need for family participation in patient care:  Yes (Comment)  Care giving concerns:  Patient from home alone with complaints of back pain.  She used a cane at home for ambulation.  She is not safe to return home at this time and will need short term rehab.  Social Worker assessment / plan:  CSW met with patient at bedside to discuss clinical teams recommendation for Short term rehab.  Pt in agreement with recommendation and gave CSW permission to call nephew [name], [phone]. CSW spoke with nephew and he agreed with recommendations for short term rehab. CSW will send out offers to [place] area.   FL2 complete. passr obtained. Offers sent.  Employment status:  Retired Forensic scientist:  Medicare PT Recommendations:  [place] / Referral to community resources:  [place]  Patient/Family's Response to care:  Patient reports no issues with care at this time.  Patient/Family's Understanding of and Emotional Response to Diagnosis, Current Treatment, and Prognosis:  Patient has some understanding  of her diagnosis, current treatment and prognosis. She is hopeful that she will improve with short term rehab.  No issues or concerns reported.  Emotional Assessment Appearance:  Appears stated age Attitude/Demeanor/Rapport:   (Cooperative) Affect (typically observed):  Accepting, Appropriate Orientation:    Alcohol / Substance use:  Not Applicable Psych involvement (Current and /or in the community):  No (Comment)  Discharge Needs  Concerns to be addressed:  Care Coordination Readmission within the last 30 days:  No Current discharge risk:  Physical Impairment, Dependent with Mobility Barriers to Discharge:  No Barriers Identified   Normajean Baxter, LCSW 08/11/2016, 12:24 PM

## 2016-08-12 DIAGNOSIS — E872 Acidosis: Secondary | ICD-10-CM | POA: Diagnosis not present

## 2016-08-12 DIAGNOSIS — R41841 Cognitive communication deficit: Secondary | ICD-10-CM | POA: Diagnosis not present

## 2016-08-12 DIAGNOSIS — L988 Other specified disorders of the skin and subcutaneous tissue: Secondary | ICD-10-CM | POA: Diagnosis not present

## 2016-08-12 DIAGNOSIS — M47815 Spondylosis without myelopathy or radiculopathy, thoracolumbar region: Secondary | ICD-10-CM | POA: Diagnosis not present

## 2016-08-12 DIAGNOSIS — I10 Essential (primary) hypertension: Secondary | ICD-10-CM | POA: Diagnosis not present

## 2016-08-12 DIAGNOSIS — M545 Low back pain: Secondary | ICD-10-CM | POA: Diagnosis not present

## 2016-08-12 DIAGNOSIS — R2681 Unsteadiness on feet: Secondary | ICD-10-CM | POA: Diagnosis not present

## 2016-08-12 DIAGNOSIS — R278 Other lack of coordination: Secondary | ICD-10-CM | POA: Diagnosis not present

## 2016-08-12 DIAGNOSIS — M79602 Pain in left arm: Secondary | ICD-10-CM | POA: Diagnosis not present

## 2016-08-12 DIAGNOSIS — M6281 Muscle weakness (generalized): Secondary | ICD-10-CM | POA: Diagnosis not present

## 2016-08-12 DIAGNOSIS — D500 Iron deficiency anemia secondary to blood loss (chronic): Secondary | ICD-10-CM | POA: Diagnosis not present

## 2016-08-12 DIAGNOSIS — E440 Moderate protein-calorie malnutrition: Secondary | ICD-10-CM | POA: Diagnosis not present

## 2016-08-12 DIAGNOSIS — D649 Anemia, unspecified: Secondary | ICD-10-CM | POA: Diagnosis not present

## 2016-08-12 DIAGNOSIS — K922 Gastrointestinal hemorrhage, unspecified: Secondary | ICD-10-CM | POA: Diagnosis not present

## 2016-08-12 DIAGNOSIS — N183 Chronic kidney disease, stage 3 (moderate): Secondary | ICD-10-CM | POA: Diagnosis not present

## 2016-08-12 LAB — BASIC METABOLIC PANEL
ANION GAP: 9 (ref 5–15)
BUN: 11 mg/dL (ref 6–20)
CALCIUM: 8.2 mg/dL — AB (ref 8.9–10.3)
CO2: 21 mmol/L — ABNORMAL LOW (ref 22–32)
CREATININE: 0.77 mg/dL (ref 0.44–1.00)
Chloride: 108 mmol/L (ref 101–111)
GFR calc non Af Amer: >60 mL/min (ref >60)
Glucose, Bld: 114 mg/dL — ABNORMAL HIGH (ref 65–99)
Potassium: 3.2 mmol/L — ABNORMAL LOW (ref 3.5–5.1)
SODIUM: 138 mmol/L (ref 135–145)

## 2016-08-12 LAB — CBC
HCT: 31.9 % — ABNORMAL LOW (ref 36.0–46.0)
Hemoglobin: 10.7 g/dL — ABNORMAL LOW (ref 12.0–15.0)
MCH: 29.6 pg (ref 26.0–34.0)
MCHC: 33.5 g/dL (ref 30.0–36.0)
MCV: 88.1 fL (ref 78.0–100.0)
Platelets: 189 10*3/uL (ref 150–400)
RBC: 3.62 MIL/uL — ABNORMAL LOW (ref 3.87–5.11)
RDW: 16.2 % — AB (ref 11.5–15.5)
WBC: 14 10*3/uL — AB (ref 4.0–10.5)

## 2016-08-12 MED ORDER — ACETAMINOPHEN 500 MG PO TABS: 500.0000 mg | ORAL_TABLET | Freq: Four times a day (QID) | ORAL | 0 refills | Status: DC | PRN

## 2016-08-12 MED ORDER — PANTOPRAZOLE SODIUM 40 MG PO TBEC: 40.0000 mg | DELAYED_RELEASE_TABLET | Freq: Two times a day (BID) | ORAL | Status: DC

## 2016-08-12 MED ORDER — LOPERAMIDE HCL 2 MG PO CAPS: 2.0000 mg | ORAL_CAPSULE | ORAL | 0 refills | Status: DC | PRN

## 2016-08-12 MED ORDER — HYDROCODONE-ACETAMINOPHEN 5-325 MG PO TABS: 1.0000 | ORAL_TABLET | Freq: Four times a day (QID) | ORAL | 0 refills | Status: DC | PRN

## 2016-08-12 MED ORDER — POTASSIUM CHLORIDE CRYS ER 20 MEQ PO TBCR
40.0000 meq | EXTENDED_RELEASE_TABLET | Freq: Once | ORAL | Status: AC
  Administered 2016-08-12: 40 meq via ORAL
  Filled 2016-08-12: qty 2

## 2016-08-12 NOTE — Clinical Social Work Placement (Signed)
Patient received and accepted bed offer at [facility] SNF.[facility] contacted, family aware. Patient's RN can call report to [phone].  CLINICAL SOCIAL WORK PLACEMENT  NOTE  Date:  08/12/2016  Patient Details  Name: [name] MRN: [id number] Date of Birth: 04-Mar-1916  Clinical Social Work is seeking post-discharge placement for this patient at the [place] level of care (*CSW will initial, date and re-position this form in  chart as items are completed):  Yes   Patient/family provided with [place] Work Department's list of facilities offering this level of care within the geographic area requested by the patient (or if unable, by the patient's family).  Yes   Patient/family informed of their freedom to choose among providers that offer the needed level of care, that participate in Medicare, Medicaid or managed care program needed by the patient, have an available bed and are willing to accept the patient.  Yes   Patient/family informed of [place]'s ownership interest in [facility] and [facility], as well as of the fact that they are under no obligation to receive care at these facilities.  PASRR submitted to EDS on 08/12/16     PASRR number received on 08/12/16     Existing PASRR number confirmed on       FL2 transmitted to all facilities in geographic area requested by pt/family on 08/12/16     FL2 transmitted to all facilities within larger geographic area on       Patient informed that his/her managed care company has contracts with or will negotiate with certain facilities, including the following:        Yes   Patient/family informed of bed offers received.  Patient chooses bed at [place] [place]     Physician recommends and patient chooses bed at      Patient to be transferred to [place], [place] on 08/12/16.  Patient to be transferred to facility by [facility]     Patient family notified on 08/12/16 of  transfer.  Name of family member notified:  [name] [name]     PHYSICIAN       Additional Comment:    _______________________________________________ Burnis Medin, LCSW 08/12/2016, 3:06 PM

## 2016-08-12 NOTE — Evaluation (Signed)
Occupational Therapy Evaluation Patient Details Name: [name] MRN: [id number] DOB: 04-11-1916 Today's Date: 08/12/2016    History of Present Illness [name] is a 83 y.o. female with medical history significant of ambulatory dysfunction, congestive heart failure, chronic kidney disease stage III, coronary artery disease, DJD who presented with the chief complaint of back pain.  Pt found to have GI bleed with anemia.   Clinical Impression   Pt admitted with GI bleed . Pt currently with functional limitations due to the deficits listed below (see OT Problem List).  Pt will benefit from skilled OT to increase their safety and independence with ADL and functional mobility for ADL to facilitate discharge to venue listed below.      Follow Up Recommendations  No OT follow up    Equipment Recommendations  3 in 1 bedside commode       Precautions / Restrictions Restrictions Weight Bearing Restrictions: No      Mobility Bed Mobility Overal bed mobility: Needs Assistance Bed Mobility: Supine to Sit;Sit to Supine     Supine to sit: Min assist Sit to supine: Min guard      Transfers Overall transfer level: Needs assistance   Transfers: Sit to/from [employer] Transfers Sit to Stand: Min assist Stand pivot transfers: Min assist                ADL either performed or assessed with clinical judgement   ADL Overall ADL's : Needs assistance/impaired Eating/Feeding: Minimal assistance;Sitting   Grooming: Minimal assistance;Sitting   Upper Body Bathing: Minimal assistance;Sitting   Lower Body Bathing: Moderate assistance;Sit to/from stand;Cueing for safety;Cueing for sequencing   Upper Body Dressing : Set up;Sitting   Lower Body Dressing: Moderate assistance;Sit to/from stand;Cueing for safety;Cueing for sequencing   Toilet Transfer: Minimal assistance;BSC;Stand-pivot   Toileting- Clothing Manipulation and Hygiene: Minimal assistance;Sit  to/from stand;Cueing for safety;Cueing for sequencing               Vision Patient Visual Report: No change from baseline              Pertinent Vitals/Pain Pain Assessment: No/denies pain     Hand Dominance     Extremity/Trunk Assessment Upper Extremity Assessment Upper Extremity Assessment: Generalized weakness           Communication Communication Communication: HOH   Cognition Arousal/Alertness: Awake/alert Behavior During Therapy: WFL for tasks assessed/performed Overall Cognitive Status: Within Functional Limits for tasks assessed                                                Home Living Family/patient expects to be discharged to:: Private residence Living Arrangements: Alone Available Help at Discharge: Personal care attendant (aides 9 AM-noon, 1-3, and 6-9pm) Type of Home: Other(Comment) (condo) Home Access: Level entry     Home Layout: One level     Bathroom Shower/Tub: Teacher, early years/pre: Standard     Home Equipment: Cane - single point;Walker - 4 wheels   Additional Comments: Pt bought her rollator day before admission and has not used it yet      Prior Functioning/Environment Level of Independence: Independent with assistive device(s)        Comments: Amb with cane, but has been feeling unsteady so she just bought a rollator, but has not used  OT Problem List: Decreased strength;Decreased activity tolerance;Decreased knowledge of use of DME or AE;Impaired balance (sitting and/or standing)      OT Treatment/Interventions: Self-care/ADL training;Patient/family education;DME and/or AE instruction    OT Goals(Current goals can be found in the care plan section) Acute Rehab OT Goals Patient Stated Goal: to go to rehab OT Goal Formulation: With patient Time For Goal Achievement: 08/26/16 Potential to Achieve Goals: Good  OT Frequency: Min 2X/week   Barriers to D/C:                AM-PAC PT "6 Clicks" Daily Activity     Outcome Measure Help from another person eating meals?: A Little Help from another person taking care of personal grooming?: A Little Help from another person toileting, which includes using toliet, bedpan, or urinal?: A Little Help from another person bathing (including washing, rinsing, drying)?: A Little Help from another person to put on and taking off regular upper body clothing?: A Little Help from another person to put on and taking off regular lower body clothing?: A Lot 6 Click Score: 17   End of Session Nurse Communication: Mobility status  Activity Tolerance: Patient tolerated treatment well Patient left: in bed;with call bell/phone within reach;with bed alarm set  OT Visit Diagnosis: Unsteadiness on feet (R26.81);Muscle weakness (generalized) (M62.81)                Time: 1287-8676 OT Time Calculation (min): 17 min Charges:  OT General Charges $OT Visit: 1 Procedure OT Evaluation $OT Eval Moderate Complexity: 1 Procedure G-Codes:     Kari Baars, [place]  Payton Mccallum D 08/12/2016, 10:44 AM

## 2016-08-12 NOTE — Progress Notes (Signed)
Discharge report called to RN Ronny Bacon at [facility]. Awaiting PTAR for transport. Eulas Post, RN

## 2016-08-12 NOTE — NC FL2 (Signed)
[place] LEVEL OF CARE SCREENING TOOL     IDENTIFICATION  Patient Name: [name] Birthdate: [birth year] Sex: female Admission Date (Current Location): 08/08/2016  [facility] and [place] Number:  Herbalist and Address:  [facility],  [place] [address] [place]      Provider Number: [id number]  Attending Physician Name and Address:  Domenic Polite, MD  Relative Name and Phone Number:       Current Level of Care: Hospital Recommended Level of Care: [place] Prior Approval Number:    Date Approved/Denied:   PASRR Number: [id number]  Discharge Plan: SNF    Current Diagnoses: Patient Active Problem List   Diagnosis Date Noted  . DJD (degenerative joint disease) 08/09/2016  . GI bleed 08/08/2016  . Abnormal echocardiogram 06/14/2016  . PVC's (premature ventricular contractions)   . Bacteremia 09/18/2014  . Fever   . Febrile illness 09/12/2014  . Diverticulitis 09/12/2014  . CKD (chronic kidney disease) stage 3, GFR 30-59 ml/min 09/12/2014  . HTN (hypertension) 09/12/2014  . Thrombocytopenia ([place]) 09/12/2014  . Abnormal cardiovascular stress test 01/13/2014  . Rectal bleed 08/10/2013  . Acute renal failure ([place]) 08/10/2013  . Essential hypertension, benign 01/08/2013  . DOE (dyspnea on exertion) 01/08/2013    Orientation RESPIRATION BLADDER Height & Weight     Self, Time, Situation, Place  Normal Incontinent Weight: 150 lb (68 kg) Height:  5\' 6"  (167.6 cm) (Pt. claims 5'6" but also claims to have shrunk since then)  BEHAVIORAL SYMPTOMS/MOOD NEUROLOGICAL BOWEL NUTRITION STATUS      Continent Diet (Soft)  AMBULATORY STATUS COMMUNICATION OF NEEDS Skin   Limited Assist Verbally Normal                       Personal Care Assistance Level of Assistance  Bathing, Feeding, Dressing Bathing Assistance: Limited assistance Feeding assistance: Independent Dressing Assistance: Limited assistance    Functional Limitations Info             [place]  PT (By licensed PT), OT (By licensed OT)     PT Frequency: 5x/week OT Frequency: 5x/week            Contractures      Additional Factors Info  Code Status, Allergies Code Status Info: Full Code Allergies Info: Ace Inhibitors,Streptomycin,Tramadol,Vesicare Solifenacin Succinate,Penicillins           Current Medications (08/12/2016):  This is the current hospital active medication list Current Facility-Administered Medications  Medication Dose Route Frequency Provider Last Rate Last Dose  . 0.9 %  sodium chloride infusion   Intravenous Continuous Domenic Polite, MD 10 mL/hr at 08/09/16 1944    . acetaminophen (TYLENOL) tablet 650 mg  650 mg Oral Q6H PRN Arrien, Jimmy Picket, MD      . atenolol (TENORMIN) tablet 12.5 mg  12.5 mg Oral Daily Domenic Polite, MD   12.5 mg at 08/12/16 1126  . hydrALAZINE (APRESOLINE) injection 5 mg  5 mg Intravenous Q4H PRN Opyd, Ilene Qua, MD      . HYDROcodone-acetaminophen (NORCO/VICODIN) 5-325 MG per tablet 1 tablet  1 tablet Oral Q6H PRN Domenic Polite, MD      . isosorbide mononitrate (IMDUR) 24 hr tablet 30 mg  30 mg Oral Daily Domenic Polite, MD   30 mg at 08/12/16 1125  . lidocaine (LIDODERM) 5 % 1 patch  1 patch Transdermal Q24H Arrien, Jimmy Picket, MD   1 patch at  08/12/16 1128  . ondansetron (ZOFRAN) tablet 4 mg  4 mg Oral Q6H PRN Arrien, Jimmy Picket, MD       Or  . ondansetron [facility]) injection 4 mg  4 mg Intravenous Q6H PRN Arrien, Jimmy Picket, MD      . pantoprazole (PROTONIX) EC tablet 40 mg  40 mg Oral BID Domenic Polite, MD   40 mg at 08/12/16 1125  . polyvinyl alcohol (LIQUIFILM TEARS) 1.4 % ophthalmic solution 1 drop  1 drop Both Eyes BID Berton Mount, RPH   1 drop at 08/11/16 2003  . prenatal multivitamin tablet 1 tablet  1 tablet Oral Q1200 Domenic Polite, MD      . sodium chloride flush (NS) 0.9 % injection 3 mL  3 mL  Intravenous Q12H Arrien, Jimmy Picket, MD   3 mL at 08/11/16 2004  . venlafaxine XR (EFFEXOR-XR) 24 hr capsule 75 mg  75 mg Oral Daily Arrien, Jimmy Picket, MD   75 mg at 08/12/16 1125     Discharge Medications: Please see discharge summary for a list of discharge medications.  Relevant Imaging Results:  Relevant Lab Results:   Additional Information SSN [id number]  Burnis Medin, LCSW

## 2016-08-12 NOTE — Discharge Summary (Signed)
Physician Discharge Summary  [name] MLY:[id number] DOB: 1916/05/11 DOA: 08/08/2016  PCP: Josetta Huddle, MD  Admit date: 08/08/2016 Discharge date: 08/12/2016  Time spent: 35 minutes  Recommendations for Outpatient Follow-up:  1. PCP in 1 week, please check CBC in 1 week, please change Protonix to Once daily after 1 month, consider resuming lasix/KCl at follow up as BP/volume status tolerates 2. SNF for Rehab  Discharge Diagnoses:    Upper Gi Bleed   Mild Acute blood loss anemia   Leukocytosis   Essential hypertension, benign   CKD (chronic kidney disease) stage 3, GFR 30-59 ml/min   GI bleed   DJD (degenerative joint disease)   Low back pain  Discharge Condition: stable Diet recommendation: heart healthy  Filed Weights   08/08/16 1815 08/10/16 1045  Weight: 63.5 kg (139 lb 15.9 oz) 68 kg (150 lb)    History of present illness:  Morgan M Meredithis a 83 y.o.femalewith medical history significant of ambulatory dysfunction, congestive heart failure, chronic kidney disease stage III, coronary artery disease, DJD who presented with the chief complaint of back pain. For the last 2 weeks patient has been experiencing worsening back pain, initially intermittent but over last 7 days more persistent, she has been placed on prednisone and ibuprofen, she recently received a in the injection of steroids with no significant improvement of her back pain. The pain is at the lower back, moderate to severe intensity, sharp in nature, mildly improved with NSAIDs and steroids, worse with ambulation and movement. It has been associated with dark stools, lightheadedness, dizziness and generalized weakness she presented 7/12 due to worsening symptoms, in ED Found to be  hypotensive and anemia  Hospital Course:  1. Upper GI bleed with acute blood loss anemia.  -likely NSAID/prednisone related PUD or gastritis, she was treated using this recently for acute low back pain/DJD -[facility] was  consulted, due to advanced age (67) and debility,we managed her conservatively only, Endoscopy not pursued this was discussed with pt and family and they are in agreement -she has remained stable and did not have any further bleeding and Hb remained stable in the 10-11range, started on PPI this admission, changed to PPI BID PO for 44month and then once daily -advanced diet, Pt eval completed, SNF for rehab recommended  2. Acute on chronic back pain.  -due to advanced DJD, noted on Xrays and CT abd pelvis done this admission -prior h/o thoraco-lumbar fusion -supportive care, no NSAIDs, tylenol, lidocaine patch, vicodin PRN used  -PT eval completed SNF recommended  3. Leukocytosis -reactive and some spurious lab, yesterday WBC bumped to 21K from 14 range and today back to 14, no fevers, remains clinically stable and non toxic, UA and CXR unremarkable, abd exam has been benign too  4. Hypertension.  -holding furosemide and KCL at discharge due to overall soft BPs and even volume status  -resumed atenolol at lower dose and imdur -consider restarting lasix/KCl at FU depending on BP/volume status   4. Depression. : Continue venlafaxine  Consultations:  [facility]  Discharge Exam: Vitals:   08/11/16 2027 08/12/16 0532  BP: (!) 122/54 (!) 113/54  Pulse: 62 65  Resp: 18 16  Temp: 98.5 F (36.9 C) 98.8 F (37.1 C)    General: AAOx3 Cardiovascular: S1S2/RRR Respiratory: CTAB  Discharge Instructions   Discharge Instructions    Diet - low sodium heart healthy    Complete by:  As directed    Increase activity slowly    Complete by:  As directed      Current Discharge Medication List    START taking these medications   Details  HYDROcodone-acetaminophen (NORCO/VICODIN) 5-325 MG tablet Take 1 tablet by mouth every 6 (six) hours as needed for severe pain. Qty: 15 tablet, Refills: 0    pantoprazole (PROTONIX) 40 MG tablet Take 1 tablet (40 mg total) by mouth 2 (two) times  daily. For 1 month and then once daily      CONTINUE these medications which have CHANGED   Details  acetaminophen (TYLENOL) 500 MG tablet Take 1 tablet (500 mg total) by mouth every 6 (six) hours as needed for moderate pain. Qty: 30 tablet, Refills: 0    loperamide (IMODIUM) 2 MG capsule Take 1 capsule (2 mg total) by mouth as needed for diarrhea or loose stools. Qty: 30 capsule, Refills: 0      CONTINUE these medications which have NOT CHANGED   Details  isosorbide mononitrate (IMDUR) 30 MG 24 hr tablet Take 1 tablet (30 mg total) by mouth daily. Qty: 90 tablet, Refills: 3    Prenatal Vit-Fe Fumarate-FA (PRENATAL PO) Take 1 tablet by mouth daily.    venlafaxine XR (EFFEXOR-XR) 75 MG 24 hr capsule Take 75 mg by mouth daily. Refills: 0    atenolol (TENORMIN) 25 MG tablet Take by mouth daily.    clotrimazole (LOTRIMIN) 1 % cream Apply 1 application topically as needed (foot -- infection/rash).     estrogens, conjugated, (PREMARIN) 0.3 MG tablet Take 0.3 mg by mouth every morning.     meclizine (ANTIVERT) 25 MG tablet Take 25 mg by mouth 2 (two) times daily as needed for dizziness or nausea.     Polyvinyl Alcohol-Povidone (REFRESH OP) Apply 1 drop to eye 2 (two) times daily.      STOP taking these medications     furosemide (LASIX) 40 MG tablet      potassium chloride SA (K-DUR,KLOR-CON) 20 MEQ tablet      predniSONE (STERAPRED UNI-PAK 21 TAB) 10 MG (21) TBPK tablet      aspirin EC 81 MG tablet        Allergies  Allergen Reactions  . Ace Inhibitors Hives and Itching  . Streptomycin Other (See Comments) and Itching    Pruritus Pruritus   . Tramadol Other (See Comments)    unknown  . Vesicare [Solifenacin Succinate] Itching  . Penicillins Rash    Pruritic rash Has patient had a PCN reaction causing immediate rash, facial/tongue/throat swelling, SOB or lightheadedness with hypotension: unknown Has patient had a PCN reaction causing severe rash involving mucus  membranes or skin necrosis: unknown Has patient had a PCN reaction that required hospitalization:unknown Has patient had a PCN reaction occurring within the last 10 years: yes If all of the above answers are "NO", then may proceed with Cephalosporin use.    Follow-up Information    Josetta Huddle, MD. Schedule an appointment as soon as possible for a visit in 1 week(s).   Specialty:  Internal Medicine Contact information: [address] [place] [address] [phone]            The results of significant diagnostics from this hospitalization (including imaging, microbiology, ancillary and laboratory) are listed below for reference.    Significant Diagnostic Studies: Dg Ribs Unilateral W/chest Right  Result Date: 08/04/2016 CLINICAL DATA:  Initial evaluation for right flank pain for 1 month radiating to back. No injury. EXAM: RIGHT RIBS AND CHEST - 3+ VIEW COMPARISON:  Prior radiograph  from 05/28/2016. FINDINGS: Cardiac and mediastinal silhouettes stable in size and contour, and remain within normal limits. Lungs normally inflated. No focal infiltrates. No pulmonary edema or pleural effusion. No pneumothorax. Metallic BB marker overlies the lower right ribs at site of pain. No acute displaced rib fracture identified. No other acute osseous abnormality. IMPRESSION: 1. No acute displaced right-sided rib fracture identified. 2. No other active cardiopulmonary disease. Electronically Signed   By: Jeannine Boga M.D.   On: 08/04/2016 22:35   Dg Thoracic Spine 2 View  Result Date: 08/04/2016 CLINICAL DATA:  Initial evaluation for acute right flank pain for 1 month radiating to back. EXAM: THORACIC SPINE 2 VIEWS COMPARISON:  Prior radiograph from 09/12/2014. FINDINGS: Focal dextroscoliosis of the upper lumbar spine with apex at L2 with associated severe degenerative spondylolysis, grossly similar to previous. Vertebral bodies otherwise normally aligned with preservation of  the normal thoracic kyphosis. No acute fracture identified. Multilevel degenerative endplate spurring seen throughout the thoracic spine. Degenerative changes noted within the partially visualized cervical spine as well. No acute soft tissue abnormality. IMPRESSION: 1. No radiographic evidence for acute abnormality within the thoracic spine. 2. Dextroscoliosis of the upper lumbar spine with associated severe degenerative spondylolysis, grossly similar to previous. Electronically Signed   By: Jeannine Boga M.D.   On: 08/04/2016 22:32   Dg Abdomen Acute W/chest  Result Date: 08/08/2016 CLINICAL DATA:  Mid abdominal pain, nausea EXAM: DG ABDOMEN ACUTE W/ 1V CHEST COMPARISON:  08/04/2016 FINDINGS: The bowel gas pattern is normal. There is no evidence of free intraperitoneal air. No suspicious radio-opaque calculi or other significant radiographic abnormality is seen. Heart size and mediastinal contours are within normal limits. Both lungs are clear. Scoliosis and degenerative changes in the lumbar spine. IMPRESSION: No evidence of bowel obstruction or free air. No active cardiopulmonary disease. Electronically Signed   By: Rolm Baptise M.D.   On: 08/08/2016 12:14   Ct Angio Abd/pel W And/or Wo Contrast  Result Date: 08/08/2016 CLINICAL DATA:  dark tarry stools per HH. On antibiotics for UTI currently. BP possibly 60 systolic by EMS upon arrival. Alert and oriented.Upper abd pain since Sunday with n/v and blood in stools;lactic acidosis; eval for ishemic bowel EXAM: CTA ABDOMEN AND PELVIS WITH CONTRAST TECHNIQUE: Multidetector CT imaging of the abdomen and pelvis was performed using the standard protocol during bolus administration of intravenous contrast. Multiplanar reconstructed images and MIPs were obtained and reviewed to evaluate the vascular anatomy. CONTRAST:  80 mL Isovue 370 IV COMPARISON:  09/12/2014 FINDINGS: VASCULAR Aorta: Minimal plaque. Moderately tortuous. No dissection, aneurysm, or  stenosis. Celiac: Mild proximal narrowing at the level of the median arcuate ligament of the diaphragm, patent distally with unremarkable branching. SMA: Patent without evidence of aneurysm, dissection, vasculitis or significant stenosis. Visualized distal branching unremarkable. Renals: Both renal arteries are patent without evidence of aneurysm, dissection, vasculitis, fibromuscular dysplasia or significant stenosis. IMA: Patent without evidence of aneurysm, dissection, vasculitis or significant stenosis. Inflow: Patent without evidence of aneurysm, dissection, vasculitis or significant stenosis. Mild tortuosity. Proximal Outflow: Bilateral common femoral and visualized portions of the superficial and profunda femoral arteries are patent without evidence of aneurysm, dissection, vasculitis or significant stenosis. Veins: Patent hepatic veins, portal vein, SMV, splenic vein, bilateral renal veins, IVC, and iliac venous systems. Review of the MIP images confirms the above findings. NON-VASCULAR Lower chest: Linear scarring or subsegmental atelectasis medially at both lung bases. No pleural or pericardial effusion. Hepatobiliary: No focal liver abnormality is seen. No gallstones, gallbladder  wall thickening, or biliary dilatation. Stable central biliary ductal ectasia. Pancreas: Unremarkable. No pancreatic ductal dilatation or surrounding inflammatory changes. Spleen: Normal in size without focal abnormality. Adrenals/Urinary Tract: Adrenal glands are unremarkable. Kidneys are normal, without renal calculi, focal lesion, or hydronephrosis. Bladder is unremarkable. Stomach/Bowel: Moderate hiatal hernia. The stomach is incompletely distended limiting mucosal assessment. Small bowel is nondilated. Appendix not discretely identified. Scattered colonic diverticula without adjacent inflammatory/ edematous change. Some wall thickening in the mid sigmoid colon, nonspecific, with some small regional mesenteric nodes.  Lymphatic: Subcentimeter left para-aortic and aortocaval lymph nodes. No mesenteric or pelvic adenopathy. Reproductive: Status post hysterectomy. No adnexal masses. Other: No ascites.  No free air. Musculoskeletal: Chronic Interbody fusion T12-L2, and Spondylitic changes throughout the visualized lower thoracic and lumbar spine. No fracture or worrisome bone lesion. IMPRESSION: VASCULAR 1. No significant proximal mesenteric arterial occlusive disease or other significant vascular findings. NON-VASCULAR 1. Scattered colonic diverticulosis without CT evidence of diverticulitis, abscess, or active extravasation. Correlate with colonoscopic findings. 2. Moderate hiatal hernia.  Correlate with endoscopic findings. Electronically Signed   By: Lucrezia Europe M.D.   On: 08/08/2016 14:21    Microbiology: Recent Results (from the past 240 hour(s))  Blood Culture (routine x 2)     Status: None (Preliminary result)   Collection Time: 08/08/16 11:45 AM  Result Value Ref Range Status   Specimen Description BLOOD BLOOD RIGHT HAND  Final   Special Requests IN PEDIATRIC BOTTLE Blood Culture adequate volume  Final   Culture   Final    NO GROWTH 4 DAYS Performed at [facility], [place] [address] [place], [place] [address]    Report Status PENDING  Incomplete  Blood Culture (routine x 2)     Status: None (Preliminary result)   Collection Time: 08/08/16 11:46 AM  Result Value Ref Range Status   Specimen Description BLOOD BLOOD LEFT HAND  Final   Special Requests IN PEDIATRIC BOTTLE Blood Culture adequate volume  Final   Culture   Final    NO GROWTH 4 DAYS Performed at [facility], [place] [address] [place], [place] [address]    Report Status PENDING  Incomplete  Urine culture     Status: Abnormal   Collection Time: 08/08/16  1:10 PM  Result Value Ref Range Status   Specimen Description URINE, CLEAN CATCH  Final   Special Requests NONE  Final   Culture MULTIPLE ORGANISMS PRESENT, NONE PREDOMINANT (A)   Final   Report Status 08/09/2016 FINAL  Final  MRSA PCR Screening     Status: None   Collection Time: 08/08/16  5:32 PM  Result Value Ref Range Status   MRSA by PCR NEGATIVE NEGATIVE Final    Comment:        The GeneXpert MRSA Assay (FDA approved for NASAL specimens only), is one component of a comprehensive MRSA colonization surveillance program. It is not intended to diagnose MRSA infection nor to guide or monitor treatment for MRSA infections.      Labs: Basic Metabolic Panel:  Recent Labs Lab 08/08/16 1047 08/09/16 0816 08/10/16 0357 08/11/16 0436 08/12/16 0415  NA 144 145 142 140 138  K 3.9 3.3* 3.6 3.3* 3.2*  CL 113* 117* 114* 111 108  CO2 17* 21* 21* 21* 21*  GLUCOSE 241* 96 101* 122* 114*  BUN 67* 36* 22* 15 11  CREATININE 1.13* 0.92 0.82 0.79 0.77  CALCIUM 9.1 8.4* 8.6* 8.4* 8.2*   Liver Function Tests:  Recent Labs  Lab 08/08/16 1047  AST 24  ALT 13*  ALKPHOS 53  BILITOT 0.3  PROT 5.8*  ALBUMIN 2.8*   No results for input(s): LIPASE, AMYLASE in the last 168 hours. No results for input(s): AMMONIA in the last 168 hours. CBC:  Recent Labs Lab 08/08/16 1047  08/09/16 0704 08/09/16 1727 08/10/16 0357 08/11/16 0436 08/12/16 0415  WBC 30.0*  --   --  17.0* 15.2* 21.0* 14.0*  HGB 7.9*  < > 10.4* 11.5* 11.1* 10.7* 10.7*  HCT 23.8*  < > 30.4* 34.4* 33.3* 32.5* 31.9*  MCV 93.7  --   --  86.6 86.9 87.8 88.1  PLT 235  --   --  186 189 198 189  < > = values in this interval not displayed. Cardiac Enzymes: No results for input(s): CKTOTAL, CKMB, CKMBINDEX, TROPONINI in the last 168 hours. BNP: BNP (last 3 results)  Recent Labs  08/04/16 2221  BNP 62.8    ProBNP (last 3 results)  Recent Labs  05/28/16 1153  PROBNP 411    CBG: No results for input(s): GLUCAP in the last 168 hours.     SignedDomenic Polite MD.  [facility] 08/12/2016, 2:13 PM

## 2016-08-12 NOTE — Evaluation (Signed)
Occupational Therapy Evaluation Patient Details Name: [name] MRN: [id number] DOB: [birth year] Today's Date: 08/12/2016    History of Present Illness [name] is a 83 y.o. female with medical history significant of ambulatory dysfunction, congestive heart failure, chronic kidney disease stage III, coronary artery disease, DJD who presented with the chief complaint of back pain.  Pt found to have GI bleed with anemia.   Clinical Impression   Pt admitted with GI bleed. Pt currently with functional limitations due to the deficits listed below (see OT Problem List).  Pt will benefit from skilled OT to increase their safety and independence with ADL and functional mobility for ADL to facilitate discharge to venue listed below.      Follow Up Recommendations  SNF    Equipment Recommendations  None recommended by OT       Precautions / Restrictions Restrictions Weight Bearing Restrictions: No      Mobility Bed Mobility Overal bed mobility: Needs Assistance Bed Mobility: Supine to Sit;Sit to Supine     Supine to sit: Min assist Sit to supine: Min guard      Transfers Overall transfer level: Needs assistance   Transfers: Sit to/from [employer] Transfers Sit to Stand: Min assist Stand pivot transfers: Min assist                ADL either performed or assessed with clinical judgement   ADL Overall ADL's : Needs assistance/impaired Eating/Feeding: Minimal assistance;Sitting   Grooming: Minimal assistance;Sitting   Upper Body Bathing: Minimal assistance;Sitting   Lower Body Bathing: Moderate assistance;Sit to/from stand;Cueing for safety;Cueing for sequencing   Upper Body Dressing : Set up;Sitting   Lower Body Dressing: Moderate assistance;Sit to/from stand;Cueing for safety;Cueing for sequencing   Toilet Transfer: Minimal assistance;BSC;Stand-pivot   Toileting- Clothing Manipulation and Hygiene: Minimal assistance;Sit to/from  stand;Cueing for safety;Cueing for sequencing               Vision Patient Visual Report: No change from baseline              Pertinent Vitals/Pain Pain Assessment: No/denies pain     Hand Dominance     Extremity/Trunk Assessment Upper Extremity Assessment Upper Extremity Assessment: Generalized weakness           Communication Communication Communication: HOH   Cognition Arousal/Alertness: Awake/alert Behavior During Therapy: WFL for tasks assessed/performed Overall Cognitive Status: Within Functional Limits for tasks assessed                                                Home Living Family/patient expects to be discharged to:: Private residence Living Arrangements: Alone Available Help at Discharge: Personal care attendant (aides 9 AM-noon, 1-3, and 6-9pm) Type of Home: Other(Comment) (condo) Home Access: Level entry     Home Layout: One level     Bathroom Shower/Tub: Teacher, early years/pre: Standard     Home Equipment: Cane - single point;Walker - 4 wheels   Additional Comments: Pt bought her rollator day before admission and has not used it yet      Prior Functioning/Environment Level of Independence: Independent with assistive device(s)        Comments: Amb with cane, but has been feeling unsteady so she just bought a rollator, but has not used        OT Problem List: Decreased  strength;Decreased activity tolerance;Decreased knowledge of use of DME or AE;Impaired balance (sitting and/or standing)      OT Treatment/Interventions: Self-care/ADL training;Patient/family education;DME and/or AE instruction    OT Goals(Current goals can be found in the care plan section) Acute Rehab OT Goals Patient Stated Goal: to go to rehab OT Goal Formulation: With patient Time For Goal Achievement: 08/26/16 Potential to Achieve Goals: Good ADL Goals Pt Will Perform Lower Body Bathing: sit to/from stand;with  supervision Pt Will Perform Lower Body Dressing: with supervision;sit to/from stand Pt Will Transfer to Toilet: with supervision;regular height toilet;ambulating Pt Will Perform Toileting - Clothing Manipulation and hygiene: with supervision;sit to/from stand  OT Frequency: Min 2X/week   Barriers to D/C:               AM-PAC PT "6 Clicks" Daily Activity     Outcome Measure Help from another person eating meals?: A Little Help from another person taking care of personal grooming?: A Little Help from another person toileting, which includes using toliet, bedpan, or urinal?: A Little Help from another person bathing (including washing, rinsing, drying)?: A Little Help from another person to put on and taking off regular upper body clothing?: A Little Help from another person to put on and taking off regular lower body clothing?: A Lot 6 Click Score: 17   End of Session Nurse Communication: Mobility status  Activity Tolerance: Patient tolerated treatment well Patient left: in bed;with call bell/phone within reach;with bed alarm set  OT Visit Diagnosis: Unsteadiness on feet (R26.81);Muscle weakness (generalized) (M62.81)                Time: 0160-1093 OT Time Calculation (min): 17 min Charges:  OT General Charges $OT Visit: 1 Procedure OT Evaluation $OT Eval Moderate Complexity: 1 Procedure G-Codes:     Kari Baars, [place]  Payton Mccallum D 08/12/2016, 10:52 AM

## 2016-08-13 LAB — CULTURE, BLOOD (ROUTINE X 2)
CULTURE: NO GROWTH
CULTURE: NO GROWTH
SPECIAL REQUESTS: ADEQUATE
Special Requests: ADEQUATE

## 2016-08-13 LAB — URINE CULTURE

## 2016-08-18 DIAGNOSIS — I10 Essential (primary) hypertension: Secondary | ICD-10-CM | POA: Diagnosis not present

## 2016-08-18 DIAGNOSIS — K922 Gastrointestinal hemorrhage, unspecified: Secondary | ICD-10-CM | POA: Diagnosis not present

## 2016-08-18 DIAGNOSIS — E440 Moderate protein-calorie malnutrition: Secondary | ICD-10-CM | POA: Diagnosis not present

## 2016-08-18 DIAGNOSIS — M545 Low back pain: Secondary | ICD-10-CM | POA: Diagnosis not present

## 2016-08-18 DIAGNOSIS — M6281 Muscle weakness (generalized): Secondary | ICD-10-CM | POA: Diagnosis not present

## 2016-08-18 DIAGNOSIS — M79602 Pain in left arm: Secondary | ICD-10-CM | POA: Diagnosis not present

## 2016-08-20 DIAGNOSIS — L988 Other specified disorders of the skin and subcutaneous tissue: Secondary | ICD-10-CM | POA: Diagnosis not present

## 2016-08-29 DIAGNOSIS — K922 Gastrointestinal hemorrhage, unspecified: Secondary | ICD-10-CM | POA: Diagnosis not present

## 2016-08-30 DIAGNOSIS — M6281 Muscle weakness (generalized): Secondary | ICD-10-CM | POA: Diagnosis not present

## 2016-08-30 DIAGNOSIS — N183 Chronic kidney disease, stage 3 (moderate): Secondary | ICD-10-CM | POA: Diagnosis not present

## 2016-08-30 DIAGNOSIS — F329 Major depressive disorder, single episode, unspecified: Secondary | ICD-10-CM | POA: Diagnosis not present

## 2016-08-30 DIAGNOSIS — I129 Hypertensive chronic kidney disease with stage 1 through stage 4 chronic kidney disease, or unspecified chronic kidney disease: Secondary | ICD-10-CM | POA: Diagnosis not present

## 2016-09-03 DIAGNOSIS — I5032 Chronic diastolic (congestive) heart failure: Secondary | ICD-10-CM | POA: Diagnosis not present

## 2016-09-03 DIAGNOSIS — D649 Anemia, unspecified: Secondary | ICD-10-CM | POA: Diagnosis not present

## 2016-09-03 DIAGNOSIS — I10 Essential (primary) hypertension: Secondary | ICD-10-CM | POA: Diagnosis not present

## 2016-09-03 DIAGNOSIS — K922 Gastrointestinal hemorrhage, unspecified: Secondary | ICD-10-CM | POA: Diagnosis not present

## 2016-09-04 DIAGNOSIS — N183 Chronic kidney disease, stage 3 (moderate): Secondary | ICD-10-CM | POA: Diagnosis not present

## 2016-09-04 DIAGNOSIS — I129 Hypertensive chronic kidney disease with stage 1 through stage 4 chronic kidney disease, or unspecified chronic kidney disease: Secondary | ICD-10-CM | POA: Diagnosis not present

## 2016-09-04 DIAGNOSIS — M6281 Muscle weakness (generalized): Secondary | ICD-10-CM | POA: Diagnosis not present

## 2016-09-04 DIAGNOSIS — F329 Major depressive disorder, single episode, unspecified: Secondary | ICD-10-CM | POA: Diagnosis not present

## 2016-09-05 ENCOUNTER — Emergency Department (HOSPITAL_COMMUNITY): Payer: Medicare Other

## 2016-09-05 ENCOUNTER — Encounter (HOSPITAL_COMMUNITY): Payer: Self-pay | Admitting: Emergency Medicine

## 2016-09-05 ENCOUNTER — Emergency Department (HOSPITAL_COMMUNITY)
Admission: EM | Admit: 2016-09-05 | Discharge: 2016-09-05 | Disposition: A | Discharge status: Home / Self Care | Payer: Medicare Other | Attending: Emergency Medicine | Admitting: Emergency Medicine

## 2016-09-05 DIAGNOSIS — S199XXA Unspecified injury of neck, initial encounter: Secondary | ICD-10-CM | POA: Diagnosis not present

## 2016-09-05 DIAGNOSIS — W19XXXA Unspecified fall, initial encounter: Secondary | ICD-10-CM

## 2016-09-05 DIAGNOSIS — T148XXA Other injury of unspecified body region, initial encounter: Secondary | ICD-10-CM | POA: Diagnosis not present

## 2016-09-05 DIAGNOSIS — W1830XA Fall on same level, unspecified, initial encounter: Secondary | ICD-10-CM | POA: Diagnosis not present

## 2016-09-05 DIAGNOSIS — M545 Low back pain: Secondary | ICD-10-CM | POA: Diagnosis not present

## 2016-09-05 DIAGNOSIS — Y999 Unspecified external cause status: Secondary | ICD-10-CM | POA: Diagnosis not present

## 2016-09-05 DIAGNOSIS — M25552 Pain in left hip: Secondary | ICD-10-CM | POA: Diagnosis not present

## 2016-09-05 DIAGNOSIS — M542 Cervicalgia: Secondary | ICD-10-CM | POA: Diagnosis not present

## 2016-09-05 DIAGNOSIS — Y92002 Bathroom of unspecified non-institutional (private) residence single-family (private) house as the place of occurrence of the external cause: Secondary | ICD-10-CM | POA: Diagnosis not present

## 2016-09-05 DIAGNOSIS — M546 Pain in thoracic spine: Secondary | ICD-10-CM | POA: Diagnosis not present

## 2016-09-05 DIAGNOSIS — Z043 Encounter for examination and observation following other accident: Secondary | ICD-10-CM | POA: Diagnosis present

## 2016-09-05 DIAGNOSIS — M25579 Pain in unspecified ankle and joints of unspecified foot: Secondary | ICD-10-CM | POA: Diagnosis not present

## 2016-09-05 DIAGNOSIS — M25551 Pain in right hip: Secondary | ICD-10-CM | POA: Insufficient documentation

## 2016-09-05 DIAGNOSIS — S79911A Unspecified injury of right hip, initial encounter: Secondary | ICD-10-CM | POA: Diagnosis not present

## 2016-09-05 DIAGNOSIS — S79912A Unspecified injury of left hip, initial encounter: Secondary | ICD-10-CM | POA: Diagnosis not present

## 2016-09-05 DIAGNOSIS — R0781 Pleurodynia: Secondary | ICD-10-CM | POA: Diagnosis not present

## 2016-09-05 DIAGNOSIS — R0789 Other chest pain: Secondary | ICD-10-CM | POA: Diagnosis not present

## 2016-09-05 DIAGNOSIS — S0990XA Unspecified injury of head, initial encounter: Secondary | ICD-10-CM | POA: Diagnosis not present

## 2016-09-05 DIAGNOSIS — S299XXA Unspecified injury of thorax, initial encounter: Secondary | ICD-10-CM | POA: Diagnosis not present

## 2016-09-05 DIAGNOSIS — M25562 Pain in left knee: Secondary | ICD-10-CM | POA: Insufficient documentation

## 2016-09-05 DIAGNOSIS — Y93E8 Activity, other personal hygiene: Secondary | ICD-10-CM | POA: Diagnosis not present

## 2016-09-05 DIAGNOSIS — S8991XA Unspecified injury of right lower leg, initial encounter: Secondary | ICD-10-CM | POA: Diagnosis not present

## 2016-09-05 DIAGNOSIS — M25561 Pain in right knee: Secondary | ICD-10-CM | POA: Diagnosis not present

## 2016-09-05 LAB — COMPREHENSIVE METABOLIC PANEL WITH GFR
ALT: 15 U/L (ref 14–54)
AST: 37 U/L (ref 15–41)
Albumin: 3.8 g/dL (ref 3.5–5.0)
Alkaline Phosphatase: 61 U/L (ref 38–126)
Anion gap: 9 (ref 5–15)
BUN: 11 mg/dL (ref 6–20)
CO2: 24 mmol/L (ref 22–32)
Calcium: 9.5 mg/dL (ref 8.9–10.3)
Chloride: 108 mmol/L (ref 101–111)
Creatinine, Ser: 0.94 mg/dL (ref 0.44–1.00)
GFR calc Af Amer: 57 mL/min — ABNORMAL LOW
GFR calc non Af Amer: 49 mL/min — ABNORMAL LOW
Glucose, Bld: 95 mg/dL (ref 65–99)
Potassium: 4.3 mmol/L (ref 3.5–5.1)
Sodium: 141 mmol/L (ref 135–145)
Total Bilirubin: 0.8 mg/dL (ref 0.3–1.2)
Total Protein: 7.6 g/dL (ref 6.5–8.1)

## 2016-09-05 LAB — CBC WITH DIFFERENTIAL/PLATELET
Basophils Absolute: 0 K/uL (ref 0.0–0.1)
Basophils Relative: 0 %
Eosinophils Absolute: 0 K/uL (ref 0.0–0.7)
Eosinophils Relative: 0 %
HCT: 41.6 % (ref 36.0–46.0)
Hemoglobin: 13.3 g/dL (ref 12.0–15.0)
Lymphocytes Relative: 30 %
Lymphs Abs: 2.9 K/uL (ref 0.7–4.0)
MCH: 29.7 pg (ref 26.0–34.0)
MCHC: 32 g/dL (ref 30.0–36.0)
MCV: 92.9 fL (ref 78.0–100.0)
Monocytes Absolute: 1.1 K/uL — ABNORMAL HIGH (ref 0.1–1.0)
Monocytes Relative: 11 %
Neutro Abs: 5.7 K/uL (ref 1.7–7.7)
Neutrophils Relative %: 59 %
Platelets: UNDETERMINED K/uL (ref 150–400)
RBC: 4.48 MIL/uL (ref 3.87–5.11)
RDW: 17.9 % — ABNORMAL HIGH (ref 11.5–15.5)
WBC: 9.7 K/uL (ref 4.0–10.5)

## 2016-09-05 MED ORDER — ACETAMINOPHEN 325 MG PO TABS
650.0000 mg | ORAL_TABLET | Freq: Once | ORAL | Status: AC
  Administered 2016-09-05: 650 mg via ORAL
  Filled 2016-09-05: qty 2

## 2016-09-05 NOTE — ED Notes (Signed)
Delay on EKG. Pt in x-ray 

## 2016-09-05 NOTE — ED Notes (Signed)
Patient ambulated in the hallway with walker. 

## 2016-09-05 NOTE — ED Provider Notes (Signed)
[facility] DEPT Provider Note   CSN: [id number] Arrival date & time: 09/05/16  3664     History   Chief Complaint Chief Complaint  Patient presents with  . Fall    HPI [name] is a 83 y.o. female past echo history of DJD of knee and lumbar spine, CAD, CHF, CAD stage III, recent upper GI bleed who presented to the ED today after a fall. Patient states she woke up around 3 AM to use the bathroom. While trying to get up off the toilet she slipped and fell. Patient is unsure of how she fell or if she hit her head but she thinks she may have passed out. Patient states when she came to she was lying on the floor but does not recall which positioning she attempted to scoot to the bed but was unable to get up or make it there. Her family member found her on the floor lying on her right side. Patient is complaining of neck pain mid/lower back pain, right rib pain, bilateral hip pain and left knee pain. Patient states that her family member was able to get her up off the floor and she was able to stand. She does family at baseline with a walker. Denies any bowel or bladder incontinence, numbness or tingling in her lower extremities. She denies any change in her vision or vomiting. She was previously taking aspirin but was taken off of this one week ago. She is not any other blood thinners.  HPI  Past Medical History:  Diagnosis Date  . Allergic rhinitis   . CAD (coronary artery disease)   . CHF (congestive heart failure) ([place])   . CKD (chronic kidney disease) stage 3, GFR 30-59 ml/min 09/12/2014  . Diverticular disease   . Diverticulosis   . DJD (degenerative joint disease) of knee   . DJD (degenerative joint disease) of lumbar spine    scoliosis  . Eczema   . Esophageal erosions    from nsaid   . GI bleed   . Hormone replacement therapy (postmenopausal)   . Hyperglycemia   . Hyperlipidemia   . Hypertension   . Pneumonia    july 2009  . Pulmonary hypertension ([place])    a.  2D echo 03/2008 showed normal biventricular thickness, chamber size and systolic function, EF 40-34%, mild mitral regurgitation, aortic sclerosis, moderate pulmonary HTN, pulmonic regurgitation.  Marland Kitchen PVC's (premature ventricular contractions)   . Reactive depression (situational)   . Sciatica    right lower extremity  . Shingles    right thoracic 2008  . Vertigo     Patient Active Problem List   Diagnosis Date Noted  . DJD (degenerative joint disease) 08/09/2016  . GI bleed 08/08/2016  . Abnormal echocardiogram 06/14/2016  . PVC's (premature ventricular contractions)   . Bacteremia 09/18/2014  . Fever   . Febrile illness 09/12/2014  . Diverticulitis 09/12/2014  . CKD (chronic kidney disease) stage 3, GFR 30-59 ml/min 09/12/2014  . HTN (hypertension) 09/12/2014  . Thrombocytopenia ([place]) 09/12/2014  . Abnormal cardiovascular stress test 01/13/2014  . Rectal bleed 08/10/2013  . Acute renal failure ([place]) 08/10/2013  . Essential hypertension, benign 01/08/2013  . DOE (dyspnea on exertion) 01/08/2013    Past Surgical History:  Procedure Laterality Date  . APPENDECTOMY    . BACK SURGERY    . BREAST LUMPECTOMY    . FLEXIBLE SIGMOIDOSCOPY Left 08/13/2013   Procedure: FLEXIBLE SIGMOIDOSCOPY;  Surgeon: Arta Silence, MD;  Location: [facility] END[facility];  Service:  Endoscopy;  Laterality: Left;  . TOTAL ABDOMINAL HYSTERECTOMY      OB History    No data available       Home Medications    Prior to Admission medications   Medication Sig Start Date End Date Taking? Authorizing Provider  acetaminophen (TYLENOL) 500 MG tablet Take 1 tablet (500 mg total) by mouth every 6 (six) hours as needed for moderate pain. 08/12/16   Domenic Polite, MD  atenolol (TENORMIN) 25 MG tablet Take by mouth daily.    [provider]  clotrimazole (LOTRIMIN) 1 % cream Apply 1 application topically as needed (foot -- infection/rash).     [provider]  estrogens, conjugated, (PREMARIN) 0.3 MG  tablet Take 0.3 mg by mouth every morning.     [provider]  HYDROcodone-acetaminophen (NORCO/VICODIN) 5-325 MG tablet Take 1 tablet by mouth every 6 (six) hours as needed for severe pain. 08/12/16   Domenic Polite, MD  isosorbide mononitrate (IMDUR) 30 MG 24 hr tablet Take 1 tablet (30 mg total) by mouth daily. 10/10/15   Jettie Booze, MD  loperamide (IMODIUM) 2 MG capsule Take 1 capsule (2 mg total) by mouth as needed for diarrhea or loose stools. 08/12/16   Domenic Polite, MD  meclizine (ANTIVERT) 25 MG tablet Take 25 mg by mouth 2 (two) times daily as needed for dizziness or nausea.     [provider]  pantoprazole (PROTONIX) 40 MG tablet Take 1 tablet (40 mg total) by mouth 2 (two) times daily. For 1 month and then once daily 08/12/16   Domenic Polite, MD  Polyvinyl Alcohol-Povidone (REFRESH OP) Apply 1 drop to eye 2 (two) times daily.    [provider]  Prenatal Vit-Fe Fumarate-FA (PRENATAL PO) Take 1 tablet by mouth daily.    [provider]  venlafaxine XR (EFFEXOR-XR) 75 MG 24 hr capsule Take 75 mg by mouth daily. 01/08/16   [provider]    Family History Family History  Problem Relation Age of Onset  . Heart disease Father   . Heart attack Father   . Heart disease Mother   . Heart attack Mother   . Heart disease Daughter   . Heart disease Son   . Cancer Brother   . Heart disease Sister   . Heart attack Sister        X2  . Heart attack Brother   . Hypertension Brother   . Stroke Neg Hx     Social History Social History  Substance Use Topics  . Smoking status: Never Smoker  . Smokeless tobacco: Never Used  . Alcohol use Yes     Comment: occasional cocktail     Allergies   Ace inhibitors; Streptomycin; Tramadol; Vesicare [solifenacin succinate]; and Penicillins   Review of Systems Review of Systems  All other systems reviewed and are negative.    Physical Exam Updated Vital Signs BP (!) 132/121 (BP  Location: Left Arm)   Pulse 91   Temp 97.6 F (36.4 C) (Oral)   Resp 15   Wt 68 kg (150 lb)   SpO2 98%   BMI 24.21 kg/m   Physical Exam  Constitutional: She is oriented to person, place, and time. She appears well-developed and well-nourished. No distress.  HENT:  Head: Normocephalic and atraumatic.  Mouth/Throat: No oropharyngeal exudate.  Eyes: Pupils are equal, round, and reactive to light. Conjunctivae and EOM are normal. Right eye exhibits no discharge. Left eye exhibits no discharge. No scleral icterus.  Cardiovascular: Normal rate, regular rhythm, normal heart sounds and intact distal pulses.  Exam reveals no gallop and no friction rub.   No murmur heard. Pulmonary/Chest: Effort normal and breath sounds normal. No respiratory distress. She has no wheezes. She has no rales. She exhibits no tenderness.  Abdominal: Soft. She exhibits no distension. There is no tenderness. There is no guarding.  Musculoskeletal: Normal range of motion. She exhibits no edema.  TTP has midline thoracic and lumbar spine without any step-offs or obvious bony deformities. She also has TTP over her right rib cage, bilateral hips, anterior aspect of left knee. She has no obvious deformities and has full range of motion in every extremity.  Neurological: She is alert and oriented to person, place, and time.  Strength 5/5 throughout. No sensory deficits. No gait abnormality. No dysmetria. No slurred speech. No facial droop. Negative pronator drift.    Skin: Skin is warm and dry. No rash noted. She is not diaphoretic. No erythema. No pallor.  Psychiatric: She has a normal mood and affect. Her behavior is normal.  Nursing note and vitals reviewed.    ED Treatments / Results  Labs (all labs ordered are listed, but only abnormal results are displayed) Labs Reviewed - No data to display  EKG  EKG Interpretation None       Radiology No results found.  Procedures Procedures (including critical care  time)  Medications Ordered in ED Medications - No data to display   Initial Impression / Assessment and Plan / ED Course  I have reviewed the triage vital signs and the nursing notes.  Pertinent labs & imaging results that were available during my care of the patient were reviewed by me and considered in my medical decision making (see chart for details).     83 year old female presented to the ED today after a presumed mechanical fall. She was sitting on her toilet when she tried to get up and fell off. Vallarie Mare partially denied use her walker prior to going to the bathroom which she normally uses at baseline. However patient did report that she is unsure if she passed out. She denies any chest pain, headache, change in vision. She is not sure if she hit her head. Per this reason she had a CT scan of her head and her cervical spine which was unremarkable without acute abnormality. She also complained of mid to lower back pain as well as bilateral hip pain and right rib cage pain, left knee pain. Each of these were examined via x-ray which did not show any acute fracture or dislocation. Chest and EKG to rule out cardiac etiology of potential syncope, which did not show any evidence of ischemia. She also had unremarkable lab work including hemoglobin of 13.3. No metabolic derangement. Pt then ambulated in the ED with a walker. Spoke with her family who feel comfortable taking her home. They have a PCP appointment in 4 days.  Return precautions outlined in patient discharge instructions.  Patient was discussed with and seen by Dr. Oleta Mouse who agrees with the treatment plan.     Final Clinical Impressions(s) / ED Diagnoses   Final diagnoses:  Fall, initial encounter    New Prescriptions New Prescriptions   No medications on file     Carlos Levering, PA-C 09/05/16 1621    Forde Dandy, MD 09/05/16 [phone]

## 2016-09-05 NOTE — Discharge Instructions (Signed)
You were seen today after a fall at home. All of the x-rays were negative for fracture or dislocation. Your blood work was also normal. Please follow-up with your primary care doctor on Monday. Please do not ambulate without the assistance of her walker. Return to the ER a few experience repeat fall, head injury, loss control of your bowels or bladder, weakness in extremity.

## 2016-09-05 NOTE — ED Triage Notes (Signed)
Per EMS pt fall in attempt to transfer to toilet, slipped on urine on floor. Pt complaint of right rib, right hip, left knee, and bilateral ankle pain post fall. FOM, no LOC, no neck/back pain. Pt off of blood thinner for a month.

## 2016-09-05 NOTE — ED Notes (Signed)
Bed: WA20 Expected date:  Expected time:  Means of arrival:  Comments: 83 yo F  Fall, right rib pain

## 2016-09-06 DIAGNOSIS — I129 Hypertensive chronic kidney disease with stage 1 through stage 4 chronic kidney disease, or unspecified chronic kidney disease: Secondary | ICD-10-CM | POA: Diagnosis not present

## 2016-09-06 DIAGNOSIS — N183 Chronic kidney disease, stage 3 (moderate): Secondary | ICD-10-CM | POA: Diagnosis not present

## 2016-09-06 DIAGNOSIS — M6281 Muscle weakness (generalized): Secondary | ICD-10-CM | POA: Diagnosis not present

## 2016-09-06 DIAGNOSIS — F329 Major depressive disorder, single episode, unspecified: Secondary | ICD-10-CM | POA: Diagnosis not present

## 2016-09-09 DIAGNOSIS — I129 Hypertensive chronic kidney disease with stage 1 through stage 4 chronic kidney disease, or unspecified chronic kidney disease: Secondary | ICD-10-CM | POA: Diagnosis not present

## 2016-09-09 DIAGNOSIS — I5032 Chronic diastolic (congestive) heart failure: Secondary | ICD-10-CM | POA: Diagnosis not present

## 2016-09-09 DIAGNOSIS — I509 Heart failure, unspecified: Secondary | ICD-10-CM | POA: Diagnosis not present

## 2016-09-09 DIAGNOSIS — K922 Gastrointestinal hemorrhage, unspecified: Secondary | ICD-10-CM | POA: Diagnosis not present

## 2016-09-09 DIAGNOSIS — M6281 Muscle weakness (generalized): Secondary | ICD-10-CM | POA: Diagnosis not present

## 2016-09-09 DIAGNOSIS — I10 Essential (primary) hypertension: Secondary | ICD-10-CM | POA: Diagnosis not present

## 2016-09-09 DIAGNOSIS — N183 Chronic kidney disease, stage 3 (moderate): Secondary | ICD-10-CM | POA: Diagnosis not present

## 2016-09-09 DIAGNOSIS — F329 Major depressive disorder, single episode, unspecified: Secondary | ICD-10-CM | POA: Diagnosis not present

## 2016-09-10 DIAGNOSIS — D2371 Other benign neoplasm of skin of right lower limb, including hip: Secondary | ICD-10-CM | POA: Diagnosis not present

## 2016-09-10 DIAGNOSIS — L97521 Non-pressure chronic ulcer of other part of left foot limited to breakdown of skin: Secondary | ICD-10-CM | POA: Diagnosis not present

## 2016-09-11 DIAGNOSIS — N183 Chronic kidney disease, stage 3 (moderate): Secondary | ICD-10-CM | POA: Diagnosis not present

## 2016-09-11 DIAGNOSIS — M6281 Muscle weakness (generalized): Secondary | ICD-10-CM | POA: Diagnosis not present

## 2016-09-11 DIAGNOSIS — F329 Major depressive disorder, single episode, unspecified: Secondary | ICD-10-CM | POA: Diagnosis not present

## 2016-09-11 DIAGNOSIS — I129 Hypertensive chronic kidney disease with stage 1 through stage 4 chronic kidney disease, or unspecified chronic kidney disease: Secondary | ICD-10-CM | POA: Diagnosis not present

## 2016-09-13 DIAGNOSIS — I129 Hypertensive chronic kidney disease with stage 1 through stage 4 chronic kidney disease, or unspecified chronic kidney disease: Secondary | ICD-10-CM | POA: Diagnosis not present

## 2016-09-13 DIAGNOSIS — N183 Chronic kidney disease, stage 3 (moderate): Secondary | ICD-10-CM | POA: Diagnosis not present

## 2016-09-13 DIAGNOSIS — M6281 Muscle weakness (generalized): Secondary | ICD-10-CM | POA: Diagnosis not present

## 2016-09-13 DIAGNOSIS — F329 Major depressive disorder, single episode, unspecified: Secondary | ICD-10-CM | POA: Diagnosis not present

## 2016-09-16 DIAGNOSIS — I129 Hypertensive chronic kidney disease with stage 1 through stage 4 chronic kidney disease, or unspecified chronic kidney disease: Secondary | ICD-10-CM | POA: Diagnosis not present

## 2016-09-16 DIAGNOSIS — F329 Major depressive disorder, single episode, unspecified: Secondary | ICD-10-CM | POA: Diagnosis not present

## 2016-09-16 DIAGNOSIS — N183 Chronic kidney disease, stage 3 (moderate): Secondary | ICD-10-CM | POA: Diagnosis not present

## 2016-09-16 DIAGNOSIS — M6281 Muscle weakness (generalized): Secondary | ICD-10-CM | POA: Diagnosis not present

## 2016-09-18 DIAGNOSIS — F329 Major depressive disorder, single episode, unspecified: Secondary | ICD-10-CM | POA: Diagnosis not present

## 2016-09-18 DIAGNOSIS — M6281 Muscle weakness (generalized): Secondary | ICD-10-CM | POA: Diagnosis not present

## 2016-09-18 DIAGNOSIS — N183 Chronic kidney disease, stage 3 (moderate): Secondary | ICD-10-CM | POA: Diagnosis not present

## 2016-09-18 DIAGNOSIS — I129 Hypertensive chronic kidney disease with stage 1 through stage 4 chronic kidney disease, or unspecified chronic kidney disease: Secondary | ICD-10-CM | POA: Diagnosis not present

## 2016-09-19 DIAGNOSIS — F329 Major depressive disorder, single episode, unspecified: Secondary | ICD-10-CM | POA: Diagnosis not present

## 2016-09-19 DIAGNOSIS — M6281 Muscle weakness (generalized): Secondary | ICD-10-CM | POA: Diagnosis not present

## 2016-09-19 DIAGNOSIS — I129 Hypertensive chronic kidney disease with stage 1 through stage 4 chronic kidney disease, or unspecified chronic kidney disease: Secondary | ICD-10-CM | POA: Diagnosis not present

## 2016-09-19 DIAGNOSIS — N183 Chronic kidney disease, stage 3 (moderate): Secondary | ICD-10-CM | POA: Diagnosis not present

## 2016-09-20 ENCOUNTER — Emergency Department (HOSPITAL_COMMUNITY)
Admission: EM | Admit: 2016-09-20 | Discharge: 2016-09-20 | Disposition: A | Discharge status: Home / Self Care | Payer: Medicare Other | Attending: Emergency Medicine | Admitting: Emergency Medicine

## 2016-09-20 ENCOUNTER — Encounter (HOSPITAL_COMMUNITY): Payer: Self-pay | Admitting: Emergency Medicine

## 2016-09-20 DIAGNOSIS — Z5321 Procedure and treatment not carried out due to patient leaving prior to being seen by health care provider: Secondary | ICD-10-CM | POA: Insufficient documentation

## 2016-09-20 DIAGNOSIS — K625 Hemorrhage of anus and rectum: Secondary | ICD-10-CM | POA: Insufficient documentation

## 2016-09-20 DIAGNOSIS — M6281 Muscle weakness (generalized): Secondary | ICD-10-CM | POA: Diagnosis not present

## 2016-09-20 DIAGNOSIS — N183 Chronic kidney disease, stage 3 (moderate): Secondary | ICD-10-CM | POA: Diagnosis not present

## 2016-09-20 DIAGNOSIS — F329 Major depressive disorder, single episode, unspecified: Secondary | ICD-10-CM | POA: Diagnosis not present

## 2016-09-20 DIAGNOSIS — I129 Hypertensive chronic kidney disease with stage 1 through stage 4 chronic kidney disease, or unspecified chronic kidney disease: Secondary | ICD-10-CM | POA: Diagnosis not present

## 2016-09-20 LAB — COMPREHENSIVE METABOLIC PANEL
ALBUMIN: 3.8 g/dL (ref 3.5–5.0)
ALT: 10 U/L — ABNORMAL LOW (ref 14–54)
ANION GAP: 9 (ref 5–15)
AST: 25 U/L (ref 15–41)
Alkaline Phosphatase: 59 U/L (ref 38–126)
BUN: 15 mg/dL (ref 6–20)
CHLORIDE: 109 mmol/L (ref 101–111)
CO2: 24 mmol/L (ref 22–32)
Calcium: 9.4 mg/dL (ref 8.9–10.3)
Creatinine, Ser: 0.97 mg/dL (ref 0.44–1.00)
GFR calc non Af Amer: 47 mL/min — ABNORMAL LOW (ref >60)
GFR, EST AFRICAN AMERICAN: 55 mL/min — AB (ref >60)
Glucose, Bld: 114 mg/dL — ABNORMAL HIGH (ref 65–99)
Potassium: 4.1 mmol/L (ref 3.5–5.1)
SODIUM: 142 mmol/L (ref 135–145)
Total Bilirubin: 0.3 mg/dL (ref 0.3–1.2)
Total Protein: 7.1 g/dL (ref 6.5–8.1)

## 2016-09-20 LAB — CBC
HEMATOCRIT: 39.4 % (ref 36.0–46.0)
HEMOGLOBIN: 12.8 g/dL (ref 12.0–15.0)
MCH: 30.2 pg (ref 26.0–34.0)
MCHC: 32.5 g/dL (ref 30.0–36.0)
MCV: 92.9 fL (ref 78.0–100.0)
Platelets: 201 10*3/uL (ref 150–400)
RBC: 4.24 MIL/uL (ref 3.87–5.11)
RDW: 16.2 % — ABNORMAL HIGH (ref 11.5–15.5)
WBC: 8.6 10*3/uL (ref 4.0–10.5)

## 2016-09-20 LAB — TYPE AND SCREEN
ABO/RH(D): A NEG
Antibody Screen: NEGATIVE

## 2016-09-20 NOTE — ED Triage Notes (Signed)
Patient reports rectal bleeding that is bright red that started today around 5pm. Patient denies any pain at this time and also PMH of the same.

## 2016-09-23 DIAGNOSIS — K648 Other hemorrhoids: Secondary | ICD-10-CM | POA: Diagnosis not present

## 2016-09-24 DIAGNOSIS — N183 Chronic kidney disease, stage 3 (moderate): Secondary | ICD-10-CM | POA: Diagnosis not present

## 2016-09-24 DIAGNOSIS — M6281 Muscle weakness (generalized): Secondary | ICD-10-CM | POA: Diagnosis not present

## 2016-09-24 DIAGNOSIS — I129 Hypertensive chronic kidney disease with stage 1 through stage 4 chronic kidney disease, or unspecified chronic kidney disease: Secondary | ICD-10-CM | POA: Diagnosis not present

## 2016-09-24 DIAGNOSIS — F329 Major depressive disorder, single episode, unspecified: Secondary | ICD-10-CM | POA: Diagnosis not present

## 2016-09-27 DIAGNOSIS — I129 Hypertensive chronic kidney disease with stage 1 through stage 4 chronic kidney disease, or unspecified chronic kidney disease: Secondary | ICD-10-CM | POA: Diagnosis not present

## 2016-09-27 DIAGNOSIS — N183 Chronic kidney disease, stage 3 (moderate): Secondary | ICD-10-CM | POA: Diagnosis not present

## 2016-09-27 DIAGNOSIS — M6281 Muscle weakness (generalized): Secondary | ICD-10-CM | POA: Diagnosis not present

## 2016-09-27 DIAGNOSIS — F329 Major depressive disorder, single episode, unspecified: Secondary | ICD-10-CM | POA: Diagnosis not present

## 2016-10-02 DIAGNOSIS — M6281 Muscle weakness (generalized): Secondary | ICD-10-CM | POA: Diagnosis not present

## 2016-10-02 DIAGNOSIS — N183 Chronic kidney disease, stage 3 (moderate): Secondary | ICD-10-CM | POA: Diagnosis not present

## 2016-10-02 DIAGNOSIS — F329 Major depressive disorder, single episode, unspecified: Secondary | ICD-10-CM | POA: Diagnosis not present

## 2016-10-02 DIAGNOSIS — I129 Hypertensive chronic kidney disease with stage 1 through stage 4 chronic kidney disease, or unspecified chronic kidney disease: Secondary | ICD-10-CM | POA: Diagnosis not present

## 2016-10-09 DIAGNOSIS — F329 Major depressive disorder, single episode, unspecified: Secondary | ICD-10-CM | POA: Diagnosis not present

## 2016-10-09 DIAGNOSIS — I129 Hypertensive chronic kidney disease with stage 1 through stage 4 chronic kidney disease, or unspecified chronic kidney disease: Secondary | ICD-10-CM | POA: Diagnosis not present

## 2016-10-09 DIAGNOSIS — N183 Chronic kidney disease, stage 3 (moderate): Secondary | ICD-10-CM | POA: Diagnosis not present

## 2016-10-09 DIAGNOSIS — M6281 Muscle weakness (generalized): Secondary | ICD-10-CM | POA: Diagnosis not present

## 2016-10-14 DIAGNOSIS — I509 Heart failure, unspecified: Secondary | ICD-10-CM | POA: Diagnosis not present

## 2016-10-14 DIAGNOSIS — I10 Essential (primary) hypertension: Secondary | ICD-10-CM | POA: Diagnosis not present

## 2016-10-14 DIAGNOSIS — I5032 Chronic diastolic (congestive) heart failure: Secondary | ICD-10-CM | POA: Diagnosis not present

## 2016-10-14 DIAGNOSIS — R61 Generalized hyperhidrosis: Secondary | ICD-10-CM | POA: Diagnosis not present

## 2016-10-14 DIAGNOSIS — K922 Gastrointestinal hemorrhage, unspecified: Secondary | ICD-10-CM | POA: Diagnosis not present

## 2016-10-14 DIAGNOSIS — K649 Unspecified hemorrhoids: Secondary | ICD-10-CM | POA: Diagnosis not present

## 2016-10-14 DIAGNOSIS — Z23 Encounter for immunization: Secondary | ICD-10-CM | POA: Diagnosis not present

## 2016-10-16 DIAGNOSIS — I129 Hypertensive chronic kidney disease with stage 1 through stage 4 chronic kidney disease, or unspecified chronic kidney disease: Secondary | ICD-10-CM | POA: Diagnosis not present

## 2016-10-16 DIAGNOSIS — F329 Major depressive disorder, single episode, unspecified: Secondary | ICD-10-CM | POA: Diagnosis not present

## 2016-10-16 DIAGNOSIS — M6281 Muscle weakness (generalized): Secondary | ICD-10-CM | POA: Diagnosis not present

## 2016-10-16 DIAGNOSIS — N183 Chronic kidney disease, stage 3 (moderate): Secondary | ICD-10-CM | POA: Diagnosis not present

## 2016-10-27 DIAGNOSIS — I2510 Atherosclerotic heart disease of native coronary artery without angina pectoris: Secondary | ICD-10-CM | POA: Diagnosis not present

## 2016-10-27 DIAGNOSIS — I5032 Chronic diastolic (congestive) heart failure: Secondary | ICD-10-CM | POA: Diagnosis not present

## 2016-10-27 DIAGNOSIS — I10 Essential (primary) hypertension: Secondary | ICD-10-CM | POA: Diagnosis not present

## 2016-10-27 DIAGNOSIS — M179 Osteoarthritis of knee, unspecified: Secondary | ICD-10-CM | POA: Diagnosis not present

## 2016-10-27 DIAGNOSIS — F341 Dysthymic disorder: Secondary | ICD-10-CM | POA: Diagnosis not present

## 2016-10-27 DIAGNOSIS — N183 Chronic kidney disease, stage 3 (moderate): Secondary | ICD-10-CM | POA: Diagnosis not present

## 2016-10-27 DIAGNOSIS — M1712 Unilateral primary osteoarthritis, left knee: Secondary | ICD-10-CM | POA: Diagnosis not present

## 2016-10-27 DIAGNOSIS — F329 Major depressive disorder, single episode, unspecified: Secondary | ICD-10-CM | POA: Diagnosis not present

## 2016-10-27 DIAGNOSIS — I25111 Atherosclerotic heart disease of native coronary artery with angina pectoris with documented spasm: Secondary | ICD-10-CM | POA: Diagnosis not present

## 2016-10-27 DIAGNOSIS — I509 Heart failure, unspecified: Secondary | ICD-10-CM | POA: Diagnosis not present

## 2016-10-27 DIAGNOSIS — F322 Major depressive disorder, single episode, severe without psychotic features: Secondary | ICD-10-CM | POA: Diagnosis not present

## 2016-10-28 DIAGNOSIS — N183 Chronic kidney disease, stage 3 (moderate): Secondary | ICD-10-CM | POA: Diagnosis not present

## 2016-10-28 DIAGNOSIS — F329 Major depressive disorder, single episode, unspecified: Secondary | ICD-10-CM | POA: Diagnosis not present

## 2016-10-28 DIAGNOSIS — I129 Hypertensive chronic kidney disease with stage 1 through stage 4 chronic kidney disease, or unspecified chronic kidney disease: Secondary | ICD-10-CM | POA: Diagnosis not present

## 2016-10-28 DIAGNOSIS — M6281 Muscle weakness (generalized): Secondary | ICD-10-CM | POA: Diagnosis not present

## 2016-11-12 DIAGNOSIS — K922 Gastrointestinal hemorrhage, unspecified: Secondary | ICD-10-CM | POA: Diagnosis not present

## 2016-11-12 DIAGNOSIS — I10 Essential (primary) hypertension: Secondary | ICD-10-CM | POA: Diagnosis not present

## 2016-11-12 DIAGNOSIS — Z23 Encounter for immunization: Secondary | ICD-10-CM | POA: Diagnosis not present

## 2016-11-12 DIAGNOSIS — K649 Unspecified hemorrhoids: Secondary | ICD-10-CM | POA: Diagnosis not present

## 2016-11-12 DIAGNOSIS — R61 Generalized hyperhidrosis: Secondary | ICD-10-CM | POA: Diagnosis not present

## 2016-11-12 DIAGNOSIS — I509 Heart failure, unspecified: Secondary | ICD-10-CM | POA: Diagnosis not present

## 2016-11-12 DIAGNOSIS — I5032 Chronic diastolic (congestive) heart failure: Secondary | ICD-10-CM | POA: Diagnosis not present

## 2016-11-21 DIAGNOSIS — H52223 Regular astigmatism, bilateral: Secondary | ICD-10-CM | POA: Diagnosis not present

## 2016-11-21 DIAGNOSIS — H1045 Other chronic allergic conjunctivitis: Secondary | ICD-10-CM | POA: Diagnosis not present

## 2016-11-21 DIAGNOSIS — H5203 Hypermetropia, bilateral: Secondary | ICD-10-CM | POA: Diagnosis not present

## 2016-11-21 DIAGNOSIS — Z961 Presence of intraocular lens: Secondary | ICD-10-CM | POA: Diagnosis not present

## 2016-11-21 DIAGNOSIS — H524 Presbyopia: Secondary | ICD-10-CM | POA: Diagnosis not present

## 2016-12-05 DIAGNOSIS — L84 Corns and callosities: Secondary | ICD-10-CM | POA: Diagnosis not present

## 2016-12-05 DIAGNOSIS — L603 Nail dystrophy: Secondary | ICD-10-CM | POA: Diagnosis not present

## 2016-12-05 DIAGNOSIS — I739 Peripheral vascular disease, unspecified: Secondary | ICD-10-CM | POA: Diagnosis not present

## 2016-12-11 NOTE — Progress Notes (Signed)
Cardiology Office Note   Date:  12/12/2016   ID:  [name], DOB [birth year], MRN [id number]  PCP:  Josetta Huddle, MD    No chief complaint on file.  Abnormal stress test  Wt Readings from Last 3 Encounters:  12/12/16 136 lb 3.2 oz (61.8 kg)  09/05/16 150 lb (68 kg)  08/10/16 150 lb (68 kg)       History of Present Illness: [name] is a 83 y.o. female  who had an abnormal stress test several years ago.  She has been medically managed for this.  She has had chronic DOE.  She was hospitalized in August 2016 for UTI which led to Escherichia coli bacteremia.  Echo in 5/18: Left ventricle: Abnormal septal motion. The cavity size was mildly dilated. Wall thickness was normal. Systolic function was mildly reduced. The estimated ejection fraction was in the range of 45% to 50%. Doppler parameters are consistent with both elevated ventricular end-diastolic filling pressure and elevated left atrial filling pressure. - Mitral valve: There was mild regurgitation. - Atrial septum: No defect or patent foramen ovale was identified. - Tricuspid valve: There was mild-moderate regurgitation. - Pulmonary arteries: PA peak pressure: 42 mm Hg (S).  She was hospitalized for GI bleed in July 2018.  She had UGI, no EGD due to age.  She feels that she has not gotten back to the level where she was.    SHe was in rehab.  She had HHPT.  She tries to walk.  No exertional chest pain.      Past Medical History:  Diagnosis Date  . Allergic rhinitis   . CAD (coronary artery disease)   . CHF (congestive heart failure) ([place])   . CKD (chronic kidney disease) stage 3, GFR 30-59 ml/min ([facility]) 09/12/2014  . Diverticular disease   . Diverticulosis   . DJD (degenerative joint disease) of knee   . DJD (degenerative joint disease) of lumbar spine    scoliosis  . Eczema   . Esophageal erosions    from nsaid   . GI bleed   . Hormone replacement therapy (postmenopausal)     . Hyperglycemia   . Hyperlipidemia   . Hypertension   . Pneumonia    july 2009  . Pulmonary hypertension ([place])    a. 2D echo 03/2008 showed normal biventricular thickness, chamber size and systolic function, EF 66-44%, mild mitral regurgitation, aortic sclerosis, moderate pulmonary HTN, pulmonic regurgitation.  Marland Kitchen PVC's (premature ventricular contractions)   . Reactive depression (situational)   . Sciatica    right lower extremity  . Shingles    right thoracic 2008  . Vertigo     Past Surgical History:  Procedure Laterality Date  . APPENDECTOMY    . BACK SURGERY    . BREAST LUMPECTOMY    . FLEXIBLE SIGMOIDOSCOPY Left 08/13/2013   Procedure: FLEXIBLE SIGMOIDOSCOPY;  Surgeon: Arta Silence, MD;  [facility]: [facility];  Service: Endoscopy;  Laterality: Left;  . TOTAL ABDOMINAL HYSTERECTOMY       Current Outpatient Medications  Medication Sig Dispense Refill  . acetaminophen (TYLENOL) 500 MG tablet Take 500 mg every 6 (six) hours as needed by mouth.    . clotrimazole (LOTRIMIN) 1 % cream Apply 1 application topically as needed (foot -- infection/rash).     . furosemide (LASIX) 40 MG tablet Take 0.5 tablets by mouth. Take 0.5 tablet by mouth Monday, Wednesday, Friday  0  . hydrocortisone (ANUSOL-HC) 25 MG suppository Place 25 mg  rectally 2 (two) times daily as needed for hemorrhoids or anal itching.    . isosorbide mononitrate (IMDUR) 30 MG 24 hr tablet Take 1 tablet (30 mg total) by mouth daily. 90 tablet 3  . loperamide (IMODIUM) 2 MG capsule Take 1 capsule (2 mg total) by mouth as needed for diarrhea or loose stools. 30 capsule 0  . meclizine (ANTIVERT) 25 MG tablet Take 25 mg by mouth 2 (two) times daily as needed for dizziness or nausea.     . metoprolol succinate (TOPROL-XL) 25 MG 24 hr tablet Take 25 mg by mouth 2 (two) times daily.    . Polyvinyl Alcohol-Povidone (REFRESH OP) Apply 1 drop to eye 2 (two) times daily.    . potassium chloride SA (K-DUR,KLOR-CON) 20 MEQ tablet  Take 20 mEq by mouth 2 (two) times daily.    Marland Kitchen PREMARIN 0.3 MG tablet Take 0.3 mg daily by mouth.  0  . Prenatal Vit-Fe Fumarate-FA (PRENATAL PO) Take 1 tablet by mouth daily.     No current facility-administered medications for this visit.     Allergies:   Ace inhibitors; Streptomycin; Tramadol; Vesicare [solifenacin succinate]; and Penicillins    Social History:  The patient  reports that  has never smoked. she has never used smokeless tobacco. She reports that she drinks alcohol. She reports that she does not use drugs.   Family History:  The patient's family history includes Cancer in her brother; Heart attack in her brother, father, mother, and sister; Heart disease in her daughter, father, mother, sister, and son; Hypertension in her brother.    ROS:  Please see the history of present illness.   Otherwise, review of systems are positive for fatigue.   All other systems are reviewed and negative.    PHYSICAL EXAM: VS:  BP 124/68   Pulse 68   Ht 5\' 6"  (1.676 m)   Wt 136 lb 3.2 oz (61.8 kg)   SpO2 97%   BMI 21.98 kg/m  , BMI Body mass index is 21.98 kg/m. GEN: Well nourished, well developed, in no acute distress  HEENT: normal  Neck: no JVD, carotid bruits, or masses Cardiac: RRR, premature beats; no murmurs, rubs, or gallops,no edema  Respiratory:  clear to auscultation bilaterally, normal work of breathing GI: soft, nontender, nondistended, + BS MS: no deformity or atrophy  Skin: warm and dry, no rash Neuro:  Strength and sensation are intact Psych: euthymic mood, full affect    Recent Labs: 05/28/2016: NT-Pro BNP 411 08/04/2016: B Natriuretic Peptide 62.8 09/20/2016: ALT 10; BUN 15; Creatinine, Ser 0.97; Hemoglobin 12.8; Platelets 201; Potassium 4.1; Sodium 142   Lipid Panel No results found for: CHOL, TRIG, HDL, CHOLHDL, VLDL, LDLCALC, LDLDIRECT   Other studies Reviewed: Additional studies/ records that were reviewed today with results demonstrating: Hospital  records reviewed.  Lab work reviewed..   ASSESSMENT AND PLAN:  1. Abnormal stress test: Sx controlled with medicine.  WOuld not plan for elective ischemia eval given her age.   2. Shortness of breath: Increase activity.  Likely from deconditioning.  Worse since she was in the hospital. 3. HTN: Well-controlled.  Continue current medications. 4. Recent ulcers: Hemoglobin checked in October was over 14. 5. Palpitations: occasional flutter reported.  Premature beats noted on exam.  Likely PVCs.   Current medicines are reviewed at length with the patient today.  The patient concerns regarding her medicines were addressed.  The following changes have been made:  No change  Labs/ tests ordered today  include:  No orders of the defined types were placed in this encounter.   Recommend 150 minutes/week of aerobic exercise Low fat, low carb, high fiber diet recommended  Disposition:   FU in 6 months   Signed, Larae Grooms, MD  12/12/2016 11:05 AM    [facility] [place], [place], [place]  [address] Phone: [phone]; Fax: [phone]

## 2016-12-12 ENCOUNTER — Ambulatory Visit: Payer: Medicare Other | Admitting: Interventional Cardiology

## 2016-12-12 ENCOUNTER — Ambulatory Visit (INDEPENDENT_AMBULATORY_CARE_PROVIDER_SITE_OTHER): Payer: Medicare Other | Admitting: Interventional Cardiology

## 2016-12-12 ENCOUNTER — Encounter: Payer: Self-pay | Admitting: Interventional Cardiology

## 2016-12-12 VITALS — BP 124/68 | HR 68 | Ht 66.0 in | Wt 136.2 lb

## 2016-12-12 DIAGNOSIS — I493 Ventricular premature depolarization: Secondary | ICD-10-CM

## 2016-12-12 DIAGNOSIS — I10 Essential (primary) hypertension: Secondary | ICD-10-CM

## 2016-12-12 DIAGNOSIS — R9439 Abnormal result of other cardiovascular function study: Secondary | ICD-10-CM | POA: Diagnosis not present

## 2016-12-12 DIAGNOSIS — R0609 Other forms of dyspnea: Secondary | ICD-10-CM | POA: Diagnosis not present

## 2016-12-12 NOTE — Patient Instructions (Signed)

## 2017-01-14 DIAGNOSIS — H00022 Hordeolum internum right lower eyelid: Secondary | ICD-10-CM | POA: Diagnosis not present

## 2017-03-19 DIAGNOSIS — I509 Heart failure, unspecified: Secondary | ICD-10-CM | POA: Diagnosis not present

## 2017-03-19 DIAGNOSIS — R61 Generalized hyperhidrosis: Secondary | ICD-10-CM | POA: Diagnosis not present

## 2017-03-19 DIAGNOSIS — K649 Unspecified hemorrhoids: Secondary | ICD-10-CM | POA: Diagnosis not present

## 2017-03-19 DIAGNOSIS — K922 Gastrointestinal hemorrhage, unspecified: Secondary | ICD-10-CM | POA: Diagnosis not present

## 2017-03-19 DIAGNOSIS — K625 Hemorrhage of anus and rectum: Secondary | ICD-10-CM | POA: Diagnosis not present

## 2017-03-19 DIAGNOSIS — I5032 Chronic diastolic (congestive) heart failure: Secondary | ICD-10-CM | POA: Diagnosis not present

## 2017-03-19 DIAGNOSIS — I10 Essential (primary) hypertension: Secondary | ICD-10-CM | POA: Diagnosis not present

## 2017-03-24 DIAGNOSIS — L97522 Non-pressure chronic ulcer of other part of left foot with fat layer exposed: Secondary | ICD-10-CM | POA: Diagnosis not present

## 2017-03-28 DIAGNOSIS — L603 Nail dystrophy: Secondary | ICD-10-CM | POA: Diagnosis not present

## 2017-03-28 DIAGNOSIS — L84 Corns and callosities: Secondary | ICD-10-CM | POA: Diagnosis not present

## 2017-03-28 DIAGNOSIS — I739 Peripheral vascular disease, unspecified: Secondary | ICD-10-CM | POA: Diagnosis not present

## 2017-03-28 DIAGNOSIS — L97522 Non-pressure chronic ulcer of other part of left foot with fat layer exposed: Secondary | ICD-10-CM | POA: Diagnosis not present

## 2017-04-07 DIAGNOSIS — M71572 Other bursitis, not elsewhere classified, left ankle and foot: Secondary | ICD-10-CM | POA: Diagnosis not present

## 2017-04-07 DIAGNOSIS — L97522 Non-pressure chronic ulcer of other part of left foot with fat layer exposed: Secondary | ICD-10-CM | POA: Diagnosis not present

## 2017-04-07 DIAGNOSIS — M722 Plantar fascial fibromatosis: Secondary | ICD-10-CM | POA: Diagnosis not present

## 2017-05-08 DIAGNOSIS — L97521 Non-pressure chronic ulcer of other part of left foot limited to breakdown of skin: Secondary | ICD-10-CM | POA: Diagnosis not present

## 2017-05-08 DIAGNOSIS — L97511 Non-pressure chronic ulcer of other part of right foot limited to breakdown of skin: Secondary | ICD-10-CM | POA: Diagnosis not present

## 2017-05-28 DIAGNOSIS — F322 Major depressive disorder, single episode, severe without psychotic features: Secondary | ICD-10-CM | POA: Diagnosis not present

## 2017-05-28 DIAGNOSIS — F341 Dysthymic disorder: Secondary | ICD-10-CM | POA: Diagnosis not present

## 2017-05-28 DIAGNOSIS — I10 Essential (primary) hypertension: Secondary | ICD-10-CM | POA: Diagnosis not present

## 2017-05-28 DIAGNOSIS — I5032 Chronic diastolic (congestive) heart failure: Secondary | ICD-10-CM | POA: Diagnosis not present

## 2017-05-28 DIAGNOSIS — M169 Osteoarthritis of hip, unspecified: Secondary | ICD-10-CM | POA: Diagnosis not present

## 2017-05-28 DIAGNOSIS — M1712 Unilateral primary osteoarthritis, left knee: Secondary | ICD-10-CM | POA: Diagnosis not present

## 2017-05-28 DIAGNOSIS — I509 Heart failure, unspecified: Secondary | ICD-10-CM | POA: Diagnosis not present

## 2017-05-28 DIAGNOSIS — M179 Osteoarthritis of knee, unspecified: Secondary | ICD-10-CM | POA: Diagnosis not present

## 2017-05-28 DIAGNOSIS — N183 Chronic kidney disease, stage 3 (moderate): Secondary | ICD-10-CM | POA: Diagnosis not present

## 2017-05-28 DIAGNOSIS — I25111 Atherosclerotic heart disease of native coronary artery with angina pectoris with documented spasm: Secondary | ICD-10-CM | POA: Diagnosis not present

## 2017-05-29 DIAGNOSIS — L600 Ingrowing nail: Secondary | ICD-10-CM | POA: Diagnosis not present

## 2017-05-29 DIAGNOSIS — M71571 Other bursitis, not elsewhere classified, right ankle and foot: Secondary | ICD-10-CM | POA: Diagnosis not present

## 2017-05-29 DIAGNOSIS — M71572 Other bursitis, not elsewhere classified, left ankle and foot: Secondary | ICD-10-CM | POA: Diagnosis not present

## 2017-06-30 ENCOUNTER — Ambulatory Visit
Admission: RE | Admit: 2017-06-30 | Discharge: 2017-06-30 | Disposition: A | Discharge status: Home / Self Care | Payer: Medicare Other | Source: Ambulatory Visit | Attending: Internal Medicine | Admitting: Internal Medicine

## 2017-06-30 ENCOUNTER — Other Ambulatory Visit: Payer: Self-pay | Admitting: Internal Medicine

## 2017-06-30 DIAGNOSIS — M109 Gout, unspecified: Secondary | ICD-10-CM | POA: Diagnosis not present

## 2017-06-30 DIAGNOSIS — I5032 Chronic diastolic (congestive) heart failure: Secondary | ICD-10-CM

## 2017-06-30 DIAGNOSIS — R51 Headache: Secondary | ICD-10-CM | POA: Diagnosis not present

## 2017-06-30 DIAGNOSIS — I10 Essential (primary) hypertension: Secondary | ICD-10-CM | POA: Diagnosis not present

## 2017-06-30 DIAGNOSIS — R300 Dysuria: Secondary | ICD-10-CM | POA: Diagnosis not present

## 2017-07-06 NOTE — Progress Notes (Deleted)
Cardiology Office Note   Date:  07/06/2017   ID:  [name], DOB 1916/02/23, MRN [id number]  PCP:  Morgan Huddle, MD    No chief complaint on file.    Wt Readings from Last 3 Encounters:  12/12/16 136 lb 3.2 oz (61.8 kg)  09/05/16 150 lb (68 kg)  08/10/16 150 lb (68 kg)       History of Present Illness: [name] is a 84 y.o. female  who had an abnormal stress test several years ago. She has been medically managed for this.  She has had chronic DOE.  She was hospitalized in August 2016 for UTI which led to Escherichia coli bacteremia.  Echo in 5/18: Left ventricle: Abnormal septal motion. The cavity size was mildly dilated. Wall thickness was normal. Systolic function was mildly reduced. The estimated ejection fraction was in the range of 45% to 50%. Doppler parameters are consistent with both elevated ventricular end-diastolic filling pressure and elevated left atrial filling pressure. - Mitral valve: There was mild regurgitation. - Atrial septum: No defect or patent foramen ovale was identified. - Tricuspid valve: There was mild-moderate regurgitation. - Pulmonary arteries: PA peak pressure: 42 mm Hg (S).  She was hospitalized for GI bleed in July 2018.  She had UGI, no EGD due to age.  She feels that she has not gotten back to the level where she was.    SHe was in rehab.  She had HHPT.      Past Medical History:  Diagnosis Date  . Allergic rhinitis   . CAD (coronary artery disease)   . CHF (congestive heart failure) ([place])   . CKD (chronic kidney disease) stage 3, GFR 30-59 ml/min ([facility]) 09/12/2014  . Diverticular disease   . Diverticulosis   . DJD (degenerative joint disease) of knee   . DJD (degenerative joint disease) of lumbar spine    scoliosis  . Eczema   . Esophageal erosions    from nsaid   . GI bleed   . Hormone replacement therapy (postmenopausal)   . Hyperglycemia   . Hyperlipidemia   . Hypertension   .  Pneumonia    july 2009  . Pulmonary hypertension ([place])    a. 2D echo 03/2008 showed normal biventricular thickness, chamber size and systolic function, EF 29-56%, mild mitral regurgitation, aortic sclerosis, moderate pulmonary HTN, pulmonic regurgitation.  Marland Kitchen PVC's (premature ventricular contractions)   . Reactive depression (situational)   . Sciatica    right lower extremity  . Shingles    right thoracic 2008  . Vertigo     Past Surgical History:  Procedure Laterality Date  . APPENDECTOMY    . BACK SURGERY    . BREAST LUMPECTOMY    . FLEXIBLE SIGMOIDOSCOPY Left 08/13/2013   Procedure: FLEXIBLE SIGMOIDOSCOPY;  Surgeon: Arta Silence, MD;  Location: [facility];  Service: Endoscopy;  Laterality: Left;  . TOTAL ABDOMINAL HYSTERECTOMY       Current Outpatient Medications  Medication Sig Dispense Refill  . acetaminophen (TYLENOL) 500 MG tablet Take 500 mg every 6 (six) hours as needed by mouth.    . clotrimazole (LOTRIMIN) 1 % cream Apply 1 application topically as needed (foot -- infection/rash).     . furosemide (LASIX) 40 MG tablet Take 0.5 tablets by mouth. Take 0.5 tablet by mouth Monday, Wednesday, Friday  0  . hydrocortisone (ANUSOL-HC) 25 MG suppository Place 25 mg rectally 2 (two) times daily as needed for hemorrhoids or anal itching.    Marland Kitchen  isosorbide mononitrate (IMDUR) 30 MG 24 hr tablet Take 1 tablet (30 mg total) by mouth daily. 90 tablet 3  . loperamide (IMODIUM) 2 MG capsule Take 1 capsule (2 mg total) by mouth as needed for diarrhea or loose stools. 30 capsule 0  . meclizine (ANTIVERT) 25 MG tablet Take 25 mg by mouth 2 (two) times daily as needed for dizziness or nausea.     . metoprolol succinate (TOPROL-XL) 25 MG 24 hr tablet Take 25 mg by mouth 2 (two) times daily.    . Polyvinyl Alcohol-Povidone (REFRESH OP) Apply 1 drop to eye 2 (two) times daily.    . potassium chloride SA (K-DUR,KLOR-CON) 20 MEQ tablet Take 20 mEq by mouth 2 (two) times daily.    Marland Kitchen PREMARIN 0.3  MG tablet Take 0.3 mg daily by mouth.  0  . Prenatal Vit-Fe Fumarate-FA (PRENATAL PO) Take 1 tablet by mouth daily.     No current facility-administered medications for this visit.     Allergies:   Ace inhibitors; Streptomycin; Tramadol; Vesicare [solifenacin succinate]; and Penicillins    Social History:  The patient  reports that she has never smoked. She has never used smokeless tobacco. She reports that she drinks alcohol. She reports that she does not use drugs.   Family History:  The patient's ***family history includes Cancer in her brother; Heart attack in her brother, father, mother, and sister; Heart disease in her daughter, father, mother, sister, and son; Hypertension in her brother.    ROS:  Please see the history of present illness.   Otherwise, review of systems are positive for ***.   All other systems are reviewed and negative.    PHYSICAL EXAM: VS:  There were no vitals taken for this visit. , BMI There is no height or weight on file to calculate BMI. GEN: Well nourished, well developed, in no acute distress  HEENT: normal  Neck: no JVD, carotid bruits, or masses Cardiac: ***RRR; no murmurs, rubs, or gallops,no edema  Respiratory:  clear to auscultation bilaterally, normal work of breathing GI: soft, nontender, nondistended, + BS MS: no deformity or atrophy  Skin: warm and dry, no rash Neuro:  Strength and sensation are intact Psych: euthymic mood, full affect   EKG:   The ekg ordered today demonstrates ***   Recent Labs: 08/04/2016: B Natriuretic Peptide 62.8 09/20/2016: ALT 10; BUN 15; Creatinine, Ser 0.97; Hemoglobin 12.8; Platelets 201; Potassium 4.1; Sodium 142   Lipid Panel No results found for: CHOL, TRIG, HDL, CHOLHDL, VLDL, LDLCALC, LDLDIRECT   Other studies Reviewed: Additional studies/ records that were reviewed today with results demonstrating: ***.   ASSESSMENT AND PLAN:  1. Abnormal stress test: 2. DOE: 3. PVCs: She has had papitations in  the past. 4. Prior ulcers: 5. HTN:   Current medicines are reviewed at length with the patient today.  The patient concerns regarding her medicines were addressed.  The following changes have been made:  No change***  Labs/ tests ordered today include: *** No orders of the defined types were placed in this encounter.   Recommend 150 minutes/week of aerobic exercise Low fat, low carb, high fiber diet recommended  Disposition:   FU in ***   Signed, Larae Grooms, MD  07/06/2017 11:57 PM    [facility] [place], [place], [place]  [address] Phone: [phone]; Fax: [phone]

## 2017-07-07 DIAGNOSIS — M109 Gout, unspecified: Secondary | ICD-10-CM | POA: Diagnosis not present

## 2017-07-07 DIAGNOSIS — M7989 Other specified soft tissue disorders: Secondary | ICD-10-CM | POA: Diagnosis not present

## 2017-07-07 DIAGNOSIS — M064 Inflammatory polyarthropathy: Secondary | ICD-10-CM | POA: Diagnosis not present

## 2017-07-07 DIAGNOSIS — M1990 Unspecified osteoarthritis, unspecified site: Secondary | ICD-10-CM | POA: Diagnosis not present

## 2017-07-08 ENCOUNTER — Ambulatory Visit: Payer: Medicare Other | Admitting: Interventional Cardiology

## 2017-07-23 DIAGNOSIS — R51 Headache: Secondary | ICD-10-CM | POA: Diagnosis not present

## 2017-07-23 DIAGNOSIS — M109 Gout, unspecified: Secondary | ICD-10-CM | POA: Diagnosis not present

## 2017-07-23 DIAGNOSIS — I10 Essential (primary) hypertension: Secondary | ICD-10-CM | POA: Diagnosis not present

## 2017-07-23 DIAGNOSIS — I5032 Chronic diastolic (congestive) heart failure: Secondary | ICD-10-CM | POA: Diagnosis not present

## 2017-07-23 DIAGNOSIS — Z0000 Encounter for general adult medical examination without abnormal findings: Secondary | ICD-10-CM | POA: Diagnosis not present

## 2017-07-23 DIAGNOSIS — R300 Dysuria: Secondary | ICD-10-CM | POA: Diagnosis not present

## 2017-07-23 DIAGNOSIS — Z1389 Encounter for screening for other disorder: Secondary | ICD-10-CM | POA: Diagnosis not present

## 2017-08-07 DIAGNOSIS — M109 Gout, unspecified: Secondary | ICD-10-CM | POA: Diagnosis not present

## 2017-08-07 DIAGNOSIS — M064 Inflammatory polyarthropathy: Secondary | ICD-10-CM | POA: Diagnosis not present

## 2017-08-07 DIAGNOSIS — I7381 Erythromelalgia: Secondary | ICD-10-CM | POA: Diagnosis not present

## 2017-08-07 DIAGNOSIS — M1990 Unspecified osteoarthritis, unspecified site: Secondary | ICD-10-CM | POA: Diagnosis not present

## 2017-08-07 DIAGNOSIS — M7989 Other specified soft tissue disorders: Secondary | ICD-10-CM | POA: Diagnosis not present

## 2017-08-13 DIAGNOSIS — L97522 Non-pressure chronic ulcer of other part of left foot with fat layer exposed: Secondary | ICD-10-CM | POA: Diagnosis not present

## 2017-08-18 DIAGNOSIS — H903 Sensorineural hearing loss, bilateral: Secondary | ICD-10-CM | POA: Diagnosis not present

## 2017-08-20 ENCOUNTER — Inpatient Hospital Stay (HOSPITAL_COMMUNITY): Payer: Medicare Other

## 2017-08-20 ENCOUNTER — Observation Stay (HOSPITAL_COMMUNITY)
Admission: EM | Admit: 2017-08-20 | Discharge: 2017-08-21 | Disposition: A | Discharge status: Home / Self Care | Payer: Medicare Other | Attending: Internal Medicine | Admitting: Internal Medicine

## 2017-08-20 ENCOUNTER — Other Ambulatory Visit: Payer: Self-pay

## 2017-08-20 ENCOUNTER — Encounter (HOSPITAL_COMMUNITY): Payer: Self-pay

## 2017-08-20 DIAGNOSIS — R1030 Lower abdominal pain, unspecified: Secondary | ICD-10-CM | POA: Diagnosis not present

## 2017-08-20 DIAGNOSIS — I10 Essential (primary) hypertension: Secondary | ICD-10-CM | POA: Diagnosis present

## 2017-08-20 DIAGNOSIS — K449 Diaphragmatic hernia without obstruction or gangrene: Secondary | ICD-10-CM | POA: Diagnosis not present

## 2017-08-20 DIAGNOSIS — N183 Chronic kidney disease, stage 3 unspecified: Secondary | ICD-10-CM | POA: Diagnosis present

## 2017-08-20 DIAGNOSIS — I509 Heart failure, unspecified: Secondary | ICD-10-CM | POA: Insufficient documentation

## 2017-08-20 DIAGNOSIS — R109 Unspecified abdominal pain: Secondary | ICD-10-CM | POA: Diagnosis not present

## 2017-08-20 DIAGNOSIS — K922 Gastrointestinal hemorrhage, unspecified: Principal | ICD-10-CM | POA: Diagnosis present

## 2017-08-20 DIAGNOSIS — Z79890 Hormone replacement therapy (postmenopausal): Secondary | ICD-10-CM | POA: Diagnosis not present

## 2017-08-20 DIAGNOSIS — R1084 Generalized abdominal pain: Secondary | ICD-10-CM | POA: Diagnosis not present

## 2017-08-20 DIAGNOSIS — Z8249 Family history of ischemic heart disease and other diseases of the circulatory system: Secondary | ICD-10-CM | POA: Insufficient documentation

## 2017-08-20 DIAGNOSIS — R7989 Other specified abnormal findings of blood chemistry: Secondary | ICD-10-CM | POA: Diagnosis not present

## 2017-08-20 DIAGNOSIS — I2510 Atherosclerotic heart disease of native coronary artery without angina pectoris: Secondary | ICD-10-CM | POA: Insufficient documentation

## 2017-08-20 DIAGNOSIS — Z79899 Other long term (current) drug therapy: Secondary | ICD-10-CM | POA: Diagnosis not present

## 2017-08-20 DIAGNOSIS — K5900 Constipation, unspecified: Secondary | ICD-10-CM | POA: Diagnosis not present

## 2017-08-20 DIAGNOSIS — Z880 Allergy status to penicillin: Secondary | ICD-10-CM | POA: Diagnosis not present

## 2017-08-20 DIAGNOSIS — I130 Hypertensive heart and chronic kidney disease with heart failure and stage 1 through stage 4 chronic kidney disease, or unspecified chronic kidney disease: Secondary | ICD-10-CM | POA: Diagnosis not present

## 2017-08-20 DIAGNOSIS — Z888 Allergy status to other drugs, medicaments and biological substances status: Secondary | ICD-10-CM | POA: Insufficient documentation

## 2017-08-20 DIAGNOSIS — R0789 Other chest pain: Secondary | ICD-10-CM | POA: Diagnosis not present

## 2017-08-20 DIAGNOSIS — R42 Dizziness and giddiness: Secondary | ICD-10-CM | POA: Diagnosis not present

## 2017-08-20 DIAGNOSIS — R001 Bradycardia, unspecified: Secondary | ICD-10-CM | POA: Diagnosis not present

## 2017-08-20 LAB — COMPREHENSIVE METABOLIC PANEL
ALK PHOS: 64 U/L (ref 38–126)
ALT: 9 U/L (ref 0–44)
AST: 19 U/L (ref 15–41)
Albumin: 3.5 g/dL (ref 3.5–5.0)
Anion gap: 10 (ref 5–15)
BUN: 18 mg/dL (ref 8–23)
CALCIUM: 9.2 mg/dL (ref 8.9–10.3)
CHLORIDE: 105 mmol/L (ref 98–111)
CO2: 23 mmol/L (ref 22–32)
Creatinine, Ser: 1.39 mg/dL — ABNORMAL HIGH (ref 0.44–1.00)
GFR calc Af Amer: 35 mL/min — ABNORMAL LOW (ref >60)
GFR calc non Af Amer: 30 mL/min — ABNORMAL LOW (ref >60)
Glucose, Bld: 88 mg/dL (ref 70–99)
Potassium: 3.9 mmol/L (ref 3.5–5.1)
Sodium: 138 mmol/L (ref 135–145)
Total Bilirubin: 0.7 mg/dL (ref 0.3–1.2)
Total Protein: 7.5 g/dL (ref 6.5–8.1)

## 2017-08-20 LAB — PROTIME-INR
INR: 1.03
Prothrombin Time: 13.4 seconds (ref 11.4–15.2)

## 2017-08-20 LAB — CBC
HCT: 40.3 % (ref 36.0–46.0)
Hemoglobin: 13.1 g/dL (ref 12.0–15.0)
MCH: 30.7 pg (ref 26.0–34.0)
MCHC: 32.5 g/dL (ref 30.0–36.0)
MCV: 94.4 fL (ref 78.0–100.0)
Platelets: 192 10*3/uL (ref 150–400)
RBC: 4.27 MIL/uL (ref 3.87–5.11)
RDW: 14.3 % (ref 11.5–15.5)
WBC: 6.2 10*3/uL (ref 4.0–10.5)

## 2017-08-20 LAB — TYPE AND SCREEN
ABO/RH(D): A NEG
Antibody Screen: NEGATIVE

## 2017-08-20 LAB — POC OCCULT BLOOD, ED: Fecal Occult Bld: NEGATIVE

## 2017-08-20 LAB — TROPONIN I: Troponin I: 0.05 ng/mL (ref <0.03)

## 2017-08-20 MED ORDER — ONDANSETRON HCL 4 MG PO TABS: 4.0000 mg | ORAL_TABLET | Freq: Four times a day (QID) | ORAL | Status: DC | PRN

## 2017-08-20 MED ORDER — CHLORHEXIDINE GLUCONATE 0.12 % MT SOLN
15.0000 mL | Freq: Two times a day (BID) | OROMUCOSAL | Status: DC
  Administered 2017-08-20 – 2017-08-21 (×2): 15 mL via OROMUCOSAL
  Filled 2017-08-20 (×2): qty 15

## 2017-08-20 MED ORDER — ISOSORBIDE MONONITRATE ER 30 MG PO TB24
30.0000 mg | ORAL_TABLET | Freq: Every day | ORAL | Status: DC
  Administered 2017-08-20 – 2017-08-21 (×2): 30 mg via ORAL
  Filled 2017-08-20 (×2): qty 1

## 2017-08-20 MED ORDER — SODIUM CHLORIDE 0.9 % IV BOLUS
500.0000 mL | Freq: Once | INTRAVENOUS | Status: AC
  Administered 2017-08-20: 500 mL via INTRAVENOUS

## 2017-08-20 MED ORDER — ORAL CARE MOUTH RINSE
15.0000 mL | Freq: Two times a day (BID) | OROMUCOSAL | Status: DC
  Administered 2017-08-20: 15 mL via OROMUCOSAL

## 2017-08-20 MED ORDER — ISOSORBIDE MONONITRATE ER 30 MG PO TB24: 30.0000 mg | ORAL_TABLET | Freq: Every day | ORAL | Status: DC

## 2017-08-20 MED ORDER — ACETAMINOPHEN 650 MG RE SUPP: 650.0000 mg | Freq: Four times a day (QID) | RECTAL | Status: DC | PRN

## 2017-08-20 MED ORDER — ONDANSETRON HCL 4 MG/2ML IJ SOLN: 4.0000 mg | Freq: Four times a day (QID) | INTRAMUSCULAR | Status: DC | PRN

## 2017-08-20 MED ORDER — METOPROLOL TARTRATE 25 MG PO TABS
25.0000 mg | ORAL_TABLET | Freq: Two times a day (BID) | ORAL | Status: DC
  Administered 2017-08-21: 25 mg via ORAL
  Filled 2017-08-20 (×3): qty 1

## 2017-08-20 MED ORDER — SODIUM CHLORIDE 0.9 % IV SOLN
INTRAVENOUS | Status: DC
  Administered 2017-08-20: 16:00:00 via INTRAVENOUS

## 2017-08-20 MED ORDER — FAMOTIDINE IN NACL 20-0.9 MG/50ML-% IV SOLN
20.0000 mg | Freq: Two times a day (BID) | INTRAVENOUS | Status: DC
  Administered 2017-08-20: 20 mg via INTRAVENOUS
  Filled 2017-08-20: qty 50

## 2017-08-20 MED ORDER — FAMOTIDINE IN NACL 20-0.9 MG/50ML-% IV SOLN
20.0000 mg | INTRAVENOUS | Status: DC
  Administered 2017-08-21: 20 mg via INTRAVENOUS
  Filled 2017-08-20: qty 50

## 2017-08-20 MED ORDER — ACETAMINOPHEN 325 MG PO TABS: 650.0000 mg | ORAL_TABLET | Freq: Four times a day (QID) | ORAL | Status: DC | PRN

## 2017-08-20 NOTE — ED Notes (Signed)
Pt is alert and oriented x 4 and is verbally responsive. Pt has some HOH and has hearing aide in place. Pt denies pain but did report having a soreness in her stomach earlier. Pt is escorted with her Nephew who is at bedside.

## 2017-08-20 NOTE — ED Triage Notes (Signed)
Pt states she noticed some dark red blood in her stool last night. This morning, it was lighter in color. Pt states stomach is sore, which started this morning. Pt states in the past, she had a similar issue, when they had to "tie her intestines with a rubber band".

## 2017-08-20 NOTE — ED Provider Notes (Signed)
[facility] DEPT Provider Note   CSN: [id number] Arrival date & time: 08/20/17  1101     History   Chief Complaint Chief Complaint  Patient presents with  . Rectal Bleeding    HPI [name] is a 84 y.o. female.  She presents with complaint of bleeding from her rectum.  She states she had nausea for the last couple days with a sore stomach.  Last night she had one episode of bowel movement that was associated with dark red blood in the bowl.  She had another movement last evening that was bright red.  Today she tried to move her bowels and did not pass any stool but there was pink.  She feels more fatigued and short of breath.  She also states she has intermittent chest pain that she has had before although not at the moment.  There is been no fever no cough.  She has a prior history of GI bleeds which sound or and was admitted and transfused during her last admission about a year ago.  She has not been using any NSAIDs.  She is not on anticoagulation.  The history is provided by the patient.  Rectal Bleeding  Quality:  Maroon and bright red Amount:  Moderate Timing:  Intermittent Chronicity:  Recurrent Context: defecation   Similar prior episodes: yes   Relieved by:  None tried Worsened by:  Nothing Ineffective treatments:  None tried Associated symptoms: abdominal pain and light-headedness   Associated symptoms: no epistaxis, no fever, no hematemesis, no loss of consciousness, no recent illness and no vomiting     Past Medical History:  Diagnosis Date  . Allergic rhinitis   . CAD (coronary artery disease)   . CHF (congestive heart failure) ([place])   . CKD (chronic kidney disease) stage 3, GFR 30-59 ml/min (HCC) 09/12/2014  . Diverticular disease   . Diverticulosis   . DJD (degenerative joint disease) of knee   . DJD (degenerative joint disease) of lumbar spine    scoliosis  . Eczema   . Esophageal erosions    from nsaid   . GI bleed    . Hormone replacement therapy (postmenopausal)   . Hyperglycemia   . Hyperlipidemia   . Hypertension   . Pneumonia    july 2009  . Pulmonary hypertension ([place])    a. 2D echo 03/2008 showed normal biventricular thickness, chamber size and systolic function, EF 50-53%, mild mitral regurgitation, aortic sclerosis, moderate pulmonary HTN, pulmonic regurgitation.  Marland Kitchen PVC's (premature ventricular contractions)   . Reactive depression (situational)   . Sciatica    right lower extremity  . Shingles    right thoracic 2008  . Vertigo     Patient Active Problem List   Diagnosis Date Noted  . DJD (degenerative joint disease) 08/09/2016  . GI bleed 08/08/2016  . Abnormal echocardiogram 06/14/2016  . PVC's (premature ventricular contractions)   . Bacteremia 09/18/2014  . Fever   . Febrile illness 09/12/2014  . Diverticulitis 09/12/2014  . CKD (chronic kidney disease) stage 3, GFR 30-59 ml/min (HCC) 09/12/2014  . HTN (hypertension) 09/12/2014  . Thrombocytopenia ([place]) 09/12/2014  . Abnormal cardiovascular stress test 01/13/2014  . Rectal bleed 08/10/2013  . Acute renal failure ([place]) 08/10/2013  . Essential hypertension, benign 01/08/2013  . DOE (dyspnea on exertion) 01/08/2013    Past Surgical History:  Procedure Laterality Date  . APPENDECTOMY    . BACK SURGERY    . BREAST LUMPECTOMY    .  FLEXIBLE SIGMOIDOSCOPY Left 08/13/2013   Procedure: FLEXIBLE SIGMOIDOSCOPY;  Surgeon: Arta Silence, MD;  Location: [facility] ENDOSCOPY;  Service: Endoscopy;  Laterality: Left;  . TOTAL ABDOMINAL HYSTERECTOMY       OB History   None      Home Medications    Prior to Admission medications   Medication Sig Start Date End Date Taking? Authorizing Provider  acetaminophen (TYLENOL) 500 MG tablet Take 500 mg every 6 (six) hours as needed by mouth.    [provider]  clotrimazole (LOTRIMIN) 1 % cream Apply 1 application topically as needed (foot -- infection/rash).     [provider]  furosemide (LASIX) 40 MG tablet Take 0.5 tablets by mouth. Take 0.5 tablet by mouth Monday, Wednesday, Friday 09/10/16   [provider]  hydrocortisone (ANUSOL-HC) 25 MG suppository Place 25 mg rectally 2 (two) times daily as needed for hemorrhoids or anal itching.    [provider]  isosorbide mononitrate (IMDUR) 30 MG 24 hr tablet Take 1 tablet (30 mg total) by mouth daily. 10/10/15   Jettie Booze, MD  loperamide (IMODIUM) 2 MG capsule Take 1 capsule (2 mg total) by mouth as needed for diarrhea or loose stools. 08/12/16   Domenic Polite, MD  meclizine (ANTIVERT) 25 MG tablet Take 25 mg by mouth 2 (two) times daily as needed for dizziness or nausea.     [provider]  metoprolol succinate (TOPROL-XL) 25 MG 24 hr tablet Take 25 mg by mouth 2 (two) times daily.    [provider]  Polyvinyl Alcohol-Povidone (REFRESH OP) Apply 1 drop to eye 2 (two) times daily.    [provider]  potassium chloride SA (K-DUR,KLOR-CON) 20 MEQ tablet Take 20 mEq by mouth 2 (two) times daily.    [provider]  PREMARIN 0.3 MG tablet Take 0.3 mg daily by mouth. 11/13/16   [provider]  Prenatal Vit-Fe Fumarate-FA (PRENATAL PO) Take 1 tablet by mouth daily.    [provider]    Family History Family History  Problem Relation Age of Onset  . Heart disease Father   . Heart attack Father   . Heart disease Mother   . Heart attack Mother   . Heart disease Daughter   . Heart disease Son   . Cancer Brother   . Heart disease Sister   . Heart attack Sister        X2  . Heart attack Brother   . Hypertension Brother   . Stroke Neg Hx     Social History Social History   Tobacco Use  . Smoking status: Never Smoker  . Smokeless tobacco: Never Used  Substance Use Topics  . Alcohol use: Yes    Comment: occasional cocktail  . Drug use: No     Allergies   Ace inhibitors; Streptomycin; Tramadol; Vesicare [solifenacin  succinate]; and Penicillins   Review of Systems Review of Systems  Constitutional: Positive for fatigue. Negative for fever.  HENT: Negative for nosebleeds and sore throat.   Eyes: Negative for visual disturbance.  Respiratory: Positive for shortness of breath.   Cardiovascular: Positive for chest pain.  Gastrointestinal: Positive for abdominal pain, anal bleeding, blood in stool, hematochezia and nausea. Negative for hematemesis and vomiting.  Genitourinary: Negative for dysuria and hematuria.  Musculoskeletal: Negative for back pain.  Skin: Negative for rash.  Neurological: Positive for light-headedness. Negative for loss of consciousness and headaches.     Physical Exam Updated Vital Signs BP Marland Kitchen)  144/75 (BP Location: Left Arm)   Pulse 77   Temp (!) 97.5 F (36.4 C) (Oral)   Resp 17   Ht 5\' 2"  (1.575 m)   Wt 59.4 kg (131 lb)   SpO2 99%   BMI 23.96 kg/m   Physical Exam  Constitutional: She appears well-developed and well-nourished. No distress.  HENT:  Head: Normocephalic and atraumatic.  Right Ear: External ear normal.  Left Ear: External ear normal.  Nose: Nose normal.  Mouth/Throat: Oropharynx is clear and moist.  Eyes: Conjunctivae are normal.  Neck: Neck supple.  Cardiovascular: Normal rate, regular rhythm, normal heart sounds and intact distal pulses.  No murmur heard. Pulmonary/Chest: Effort normal and breath sounds normal. No respiratory distress.  Abdominal: Soft. She exhibits no mass. There is no tenderness. There is no guarding.  Musculoskeletal: Normal range of motion. She exhibits no edema or deformity.  She has a postop shoe on her left foot from podiatry that has been working on a corn.   Neurological: She is alert.  Skin: Skin is warm and dry. Capillary refill takes less than 2 seconds.  Psychiatric: She has a normal mood and affect.  Nursing note and vitals reviewed.    ED Treatments / Results  Labs (all labs ordered are listed, but only  abnormal results are displayed) Labs Reviewed  COMPREHENSIVE METABOLIC PANEL - Abnormal; Notable for the following components:      Result Value   Creatinine, Ser 1.39 (*)    GFR calc non Af Amer 30 (*)    GFR calc Af Amer 35 (*)    All other components within normal limits  TROPONIN I - Abnormal; Notable for the following components:   Troponin I 0.05 (*)    All other components within normal limits  BASIC METABOLIC PANEL - Abnormal; Notable for the following components:   Creatinine, Ser 1.11 (*)    Calcium 8.7 (*)    GFR calc non Af Amer 40 (*)    GFR calc Af Amer 46 (*)    All other components within normal limits  CBC  PROTIME-INR  CBC  CBC  COMPREHENSIVE METABOLIC PANEL  POC OCCULT BLOOD, ED  TYPE AND SCREEN    EKG EKG Interpretation  Date/Time:  Wednesday August 20 2017 11:49:30 EDT Ventricular Rate:  63 PR Interval:    QRS Duration: 87 QT Interval:  409 QTC Calculation: 419 R Axis:   -27 Text Interpretation:  Sinus rhythm Borderline prolonged PR interval LVH with secondary repolarization abnormality Anterior Q waves, possibly due to LVH similar to prior 8/18 Confirmed by Aletta Edouard [phone]) on 08/20/2017 12:19:57 PM   Radiology Ct Abdomen Pelvis Wo Contrast  Result Date: 08/20/2017 CLINICAL DATA:  Abdominal pain, nausea and vomiting. EXAM: CT ABDOMEN AND PELVIS WITHOUT CONTRAST TECHNIQUE: Multidetector CT imaging of the abdomen and pelvis was performed following the standard protocol without IV contrast. COMPARISON:  08/08/2016, 08/10/2013 and 07/05/2013. FINDINGS: Lower chest: Large hiatal hernia. 4 mm left lower lobe nodule on image number 18 series 4, without significant change since 08/10/2013. Small amount of linear scarring or atelectasis at the right lung base. Hepatobiliary: The gallbladder remains prominent with no wall thickening or pericholecystic fluid. Unremarkable liver. Pancreas: Unremarkable. No pancreatic ductal dilatation or surrounding  inflammatory changes. Spleen: Normal in size without focal abnormality. Adrenals/Urinary Tract: Adrenal glands are unremarkable. Kidneys are normal, without renal calculi, focal lesion, or hydronephrosis. Bladder is unremarkable. Stomach/Bowel: Large hiatal hernia. Extensive colonic diverticulosis without evidence of diverticulitis. No evidence  of appendicitis, surgically absent by history. Unremarkable small bowel. Vascular/Lymphatic: Mild atheromatous arterial calcifications without aneurysm. Mildly prominent retroperitoneal and pelvic lymph nodes with no abnormally enlarged lymph nodes visualized and without significant change. Reproductive: Status post hysterectomy. No adnexal masses. Other: Small umbilical hernia containing fat and fluid without significant change. Musculoskeletal: Marked lumbar and lower thoracic spine degenerative changes with little change. Stable bone fusion at the T12 through L2 levels. IMPRESSION: 1. No acute abnormality. 2. Large hiatal hernia. 3. Stable 4 mm left lower lobe nodule. The long-term stability is compatible with a benign process. 4. Extensive colonic diverticulosis. Electronically Signed   By: Claudie Revering M.D.   On: 08/20/2017 17:30    Procedures Procedures (including critical care time)  Medications Ordered in ED Medications  sodium chloride 0.9 % bolus 500 mL (has no administration in time range)  famotidine (PEPCID) IVPB 20 mg premix (has no administration in time range)     Initial Impression / Assessment and Plan / ED Course  I have reviewed the triage vital signs and the nursing notes.  Pertinent labs & imaging results that were available during my care of the patient were reviewed by me and considered in my medical decision making (see chart for details).  Clinical Course as of Aug 22 1730  Wed Aug 20, 2017  1335 Rectal exam done with nurse [facility] as chaperone.  Empty vault normal sphincter tone with no obvious masses.  Guaiac pending.   [MB]    5115 84 year old female with history of lower GI bleed here with with 3 episodes of rectal bleeding.  She is not anticoagulated has been hemodynamically stable and her hematocrit is okay.  She still is symptomatic with some shortness of breath.  Her troponin is mildly elevated but on review her prior troponins are always slightly elevated.  I will review this with GI but my expectation is that he likely will not intervene unless she becomes actively bleeding again.  My bias would be that she gets admitted for some serial crits secondary to her age and frailty.   [MB]  5868 Discussed with GI who felt they would be unlikely to intervene but will see in consult.  Hospitalist has been paged for admission.   [MB]    Clinical Course User Index [MB] Hayden Rasmussen, MD    Final Clinical Impressions(s) / ED Diagnoses   Final diagnoses:  Lower GI bleed    ED Discharge Orders    None       Hayden Rasmussen, MD 08/21/17 1733

## 2017-08-20 NOTE — Consult Note (Signed)
Referring Provider: Dr. Wynelle Cleveland Primary Care Physician:  Josetta Huddle, MD Primary Gastroenterologist:  Althia Forts  Reason for Consultation:  GI bleed  HPI: [name] is a 84 y.o. female with red blood per rectum that happens about every 2-3 months with associated lower abdominal pain. +N without vomiting. Has lost 32 pounds unintentionally in the last year. Denies black stools. Last colonoscopy over 20 years ago and records not known. Hgb 13.  Past Medical History:  Diagnosis Date  . Allergic rhinitis   . CAD (coronary artery disease)   . CHF (congestive heart failure) ([place])   . CKD (chronic kidney disease) stage 3, GFR 30-59 ml/min (HCC) 09/12/2014  . Diverticular disease   . Diverticulosis   . DJD (degenerative joint disease) of knee   . DJD (degenerative joint disease) of lumbar spine    scoliosis  . Eczema   . Esophageal erosions    from nsaid   . GI bleed   . Hormone replacement therapy (postmenopausal)   . Hyperglycemia   . Hyperlipidemia   . Hypertension   . Pneumonia    july 2009  . Pulmonary hypertension ([place])    a. 2D echo 03/2008 showed normal biventricular thickness, chamber size and systolic function, EF 17-51%, mild mitral regurgitation, aortic sclerosis, moderate pulmonary HTN, pulmonic regurgitation.  Marland Kitchen PVC's (premature ventricular contractions)   . Reactive depression (situational)   . Sciatica    right lower extremity  . Shingles    right thoracic 2008  . Vertigo     Past Surgical History:  Procedure Laterality Date  . APPENDECTOMY    . BACK SURGERY    . BREAST LUMPECTOMY    . FLEXIBLE SIGMOIDOSCOPY Left 08/13/2013   Procedure: FLEXIBLE SIGMOIDOSCOPY;  Surgeon: Arta Silence, MD;  Location: [facility] END[facility];  Service: Endoscopy;  Laterality: Left;  . TOTAL ABDOMINAL HYSTERECTOMY      Prior to Admission medications   Medication Sig Start Date End Date Taking? Authorizing Provider  acetaminophen (TYLENOL) 500 MG tablet Take 500 mg every 6 (six)  hours as needed by mouth.   Yes [provider]  clotrimazole (LOTRIMIN) 1 % cream Apply 1 application topically as needed (foot -- infection/rash).    Yes [provider]  furosemide (LASIX) 40 MG tablet Take 0.5 tablets by mouth. Take 0.5 tablet by mouth Monday, Wednesday, Friday 09/10/16  Yes [provider]  isosorbide mononitrate (IMDUR) 30 MG 24 hr tablet Take 1 tablet (30 mg total) by mouth daily. 10/10/15  Yes Jettie Booze, MD  loperamide (IMODIUM) 2 MG capsule Take 1 capsule (2 mg total) by mouth as needed for diarrhea or loose stools. 08/12/16  Yes Domenic Polite, MD  meclizine (ANTIVERT) 25 MG tablet Take 25 mg by mouth 2 (two) times daily as needed for dizziness or nausea.    Yes [provider]  metoprolol succinate (TOPROL-XL) 25 MG 24 hr tablet Take 25 mg by mouth 2 (two) times daily.   Yes [provider]  Polyvinyl Alcohol-Povidone (REFRESH OP) Apply 1 drop to eye 2 (two) times daily.   Yes [provider]  potassium chloride SA (K-DUR,KLOR-CON) 20 MEQ tablet Take 20 mEq by mouth 2 (two) times daily.   Yes [provider]  PREMARIN 0.3 MG tablet Take 0.3 mg daily by mouth. 11/13/16  Yes [provider]  Prenatal Vit-Fe Fumarate-FA (PRENATAL PO) Take 1 tablet by mouth daily.   Yes [provider]  sulfamethoxazole-trimethoprim (BACTRIM DS,SEPTRA DS) 800-160 MG tablet Take  1 tablet by mouth daily. 08/13/17  Yes [provider]    Scheduled Meds: . isosorbide mononitrate  30 mg Oral Daily  . metoprolol tartrate  25 mg Oral BID   Continuous Infusions: . sodium chloride    . [START ON 08/21/2017] famotidine (PEPCID) IV     PRN Meds:.acetaminophen **OR** acetaminophen, ondansetron **OR** ondansetron (ZOFRAN) IV  Allergies as of 08/20/2017 - Review Complete 08/20/2017  Allergen Reaction Noted  . Ace inhibitors Hives and Itching 12/25/2010  . Streptomycin Other (See Comments) and Itching  12/25/2010  . Tramadol Other (See Comments) 12/25/2010  . Vesicare [solifenacin succinate] Itching 08/04/2016  . Penicillins Rash 12/25/2010    Family History  Problem Relation Age of Onset  . Heart disease Father   . Heart attack Father   . Heart disease Mother   . Heart attack Mother   . Heart disease Daughter   . Heart disease Son   . Cancer Brother   . Heart disease Sister   . Heart attack Sister        X2  . Heart attack Brother   . Hypertension Brother   . Stroke Neg Hx     Social History   Socioeconomic History  . Marital status: Widowed    Spouse name: Not on file  . Number of children: 0  . Years of education: Not on file  . Highest education level: Not on file  Occupational History  . Occupation: retired Education officer, museum  Social Needs  . Financial resource strain: Not on file  . Food insecurity:    Worry: Not on file    Inability: Not on file  . Transportation needs:    Medical: Not on file    Non-medical: Not on file  Tobacco Use  . Smoking status: Never Smoker  . Smokeless tobacco: Never Used  Substance and Sexual Activity  . Alcohol use: Yes    Comment: occasional cocktail  . Drug use: No  . Sexual activity: Never  Lifestyle  . Physical activity:    Days per week: Not on file    Minutes per session: Not on file  . Stress: Not on file  Relationships  . Social connections:    Talks on phone: Not on file    Gets together: Not on file    Attends religious service: Not on file    Active member of club or organization: Not on file    Attends meetings of clubs or organizations: Not on file    Relationship status: Not on file  . Intimate partner violence:    Fear of current or ex partner: Not on file    Emotionally abused: Not on file    Physically abused: Not on file    Forced sexual activity: Not on file  Other Topics Concern  . Not on file  Social History Narrative   Pt is widowed and lives alone.          Review of Systems: All  negative except as stated above in HPI.  Physical Exam: Vital signs: Vitals:   08/20/17 1205 08/20/17 1606  BP: 132/60 136/66  Pulse: 61 (!) 56  Resp: 18   Temp: 97.7 F (36.5 C)   SpO2: 99% 99%     General:  Lethargic, elderly, frail, thin, pleasant, no acute distress Head: normocephalic, atraumatic Eyes: anicteric sclera ENT: oropharynx clear Neck: supple, nontender Lungs:  Clear throughout to auscultation.   No wheezes, crackles, or rhonchi. No acute distress. Heart:  Regular rate and rhythm; no murmurs, clicks, rubs,  or gallops. Abdomen: lower abdominal tenderness with guarding, +mild distention, +BS  Rectal:  Deferred Ext: no edema  GI:  Lab Results: Recent Labs    08/20/17 1204  WBC 6.2  HGB 13.1  HCT 40.3  PLT 192   BMET Recent Labs    08/20/17 1204  NA 138  K 3.9  CL 105  CO2 23  GLUCOSE 88  BUN 18  CREATININE 1.39*  CALCIUM 9.2   LFT Recent Labs    08/20/17 1204  PROT 7.5  ALBUMIN 3.5  AST 19  ALT 9  ALKPHOS 64  BILITOT 0.7   PT/INR No results for input(s): LABPROT, INR in the last 72 hours.   Studies/Results: No results found.  Impression/Plan: 84 yo with rectal bleeding, abdominal pain, and weight loss concerning for colon malignancy. Diverticular bleeding vs hemorrhoids also possible as source for bleeding but weight loss and abdominal pain needs to be worked up with a CT scan and will do without IV contrast due to elevated serum Cr. Will hold off on a colonoscopy unless concerning finding seen on CT scan. Clear liquid diet. Supportive care.    LOS: 0 days   [place] C.  08/20/2017, 4:11 PM  Questions please call [phone]

## 2017-08-20 NOTE — ED Notes (Signed)
CRITICAL VALUE STICKER  CRITICAL VALUE: Troponin 0.05 RECEIVER (on-site recipient of call): C. Burdette Forehand, [place] NOTIFIED: [phone]    08/20/2017  MESSENGER (representative from lab): Tammy   MD NOTIFIED:  Dr. Melina Copa  TIME OF NOTIFICATION: 2060  RESPONSE: awaiting orders.

## 2017-08-20 NOTE — H&P (Addendum)
History and Physical    [name] KGM:[id number] DOB: 08-20-1916 DOA: 08/20/2017    PCP: Josetta Huddle, MD  Patient coming from: home  Chief Complaint: blood per rectum  HPI: [name] is a 84 y.o. female with medical history of CHF, CKD 3, CAD, who presents for rectal bleeding.  She was last admitted from 7/12-7/16 2018 for gross melena and endoscopy was deferred due to her age.  She had her first episode of bleeding about 7 PM yesterday, stool was maroon. Her second episode was at 3 AM and her next around 7 AM and these were bright red blood. She has felt abdominal pain across her lower abdomen which has been crampy and resolves after the bleeding episode. She has occasional heart burn and sometimes she feels she is very nauseated when she wakes up. She has lost 35 lb in 1 yr due to lack of appetite. She has not been lightheaded from the bleeding but does have occasional vertigo.   She avoids NSAIDs are recommended by her doctors and takes her medications appropriately.   ED Course: Hb normal at 13.1, no symptoms of dizziness- Troponin 0.05 - hemo occult negative   Review of Systems:  She has occasional chest pain when she exerts herself too much.  She has mild arthritis in her hands for which she takes Tylenol.  All other systems reviewed and apart from HPI, are negative.  Past Medical History:  Diagnosis Date  . Allergic rhinitis   . CAD (coronary artery disease)   . CHF (congestive heart failure) ([place])   . CKD (chronic kidney disease) stage 3, GFR 30-59 ml/min (HCC) 09/12/2014  . Diverticular disease   . Diverticulosis   . DJD (degenerative joint disease) of knee   . DJD (degenerative joint disease) of lumbar spine    scoliosis  . Eczema   . Esophageal erosions    from nsaid   . GI bleed   . Hormone replacement therapy (postmenopausal)   . Hyperglycemia   . Hyperlipidemia   . Hypertension   . Pneumonia    july 2009  . Pulmonary hypertension ([place])    a. 2D echo 03/2008 showed normal biventricular thickness, chamber size and systolic function, EF 64-40%, mild mitral regurgitation, aortic sclerosis, moderate pulmonary HTN, pulmonic regurgitation.  Marland Kitchen PVC's (premature ventricular contractions)   . Reactive depression (situational)   . Sciatica    right lower extremity  . Shingles    right thoracic 2008  . Vertigo     Past Surgical History:  Procedure Laterality Date  . APPENDECTOMY    . BACK SURGERY    . BREAST LUMPECTOMY    . FLEXIBLE SIGMOIDOSCOPY Left 08/13/2013   Procedure: FLEXIBLE SIGMOIDOSCOPY;  Surgeon: Arta Silence, MD;  Location: [facility];  Service: Endoscopy;  Laterality: Left;  . TOTAL ABDOMINAL HYSTERECTOMY      Social History:   reports that she has never smoked. She has never used smokeless tobacco. She reports that she drinks alcohol. She reports that she does not use drugs.  Allergies  Allergen Reactions  . Ace Inhibitors Hives and Itching  . Streptomycin Other (See Comments) and Itching    Pruritus Pruritus   . Tramadol Other (See Comments)    unknown  . Vesicare [Solifenacin Succinate] Itching  . Penicillins Rash    Pruritic rash Has patient had a PCN reaction causing immediate rash, facial/tongue/throat swelling, SOB or lightheadedness with hypotension: unknown Has patient had a PCN reaction causing severe rash involving  mucus membranes or skin necrosis: unknown Has patient had a PCN reaction that required hospitalization:unknown Has patient had a PCN reaction occurring within the last 10 years: yes If all of the above answers are "NO", then may proceed with Cephalosporin use.     Family History  Problem Relation Age of Onset  . Heart disease Father   . Heart attack Father   . Heart disease Mother   . Heart attack Mother   . Heart disease Daughter   . Heart disease Son   . Cancer Brother   . Heart disease Sister   . Heart attack Sister        X2  . Heart attack Brother   . Hypertension  Brother   . Stroke Neg Hx      Prior to Admission medications   Medication Sig Start Date End Date Taking? Authorizing Provider  acetaminophen (TYLENOL) 500 MG tablet Take 500 mg every 6 (six) hours as needed by mouth.   Yes [provider]  clotrimazole (LOTRIMIN) 1 % cream Apply 1 application topically as needed (foot -- infection/rash).    Yes [provider]  furosemide (LASIX) 40 MG tablet Take 0.5 tablets by mouth. Take 0.5 tablet by mouth Monday, Wednesday, Friday 09/10/16  Yes [provider]  isosorbide mononitrate (IMDUR) 30 MG 24 hr tablet Take 1 tablet (30 mg total) by mouth daily. 10/10/15  Yes Jettie Booze, MD  loperamide (IMODIUM) 2 MG capsule Take 1 capsule (2 mg total) by mouth as needed for diarrhea or loose stools. 08/12/16  Yes Domenic Polite, MD  meclizine (ANTIVERT) 25 MG tablet Take 25 mg by mouth 2 (two) times daily as needed for dizziness or nausea.    Yes [provider]  metoprolol succinate (TOPROL-XL) 25 MG 24 hr tablet Take 25 mg by mouth 2 (two) times daily.   Yes [provider]  Polyvinyl Alcohol-Povidone (REFRESH OP) Apply 1 drop to eye 2 (two) times daily.   Yes [provider]  potassium chloride SA (K-DUR,KLOR-CON) 20 MEQ tablet Take 20 mEq by mouth 2 (two) times daily.   Yes [provider]  PREMARIN 0.3 MG tablet Take 0.3 mg daily by mouth. 11/13/16  Yes [provider]  Prenatal Vit-Fe Fumarate-FA (PRENATAL PO) Take 1 tablet by mouth daily.   Yes [provider]  sulfamethoxazole-trimethoprim (BACTRIM DS,SEPTRA DS) 800-160 MG tablet Take 1 tablet by mouth daily. 08/13/17  Yes [provider]    Physical Exam: Wt Readings from Last 3 Encounters:  08/20/17 59.4 kg (131 lb)  12/12/16 61.8 kg (136 lb 3.2 oz)  09/05/16 68 kg (150 lb)   Vitals:   08/20/17 1107 08/20/17 1205  BP: (!) 144/75 132/60  Pulse: 77 61  Resp: 17 18  Temp: (!) 97.5 F (36.4 C) 97.7  F (36.5 C)  TempSrc: Oral Oral  SpO2: 99% 99%  Weight: 59.4 kg (131 lb)   Height: 5\' 2"  (1.575 m)       Constitutional: NAD, calm, comfortable Eyes: PERTLA, lids and conjunctivae normal ENMT: Mucous membranes are moist. Posterior pharynx clear of any exudate or lesions. Normal dentition. - has hearing aids but hearing is still a little poor Neck: normal, supple, no masses, no thyromegaly Respiratory: clear to auscultation bilaterally, no wheezing, no crackles. Normal respiratory effort. No accessory muscle use.  Cardiovascular: S1 & S2 heard, regular rate and rhythm, no murmurs / rubs / gallops. No extremity edema. 2+ pedal pulses. No carotid bruits.  Abdomen: No distension,  Tenderness across lower abdomen, no masses palpated. No hepatosplenomegaly. Bowel sounds normal.  Musculoskeletal: no clubbing / cyanosis. No joint deformity upper and lower extremities. Good ROM, no contractures. Normal muscle tone.  Skin: no rashes, lesions, ulcers. No induration Neurologic: CN 2-12 grossly intact. Sensation intact, DTR normal. Strength 5/5 in all 4 limbs.  Psychiatric: Normal judgment and insight. Alert and oriented x 3. Normal mood.     Labs on Admission: I have personally reviewed following labs and imaging studies  CBC: Recent Labs  Lab 08/20/17 1204  WBC 6.2  HGB 13.1  HCT 40.3  MCV 94.4  PLT 836   Basic Metabolic Panel: Recent Labs  Lab 08/20/17 1204  NA 138  K 3.9  CL 105  CO2 23  GLUCOSE 88  BUN 18  CREATININE 1.39*  CALCIUM 9.2   GFR: Estimated Creatinine Clearance: 17.9 mL/min (A) (by C-G formula based on SCr of 1.39 mg/dL (H)). Liver Function Tests: Recent Labs  Lab 08/20/17 1204  AST 19  ALT 9  ALKPHOS 64  BILITOT 0.7  PROT 7.5  ALBUMIN 3.5   No results for input(s): LIPASE, AMYLASE in the last 168 hours. No results for input(s): AMMONIA in the last 168 hours. Coagulation Profile: No results for input(s): INR, PROTIME in the last 168 hours. Cardiac  Enzymes: Recent Labs  Lab 08/20/17 1201  TROPONINI 0.05*   BNP (last 3 results) No results for input(s): PROBNP in the last 8760 hours. HbA1C: No results for input(s): HGBA1C in the last 72 hours. CBG: No results for input(s): GLUCAP in the last 168 hours. Lipid Profile: No results for input(s): CHOL, HDL, LDLCALC, TRIG, CHOLHDL, LDLDIRECT in the last 72 hours. Thyroid Function Tests: No results for input(s): TSH, T4TOTAL, FREET4, T3FREE, THYROIDAB in the last 72 hours. Anemia Panel: No results for input(s): VITAMINB12, FOLATE, FERRITIN, TIBC, IRON, RETICCTPCT in the last 72 hours. Urine analysis:    Component Value Date/Time   COLORURINE YELLOW 08/11/2016 0840   APPEARANCEUR HAZY (A) 08/11/2016 0840   LABSPEC 1.016 08/11/2016 0840   PHURINE 5.0 08/11/2016 0840   GLUCOSEU NEGATIVE 08/11/2016 0840   HGBUR SMALL (A) 08/11/2016 0840   BILIRUBINUR NEGATIVE 08/11/2016 0840   KETONESUR NEGATIVE 08/11/2016 0840   PROTEINUR NEGATIVE 08/11/2016 0840   UROBILINOGEN 0.2 09/18/2014 1600   NITRITE NEGATIVE 08/11/2016 0840   LEUKOCYTESUR TRACE (A) 08/11/2016 0840   Sepsis Labs: @LABRCNTIP (procalcitonin:4,lacticidven:4) )No results found for this or any previous visit (from the past 240 hour(s)).   Radiological Exams on Admission: No results found.  EKG: Independently reviewed. Sinus rhythm at 63 bpm  Assessment/Plan Principal Problem:   GI bleed - ? Lower GI bleed- hemoccult in ED negative - quite tender across her lower abdomen maybe due to cramping - check xray abdomen- GFR is only 35, will hold off on getting a CT today but if Cr improves and pain does not resolve, may need CT tomorrow simply to dermine the cause- her weight loss,  nausea and loss of appetite is concerning for underlying malignancy - not on any blood thinners - no need to transfuse yet - [facility] called by ED - at her age, they do not recommend any procedures and therefore management will be conservative -  clear liquids, slow IVF ordered  Active Problems: Mild Bradycardia and Essential hypertension  - Metoprolol 25 mg daily and Imdur with holding parameters - hold lasix  Mildly elevated troponin -  No chest pain- no need to  follow up    CKD (chronic kidney disease) stage 3, GFR 30-59 ml/min (HCC)  - follow   DVT prophylaxis: sCDs  Code Status: SNF  Family Communication:   Disposition Plan: follow for bleeding   Consults called: ED discussed with [facility]   Admission status: Inpatient   Debbe Odea MD [facility] Pager: [email or web address] Password [email or web address] 7PM-7AM, please contact night-coverage   08/20/2017, 3:42 PM

## 2017-08-20 NOTE — ED Notes (Signed)
Pt had drawn for labs:  Gold Blue Lavender Lt green dk green x2 Pink x2

## 2017-08-20 NOTE — ED Notes (Signed)
ED TO INPATIENT HANDOFF REPORT  Name/Age/Gender [name] 84 y.o. female  Code Status Code Status History    Date Active Date Inactive Code Status Order ID Comments User Context   08/08/2016 1804 08/12/2016 2007 Full Code [id number]  Tawni Millers, MD ED   09/18/2014 1301 09/20/2014 2137 Full Code [id number]  Theodis Blaze, MD Inpatient   09/12/2014 0730 09/14/2014 1422 DNR [id number]  Caren Griffins, MD ED   08/10/2013 1750 08/14/2013 1535 Full Code [id number]  Robbie Lis, MD Inpatient      Home/SNF/Other Home  Chief Complaint rectal bleeding   Level of Care/Admitting Diagnosis ED Disposition    ED Disposition Condition [facility] Area: [facility] [[id number]]  Level of Care: Med-Surg [16]  Diagnosis: GI bleed [939030]  Admitting Physician: [place], [place]  Attending Physician: Debbe Odea [[id number]]  Estimated length of stay: past midnight tomorrow  Certification:: I certify this patient will need inpatient services for at least 2 midnights  PT Class (Do Not Modify): Inpatient [101]  PT Acc Code (Do Not Modify): Private [1]       Medical History Past Medical History:  Diagnosis Date  . Allergic rhinitis   . CAD (coronary artery disease)   . CHF (congestive heart failure) ([place])   . CKD (chronic kidney disease) stage 3, GFR 30-59 ml/min (HCC) 09/12/2014  . Diverticular disease   . Diverticulosis   . DJD (degenerative joint disease) of knee   . DJD (degenerative joint disease) of lumbar spine    scoliosis  . Eczema   . Esophageal erosions    from nsaid   . GI bleed   . Hormone replacement therapy (postmenopausal)   . Hyperglycemia   . Hyperlipidemia   . Hypertension   . Pneumonia    july 2009  . Pulmonary hypertension ([place])    a. 2D echo 03/2008 showed normal biventricular thickness, chamber size and systolic function, EF 09-23%, mild mitral regurgitation, aortic sclerosis, moderate pulmonary HTN, pulmonic  regurgitation.  Marland Kitchen PVC's (premature ventricular contractions)   . Reactive depression (situational)   . Sciatica    right lower extremity  . Shingles    right thoracic 2008  . Vertigo     Allergies Allergies  Allergen Reactions  . Ace Inhibitors Hives and Itching  . Streptomycin Other (See Comments) and Itching    Pruritus Pruritus   . Tramadol Other (See Comments)    unknown  . Vesicare [Solifenacin Succinate] Itching  . Penicillins Rash    Pruritic rash Has patient had a PCN reaction causing immediate rash, facial/tongue/throat swelling, SOB or lightheadedness with hypotension: unknown Has patient had a PCN reaction causing severe rash involving mucus membranes or skin necrosis: unknown Has patient had a PCN reaction that required hospitalization:unknown Has patient had a PCN reaction occurring within the last 10 years: yes If all of the above answers are "NO", then may proceed with Cephalosporin use.     IV Location/Drains/Wounds Patient Lines/Drains/Airways Status   Active Line/Drains/Airways    Name:   Placement date:   Placement time:   Site:   Days:   Peripheral IV 08/20/17 Left Antecubital   08/20/17    1230    Antecubital   less than 1          Labs/Imaging Results for orders placed or performed during the hospital encounter of 08/20/17 (from the past 48 hour(s))  Type and screen [facility]  Status: None   Collection Time: 08/20/17 12:01 PM  Result Value Ref Range   ABO/RH(D) A NEG    Antibody Screen NEG    Sample Expiration      08/23/2017 Performed at [facility], [place] [address] [place], [place] [address]   Troponin I     Status: Abnormal   Collection Time: 08/20/17 12:01 PM  Result Value Ref Range   Troponin I 0.05 (HH) <0.03 ng/mL    Comment: CRITICAL RESULT CALLED TO, READ BACK BY AND VERIFIED WITH: [facility]. RN AT 1324 08/20/17 MULLINS,T Performed at [facility], [place] [address] [place], [place] [address]   Comprehensive metabolic panel     Status: Abnormal   Collection Time: 08/20/17 12:04 PM  Result Value Ref Range   Sodium 138 135 - 145 mmol/L   Potassium 3.9 3.5 - 5.1 mmol/L   Chloride 105 98 - 111 mmol/L   CO2 23 22 - 32 mmol/L   Glucose, Bld 88 70 - 99 mg/dL   BUN 18 8 - 23 mg/dL   Creatinine, Ser 1.39 (H) 0.44 - 1.00 mg/dL   Calcium 9.2 8.9 - 10.3 mg/dL   Total Protein 7.5 6.5 - 8.1 g/dL   Albumin 3.5 3.5 - 5.0 g/dL   AST 19 15 - 41 U/L   ALT 9 0 - 44 U/L   Alkaline Phosphatase 64 38 - 126 U/L   Total Bilirubin 0.7 0.3 - 1.2 mg/dL   GFR calc non Af Amer 30 (L) >60 mL/min   GFR calc Af Amer 35 (L) >60 mL/min    Comment: (NOTE) The eGFR has been calculated using the CKD EPI equation. This calculation has not been validated in all clinical situations. eGFR's persistently <60 mL/min signify possible Chronic Kidney Disease.    Anion gap 10 5 - 15    Comment: Performed at [facility], [place] [address] [place], [place] [address]  CBC     Status: None   Collection Time: 08/20/17 12:04 PM  Result Value Ref Range   WBC 6.2 4.0 - 10.5 K/uL   RBC 4.27 3.87 - 5.11 MIL/uL   Hemoglobin 13.1 12.0 - 15.0 g/dL   HCT 40.3 36.0 - 46.0 %   MCV 94.4 78.0 - 100.0 fL   MCH 30.7 26.0 - 34.0 pg   MCHC 32.5 30.0 - 36.0 g/dL   RDW 14.3 11.5 - 15.5 %   Platelets 192 150 - 400 K/uL    Comment: Performed at [facility], [place] [address] [place], [place] [address]  POC occult blood, ED     Status: None   Collection Time: 08/20/17  1:38 PM  Result Value Ref Range   Fecal Occult Bld NEGATIVE NEGATIVE   No results found.  Pending Labs Unresulted Labs (From admission, onward)   Start     Ordered   08/20/17 1229  Protime-INR  Once,   STAT     08/20/17 1229      Vitals/Pain Today's Vitals   08/20/17 1107 08/20/17 1112 08/20/17 1205  BP: (!) 144/75  132/60  Pulse: 77  61  Resp: 17  18  Temp: (!) 97.5 F (36.4 C)  97.7 F  (36.5 C)  TempSrc: Oral  Oral  SpO2: 99%  99%  Weight: 131 lb (59.4 kg)    Height: _0  (1.575 m)    PainSc:  0-No pain     Isolation Precautions No active isolations  Medications  Medications  famotidine (PEPCID) IVPB 20 mg premix (20 mg Intravenous New Bag/Given 08/20/17 1225)  sodium chloride 0.9 % bolus 500 mL (500 mLs Intravenous New Bag/Given 08/20/17 1225)    Mobility walks

## 2017-08-21 DIAGNOSIS — K922 Gastrointestinal hemorrhage, unspecified: Secondary | ICD-10-CM

## 2017-08-21 DIAGNOSIS — I10 Essential (primary) hypertension: Secondary | ICD-10-CM

## 2017-08-21 DIAGNOSIS — N183 Chronic kidney disease, stage 3 (moderate): Secondary | ICD-10-CM | POA: Diagnosis not present

## 2017-08-21 DIAGNOSIS — R1084 Generalized abdominal pain: Secondary | ICD-10-CM | POA: Diagnosis not present

## 2017-08-21 LAB — BASIC METABOLIC PANEL
ANION GAP: 7 (ref 5–15)
BUN: 13 mg/dL (ref 8–23)
CALCIUM: 8.7 mg/dL — AB (ref 8.9–10.3)
CO2: 22 mmol/L (ref 22–32)
CREATININE: 1.11 mg/dL — AB (ref 0.44–1.00)
Chloride: 111 mmol/L (ref 98–111)
GFR calc Af Amer: 46 mL/min — ABNORMAL LOW (ref >60)
GFR calc non Af Amer: 40 mL/min — ABNORMAL LOW (ref >60)
Glucose, Bld: 84 mg/dL (ref 70–99)
Potassium: 4.1 mmol/L (ref 3.5–5.1)
Sodium: 140 mmol/L (ref 135–145)

## 2017-08-21 LAB — CBC
HCT: 38.4 % (ref 36.0–46.0)
HEMOGLOBIN: 12.1 g/dL (ref 12.0–15.0)
MCH: 30.3 pg (ref 26.0–34.0)
MCHC: 31.5 g/dL (ref 30.0–36.0)
MCV: 96.2 fL (ref 78.0–100.0)
Platelets: 179 10*3/uL (ref 150–400)
RBC: 3.99 MIL/uL (ref 3.87–5.11)
RDW: 14.4 % (ref 11.5–15.5)
WBC: 5.1 10*3/uL (ref 4.0–10.5)

## 2017-08-21 MED ORDER — FUROSEMIDE 20 MG PO TABS
20.0000 mg | ORAL_TABLET | Freq: Every day | ORAL | Status: DC
  Administered 2017-08-21: 20 mg via ORAL
  Filled 2017-08-21: qty 1

## 2017-08-21 MED ORDER — POLYETHYLENE GLYCOL 3350 17 G PO PACK: 17.0000 g | PACK | Freq: Every day | ORAL | 0 refills | Status: DC | PRN

## 2017-08-21 NOTE — Progress Notes (Signed)
Madalyne Amparo [place] to be D/C'd Home per MD order.  Discussed prescriptions and follow up appointments with the patient. Prescriptions given to patient, medication list explained in detail. Pt verbalized understanding.  Allergies as of 08/21/2017      Reactions   Ace Inhibitors Hives, Itching   Streptomycin Other (See Comments), Itching   Pruritus Pruritus   Tramadol Other (See Comments)   unknown   Vesicare [solifenacin Succinate] Itching   Penicillins Rash   Pruritic rash Has patient had a PCN reaction causing immediate rash, facial/tongue/throat swelling, SOB or lightheadedness with hypotension: unknown Has patient had a PCN reaction causing severe rash involving mucus membranes or skin necrosis: unknown Has patient had a PCN reaction that required hospitalization:unknown Has patient had a PCN reaction occurring within the last 10 years: yes If all of the above answers are "NO", then may proceed with Cephalosporin use.      Medication List    STOP taking these medications   sulfamethoxazole-trimethoprim 800-160 MG tablet Commonly known as:  BACTRIM DS,SEPTRA DS     TAKE these medications   acetaminophen 500 MG tablet Commonly known as:  TYLENOL Take 500 mg every 6 (six) hours as needed by mouth.   clotrimazole 1 % cream Commonly known as:  LOTRIMIN Apply 1 application topically as needed (foot -- infection/rash).   furosemide 40 MG tablet Commonly known as:  LASIX Take 0.5 tablets by mouth. Take 0.5 tablet by mouth Monday, Wednesday, Friday   isosorbide mononitrate 30 MG 24 hr tablet Commonly known as:  IMDUR Take 1 tablet (30 mg total) by mouth daily.   loperamide 2 MG capsule Commonly known as:  IMODIUM Take 1 capsule (2 mg total) by mouth as needed for diarrhea or loose stools.   meclizine 25 MG tablet Commonly known as:  ANTIVERT Take 25 mg by mouth 2 (two) times daily as needed for dizziness or nausea.   metoprolol succinate 25 MG 24 hr tablet Commonly known  as:  TOPROL-XL Take 25 mg by mouth 2 (two) times daily.   polyethylene glycol packet Commonly known as:  MIRALAX Take 17 g by mouth daily as needed for mild constipation.   potassium chloride SA 20 MEQ tablet Commonly known as:  K-DUR,KLOR-CON Take 20 mEq by mouth 2 (two) times daily.   PREMARIN 0.3 MG tablet Generic drug:  estrogens (conjugated) Take 0.3 mg daily by mouth.   PRENATAL PO Take 1 tablet by mouth daily.   REFRESH OP Apply 1 drop to eye 2 (two) times daily.       Vitals:   08/21/17 0424 08/21/17 1338  BP: (!) 121/50 (!) 115/56  Pulse: (!) 58 64  Resp: 19   Temp: 97.9 F (36.6 C)   SpO2: 97% 98%    Skin clean, dry and intact without evidence of skin break down, no evidence of skin tears noted. IV catheter discontinued intact. Site without signs and symptoms of complications. Dressing and pressure applied. Pt denies pain at this time. No complaints noted.  An After Visit Summary was printed and given to the patient. Patient escorted via [place], and D/C home via private auto.  Lolita Rieger 08/21/2017 6:21 PM

## 2017-08-21 NOTE — Care Management Obs Status (Signed)
[place] NOTIFICATION   Patient Details  Name: [name] MRN: [id number] Date of Birth: [birth year]   Medicare Observation Status Notification Given:  Yes    Leeroy Cha, RN 08/21/2017, 3:55 PM

## 2017-08-21 NOTE — Progress Notes (Signed)
Patient ID: [name], female   DOB: 13-Sep-1916, 84 y.o.   MRN: [id number]                                                                PROGRESS NOTE                                                                                                                                                                                                             Patient Demographics:    [name], is a 84 y.o. female, DOB - 07-14-1916, DPO:[id number]  Admit date - 08/20/2017   Admitting Physician Debbe Odea, MD  Outpatient Primary MD for the patient is Josetta Huddle, MD  LOS - 1  Outpatient Specialists:     Chief Complaint  Patient presents with  . Rectal Bleeding       Brief Narrative   84 y.o. female with medical history of CHF, CKD 3, CAD, who presents for rectal bleeding.  She was last admitted from 7/12-7/16 2018 for gross melena and endoscopy was deferred due to her age.  She had her first episode of bleeding about 7 PM yesterday, stool was maroon. Her second episode was at 3 AM and her next around 7 AM and these were bright red blood. She has felt abdominal pain across her lower abdomen which has been crampy and resolves after the bleeding episode. She has occasional heart burn and sometimes she feels she is very nauseated when she wakes up. She has lost 35 lb in 1 yr due to lack of appetite. She has not been lightheaded from the bleeding but does have occasional vertigo.   She avoids NSAIDs are recommended by her doctors and takes her medications appropriately.   ED Course: Hb normal at 13.1, no symptoms of dizziness- Troponin 0.05 - hemo occult negative      Subjective:    [name] today has not had any further bleeding.  Slight bilateral lower abdominal cramping. Denies fever, chills,  N/v, diarrhea, black stool   , No headache, No chest pain, No new weakness tingling or numbness, No Cough - SOB.    Assessment  & Plan :    Principal Problem:   GI  bleed Active Problems:   Essential hypertension, benign   CKD (chronic kidney disease)  stage 3, GFR 30-59 ml/min (HCC)      GI bleed - ? Lower GI bleed- hemoccult in ED negative - quite tender across her lower abdomen maybe due to cramping - check xray abdomen- GFR is only 35, CT abd pelvis=> 74mm left lower lobe nodule - not on any blood thinners - no need to transfuse yet - [facility] called by ED - at her age, they do not recommend any procedures and therefore management will be conservative - clear liquids, slow IVF ordered Advance today to full liquid diet Check cbc in AM  Active Problems: Mild Bradycardia and Essential hypertension  - Metoprolol 25 mg daily and Imdur with holding parameters - hold lasix  (7/24) -check cmp in am  H/o CHF Resume Lasix 20mg  po qday  Mildly elevated troponin -  No chest pain- no need to follow up    CKD (chronic kidney disease) stage 3, GFR 30-59 ml/min (HCC)  - follow   DVT prophylaxis: sCDs  Code Status:  FULL CODE  Family Communication:   Disposition Plan: follow for bleeding   Consults called: ED discussed with [facility]   Admission status: Inpatient      Lab Results  Component Value Date   PLT 192 08/20/2017    Antibiotics  :  none  Anti-infectives (From admission, onward)   None        Objective:   Vitals:   08/20/17 1205 08/20/17 1606 08/20/17 2037 08/21/17 0424  BP: 132/60 136/66 (!) 101/50 (!) 121/50  Pulse: 61 (!) 56 (!) 58 (!) 58  Resp: 18  16 19   Temp: 97.7 F (36.5 C)  97.7 F (36.5 C) 97.9 F (36.6 C)  TempSrc: Oral  Oral   SpO2: 99% 99% 97% 97%  Weight:      Height:        Wt Readings from Last 3 Encounters:  08/20/17 59.4 kg (131 lb)  12/12/16 61.8 kg (136 lb 3.2 oz)  09/05/16 68 kg (150 lb)     Intake/Output Summary (Last 24 hours) at 08/21/2017 0609 Last data filed at 08/20/2017 1813 Gross per 24 hour  Intake 1080 ml  Output -  Net 1080 ml     Physical Exam  Awake Alert,  Oriented X 3, No new F.N deficits, Normal affect [place].AT,PERRAL Supple Neck,No JVD, No cervical lymphadenopathy appriciated.  Symmetrical Chest wall movement, Good air movement bilaterally, CTAB RRR,No Gallops,Rubs or new Murmurs, No Parasternal Heave +ve B.Sounds, Abd Soft, No tenderness, No organomegaly appriciated, No rebound - guarding or rigidity. No Cyanosis, Clubbing or edema, No new Rash or bruise      Data Review:    CBC Recent Labs  Lab 08/20/17 1204  WBC 6.2  HGB 13.1  HCT 40.3  PLT 192  MCV 94.4  MCH 30.7  MCHC 32.5  RDW 14.3    Chemistries  Recent Labs  Lab 08/20/17 1204  NA 138  K 3.9  CL 105  CO2 23  GLUCOSE 88  BUN 18  CREATININE 1.39*  CALCIUM 9.2  AST 19  ALT 9  ALKPHOS 64  BILITOT 0.7   ------------------------------------------------------------------------------------------------------------------ No results for input(s): CHOL, HDL, LDLCALC, TRIG, CHOLHDL, LDLDIRECT in the last 72 hours.  No results found for: HGBA1C ------------------------------------------------------------------------------------------------------------------ No results for input(s): TSH, T4TOTAL, T3FREE, THYROIDAB in the last 72 hours.  Invalid input(s): FREET3 ------------------------------------------------------------------------------------------------------------------ No results for input(s): VITAMINB12, FOLATE, FERRITIN, TIBC, IRON, RETICCTPCT in the last 72 hours.  Coagulation profile Recent Labs  Lab  08/20/17 1625  INR 1.03    No results for input(s): DDIMER in the last 72 hours.  Cardiac Enzymes Recent Labs  Lab 08/20/17 1201  TROPONINI 0.05*   ------------------------------------------------------------------------------------------------------------------    Component Value Date/Time   BNP 62.8 08/04/2016 2221   BNP 142.0 (H) 12/27/2014 0852    Inpatient Medications  Scheduled Meds: . chlorhexidine  15 mL Mouth Rinse BID  . isosorbide  mononitrate  30 mg Oral Daily  . mouth rinse  15 mL Mouth Rinse q12n4p  . metoprolol tartrate  25 mg Oral BID   Continuous Infusions: . sodium chloride 75 mL/hr at 08/20/17 1622  . famotidine (PEPCID) IV     PRN Meds:.acetaminophen **OR** acetaminophen, ondansetron **OR** ondansetron (ZOFRAN) IV  Micro Results No results found for this or any previous visit (from the past 240 hour(s)).  Radiology Reports Ct Abdomen Pelvis Wo Contrast  Result Date: 08/20/2017 CLINICAL DATA:  Abdominal pain, nausea and vomiting. EXAM: CT ABDOMEN AND PELVIS WITHOUT CONTRAST TECHNIQUE: Multidetector CT imaging of the abdomen and pelvis was performed following the standard protocol without IV contrast. COMPARISON:  08/08/2016, 08/10/2013 and 07/05/2013. FINDINGS: Lower chest: Large hiatal hernia. 4 mm left lower lobe nodule on image number 18 series 4, without significant change since 08/10/2013. Small amount of linear scarring or atelectasis at the right lung base. Hepatobiliary: The gallbladder remains prominent with no wall thickening or pericholecystic fluid. Unremarkable liver. Pancreas: Unremarkable. No pancreatic ductal dilatation or surrounding inflammatory changes. Spleen: Normal in size without focal abnormality. Adrenals/Urinary Tract: Adrenal glands are unremarkable. Kidneys are normal, without renal calculi, focal lesion, or hydronephrosis. Bladder is unremarkable. Stomach/Bowel: Large hiatal hernia. Extensive colonic diverticulosis without evidence of diverticulitis. No evidence of appendicitis, surgically absent by history. Unremarkable small bowel. Vascular/Lymphatic: Mild atheromatous arterial calcifications without aneurysm. Mildly prominent retroperitoneal and pelvic lymph nodes with no abnormally enlarged lymph nodes visualized and without significant change. Reproductive: Status post hysterectomy. No adnexal masses. Other: Small umbilical hernia containing fat and fluid without significant change.  Musculoskeletal: Marked lumbar and lower thoracic spine degenerative changes with little change. Stable bone fusion at the T12 through L2 levels. IMPRESSION: 1. No acute abnormality. 2. Large hiatal hernia. 3. Stable 4 mm left lower lobe nodule. The long-term stability is compatible with a benign process. 4. Extensive colonic diverticulosis. Electronically Signed   By: Claudie Revering M.D.   On: 08/20/2017 17:30    Time Spent in minutes  30   Jani Gravel M.D on 08/21/2017 at 6:09 AM  Between 7am to 7pm - Pager - [phone]   After 7pm go to [email or web address] - password [facility]  [facility] -  Office  [phone]

## 2017-08-21 NOTE — Progress Notes (Signed)
[facility] Gastroenterology Progress Note  [name] 84 y.o. 1916/06/17   Subjective: No BMs overnight and no further bleeding. Feels good. Family at bedside.  Objective: Vital signs: Vitals:   08/20/17 2037 08/21/17 0424  BP: (!) 101/50 (!) 121/50  Pulse: (!) 58 (!) 58  Resp: 16 19  Temp: 97.7 F (36.5 C) 97.9 F (36.6 C)  SpO2: 97% 97%    Physical Exam: Gen: alert, no acute distress, elderly, frail, thin, pleasant HEENT: anicteric sclera CV: RRR Chest: CTA B Abd: diffuse tenderness with voluntary guarding, soft, nondistended, +BS  Lab Results: Recent Labs    08/20/17 1204 08/21/17 0537  NA 138 140  K 3.9 4.1  CL 105 111  CO2 23 22  GLUCOSE 88 84  BUN 18 13  CREATININE 1.39* 1.11*  CALCIUM 9.2 8.7*   Recent Labs    08/20/17 1204  AST 19  ALT 9  ALKPHOS 64  BILITOT 0.7  PROT 7.5  ALBUMIN 3.5   Recent Labs    08/20/17 1204  WBC 6.2  HGB 13.1  HCT 40.3  MCV 94.4  PLT 192      Assessment/Plan: Lower GI bleed that has resolved - CT scan negative for acute findings. Recheck CBC. Heart healthy diet. Ok to go home this afternoon if Hgb stable. D/W Dr. Maudie Mercury. Will sign off.   Morgan C. 08/21/2017, 11:24 AM  Questions please call [phone] ID: [name], female   DOB: 1916/11/26, 84 y.o.   MRN: [id number]

## 2017-08-21 NOTE — Discharge Summary (Signed)
[name], is a 84 y.o. female  DOB 12/21/1916  MRN [id number].  Admission date:  08/20/2017  Admitting Physician  Debbe Odea, MD  Discharge Date:  08/21/2017   Primary MD  Josetta Huddle, MD  Recommendations for primary care physician for things to follow:    GI bleed, hemoccult negative in ED CT abd pelvis 7/24 IMPRESSION: 1. No acute abnormality. 2. Large hiatal hernia. 3. Stable 4 mm left lower lobe nodule. The long-term stability is compatible with a benign process. 4. Extensive colonic diverticulosis. Please check cbc in 1 week with pcp  Constipation  miralax 17gm po qday prn   Mild Bradycardia and Essential hypertension  Cont Metoprolol XL  25 mg daily Cont Lasix 40mg  1/2 po qday Cont Imdur 30mg  po qday Please f/u with pcp in 1 week to check heart rate  Mildly elevated troponin -  No chest pain- likely due to mild renal insufficiency Please f/u with cardiology in 1-2 weeks    CKD (chronic kidney disease) stage 3, GFR 30-59 ml/min (HCC) Check cmp in 1 week with pcp       Admission Diagnosis  Abdominal pain [R10.9]   Discharge Diagnosis  Abdominal pain [R10.9]    Principal Problem:   GI bleed Active Problems:   Essential hypertension, benign   CKD (chronic kidney disease) stage 3, GFR 30-59 ml/min (HCC)      Past Medical History:  Diagnosis Date  . Allergic rhinitis   . CAD (coronary artery disease)   . CHF (congestive heart failure) ([place])   . CKD (chronic kidney disease) stage 3, GFR 30-59 ml/min (HCC) 09/12/2014  . Diverticular disease   . Diverticulosis   . DJD (degenerative joint disease) of knee   . DJD (degenerative joint disease) of lumbar spine    scoliosis  . Eczema   . Esophageal erosions    from nsaid   . GI bleed   . Hormone replacement therapy (postmenopausal)   . Hyperglycemia   . Hyperlipidemia   . Hypertension   . Pneumonia    july 2009  . Pulmonary hypertension ([place])    a. 2D echo 03/2008 showed normal biventricular thickness, chamber size and systolic function, EF 28-76%, mild mitral regurgitation, aortic sclerosis, moderate pulmonary HTN, pulmonic regurgitation.  Marland Kitchen PVC's (premature ventricular contractions)   . Reactive depression (situational)   . Sciatica    right lower extremity  . Shingles    right thoracic 2008  . Vertigo     Past Surgical History:  Procedure Laterality Date  . APPENDECTOMY    . BACK SURGERY    . BREAST LUMPECTOMY    . FLEXIBLE SIGMOIDOSCOPY Left 08/13/2013   Procedure: FLEXIBLE SIGMOIDOSCOPY;  Surgeon: Arta Silence, MD;  Location: [facility] ENDOSCOPY;  Service: Endoscopy;  Laterality: Left;  . TOTAL ABDOMINAL HYSTERECTOMY         HPI  from the history and physical done on the day of admission:     84 y.o.femalewith medical history ofCHF, CKD 3, CAD,  who presents for rectal bleeding.  She was last admitted from 7/12-7/16 2018 for gross melena and endoscopy was deferred due to her age. She had her first episode of bleeding about 7 PM yesterday, stool was maroon. Her second episode was at 3 AM and her next around 7 AM and these were bright red blood. She has felt abdominal pain across her lower abdomen which has been crampy and resolves after the bleeding episode. She has occasional heart burn and sometimes she feels she is very nauseated when she wakes up. She has lost 35 lb in 1 yr due to lack of appetite. She has not been lightheaded from the bleeding but does have occasional vertigo.  She avoids NSAIDs are recommended by her doctors and takes her medications appropriately.   ED Course:Hb normal at 13.1, no symptoms of dizziness- Troponin 0.05 - hemo occult negative       Hospital Course:     Pt was admitted CT abd/ pelvis 7/24=> 63mm left lower lung pulmonary nodule.  No obvious cause of GI bleeding. No evidence of malignancy. Pt had mildly elevated troponin, likely  due to renal insufficiency.  Pt has not had any chest pain or sob during this admission.  GI was consulted and recommended discharge home later today.  No intervention. Pt appears stable and will be discharged to home.        Follow UP  Follow-up Information    Josetta Huddle, MD Follow up in 1 week(s).   Specialty:  Internal Medicine Contact information: [address]  [place] [address] [phone]        Jettie Booze, MD .   Specialties:  Cardiology, Radiology, Interventional Cardiology Contact information: [address] [place] [address] [phone]            Consults obtained - GI  Discharge Condition: stable  Diet and Activity recommendation: See Discharge Instructions below  Discharge Instructions      Discharge Medications     Allergies as of 08/21/2017      Reactions   Ace Inhibitors Hives, Itching   Streptomycin Other (See Comments), Itching   Pruritus Pruritus   Tramadol Other (See Comments)   unknown   Vesicare [solifenacin Succinate] Itching   Penicillins Rash   Pruritic rash Has patient had a PCN reaction causing immediate rash, facial/tongue/throat swelling, SOB or lightheadedness with hypotension: unknown Has patient had a PCN reaction causing severe rash involving mucus membranes or skin necrosis: unknown Has patient had a PCN reaction that required hospitalization:unknown Has patient had a PCN reaction occurring within the last 10 years: yes If all of the above answers are "NO", then may proceed with Cephalosporin use.      Medication List    STOP taking these medications   sulfamethoxazole-trimethoprim 800-160 MG tablet Commonly known as:  BACTRIM DS,SEPTRA DS     TAKE these medications   acetaminophen 500 MG tablet Commonly known as:  TYLENOL Take 500 mg every 6 (six) hours as needed by mouth.   clotrimazole 1 % cream Commonly known as:  LOTRIMIN Apply 1 application  topically as needed (foot -- infection/rash).   furosemide 40 MG tablet Commonly known as:  LASIX Take 0.5 tablets by mouth. Take 0.5 tablet by mouth Monday, Wednesday, Friday   isosorbide mononitrate 30 MG 24 hr tablet Commonly known as:  IMDUR Take 1 tablet (30 mg total) by mouth daily.   loperamide 2 MG capsule Commonly known as:  IMODIUM Take 1 capsule (2 mg total) by mouth as needed for diarrhea or loose stools.   meclizine 25 MG tablet Commonly known as:  ANTIVERT Take 25 mg by mouth 2 (two) times daily as needed for dizziness or nausea.   metoprolol succinate 25 MG 24 hr tablet Commonly known as:  TOPROL-XL Take 25 mg by mouth 2 (two) times daily.   polyethylene glycol packet Commonly known as:  MIRALAX Take 17 g by mouth daily as needed for mild constipation.   potassium chloride SA 20 MEQ tablet Commonly known as:  K-DUR,KLOR-CON Take 20 mEq by mouth 2 (two) times daily.   PREMARIN 0.3 MG tablet Generic drug:  estrogens (conjugated) Take 0.3 mg daily by mouth.   PRENATAL PO Take 1 tablet by mouth daily.   REFRESH OP Apply 1 drop to eye 2 (two) times daily.       Major procedures and Radiology Reports - PLEASE review detailed and final reports for all details, in brief -       Ct Abdomen Pelvis Wo Contrast  Result Date: 08/20/2017 CLINICAL DATA:  Abdominal pain, nausea and vomiting. EXAM: CT ABDOMEN AND PELVIS WITHOUT CONTRAST TECHNIQUE: Multidetector CT imaging of the abdomen and pelvis was performed following the standard protocol without IV contrast. COMPARISON:  08/08/2016, 08/10/2013 and 07/05/2013. FINDINGS: Lower chest: Large hiatal hernia. 4 mm left lower lobe nodule on image number 18 series 4, without significant change since 08/10/2013. Small amount of linear scarring or atelectasis at the right lung base. Hepatobiliary: The gallbladder remains prominent with no wall thickening or pericholecystic fluid. Unremarkable liver. Pancreas: Unremarkable.  No pancreatic ductal dilatation or surrounding inflammatory changes. Spleen: Normal in size without focal abnormality. Adrenals/Urinary Tract: Adrenal glands are unremarkable. Kidneys are normal, without renal calculi, focal lesion, or hydronephrosis. Bladder is unremarkable. Stomach/Bowel: Large hiatal hernia. Extensive colonic diverticulosis without evidence of diverticulitis. No evidence of appendicitis, surgically absent by history. Unremarkable small bowel. Vascular/Lymphatic: Mild atheromatous arterial calcifications without aneurysm. Mildly prominent retroperitoneal and pelvic lymph nodes with no abnormally enlarged lymph nodes visualized and without significant change. Reproductive: Status post hysterectomy. No adnexal masses. Other: Small umbilical hernia containing fat and fluid without significant change. Musculoskeletal: Marked lumbar and lower thoracic spine degenerative changes with little change. Stable bone fusion at the T12 through L2 levels. IMPRESSION: 1. No acute abnormality. 2. Large hiatal hernia. 3. Stable 4 mm left lower lobe nodule. The long-term stability is compatible with a benign process. 4. Extensive colonic diverticulosis. Electronically Signed   By: Claudie Revering M.D.   On: 08/20/2017 17:30    Micro Results     No results found for this or any previous visit (from the past 240 hour(s)).     Today   Subjective    Lavanda Keckler today has no further bleeding. Pt denies heart burn and has slight lower abdominal cramp but that has resolved per pt.  Pt denies fever, chills, n/v, diarrhea, brbpr, black stool  Pt 's diet was advanced by GI who recommended that if Hgb stable that she can be discharged home.  They did not recommend endoscopy at this time.  Pt feels much better and would like to go home later today.     Objective   Blood pressure (!) 121/50, pulse (!) 58, temperature 97.9 F (36.6 C), resp. rate 19, height 5\' 2"  (1.575 m), weight 59.4 kg (131 lb), SpO2 97  %.   Intake/Output Summary (Last 24 hours) at 08/21/2017 1211 Last data filed  at 08/21/2017 0915 Gross per 24 hour  Intake 2222.5 ml  Output -  Net 2222.5 ml    Exam Awake Alert, Oriented x 3, No new F.N deficits, Normal affect [place].AT,PERRAL Supple Neck,No JVD, No cervical lymphadenopathy appriciated.  Symmetrical Chest wall movement, Good air movement bilaterally, CTAB RRR,No Gallops,Rubs or new Murmurs, No Parasternal Heave +ve B.Sounds, Abd Soft, Non tender, No organomegaly appriciated, No rebound -guarding or rigidity. No Cyanosis, Clubbing or edema, No new Rash or bruise   Data Review   CBC w Diff:  Lab Results  Component Value Date   WBC 6.2 08/20/2017   HGB 13.1 08/20/2017   HGB 12.9 05/28/2016   HCT 40.3 08/20/2017   HCT 39.1 05/28/2016   PLT 192 08/20/2017   PLT 208 05/28/2016   LYMPHOPCT 30 09/05/2016   MONOPCT 11 09/05/2016   EOSPCT 0 09/05/2016   BASOPCT 0 09/05/2016    CMP:  Lab Results  Component Value Date   NA 140 08/21/2017   NA 139 05/28/2016   K 4.1 08/21/2017   CL 111 08/21/2017   CO2 22 08/21/2017   BUN 13 08/21/2017   BUN 14 05/28/2016   CREATININE 1.11 (H) 08/21/2017   CREATININE 1.14 (H) 12/27/2014   PROT 7.5 08/20/2017   ALBUMIN 3.5 08/20/2017   BILITOT 0.7 08/20/2017   ALKPHOS 64 08/20/2017   AST 19 08/20/2017   ALT 9 08/20/2017  .   Total Time in preparing paper work, data evaluation and todays exam - 75 minutes  Jani Gravel M.D on 08/21/2017 at 12:11 PM  [facility]   Office  [phone]

## 2017-08-22 ENCOUNTER — Telehealth: Payer: Self-pay | Admitting: Interventional Cardiology

## 2017-08-22 NOTE — Telephone Encounter (Signed)
Pt would like a CONTINUATION OF CARE Apt please give them a call back with an appt.

## 2017-08-22 NOTE — Telephone Encounter (Signed)
Patient requesting follow-up appointment after being discharged from the hospital. Appointment made with Ermalinda Barrios, PA on 8/14 at 2:00 PM.

## 2017-08-27 DIAGNOSIS — K219 Gastro-esophageal reflux disease without esophagitis: Secondary | ICD-10-CM | POA: Diagnosis not present

## 2017-08-27 DIAGNOSIS — K5790 Diverticulosis of intestine, part unspecified, without perforation or abscess without bleeding: Secondary | ICD-10-CM | POA: Diagnosis not present

## 2017-08-27 DIAGNOSIS — R911 Solitary pulmonary nodule: Secondary | ICD-10-CM | POA: Diagnosis not present

## 2017-08-27 DIAGNOSIS — K921 Melena: Secondary | ICD-10-CM | POA: Diagnosis not present

## 2017-08-28 DIAGNOSIS — M2042 Other hammer toe(s) (acquired), left foot: Secondary | ICD-10-CM | POA: Diagnosis not present

## 2017-08-28 DIAGNOSIS — L97521 Non-pressure chronic ulcer of other part of left foot limited to breakdown of skin: Secondary | ICD-10-CM | POA: Diagnosis not present

## 2017-09-10 ENCOUNTER — Ambulatory Visit (INDEPENDENT_AMBULATORY_CARE_PROVIDER_SITE_OTHER): Payer: Medicare Other | Admitting: Physician Assistant

## 2017-09-10 ENCOUNTER — Encounter: Payer: Self-pay | Admitting: Physician Assistant

## 2017-09-10 VITALS — BP 110/62 | HR 73 | Ht 62.0 in | Wt 134.0 lb

## 2017-09-10 DIAGNOSIS — R9439 Abnormal result of other cardiovascular function study: Secondary | ICD-10-CM | POA: Diagnosis not present

## 2017-09-10 DIAGNOSIS — I10 Essential (primary) hypertension: Secondary | ICD-10-CM

## 2017-09-10 DIAGNOSIS — R0609 Other forms of dyspnea: Secondary | ICD-10-CM | POA: Diagnosis not present

## 2017-09-10 NOTE — Progress Notes (Signed)
Cardiology Office Note    Date:  09/10/2017   ID:  [name], DOB [birth year], MRN [id number]  PCP:  Josetta Huddle, MD  Cardiologist: Larae Grooms, MD  Chief Complaint  Patient presents with  . Hospitalization Follow-up    History of Present Illness:  [name] is a 84 y.o. female with history of abnormal stress test has been managed medically with no plans of elective ischemic evaluation given her age.  Also has hypertension, HLD, CKD stage III, she has chronic dyspnea on exertion likely from deconditioning, hypertension, palpitations with premature beats most likely PVCs, history of ulcers.  2D echo 06/12/2016 LVEF 45 to 50% with mild MR mild to moderate TR pulmonary artery peak pressure 42 mmHg.  Last saw Dr. Irish Lack 11/2016 at which time he was continued medical therapy.  Patient comes in for follow-up after recent hospitalization for GI bleed felt secondary to diverticulitis.  Her nephew brought her in.  She still lives alone in her home with an aide helping her in the morning and afternoon.  She notices an increase in her shortness of breath with exertion.  Sometimes she has ankle edema.  She does admit to eating a lot of salt.  She adds it to everything.  Sometimes her ankles swell.    Past Medical History:  Diagnosis Date  . Allergic rhinitis   . CAD (coronary artery disease)   . CHF (congestive heart failure) ([place])   . CKD (chronic kidney disease) stage 3, GFR 30-59 ml/min ([facility]) 09/12/2014  . Diverticular disease   . Diverticulosis   . DJD (degenerative joint disease) of knee   . DJD (degenerative joint disease) of lumbar spine    scoliosis  . Eczema   . Esophageal erosions    from nsaid   . GI bleed   . Hormone replacement therapy (postmenopausal)   . Hyperglycemia   . Hyperlipidemia   . Hypertension   . Pneumonia    july 2009  . Pulmonary hypertension ([place])    a. 2D echo 03/2008 showed normal biventricular thickness, chamber size and  systolic function, EF 66-44%, mild mitral regurgitation, aortic sclerosis, moderate pulmonary HTN, pulmonic regurgitation.  Marland Kitchen PVC's (premature ventricular contractions)   . Reactive depression (situational)   . Sciatica    right lower extremity  . Shingles    right thoracic 2008  . Vertigo     Past Surgical History:  Procedure Laterality Date  . APPENDECTOMY    . BACK SURGERY    . BREAST LUMPECTOMY    . FLEXIBLE SIGMOIDOSCOPY Left 08/13/2013   Procedure: FLEXIBLE SIGMOIDOSCOPY;  Surgeon: Arta Silence, MD;  Location: [facility];  Service: Endoscopy;  Laterality: Left;  . TOTAL ABDOMINAL HYSTERECTOMY      Current Medications: Current Meds  Medication Sig  . acetaminophen (TYLENOL) 500 MG tablet Take 500 mg every 6 (six) hours as needed by mouth.  . clotrimazole (LOTRIMIN) 1 % cream Apply 1 application topically as needed (foot -- infection/rash).   . furosemide (LASIX) 40 MG tablet Take 0.5 tablets by mouth. Take 0.5 tablet by mouth Monday, Wednesday, Friday  . isosorbide mononitrate (IMDUR) 30 MG 24 hr tablet Take 1 tablet (30 mg total) by mouth daily.  Marland Kitchen loperamide (IMODIUM) 2 MG capsule Take 1 capsule (2 mg total) by mouth as needed for diarrhea or loose stools.  . meclizine (ANTIVERT) 25 MG tablet Take 25 mg by mouth 2 (two) times daily as needed for dizziness or nausea.   Marland Kitchen  metoprolol succinate (TOPROL-XL) 25 MG 24 hr tablet Take 25 mg by mouth 2 (two) times daily.  . polyethylene glycol (MIRALAX) packet Take 17 g by mouth daily as needed for mild constipation.  . Polyvinyl Alcohol-Povidone (REFRESH OP) Apply 1 drop to eye 2 (two) times daily.  . potassium chloride SA (K-DUR,KLOR-CON) 20 MEQ tablet Take 20 mEq by mouth 2 (two) times daily.  Marland Kitchen PREMARIN 0.3 MG tablet Take 0.3 mg daily by mouth.  . Prenatal Vit-Fe Fumarate-FA (PRENATAL PO) Take 1 tablet by mouth daily.     Allergies:   Ace inhibitors; Streptomycin; Tramadol; Vesicare [solifenacin succinate]; and Penicillins    Social History   Socioeconomic History  . Marital status: Widowed    Spouse name: Not on file  . Number of children: 0  . Years of education: Not on file  . Highest education level: Not on file  Occupational History  . Occupation: retired Education officer, museum  Social Needs  . Financial resource strain: Not on file  . Food insecurity:    Worry: Not on file    Inability: Not on file  . Transportation needs:    Medical: Not on file    Non-medical: Not on file  Tobacco Use  . Smoking status: Never Smoker  . Smokeless tobacco: Never Used  Substance and Sexual Activity  . Alcohol use: Yes    Comment: occasional cocktail  . Drug use: No  . Sexual activity: Never  Lifestyle  . Physical activity:    Days per week: Not on file    Minutes per session: Not on file  . Stress: Not on file  Relationships  . Social connections:    Talks on phone: Not on file    Gets together: Not on file    Attends religious service: Not on file    Active member of club or organization: Not on file    Attends meetings of clubs or organizations: Not on file    Relationship status: Not on file  Other Topics Concern  . Not on file  Social History Narrative   Pt is widowed and lives alone.           Family History:  The patient's family history includes Cancer in her brother; Heart attack in her brother, father, mother, and sister; Heart disease in her daughter, father, mother, sister, and son; Hypertension in her brother.   ROS:   Please see the history of present illness.    Review of Systems  Constitution: Positive for decreased appetite, malaise/fatigue and weight loss.  HENT: Positive for hearing loss.   Cardiovascular: Positive for dyspnea on exertion and leg swelling.  Respiratory: Positive for shortness of breath.   Hematologic/Lymphatic: Bruises/bleeds easily.  Musculoskeletal: Positive for myalgias.       Left foot in boot because of nonhealing callus  Neurological: Positive for  dizziness and loss of balance.  Psychiatric/Behavioral: The patient is nervous/anxious.    All other systems reviewed and are negative.   PHYSICAL EXAM:   VS:  BP 110/62   Pulse 73   Ht 5\' 2"  (1.575 m)   Wt 134 lb (60.8 kg)   SpO2 98%   BMI 24.51 kg/m   Physical Exam  GEN: Well nourished, well developed, very young for stated age in no acute distress  Neck: no JVD, carotid bruits, or masses Cardiac:RRR; 1/6 systolic murmur at left sternal border Respiratory:  clear to auscultation bilaterally, normal work of breathing GI: soft, nontender, nondistended, + BS Ext:  Trace of ankle edema bilaterally without cyanosis, clubbing,  Good distal pulses bilaterally MS: no deformity or atrophy  Skin: warm and dry, no rash Neuro:  Alert and Oriented x 3 Psych: euthymic mood, full affect  Wt Readings from Last 3 Encounters:  09/10/17 134 lb (60.8 kg)  08/20/17 131 lb (59.4 kg)  12/12/16 136 lb 3.2 oz (61.8 kg)      Studies/Labs Reviewed:   EKG:  EKG is not ordered today.  EKG reviewed from the emergency room 08/20/2017 normal sinus rhythm with LVH, no acute change Recent Labs: 08/20/2017: ALT 9 08/21/2017: BUN 13; Creatinine, Ser 1.11; Hemoglobin 12.1; Platelets 179; Potassium 4.1; Sodium 140   Lipid Panel No results found for: CHOL, TRIG, HDL, CHOLHDL, VLDL, LDLCALC, LDLDIRECT  Additional studies/ records that were reviewed today include:   Echo in 5/18: Left ventricle: Abnormal septal motion. The cavity size was   mildly dilated. Wall thickness was normal. Systolic function was   mildly reduced. The estimated ejection fraction was in the range   of 45% to 50%. Doppler parameters are consistent with both   elevated ventricular end-diastolic filling pressure and elevated   left atrial filling pressure. - Mitral valve: There was mild regurgitation. - Atrial septum: No defect or patent foramen ovale was identified. - Tricuspid valve: There was mild-moderate regurgitation. -  Pulmonary arteries: PA peak pressure: 42 mm Hg (S).      ASSESSMENT:    1. Essential hypertension, benign   2. Abnormal stress test   3. DOE (dyspnea on exertion)      PLAN:  In order of problems listed above:  Essential hypertension pressure well controlled  History of abnormal stress test to be treated medically.  Chronic dyspnea on exertion could be anginal but given her age no further treatment  Chronic systolic and diastolic CHF LVEF 45 to 31% with increased filling pressures on echo 05/2016 no evidence of heart failure on exam.  Does get extra salt in her diet.  Have given her 2 g sodium diet.  Told her she could take an extra Lasix if needed for leg swelling or weight gain of 2 or 3 pounds overnight.  Has follow-up with Dr. Irish Lack in October that she would like to keep.  Medication Adjustments/Labs and Tests Ordered: Current medicines are reviewed at length with the patient today.  Concerns regarding medicines are outlined above.  Medication changes, Labs and Tests ordered today are listed in the Patient Instructions below. There are no Patient Instructions on file for this visit.   Sumner Boast, PA-C  09/10/2017 2:30 PM    [facility] [place], [place], [place]  [address] Phone: [phone]; Fax: [phone]

## 2017-09-10 NOTE — Patient Instructions (Addendum)
Medication Instructions: Your physician recommends that you continue on your current medications as directed. Please refer to the Current Medication list given to you today.  Labwork: None Ordered  Procedures/Testing: None Ordered  Follow-Up: Keep follow up appointment with Dr.Varanasi on 11/17/17   Any Additional Special Instructions Will Be Listed Below (If Applicable).  Two Gram Sodium Diet 2000 mg  What is Sodium? Sodium is a mineral found naturally in many foods. The most significant source of sodium in the diet is table salt, which is about 40% sodium.  Processed, convenience, and preserved foods also contain a large amount of sodium.  The body needs only 500 mg of sodium daily to function,  A normal diet provides more than enough sodium even if you do not use salt.  Why Limit Sodium? A build up of sodium in the body can cause thirst, increased blood pressure, shortness of breath, and water retention.  Decreasing sodium in the diet can reduce edema and risk of heart attack or stroke associated with high blood pressure.  Keep in mind that there are many other factors involved in these health problems.  Heredity, obesity, lack of exercise, cigarette smoking, stress and what you eat all play a role.  General Guidelines:  Do not add salt at the table or in cooking.  One teaspoon of salt contains over 2 grams of sodium.  Read food labels  Avoid processed and convenience foods  Ask your dietitian before eating any foods not dicussed in the menu planning guidelines  Consult your physician if you wish to use a salt substitute or a sodium containing medication such as antacids.  Limit milk and milk products to 16 oz (2 cups) per day.  Shopping Hints:  READ LABELS!! "Dietetic" does not necessarily mean low sodium.  Salt and other sodium ingredients are often added to foods during processing.   Menu Planning Guidelines Food Group Choose More Often Avoid  Beverages (see also the  milk group All fruit juices, low-sodium, salt-free vegetables juices, low-sodium carbonated beverages Regular vegetable or tomato juices, commercially softened water used for drinking or cooking  Breads and Cereals Enriched white, wheat, rye and pumpernickel bread, hard rolls and dinner rolls; muffins, cornbread and waffles; most dry cereals, cooked cereal without added salt; unsalted crackers and breadsticks; low sodium or homemade bread crumbs Bread, rolls and crackers with salted tops; quick breads; instant hot cereals; pancakes; commercial bread stuffing; self-rising flower and biscuit mixes; regular bread crumbs or cracker crumbs  Desserts and Sweets Desserts and sweets mad with mild should be within allowance Instant pudding mixes and cake mixes  Fats Butter or margarine; vegetable oils; unsalted salad dressings, regular salad dressings limited to 1 Tbs; light, sour and heavy cream Regular salad dressings containing bacon fat, bacon bits, and salt pork; snack dips made with instant soup mixes or processed cheese; salted nuts  Fruits Most fresh, frozen and canned fruits Fruits processed with salt or sodium-containing ingredient (some dried fruits are processed with sodium sulfites        Vegetables Fresh, frozen vegetables and low- sodium canned vegetables Regular canned vegetables, sauerkraut, pickled vegetables, and others prepared in brine; frozen vegetables in sauces; vegetables seasoned with ham, bacon or salt pork  Condiments, Sauces, Miscellaneous  Salt substitute with physician's approval; pepper, herbs, spices; vinegar, lemon or lime juice; hot pepper sauce; garlic powder, onion powder, low sodium soy sauce (1 Tbs.); low sodium condiments (ketchup, chili sauce, mustard) in limited amounts (1 tsp.) fresh ground horseradish; unsalted  tortilla chips, pretzels, potato chips, popcorn, salsa (1/4 cup) Any seasoning made with salt including garlic salt, celery salt, onion salt, and seasoned salt;  sea salt, rock salt, kosher salt; meat tenderizers; monosodium glutamate; mustard, regular soy sauce, barbecue, sauce, chili sauce, teriyaki sauce, steak sauce, Worcestershire sauce, and most flavored vinegars; canned gravy and mixes; regular condiments; salted snack foods, olives, picles, relish, horseradish sauce, catsup   Food preparation: Try these seasonings Meats:    Pork Sage, onion Serve with applesauce  Chicken Poultry seasoning, thyme, parsley Serve with cranberry sauce  Lamb Curry powder, rosemary, garlic, thyme Serve with mint sauce or jelly  Veal Marjoram, basil Serve with current jelly, cranberry sauce  Beef Pepper, bay leaf Serve with dry mustard, unsalted chive butter  Fish [place] leaf, dill Serve with unsalted lemon butter, unsalted parsley butter  Vegetables:    Asparagus Lemon juice   Broccoli Lemon juice   Carrots Mustard dressing parsley, mint, nutmeg, glazed with unsalted butter and sugar   Green beans Marjoram, lemon juice, nutmeg,dill seed   Tomatoes Basil, marjoram, onion   Spice /blend for [employer]" 4 tsp ground thyme 1 tsp ground sage 3 tsp ground rosemary 4 tsp ground marjoram   Test your knowledge 1. A product that says "Salt Free" may still contain sodium. True or False 2. Garlic Powder and Hot Pepper Sauce an be used as alternative seasonings.True or False 3. Processed foods have more sodium than fresh foods.  True or False 4. Canned Vegetables have less sodium than froze True or False  WAYS TO DECREASE YOUR SODIUM INTAKE 1. Avoid the use of added salt in cooking and at the table.  Table salt (and other prepared seasonings which contain salt) is probably one of the greatest sources of sodium in the diet.  Unsalted foods can gain flavor from the sweet, sour, and butter taste sensations of herbs and spices.  Instead of using salt for seasoning, try the following seasonings with the foods listed.  Remember: how you use them to enhance natural food flavors is  limited only by your creativity... Allspice-Meat, fish, eggs, fruit, peas, red and yellow vegetables Almond Extract-Fruit baked goods Anise Seed-Sweet breads, fruit, carrots, beets, cottage cheese, cookies (tastes like licorice) Basil-Meat, fish, eggs, vegetables, rice, vegetables salads, soups, sauces Bay Leaf-Meat, fish, stews, poultry Burnet-Salad, vegetables (cucumber-like flavor) Caraway Seed-Bread, cookies, cottage cheese, meat, vegetables, cheese, rice Cardamon-Baked goods, fruit, soups Celery Powder or seed-Salads, salad dressings, sauces, meatloaf, soup, bread.Do not use  celery salt Chervil-Meats, salads, fish, eggs, vegetables, cottage cheese (parsley-like flavor) Chili Power-Meatloaf, chicken cheese, corn, eggplant, egg dishes Chives-Salads cottage cheese, egg dishes, soups, vegetables, sauces Cilantro-Salsa, casseroles Cinnamon-Baked goods, fruit, pork, lamb, chicken, carrots Cloves-Fruit, baked goods, fish, pot roast, green beans, beets, carrots Coriander-Pastry, cookies, meat, salads, cheese (lemon-orange flavor) Cumin-Meatloaf, fish,cheese, eggs, cabbage,fruit pie (caraway flavor) [employer], fruit, eggs, fish, poultry, cottage cheese, vegetables Dill Seed-Meat, cottage cheese, poultry, vegetables, fish, salads, bread Fennel Seed-Bread, cookies, apples, pork, eggs, fish, beets, cabbage, cheese, Licorice-like flavor Garlic-(buds or powder) Salads, meat, poultry, fish, bread, butter, vegetables, potatoes.Do not  use garlic salt Ginger-Fruit, vegetables, baked goods, meat, fish, poultry Horseradish Root-Meet, vegetables, butter Lemon Juice or Extract-Vegetables, fruit, tea, baked goods, fish salads Mace-Baked goods fruit, vegetables, fish, poultry (taste like nutmeg) Maple Extract-Syrups Marjoram-Meat, chicken, fish, vegetables, breads, green salads (taste like Sage) Mint-Tea, lamb, sherbet, vegetables, desserts, carrots, cabbage Mustard, Dry or Seed-Cheese, eggs,  meats, vegetables, poultry Nutmeg-Baked goods, fruit, chicken, eggs, vegetables, desserts Onion  Powder-Meat, fish, poultry, vegetables, cheese, eggs, bread, rice salads (Do not use   Onion salt) Orange Extract-Desserts, baked goods Oregano-Pasta, eggs, cheese, onions, pork, lamb, fish, chicken, vegetables, green salads Paprika-Meat, fish, poultry, eggs, cheese, vegetables Parsley Flakes-Butter, vegetables, meat fish, poultry, eggs, bread, salads (certain forms may   Contain sodium Pepper-Meat fish, poultry, vegetables, eggs Peppermint Extract-Desserts, baked goods Poppy Seed-Eggs, bread, cheese, fruit dressings, baked goods, noodles, vegetables, cottage  [employer], poultry, meat, fish, cauliflower, turnips,eggs bread Saffron-Rice, bread, veal, chicken, fish, eggs Sage-Meat, fish, poultry, onions, eggplant, tomateos, pork, stews Savory-Eggs, salads, poultry, meat, rice, vegetables, soups, pork Tarragon-Meat, poultry, fish, eggs, butter, vegetables (licorice-like flavor)  Thyme-Meat, poultry, fish, eggs, vegetables, (clover-like flavor), sauces, soups Tumeric-Salads, butter, eggs, fish, rice, vegetables (saffron-like flavor) Vanilla Extract-Baked goods, candy Vinegar-Salads, vegetables, meat marinades Walnut Extract-baked goods, candy  2. Choose your Foods Wisely   The following is a list of foods to avoid which are high in sodium:  Meats-Avoid all smoked, canned, salt cured, dried and kosher meat and fish as well as Anchovies   [employer] meats:Bologna, Liverwurst, Pastrami Canned meat or fish  Marinated herring Caviar    Pepperoni Corned Beef   Pizza Dried chipped beef  Salami Frozen breaded fish or meat Salt pork Frankfurters or hot dogs  Sardines Gefilte fish   Sausage Ham (boiled ham, Proscuitto Smoked butt    spiced ham)   Spam      TV Dinners Vegetables Canned vegetables (Regular) Relish Canned  mushrooms  Sauerkraut Olives    Tomato juice Pickles  Bakery and Dessert Products Canned puddings  Cream pies Cheesecake   Decorated cakes Cookies  Beverages/Juices Tomato juice, regular  Gatorade   V-8 vegetable juice, regular  Breads and Cereals Biscuit mixes   Salted potato chips, corn chips, pretzels Bread stuffing mixes  Salted crackers and rolls Pancake and waffle mixes Self-rising flour  Seasonings Accent    Meat sauces Barbecue sauce  Meat tenderizer Catsup    Monosodium glutamate (MSG) Celery salt   Onion salt Chili sauce   Prepared mustard Garlic salt   Salt, seasoned salt, sea salt Gravy mixes   Soy sauce Horseradish   Steak sauce Ketchup   Tartar sauce Lite salt    Teriyaki sauce Marinade mixes   Worcestershire sauce  Others Baking powder   Cocoa and cocoa mixes Baking soda   Commercial casserole mixes Candy-caramels, chocolate  Dehydrated soups    Bars, fudge,nougats  Instant rice and pasta mixes Canned broth or soup  Maraschino cherries Cheese, aged and processed cheese and cheese spreads  Learning Assessment Quiz  Indicated T (for True) or F (for False) for each of the following statements:  1. _____ Fresh fruits and vegetables and unprocessed grains are generally low in sodium 2. _____ Water may contain a considerable amount of sodium, depending on the source 3. _____ You can always tell if a food is high in sodium by tasting it 4. _____ Certain laxatives my be high in sodium and should be avoided unless prescribed   by a physician or pharmacist 5. _____ Salt substitutes may be used freely by anyone on a sodium restricted diet 6. _____ Sodium is present in table salt, food additives and as a natural component of   most foods 7. _____ Table salt is approximately 90% sodium 8. _____ Limiting sodium intake may help prevent excess fluid accumulation in the body 9. _____ On a sodium-restricted diet, seasonings such as bouillon  soy sauce, and    cooking  wine should be used in place of table salt 10. _____ On an ingredient list, a product which lists monosodium glutamate as the first   ingredient is an appropriate food to include on a low sodium diet  Circle the best answer(s) to the following statements (Hint: there may be more than one correct answer)  11. On a low-sodium diet, some acceptable snack items are:    A. Olives  F. Bean dip   K. Grapefruit juice    B. Salted Pretzels G. Commercial Popcorn   L. Canned peaches    C. Carrot Sticks  H. Bouillon   M. Unsalted nuts   D. [place] fries  I. Peanut butter crackers N. Salami   E. Sweet pickles J. Tomato Juice   O. Pizza  12.  Seasonings that may be used freely on a reduced - sodium diet include   A. Lemon wedges F.Monosodium glutamate K. Celery seed    B.Soysauce   G. Pepper   L. Mustard powder   C. Sea salt  H. Cooking wine  M. Onion flakes   D. Vinegar  E. Prepared horseradish N. Salsa   E. Sage   J. Worcestershire sauce  O. Chutney    If you need a refill on your cardiac medications before your next appointment, please call your pharmacy.

## 2017-09-12 DIAGNOSIS — L97522 Non-pressure chronic ulcer of other part of left foot with fat layer exposed: Secondary | ICD-10-CM | POA: Diagnosis not present

## 2017-09-19 DIAGNOSIS — M24575 Contracture, left foot: Secondary | ICD-10-CM | POA: Diagnosis not present

## 2017-09-26 DIAGNOSIS — K5900 Constipation, unspecified: Secondary | ICD-10-CM | POA: Diagnosis not present

## 2017-10-23 DIAGNOSIS — Z23 Encounter for immunization: Secondary | ICD-10-CM | POA: Diagnosis not present

## 2017-11-10 DIAGNOSIS — L97522 Non-pressure chronic ulcer of other part of left foot with fat layer exposed: Secondary | ICD-10-CM | POA: Diagnosis not present

## 2017-11-14 NOTE — Progress Notes (Deleted)
Cardiology Office Note   Date:  11/14/2017   ID:  [name], DOB [birth year], MRN [id number]  PCP:  Morgan Huddle, MD    No chief complaint on file.    Wt Readings from Last 3 Encounters:  09/10/17 134 lb (60.8 kg)  08/20/17 131 lb (59.4 kg)  12/12/16 136 lb 3.2 oz (61.8 kg)       History of Present Illness: [name] is a 84 y.o. female  who had an abnormal stress test several years ago. She has been medically managed for this.  She has had chronic DOE.  She was hospitalized in August 2016 for UTI which led to Escherichia coli bacteremia.  Echo in 5/18: Left ventricle: Abnormal septal motion. The cavity size was mildly dilated. Wall thickness was normal. Systolic function was mildly reduced. The estimated ejection fraction was in the range of 45% to 50%. Doppler parameters are consistent with both elevated ventricular end-diastolic filling pressure and elevated left atrial filling pressure. - Mitral valve: There was mild regurgitation. - Atrial septum: No defect or patent foramen ovale was identified. - Tricuspid valve: There was mild-moderate regurgitation. - Pulmonary arteries: PA peak pressure: 42 mm Hg (S).  She was hospitalized for GI bleed in July 2018.  She had UGI, no EGD due to age.  She feels that she has not gotten back to the level where she was.    SHe was in rehab.  She had HHPT.  She tries to walk    Past Medical History:  Diagnosis Date  . Allergic rhinitis   . CAD (coronary artery disease)   . CHF (congestive heart failure) ([place])   . CKD (chronic kidney disease) stage 3, GFR 30-59 ml/min (HCC) 09/12/2014  . Diverticular disease   . Diverticulosis   . DJD (degenerative joint disease) of knee   . DJD (degenerative joint disease) of lumbar spine    scoliosis  . Eczema   . Esophageal erosions    from nsaid   . GI bleed   . Hormone replacement therapy (postmenopausal)   . Hyperglycemia   . Hyperlipidemia   .  Hypertension   . Pneumonia    july 2009  . Pulmonary hypertension ([place])    a. 2D echo 03/2008 showed normal biventricular thickness, chamber size and systolic function, EF 63-01%, mild mitral regurgitation, aortic sclerosis, moderate pulmonary HTN, pulmonic regurgitation.  Marland Kitchen PVC's (premature ventricular contractions)   . Reactive depression (situational)   . Sciatica    right lower extremity  . Shingles    right thoracic 2008  . Vertigo     Past Surgical History:  Procedure Laterality Date  . APPENDECTOMY    . BACK SURGERY    . BREAST LUMPECTOMY    . FLEXIBLE SIGMOIDOSCOPY Left 08/13/2013   Procedure: FLEXIBLE SIGMOIDOSCOPY;  Surgeon: Arta Silence, MD;  Location: [facility];  Service: Endoscopy;  Laterality: Left;  . TOTAL ABDOMINAL HYSTERECTOMY       Current Outpatient Medications  Medication Sig Dispense Refill  . acetaminophen (TYLENOL) 500 MG tablet Take 500 mg every 6 (six) hours as needed by mouth.    . clotrimazole (LOTRIMIN) 1 % cream Apply 1 application topically as needed (foot -- infection/rash).     . furosemide (LASIX) 40 MG tablet Take 0.5 tablets by mouth. Take 0.5 tablet by mouth Monday, Wednesday, Friday  0  . isosorbide mononitrate (IMDUR) 30 MG 24 hr tablet Take 1 tablet (30 mg total) by mouth daily. 90 tablet  3  . loperamide (IMODIUM) 2 MG capsule Take 1 capsule (2 mg total) by mouth as needed for diarrhea or loose stools. 30 capsule 0  . meclizine (ANTIVERT) 25 MG tablet Take 25 mg by mouth 2 (two) times daily as needed for dizziness or nausea.     . metoprolol succinate (TOPROL-XL) 25 MG 24 hr tablet Take 25 mg by mouth 2 (two) times daily.    . polyethylene glycol (MIRALAX) packet Take 17 g by mouth daily as needed for mild constipation. 14 each 0  . Polyvinyl Alcohol-Povidone (REFRESH OP) Apply 1 drop to eye 2 (two) times daily.    . potassium chloride SA (K-DUR,KLOR-CON) 20 MEQ tablet Take 20 mEq by mouth 2 (two) times daily.    Marland Kitchen PREMARIN 0.3 MG tablet  Take 0.3 mg daily by mouth.  0  . Prenatal Vit-Fe Fumarate-FA (PRENATAL PO) Take 1 tablet by mouth daily.     No current facility-administered medications for this visit.     Allergies:   Ace inhibitors; Streptomycin; Tramadol; Vesicare [solifenacin succinate]; and Penicillins    Social History:  The patient  reports that she has never smoked. She has never used smokeless tobacco. She reports that she drinks alcohol. She reports that she does not use drugs.   Family History:  The patient's ***family history includes Cancer in her brother; Heart attack in her brother, father, mother, and sister; Heart disease in her daughter, father, mother, sister, and son; Hypertension in her brother.    ROS:  Please see the history of present illness.   Otherwise, review of systems are positive for ***.   All other systems are reviewed and negative.    PHYSICAL EXAM: VS:  There were no vitals taken for this visit. , BMI There is no height or weight on file to calculate BMI. GEN: Well nourished, well developed, in no acute distress  HEENT: normal  Neck: no JVD, carotid bruits, or masses Cardiac: ***RRR; no murmurs, rubs, or gallops,no edema  Respiratory:  clear to auscultation bilaterally, normal work of breathing GI: soft, nontender, nondistended, + BS MS: no deformity or atrophy  Skin: warm and dry, no rash Neuro:  Strength and sensation are intact Psych: euthymic mood, full affect   EKG:   The ekg ordered today demonstrates ***   Recent Labs: 08/20/2017: ALT 9 08/21/2017: BUN 13; Creatinine, Ser 1.11; Hemoglobin 12.1; Platelets 179; Potassium 4.1; Sodium 140   Lipid Panel No results found for: CHOL, TRIG, HDL, CHOLHDL, VLDL, LDLCALC, LDLDIRECT   Other studies Reviewed: Additional studies/ records that were reviewed today with results demonstrating: ***.   ASSESSMENT AND PLAN:  1. Abnormal stress: test: 2. SHOB: 3. HTN:   Current medicines are reviewed at length with the patient  today.  The patient concerns regarding her medicines were addressed.  The following changes have been made:  No change***  Labs/ tests ordered today include: *** No orders of the defined types were placed in this encounter.   Recommend 150 minutes/week of aerobic exercise Low fat, low carb, high fiber diet recommended  Disposition:   FU in ***   Signed, Larae Grooms, MD  11/14/2017 3:37 PM    [facility] [place], [place], [place]  [address] Phone: [phone]; Fax: [phone]

## 2017-11-17 ENCOUNTER — Encounter

## 2017-11-17 ENCOUNTER — Ambulatory Visit: Payer: Medicare Other | Admitting: Interventional Cardiology

## 2017-11-18 ENCOUNTER — Encounter: Payer: Self-pay | Admitting: Interventional Cardiology

## 2017-11-26 DIAGNOSIS — K5900 Constipation, unspecified: Secondary | ICD-10-CM | POA: Diagnosis not present

## 2017-11-26 DIAGNOSIS — R911 Solitary pulmonary nodule: Secondary | ICD-10-CM | POA: Diagnosis not present

## 2017-11-26 DIAGNOSIS — K5790 Diverticulosis of intestine, part unspecified, without perforation or abscess without bleeding: Secondary | ICD-10-CM | POA: Diagnosis not present

## 2017-11-26 DIAGNOSIS — L84 Corns and callosities: Secondary | ICD-10-CM | POA: Diagnosis not present

## 2017-11-26 DIAGNOSIS — I10 Essential (primary) hypertension: Secondary | ICD-10-CM | POA: Diagnosis not present

## 2017-11-26 DIAGNOSIS — K921 Melena: Secondary | ICD-10-CM | POA: Diagnosis not present

## 2017-11-26 DIAGNOSIS — K219 Gastro-esophageal reflux disease without esophagitis: Secondary | ICD-10-CM | POA: Diagnosis not present

## 2017-11-26 DIAGNOSIS — I5032 Chronic diastolic (congestive) heart failure: Secondary | ICD-10-CM | POA: Diagnosis not present

## 2017-11-26 DIAGNOSIS — I2510 Atherosclerotic heart disease of native coronary artery without angina pectoris: Secondary | ICD-10-CM | POA: Diagnosis not present

## 2017-11-26 DIAGNOSIS — H9193 Unspecified hearing loss, bilateral: Secondary | ICD-10-CM | POA: Diagnosis not present

## 2017-12-12 DIAGNOSIS — I2510 Atherosclerotic heart disease of native coronary artery without angina pectoris: Secondary | ICD-10-CM | POA: Diagnosis not present

## 2017-12-12 DIAGNOSIS — I25111 Atherosclerotic heart disease of native coronary artery with angina pectoris with documented spasm: Secondary | ICD-10-CM | POA: Diagnosis not present

## 2017-12-12 DIAGNOSIS — I509 Heart failure, unspecified: Secondary | ICD-10-CM | POA: Diagnosis not present

## 2017-12-12 DIAGNOSIS — I10 Essential (primary) hypertension: Secondary | ICD-10-CM | POA: Diagnosis not present

## 2017-12-12 DIAGNOSIS — F322 Major depressive disorder, single episode, severe without psychotic features: Secondary | ICD-10-CM | POA: Diagnosis not present

## 2017-12-12 DIAGNOSIS — M179 Osteoarthritis of knee, unspecified: Secondary | ICD-10-CM | POA: Diagnosis not present

## 2017-12-12 DIAGNOSIS — M169 Osteoarthritis of hip, unspecified: Secondary | ICD-10-CM | POA: Diagnosis not present

## 2017-12-12 DIAGNOSIS — F341 Dysthymic disorder: Secondary | ICD-10-CM | POA: Diagnosis not present

## 2017-12-12 DIAGNOSIS — F329 Major depressive disorder, single episode, unspecified: Secondary | ICD-10-CM | POA: Diagnosis not present

## 2017-12-12 DIAGNOSIS — M1990 Unspecified osteoarthritis, unspecified site: Secondary | ICD-10-CM | POA: Diagnosis not present

## 2017-12-12 DIAGNOSIS — N183 Chronic kidney disease, stage 3 (moderate): Secondary | ICD-10-CM | POA: Diagnosis not present

## 2017-12-22 DIAGNOSIS — H04123 Dry eye syndrome of bilateral lacrimal glands: Secondary | ICD-10-CM | POA: Diagnosis not present

## 2017-12-22 DIAGNOSIS — Z961 Presence of intraocular lens: Secondary | ICD-10-CM | POA: Diagnosis not present

## 2017-12-22 DIAGNOSIS — H11829 Conjunctivochalasis, unspecified eye: Secondary | ICD-10-CM | POA: Diagnosis not present

## 2018-01-01 ENCOUNTER — Encounter (HOSPITAL_COMMUNITY): Payer: Self-pay

## 2018-01-01 ENCOUNTER — Inpatient Hospital Stay (HOSPITAL_COMMUNITY)
Admission: EM | Admit: 2018-01-01 | Discharge: 2018-01-04 | DRG: 690 | Disposition: A | Discharge status: Home / Self Care | Payer: Medicare Other | Attending: Internal Medicine | Admitting: Internal Medicine

## 2018-01-01 ENCOUNTER — Emergency Department (HOSPITAL_COMMUNITY): Payer: Medicare Other

## 2018-01-01 ENCOUNTER — Other Ambulatory Visit: Payer: Self-pay

## 2018-01-01 DIAGNOSIS — B9620 Unspecified Escherichia coli [E. coli] as the cause of diseases classified elsewhere: Secondary | ICD-10-CM | POA: Diagnosis present

## 2018-01-01 DIAGNOSIS — Z66 Do not resuscitate: Secondary | ICD-10-CM | POA: Diagnosis present

## 2018-01-01 DIAGNOSIS — I5022 Chronic systolic (congestive) heart failure: Secondary | ICD-10-CM | POA: Diagnosis not present

## 2018-01-01 DIAGNOSIS — K5900 Constipation, unspecified: Secondary | ICD-10-CM

## 2018-01-01 DIAGNOSIS — Z881 Allergy status to other antibiotic agents status: Secondary | ICD-10-CM

## 2018-01-01 DIAGNOSIS — R410 Disorientation, unspecified: Secondary | ICD-10-CM | POA: Diagnosis not present

## 2018-01-01 DIAGNOSIS — Z888 Allergy status to other drugs, medicaments and biological substances status: Secondary | ICD-10-CM

## 2018-01-01 DIAGNOSIS — N183 Chronic kidney disease, stage 3 unspecified: Secondary | ICD-10-CM | POA: Diagnosis present

## 2018-01-01 DIAGNOSIS — I700 Atherosclerosis of aorta: Secondary | ICD-10-CM | POA: Diagnosis present

## 2018-01-01 DIAGNOSIS — G9349 Other encephalopathy: Secondary | ICD-10-CM | POA: Diagnosis present

## 2018-01-01 DIAGNOSIS — K5730 Diverticulosis of large intestine without perforation or abscess without bleeding: Secondary | ICD-10-CM | POA: Diagnosis not present

## 2018-01-01 DIAGNOSIS — G9340 Encephalopathy, unspecified: Secondary | ICD-10-CM | POA: Diagnosis present

## 2018-01-01 DIAGNOSIS — I340 Nonrheumatic mitral (valve) insufficiency: Secondary | ICD-10-CM | POA: Diagnosis present

## 2018-01-01 DIAGNOSIS — R1032 Left lower quadrant pain: Secondary | ICD-10-CM | POA: Diagnosis not present

## 2018-01-01 DIAGNOSIS — N390 Urinary tract infection, site not specified: Principal | ICD-10-CM | POA: Diagnosis present

## 2018-01-01 DIAGNOSIS — Z79899 Other long term (current) drug therapy: Secondary | ICD-10-CM

## 2018-01-01 DIAGNOSIS — D649 Anemia, unspecified: Secondary | ICD-10-CM | POA: Diagnosis present

## 2018-01-01 DIAGNOSIS — E785 Hyperlipidemia, unspecified: Secondary | ICD-10-CM | POA: Diagnosis not present

## 2018-01-01 DIAGNOSIS — R109 Unspecified abdominal pain: Secondary | ICD-10-CM | POA: Diagnosis present

## 2018-01-01 DIAGNOSIS — R404 Transient alteration of awareness: Secondary | ICD-10-CM | POA: Diagnosis not present

## 2018-01-01 DIAGNOSIS — K449 Diaphragmatic hernia without obstruction or gangrene: Secondary | ICD-10-CM | POA: Diagnosis not present

## 2018-01-01 DIAGNOSIS — I10 Essential (primary) hypertension: Secondary | ICD-10-CM | POA: Diagnosis present

## 2018-01-01 DIAGNOSIS — R0602 Shortness of breath: Secondary | ICD-10-CM | POA: Diagnosis not present

## 2018-01-01 DIAGNOSIS — Z880 Allergy status to penicillin: Secondary | ICD-10-CM

## 2018-01-01 DIAGNOSIS — I2510 Atherosclerotic heart disease of native coronary artery without angina pectoris: Secondary | ICD-10-CM | POA: Diagnosis present

## 2018-01-01 DIAGNOSIS — N3000 Acute cystitis without hematuria: Secondary | ICD-10-CM

## 2018-01-01 DIAGNOSIS — R000 Tachycardia, unspecified: Secondary | ICD-10-CM | POA: Diagnosis not present

## 2018-01-01 DIAGNOSIS — R531 Weakness: Secondary | ICD-10-CM | POA: Diagnosis not present

## 2018-01-01 DIAGNOSIS — R7989 Other specified abnormal findings of blood chemistry: Secondary | ICD-10-CM | POA: Diagnosis present

## 2018-01-01 DIAGNOSIS — I130 Hypertensive heart and chronic kidney disease with heart failure and stage 1 through stage 4 chronic kidney disease, or unspecified chronic kidney disease: Secondary | ICD-10-CM | POA: Diagnosis not present

## 2018-01-01 LAB — COMPREHENSIVE METABOLIC PANEL
ALT: 10 U/L (ref 0–44)
AST: 18 U/L (ref 15–41)
Albumin: 3.3 g/dL — ABNORMAL LOW (ref 3.5–5.0)
Alkaline Phosphatase: 60 U/L (ref 38–126)
Anion gap: 8 (ref 5–15)
BUN: 13 mg/dL (ref 8–23)
CHLORIDE: 105 mmol/L (ref 98–111)
CO2: 25 mmol/L (ref 22–32)
Calcium: 9 mg/dL (ref 8.9–10.3)
Creatinine, Ser: 0.79 mg/dL (ref 0.44–1.00)
GFR calc Af Amer: >60 mL/min (ref >60)
GFR calc non Af Amer: >60 mL/min (ref >60)
Glucose, Bld: 95 mg/dL (ref 70–99)
Potassium: 3.8 mmol/L (ref 3.5–5.1)
Sodium: 138 mmol/L (ref 135–145)
Total Bilirubin: 0.2 mg/dL — ABNORMAL LOW (ref 0.3–1.2)
Total Protein: 6.6 g/dL (ref 6.5–8.1)

## 2018-01-01 LAB — URINALYSIS, ROUTINE W REFLEX MICROSCOPIC
Bilirubin Urine: NEGATIVE
Glucose, UA: NEGATIVE mg/dL
Hgb urine dipstick: NEGATIVE
Ketones, ur: NEGATIVE mg/dL
Nitrite: POSITIVE — AB
PROTEIN: NEGATIVE mg/dL
Specific Gravity, Urine: 1.014 (ref 1.005–1.030)
pH: 5 (ref 5.0–8.0)

## 2018-01-01 LAB — CBC
HCT: 37.7 % (ref 36.0–46.0)
Hemoglobin: 11.8 g/dL — ABNORMAL LOW (ref 12.0–15.0)
MCH: 29.9 pg (ref 26.0–34.0)
MCHC: 31.3 g/dL (ref 30.0–36.0)
MCV: 95.4 fL (ref 80.0–100.0)
NRBC: 0 % (ref 0.0–0.2)
Platelets: 177 10*3/uL (ref 150–400)
RBC: 3.95 MIL/uL (ref 3.87–5.11)
RDW: 13.7 % (ref 11.5–15.5)
WBC: 6.7 10*3/uL (ref 4.0–10.5)

## 2018-01-01 LAB — LIPASE, BLOOD: Lipase: 33 U/L (ref 11–51)

## 2018-01-01 LAB — TROPONIN I: TROPONIN I: 0.05 ng/mL — AB (ref <0.03)

## 2018-01-01 MED ORDER — SODIUM CHLORIDE 0.9 % IV BOLUS
500.0000 mL | Freq: Once | INTRAVENOUS | Status: AC
  Administered 2018-01-01: 500 mL via INTRAVENOUS

## 2018-01-01 MED ORDER — IOPAMIDOL (ISOVUE-300) INJECTION 61%
100.0000 mL | Freq: Once | INTRAVENOUS | Status: AC | PRN
  Administered 2018-01-01: 100 mL via INTRAVENOUS

## 2018-01-01 MED ORDER — SODIUM CHLORIDE 0.9 % IV SOLN
1.0000 g | Freq: Once | INTRAVENOUS | Status: AC
  Administered 2018-01-01: 1 g via INTRAVENOUS
  Filled 2018-01-01: qty 10

## 2018-01-01 NOTE — ED Triage Notes (Signed)
Per ems: pt coming from home c/o "5-10 minute bout of confusion." Was unable to recognize night time care giver. Currently A&OX4, ambulates with walker, lungs clear, and passed stroke screen. No fall, no blood thinners

## 2018-01-01 NOTE — ED Notes (Addendum)
Date and time results received: 01/01/18 9:22 PM  (use smartphrase ".now" to insert current time)  Test: troponin Critical Value: 0.05   Dr. Laverta Baltimore notified

## 2018-01-01 NOTE — ED Notes (Signed)
Patient transported to CT 

## 2018-01-01 NOTE — ED Notes (Signed)
Bed: EX51 Expected date:  Expected time:  Means of arrival:  Comments: EMS-altered/weak-84 y/o

## 2018-01-01 NOTE — ED Notes (Signed)
Patient transported to X-ray 

## 2018-01-01 NOTE — ED Provider Notes (Signed)
[facility] DEPT Provider Note  CSN: [id number] Arrival date & time: 01/01/18  1920  History   Chief Complaint No chief complaint on file.   HPI [name] is a 84 y.o. female.  Patient reports that last night she had an episode of confusion that lasted for about 5 minutes, her nighttime nurse and niece came in and she was unable to recognize either of them.  She has never had this before and does not have any history of confusion she does report that she has a history of issues.  Normally has shortness of breath however it has been getting worse the past couple of days.  She also reports that she has been having this left lower quadrant abdominal pain, decreased appetite, diarrhea, and some nausea for the past 2 to 3 days.  She denies any vomiting, dysuria, change in urinary frequency, coughing, fevers or chills.  Reports that she was started on Systane and Restasis for eye issues about 1 week ago, denies any other changes in medications.  She denies any recent sick contacts.     Past Medical History:  Diagnosis Date  . Allergic rhinitis   . CAD (coronary artery disease)   . CHF (congestive heart failure) ([place])   . CKD (chronic kidney disease) stage 3, GFR 30-59 ml/min (HCC) 09/12/2014  . Diverticular disease   . Diverticulosis   . DJD (degenerative joint disease) of knee   . DJD (degenerative joint disease) of lumbar spine    scoliosis  . Eczema   . Esophageal erosions    from nsaid   . GI bleed   . Hormone replacement therapy (postmenopausal)   . Hyperglycemia   . Hyperlipidemia   . Hypertension   . Pneumonia    july 2009  . Pulmonary hypertension ([place])    a. 2D echo 03/2008 showed normal biventricular thickness, chamber size and systolic function, EF 67-12%, mild mitral regurgitation, aortic sclerosis, moderate pulmonary HTN, pulmonic regurgitation.  Marland Kitchen PVC's (premature ventricular contractions)   . Reactive depression (situational)     . Sciatica    right lower extremity  . Shingles    right thoracic 2008  . Vertigo     Patient Active Problem List   Diagnosis Date Noted  . Acute encephalopathy 01/01/2018  . DJD (degenerative joint disease) 08/09/2016  . GI bleed 08/08/2016  . Abnormal echocardiogram 06/14/2016  . PVC's (premature ventricular contractions)   . Bacteremia 09/18/2014  . Fever   . Febrile illness 09/12/2014  . Diverticulitis 09/12/2014  . CKD (chronic kidney disease) stage 3, GFR 30-59 ml/min (HCC) 09/12/2014  . HTN (hypertension) 09/12/2014  . Thrombocytopenia ([place]) 09/12/2014  . Abnormal stress test 01/13/2014  . Rectal bleed 08/10/2013  . Acute renal failure ([place]) 08/10/2013  . Essential hypertension, benign 01/08/2013  . DOE (dyspnea on exertion) 01/08/2013    Past Surgical History:  Procedure Laterality Date  . APPENDECTOMY    . BACK SURGERY    . BREAST LUMPECTOMY    . FLEXIBLE SIGMOIDOSCOPY Left 08/13/2013   Procedure: FLEXIBLE SIGMOIDOSCOPY;  Surgeon: Arta Silence, MD;  Location: [facility];  Service: Endoscopy;  Laterality: Left;  . TOTAL ABDOMINAL HYSTERECTOMY       OB History   None      Home Medications    Prior to Admission medications   Medication Sig Start Date End Date Taking? Authorizing Provider  acetaminophen (TYLENOL) 500 MG tablet Take 500-1,000 mg by mouth every 6 (six) hours as needed  for moderate pain.    Yes [provider]  clotrimazole (LOTRIMIN) 1 % cream Apply 1 application topically 2 (two) times daily.    Yes [provider]  furosemide (LASIX) 40 MG tablet Take 0.5 tablets by mouth. Take 0.5 tablet by mouth Monday, Wednesday, Friday 09/10/16  Yes [provider]  isosorbide mononitrate (IMDUR) 30 MG 24 hr tablet Take 1 tablet (30 mg total) by mouth daily. 10/10/15  Yes Jettie Booze, MD  loperamide (IMODIUM) 2 MG capsule Take 1 capsule (2 mg total) by mouth as needed for diarrhea or loose stools. 08/12/16  Yes Domenic Polite, MD  metoprolol succinate (TOPROL-XL) 25 MG 24 hr tablet Take 25 mg by mouth 2 (two) times daily.   Yes [provider]  pantoprazole (PROTONIX) 40 MG tablet Take 40 mg by mouth daily as needed (indigestion).  11/24/17  Yes [provider]  Polyvinyl Alcohol-Povidone (REFRESH OP) Apply 1 drop to eye 2 (two) times daily.   Yes [provider]  potassium chloride SA (K-DUR,KLOR-CON) 20 MEQ tablet Take 20 mEq by mouth 2 (two) times daily.   Yes [provider]  PREMARIN 0.3 MG tablet Take 0.3 mg daily by mouth. 11/13/16  Yes [provider]  Prenatal Vit-Fe Fumarate-FA (PRENATAL PO) Take 1 tablet by mouth daily.   Yes [provider]  RESTASIS 0.05 % ophthalmic emulsion Place 1 drop into both eyes 2 (two) times daily.  12/22/17  Yes [provider]  meclizine (ANTIVERT) 25 MG tablet Take 25 mg by mouth 2 (two) times daily as needed for dizziness or nausea.     [provider]  polyethylene glycol (MIRALAX) packet Take 17 g by mouth daily as needed for mild constipation. 08/21/17   Jani Gravel, MD    Family History Family History  Problem Relation Age of Onset  . Heart disease Father   . Heart attack Father   . Heart disease Mother   . Heart attack Mother   . Heart disease Daughter   . Heart disease Son   . Cancer Brother   . Heart disease Sister   . Heart attack Sister        X2  . Heart attack Brother   . Hypertension Brother   . Stroke Neg Hx     Social History Social History   Tobacco Use  . Smoking status: Never Smoker  . Smokeless tobacco: Never Used  Substance Use Topics  . Alcohol use: Yes    Comment: occasional cocktail  . Drug use: No     Allergies   Ace inhibitors; Streptomycin; Tramadol; Vesicare [solifenacin succinate]; and Penicillins   Review of Systems Review of Systems  Constitutional: Positive for appetite change (decreased). Negative for chills, diaphoresis, fatigue and fever.   Respiratory: Positive for shortness of breath. Negative for cough and wheezing.   Cardiovascular: Negative for chest pain and palpitations.  Gastrointestinal: Positive for abdominal pain, diarrhea and nausea. Negative for abdominal distention, constipation and vomiting.  Genitourinary: Negative for decreased urine volume, dysuria, frequency and urgency.  Neurological: Negative for headaches.  All other systems reviewed and are negative.    Physical Exam Updated Vital Signs BP 136/88   Pulse 68   Temp (!) 97.4 F (36.3 C) (Oral)   Resp 15   Ht 5\' 1"  (1.549 m)   Wt 60.8 kg   SpO2 94%   BMI 25.33 kg/m   Physical Exam  Constitutional: She is oriented to person, place, and  time. She appears well-developed and well-nourished. No distress.  HENT:  Head: Normocephalic and atraumatic.  Eyes: Pupils are equal, round, and reactive to light. EOM are normal.  Neck: Neck supple.  Cardiovascular: Normal rate, regular rhythm and normal heart sounds.  Pulmonary/Chest: Effort normal and breath sounds normal. Stridor:   No respiratory distress. She has no wheezes. She exhibits no tenderness.  Abdominal: Soft. Bowel sounds are normal. There is tenderness (left lower quadrant). There is no rebound and no guarding.  Neurological: She is alert and oriented to person, place, and time.  Skin: Skin is warm and dry. Capillary refill takes less than 2 seconds. She is not diaphoretic.  Psychiatric: She has a normal mood and affect. Judgment normal.     ED Treatments / Results  Labs (all labs ordered are listed, but only abnormal results are displayed) Labs Reviewed  CBC - Abnormal; Notable for the following components:      Result Value   Hemoglobin 11.8 (*)    All other components within normal limits  COMPREHENSIVE METABOLIC PANEL - Abnormal; Notable for the following components:   Albumin 3.3 (*)    Total Bilirubin 0.2 (*)    All other components within normal limits  URINALYSIS, ROUTINE W  REFLEX MICROSCOPIC - Abnormal; Notable for the following components:   APPearance HAZY (*)    Nitrite POSITIVE (*)    Leukocytes, UA TRACE (*)    Bacteria, UA FEW (*)    All other components within normal limits  TROPONIN I - Abnormal; Notable for the following components:   Troponin I 0.05 (*)    All other components within normal limits  LIPASE, BLOOD  TROPONIN I    EKG None  Radiology Dg Chest 2 View  Result Date: 01/01/2018 CLINICAL DATA:  Per ems: pt coming from home c/o "5-10 minute bout of confusion." Was unable to recognize night time care giver. Currently AANDOX4, ambulates with walker, lungs clear, and passed stroke screen. EXAM: CHEST - 2 VIEW COMPARISON:  06/30/2017 FINDINGS: Heart size is normal. The lungs are free of focal consolidations and pleural effusions. Hiatal hernia is present. IMPRESSION: No evidence for acute cardiopulmonary abnormality. Electronically Signed   By: Nolon Nations M.D.   On: 01/01/2018 20:33   Ct Head Wo Contrast  Result Date: 01/01/2018 CLINICAL DATA:  Confusion for several minutes. EXAM: CT HEAD WITHOUT CONTRAST TECHNIQUE: Contiguous axial images were obtained from the base of the skull through the vertex without intravenous contrast. COMPARISON:  09/05/2016 FINDINGS: BRAIN: There is sulcal and ventricular prominence consistent with superficial and central atrophy. No intraparenchymal hemorrhage, mass effect nor midline shift. Periventricular and subcortical white matter hypodensities consistent with chronic small vessel ischemic disease are identified. No acute large vascular territory infarcts. No abnormal extra-axial fluid collections. Basal cisterns are not effaced and midline. VASCULAR: Moderate calcific atherosclerosis of the carotid siphons. SKULL: No skull fracture. No significant scalp soft tissue swelling. SINUSES/ORBITS: The mastoid air-cells are clear. The included paranasal sinuses are well-aerated.The included ocular globes and orbital  contents are non-suspicious. OTHER: None. IMPRESSION: Atrophy with chronic moderate small vessel ischemic disease. No acute intracranial abnormality. Electronically Signed   By: Ashley Royalty M.D.   On: 01/01/2018 23:15   Ct Abdomen Pelvis W Contrast  Result Date: 01/01/2018 CLINICAL DATA:  Abdominal pain earlier today. Diverticulitis suspected. EXAM: CT ABDOMEN AND PELVIS WITH CONTRAST TECHNIQUE: Multidetector CT imaging of the abdomen and pelvis was performed using the standard protocol following bolus administration of intravenous contrast.  CONTRAST:  17mL ISOVUE-300 IOPAMIDOL (ISOVUE-300) INJECTION 61% COMPARISON:  08/20/2017 CT FINDINGS: Lower chest: Large hiatal hernia is redemonstrated. Heart size is top normal without pericardial effusion. Dependent changes are noted at the lung bases. No effusion or pneumothorax. Previously noted left lower lobe pulmonary nodule is not included as part of this study. Hepatobiliary: No focal liver abnormality is seen. Distended gallbladder without mural thickening or stones again noted. No biliary dilatation. Pancreas: Atrophic pancreas without mass, ductal dilatation or inflammation. Spleen: Normal in size without focal abnormality. Adrenals/Urinary Tract: Normal bilateral adrenal glands and kidneys. No enhancing renal mass, obstructive uropathy or nephrolithiasis. The urinary bladder is normal in appearance without focal mural thickening or calculus. Stomach/Bowel: As mentioned, a large hiatal hernia is noted. The stomach is decompressed. Normal small bowel rotation without obstruction or inflammation. Rather extensive colonic diverticulosis 1 the descending and sigmoid colon without definite diverticulitis. Appendectomy by report. Vascular/Lymphatic: Atherosclerosis of the abdominal aorta without aneurysm. No pathologically enlarged lymph nodes within the abdomen pelvis. Reproductive: Status post hysterectomy. No adnexal masses. Other: No abdominal wall hernia or  abnormality. No abdominopelvic ascites. Musculoskeletal: Chronic thoracolumbar spondylosis with partial fusion across the T12 through L2 interspace. IMPRESSION: 1. Colonic diverticulosis without acute diverticulitis. No bowel obstruction or inflammation. 2. Large hiatal hernia. 3. Thoracolumbar spondylosis. Aortic Atherosclerosis (ICD10-I70.0). Electronically Signed   By: Ashley Royalty M.D.   On: 01/01/2018 23:23    Procedures Procedures (including critical care time)  Medications Ordered in ED Medications  cefTRIAXone (ROCEPHIN) 1 g in sodium chloride 0.9 % 100 mL IVPB (has no administration in time range)  sodium chloride 0.9 % bolus 500 mL (0 mLs Intravenous Stopped 01/01/18 2251)  iopamidol (ISOVUE-300) 61 % injection 100 mL (100 mLs Intravenous Contrast Given 01/01/18 2254)     Initial Impression / Assessment and Plan / ED Course  I have reviewed the triage vital signs and the nursing notes.  Pertinent labs & imaging results that were available during my care of the patient were reviewed by me and considered in my medical decision making (see chart for details).    This is a 84 year old female with history of her coronary artery disease, heart failure, CKD, hypertension PVCs who presented after a short episode of confusion.  She was unable to recognize her night time nurse or her niece. She also has been having increased shortness of breath, left lower quadrant abdominal pain, nausea, decreased appetite and diarrhea for 2-3 days. Her vitals showed that she was afebrile, HR 82, hypertensive to 180/86, oxygenating at 92% on RA. She did have tenderness to palpation over her left lower quadrant. Concern is for an infectious etiology of her confusion, possibly UTI vs diverticulitis vs pneumonia. CXR showed no acute findings. Urinalysis showed positive nitrites, trace leukocytes, 21-50 WBCs, and few bacteria. CBC and CMP were unremarkable except for a normocytic anemia, lipase was normal. Troponin was  elevated to 0.05. CT head showed no acute findings. CT abd/pelvis showed no acute diverticulitis.  Confusion was likely 2/2 UTI. Treated with rocephin and some fluids. Admitted for treatment of UTI.   Final Clinical Impressions(s) / ED Diagnoses   Final diagnoses:  Acute cystitis without hematuria  Transient alteration of awareness  Left lower quadrant abdominal pain    ED Discharge Orders    None       Asencion Noble, MD 01/01/18 2332    Margette Fast, MD 01/02/18 1558

## 2018-01-02 ENCOUNTER — Observation Stay (HOSPITAL_COMMUNITY): Payer: Medicare Other

## 2018-01-02 ENCOUNTER — Encounter (HOSPITAL_COMMUNITY): Payer: Self-pay | Admitting: Internal Medicine

## 2018-01-02 ENCOUNTER — Other Ambulatory Visit: Payer: Self-pay | Admitting: Interventional Cardiology

## 2018-01-02 DIAGNOSIS — D649 Anemia, unspecified: Secondary | ICD-10-CM | POA: Diagnosis present

## 2018-01-02 DIAGNOSIS — B9620 Unspecified Escherichia coli [E. coli] as the cause of diseases classified elsewhere: Secondary | ICD-10-CM | POA: Diagnosis present

## 2018-01-02 DIAGNOSIS — G9349 Other encephalopathy: Secondary | ICD-10-CM | POA: Diagnosis present

## 2018-01-02 DIAGNOSIS — N390 Urinary tract infection, site not specified: Secondary | ICD-10-CM | POA: Diagnosis not present

## 2018-01-02 DIAGNOSIS — E785 Hyperlipidemia, unspecified: Secondary | ICD-10-CM | POA: Diagnosis present

## 2018-01-02 DIAGNOSIS — I130 Hypertensive heart and chronic kidney disease with heart failure and stage 1 through stage 4 chronic kidney disease, or unspecified chronic kidney disease: Secondary | ICD-10-CM | POA: Diagnosis present

## 2018-01-02 DIAGNOSIS — R7989 Other specified abnormal findings of blood chemistry: Secondary | ICD-10-CM | POA: Diagnosis present

## 2018-01-02 DIAGNOSIS — I2510 Atherosclerotic heart disease of native coronary artery without angina pectoris: Secondary | ICD-10-CM | POA: Diagnosis present

## 2018-01-02 DIAGNOSIS — Z79899 Other long term (current) drug therapy: Secondary | ICD-10-CM | POA: Diagnosis not present

## 2018-01-02 DIAGNOSIS — N3000 Acute cystitis without hematuria: Secondary | ICD-10-CM | POA: Insufficient documentation

## 2018-01-02 DIAGNOSIS — I340 Nonrheumatic mitral (valve) insufficiency: Secondary | ICD-10-CM | POA: Diagnosis present

## 2018-01-02 DIAGNOSIS — I10 Essential (primary) hypertension: Secondary | ICD-10-CM | POA: Diagnosis not present

## 2018-01-02 DIAGNOSIS — I5022 Chronic systolic (congestive) heart failure: Secondary | ICD-10-CM | POA: Diagnosis present

## 2018-01-02 DIAGNOSIS — G9340 Encephalopathy, unspecified: Secondary | ICD-10-CM | POA: Diagnosis not present

## 2018-01-02 DIAGNOSIS — K5900 Constipation, unspecified: Secondary | ICD-10-CM | POA: Diagnosis not present

## 2018-01-02 DIAGNOSIS — Z880 Allergy status to penicillin: Secondary | ICD-10-CM | POA: Diagnosis not present

## 2018-01-02 DIAGNOSIS — N183 Chronic kidney disease, stage 3 (moderate): Secondary | ICD-10-CM | POA: Diagnosis present

## 2018-01-02 DIAGNOSIS — I700 Atherosclerosis of aorta: Secondary | ICD-10-CM | POA: Diagnosis present

## 2018-01-02 DIAGNOSIS — Z888 Allergy status to other drugs, medicaments and biological substances status: Secondary | ICD-10-CM | POA: Diagnosis not present

## 2018-01-02 DIAGNOSIS — R404 Transient alteration of awareness: Secondary | ICD-10-CM | POA: Diagnosis not present

## 2018-01-02 DIAGNOSIS — R109 Unspecified abdominal pain: Secondary | ICD-10-CM | POA: Diagnosis present

## 2018-01-02 DIAGNOSIS — Z881 Allergy status to other antibiotic agents status: Secondary | ICD-10-CM | POA: Diagnosis not present

## 2018-01-02 DIAGNOSIS — Z66 Do not resuscitate: Secondary | ICD-10-CM | POA: Diagnosis present

## 2018-01-02 LAB — BASIC METABOLIC PANEL
Anion gap: 7 (ref 5–15)
BUN: 13 mg/dL (ref 8–23)
CO2: 24 mmol/L (ref 22–32)
CREATININE: 0.78 mg/dL (ref 0.44–1.00)
Calcium: 8.6 mg/dL — ABNORMAL LOW (ref 8.9–10.3)
Chloride: 106 mmol/L (ref 98–111)
GFR calc non Af Amer: >60 mL/min (ref >60)
Glucose, Bld: 90 mg/dL (ref 70–99)
Potassium: 3.4 mmol/L — ABNORMAL LOW (ref 3.5–5.1)
Sodium: 137 mmol/L (ref 135–145)

## 2018-01-02 LAB — CBC
HCT: 36.7 % (ref 36.0–46.0)
Hemoglobin: 11.6 g/dL — ABNORMAL LOW (ref 12.0–15.0)
MCH: 30.3 pg (ref 26.0–34.0)
MCHC: 31.6 g/dL (ref 30.0–36.0)
MCV: 95.8 fL (ref 80.0–100.0)
PLATELETS: 154 10*3/uL (ref 150–400)
RBC: 3.83 MIL/uL — ABNORMAL LOW (ref 3.87–5.11)
RDW: 13.7 % (ref 11.5–15.5)
WBC: 6.2 10*3/uL (ref 4.0–10.5)
nRBC: 0 % (ref 0.0–0.2)

## 2018-01-02 LAB — TROPONIN I: Troponin I: 0.04 ng/mL (ref <0.03)

## 2018-01-02 MED ORDER — FUROSEMIDE 20 MG PO TABS
20.0000 mg | ORAL_TABLET | ORAL | Status: DC
  Administered 2018-01-02: 10:00:00 via ORAL
  Filled 2018-01-02: qty 1

## 2018-01-02 MED ORDER — POTASSIUM CHLORIDE CRYS ER 20 MEQ PO TBCR
20.0000 meq | EXTENDED_RELEASE_TABLET | Freq: Two times a day (BID) | ORAL | Status: DC
  Administered 2018-01-02 – 2018-01-04 (×5): 20 meq via ORAL
  Filled 2018-01-02 (×5): qty 1

## 2018-01-02 MED ORDER — ISOSORBIDE MONONITRATE ER 30 MG PO TB24
30.0000 mg | ORAL_TABLET | Freq: Every day | ORAL | Status: DC
  Administered 2018-01-02 – 2018-01-04 (×3): 30 mg via ORAL
  Filled 2018-01-02 (×3): qty 1

## 2018-01-02 MED ORDER — ACETAMINOPHEN 325 MG PO TABS
650.0000 mg | ORAL_TABLET | Freq: Four times a day (QID) | ORAL | Status: DC | PRN
  Administered 2018-01-03: 650 mg via ORAL
  Filled 2018-01-02: qty 2

## 2018-01-02 MED ORDER — POLYETHYLENE GLYCOL 3350 17 G PO PACK: 17.0000 g | PACK | Freq: Every day | ORAL | Status: DC | PRN

## 2018-01-02 MED ORDER — PRENATAL MULTIVITAMIN CH
1.0000 | ORAL_TABLET | Freq: Every day | ORAL | Status: DC
  Administered 2018-01-02 – 2018-01-04 (×3): 1 via ORAL
  Filled 2018-01-02 (×4): qty 1

## 2018-01-02 MED ORDER — PANTOPRAZOLE SODIUM 40 MG PO TBEC: 40.0000 mg | DELAYED_RELEASE_TABLET | Freq: Every day | ORAL | Status: DC | PRN

## 2018-01-02 MED ORDER — MECLIZINE HCL 25 MG PO TABS: 25.0000 mg | ORAL_TABLET | Freq: Two times a day (BID) | ORAL | Status: DC | PRN

## 2018-01-02 MED ORDER — ACETAMINOPHEN 650 MG RE SUPP: 650.0000 mg | Freq: Four times a day (QID) | RECTAL | Status: DC | PRN

## 2018-01-02 MED ORDER — ONDANSETRON HCL 4 MG PO TABS: 4.0000 mg | ORAL_TABLET | Freq: Four times a day (QID) | ORAL | Status: DC | PRN

## 2018-01-02 MED ORDER — METOPROLOL SUCCINATE ER 25 MG PO TB24
25.0000 mg | ORAL_TABLET | Freq: Two times a day (BID) | ORAL | Status: DC
  Administered 2018-01-02 – 2018-01-04 (×5): 25 mg via ORAL
  Filled 2018-01-02 (×5): qty 1

## 2018-01-02 MED ORDER — ONDANSETRON HCL 4 MG/2ML IJ SOLN: 4.0000 mg | Freq: Four times a day (QID) | INTRAMUSCULAR | Status: DC | PRN

## 2018-01-02 MED ORDER — SODIUM CHLORIDE 0.9 % IV SOLN
1.0000 g | INTRAVENOUS | Status: DC
  Administered 2018-01-02 – 2018-01-03 (×2): 1 g via INTRAVENOUS
  Filled 2018-01-02: qty 10
  Filled 2018-01-02 (×2): qty 1

## 2018-01-02 MED ORDER — ENOXAPARIN SODIUM 40 MG/0.4ML ~~LOC~~ SOLN
40.0000 mg | SUBCUTANEOUS | Status: DC
  Administered 2018-01-02 – 2018-01-03 (×2): 40 mg via SUBCUTANEOUS
  Filled 2018-01-02 (×2): qty 0.4

## 2018-01-02 NOTE — ED Notes (Signed)
Patient ambulated to the bathroom with 2 person assist.

## 2018-01-02 NOTE — Progress Notes (Signed)
ED TO INPATIENT HANDOFF REPORT  Name/Age/Gender [name] 84 y.o. female  Code Status    Code Status Orders  (From admission, onward)         Start     Ordered   01/02/18 0246  Do not attempt resuscitation (DNR)  Continuous    Question Answer Comment  In the event of cardiac or respiratory ARREST Do not call a "code blue"   In the event of cardiac or respiratory ARREST Do not perform Intubation, CPR, defibrillation or ACLS   In the event of cardiac or respiratory ARREST Use medication by any route, position, wound care, and other measures to relive pain and suffering. May use oxygen, suction and manual treatment of airway obstruction as needed for comfort.      01/02/18 0246        Code Status History    Date Active Date Inactive Code Status Order ID Comments User Context   08/20/2017 1528 08/21/2017 1913 DNR [id number]  Debbe Odea, MD ED   08/08/2016 1804 08/12/2016 2007 Full Code [id number]  Tawni Millers, MD ED   09/18/2014 1301 09/20/2014 2137 Full Code [id number]  Theodis Blaze, MD Inpatient   09/12/2014 0730 09/14/2014 1422 DNR [id number]  Caren Griffins, MD ED   08/10/2013 1750 08/14/2013 1535 Full Code [id number]  Robbie Lis, MD Inpatient      Home/SNF/Other Home  Chief Complaint ams  Level of Care/Admitting Diagnosis ED Disposition    ED Disposition Condition Comment   [place]: [facility] [[id number]]  Level of Care: Telemetry [5]  Admit to tele based on following criteria: Monitor for Ischemic changes  Diagnosis: Acute encephalopathy [824235]  Admitting Physician: Rise Patience [phone]  Attending Physician: Rise Patience [phone]  PT Class (Do Not Modify): Observation [104]  PT Acc Code (Do Not Modify): Observation [10022]       Medical History Past Medical History:  Diagnosis Date  . Allergic rhinitis   . CAD (coronary artery disease)   . CHF (congestive heart failure) ([place])   . CKD  (chronic kidney disease) stage 3, GFR 30-59 ml/min (HCC) 09/12/2014  . Diverticular disease   . Diverticulosis   . DJD (degenerative joint disease) of knee   . DJD (degenerative joint disease) of lumbar spine    scoliosis  . Eczema   . Esophageal erosions    from nsaid   . GI bleed   . Hormone replacement therapy (postmenopausal)   . Hyperglycemia   . Hyperlipidemia   . Hypertension   . Pneumonia    july 2009  . Pulmonary hypertension ([place])    a. 2D echo 03/2008 showed normal biventricular thickness, chamber size and systolic function, EF 40-08%, mild mitral regurgitation, aortic sclerosis, moderate pulmonary HTN, pulmonic regurgitation.  Marland Kitchen PVC's (premature ventricular contractions)   . Reactive depression (situational)   . Sciatica    right lower extremity  . Shingles    right thoracic 2008  . Vertigo     Allergies Allergies  Allergen Reactions  . Ace Inhibitors Hives and Itching  . Streptomycin Other (See Comments) and Itching    Pruritus Pruritus   . Tramadol Other (See Comments)    unknown  . Vesicare [Solifenacin Succinate] Itching  . Penicillins Rash    Pruritic rash Has patient had a PCN reaction causing immediate rash, facial/tongue/throat swelling, SOB or lightheadedness with hypotension: unknown Has patient had a PCN reaction causing severe rash involving mucus membranes  or skin necrosis: unknown Has patient had a PCN reaction that required hospitalization:unknown Has patient had a PCN reaction occurring within the last 10 years: yes If all of the above answers are "NO", then may proceed with Cephalosporin use.     IV Location/Drains/Wounds Patient Lines/Drains/Airways Status   Active Line/Drains/Airways    Name:   Placement date:   Placement time:   Site:   Days:   Peripheral IV 01/01/18 Left Forearm   01/01/18    1929    Forearm   1          Labs/Imaging Results for orders placed or performed during the hospital encounter of 01/01/18 (from the past  48 hour(s))  CBC     Status: Abnormal   Collection Time: 01/01/18  8:35 PM  Result Value Ref Range   WBC 6.7 4.0 - 10.5 K/uL   RBC 3.95 3.87 - 5.11 MIL/uL   Hemoglobin 11.8 (L) 12.0 - 15.0 g/dL   HCT 37.7 36.0 - 46.0 %   MCV 95.4 80.0 - 100.0 fL   MCH 29.9 26.0 - 34.0 pg   MCHC 31.3 30.0 - 36.0 g/dL   RDW 13.7 11.5 - 15.5 %   Platelets 177 150 - 400 K/uL   nRBC 0.0 0.0 - 0.2 %    Comment: Performed at [facility], [place] [address] [place], [place] [address]  Comprehensive metabolic panel     Status: Abnormal   Collection Time: 01/01/18  8:35 PM  Result Value Ref Range   Sodium 138 135 - 145 mmol/L   Potassium 3.8 3.5 - 5.1 mmol/L   Chloride 105 98 - 111 mmol/L   CO2 25 22 - 32 mmol/L   Glucose, Bld 95 70 - 99 mg/dL   BUN 13 8 - 23 mg/dL   Creatinine, Ser 0.79 0.44 - 1.00 mg/dL   Calcium 9.0 8.9 - 10.3 mg/dL   Total Protein 6.6 6.5 - 8.1 g/dL   Albumin 3.3 (L) 3.5 - 5.0 g/dL   AST 18 15 - 41 U/L   ALT 10 0 - 44 U/L   Alkaline Phosphatase 60 38 - 126 U/L   Total Bilirubin 0.2 (L) 0.3 - 1.2 mg/dL   GFR calc non Af Amer >60 >60 mL/min   GFR calc Af Amer >60 >60 mL/min   Anion gap 8 5 - 15    Comment: Performed at [facility], [place] [address], [place], [place] [address]  Lipase, blood     Status: None   Collection Time: 01/01/18  8:35 PM  Result Value Ref Range   Lipase 33 11 - 51 U/L    Comment: Performed at [facility], [place] [address] [place], [place] [address]  Troponin I - Once     Status: Abnormal   Collection Time: 01/01/18  8:35 PM  Result Value Ref Range   Troponin I 0.05 (HH) <0.03 ng/mL    Comment: CRITICAL RESULT CALLED TO, READ BACK BY AND VERIFIED WITH: A. VALEU AT 2122 ON 01/01/18 BY N.THOMPSON Performed at [facility], [place] [address] [place], [place] [address]   Urinalysis, Routine w reflex microscopic     Status: Abnormal   Collection Time: 01/01/18  9:54 PM  Result Value Ref  Range   Color, Urine YELLOW YELLOW   APPearance HAZY (A) CLEAR   Specific Gravity, Urine 1.014 1.005 - 1.030   pH 5.0 5.0 - 8.0   Glucose, UA NEGATIVE NEGATIVE  mg/dL   Hgb urine dipstick NEGATIVE NEGATIVE   Bilirubin Urine NEGATIVE NEGATIVE   Ketones, ur NEGATIVE NEGATIVE mg/dL   Protein, ur NEGATIVE NEGATIVE mg/dL   Nitrite POSITIVE (A) NEGATIVE   Leukocytes, UA TRACE (A) NEGATIVE   RBC / HPF 0-5 0 - 5 RBC/hpf   WBC, UA 21-50 0 - 5 WBC/hpf   Bacteria, UA FEW (A) NONE SEEN   Squamous Epithelial / LPF 6-10 0 - 5   Mucus PRESENT     Comment: Performed at [facility], [place] [address] [place], [place] [address]  Troponin I - Once     Status: Abnormal   Collection Time: 01/02/18 12:11 AM  Result Value Ref Range   Troponin I 0.04 (HH) <0.03 ng/mL    Comment: CRITICAL VALUE NOTED.  VALUE IS CONSISTENT WITH PREVIOUSLY REPORTED AND CALLED VALUE. Performed at [facility], [place] [address], [place], [place] [address]   Basic metabolic panel     Status: Abnormal   Collection Time: 01/02/18  5:00 AM  Result Value Ref Range   Sodium 137 135 - 145 mmol/L   Potassium 3.4 (L) 3.5 - 5.1 mmol/L   Chloride 106 98 - 111 mmol/L   CO2 24 22 - 32 mmol/L   Glucose, Bld 90 70 - 99 mg/dL   BUN 13 8 - 23 mg/dL   Creatinine, Ser 0.78 0.44 - 1.00 mg/dL   Calcium 8.6 (L) 8.9 - 10.3 mg/dL   GFR calc non Af Amer >60 >60 mL/min   GFR calc Af Amer >60 >60 mL/min   Anion gap 7 5 - 15    Comment: Performed at [facility], [place] [address], [place], [place] [address]  CBC     Status: Abnormal   Collection Time: 01/02/18  5:00 AM  Result Value Ref Range   WBC 6.2 4.0 - 10.5 K/uL   RBC 3.83 (L) 3.87 - 5.11 MIL/uL   Hemoglobin 11.6 (L) 12.0 - 15.0 g/dL   HCT 36.7 36.0 - 46.0 %   MCV 95.8 80.0 - 100.0 fL   MCH 30.3 26.0 - 34.0 pg   MCHC 31.6 30.0 - 36.0 g/dL   RDW 13.7 11.5 - 15.5 %   Platelets 154 150 - 400 K/uL   nRBC 0.0 0.0 - 0.2 %    Comment:  Performed at [facility], [place] [address] [place], [place] [address]   Dg Chest 2 View  Result Date: 01/01/2018 CLINICAL DATA:  Per ems: pt coming from home c/o "5-10 minute bout of confusion." Was unable to recognize night time care giver. Currently AANDOX4, ambulates with walker, lungs clear, and passed stroke screen. EXAM: CHEST - 2 VIEW COMPARISON:  06/30/2017 FINDINGS: Heart size is normal. The lungs are free of focal consolidations and pleural effusions. Hiatal hernia is present. IMPRESSION: No evidence for acute cardiopulmonary abnormality. Electronically Signed   By: Nolon Nations M.D.   On: 01/01/2018 20:33   Ct Head Wo Contrast  Result Date: 01/01/2018 CLINICAL DATA:  Confusion for several minutes. EXAM: CT HEAD WITHOUT CONTRAST TECHNIQUE: Contiguous axial images were obtained from the base of the skull through the vertex without intravenous contrast. COMPARISON:  09/05/2016 FINDINGS: BRAIN: There is sulcal and ventricular prominence consistent with superficial and central atrophy. No intraparenchymal hemorrhage, mass effect nor midline shift. Periventricular and subcortical white matter hypodensities consistent with chronic small vessel ischemic disease are identified. No acute large vascular territory infarcts. No abnormal extra-axial fluid  collections. Basal cisterns are not effaced and midline. VASCULAR: Moderate calcific atherosclerosis of the carotid siphons. SKULL: No skull fracture. No significant scalp soft tissue swelling. SINUSES/ORBITS: The mastoid air-cells are clear. The included paranasal sinuses are well-aerated.The included ocular globes and orbital contents are non-suspicious. OTHER: None. IMPRESSION: Atrophy with chronic moderate small vessel ischemic disease. No acute intracranial abnormality. Electronically Signed   By: Ashley Royalty M.D.   On: 01/01/2018 23:15   Ct Abdomen Pelvis W Contrast  Result Date: 01/01/2018 CLINICAL DATA:  Abdominal pain  earlier today. Diverticulitis suspected. EXAM: CT ABDOMEN AND PELVIS WITH CONTRAST TECHNIQUE: Multidetector CT imaging of the abdomen and pelvis was performed using the standard protocol following bolus administration of intravenous contrast. CONTRAST:  147mL ISOVUE-300 IOPAMIDOL (ISOVUE-300) INJECTION 61% COMPARISON:  08/20/2017 CT FINDINGS: Lower chest: Large hiatal hernia is redemonstrated. Heart size is top normal without pericardial effusion. Dependent changes are noted at the lung bases. No effusion or pneumothorax. Previously noted left lower lobe pulmonary nodule is not included as part of this study. Hepatobiliary: No focal liver abnormality is seen. Distended gallbladder without mural thickening or stones again noted. No biliary dilatation. Pancreas: Atrophic pancreas without mass, ductal dilatation or inflammation. Spleen: Normal in size without focal abnormality. Adrenals/Urinary Tract: Normal bilateral adrenal glands and kidneys. No enhancing renal mass, obstructive uropathy or nephrolithiasis. The urinary bladder is normal in appearance without focal mural thickening or calculus. Stomach/Bowel: As mentioned, a large hiatal hernia is noted. The stomach is decompressed. Normal small bowel rotation without obstruction or inflammation. Rather extensive colonic diverticulosis 1 the descending and sigmoid colon without definite diverticulitis. Appendectomy by report. Vascular/Lymphatic: Atherosclerosis of the abdominal aorta without aneurysm. No pathologically enlarged lymph nodes within the abdomen pelvis. Reproductive: Status post hysterectomy. No adnexal masses. Other: No abdominal wall hernia or abnormality. No abdominopelvic ascites. Musculoskeletal: Chronic thoracolumbar spondylosis with partial fusion across the T12 through L2 interspace. IMPRESSION: 1. Colonic diverticulosis without acute diverticulitis. No bowel obstruction or inflammation. 2. Large hiatal hernia. 3. Thoracolumbar spondylosis.  Aortic Atherosclerosis (ICD10-I70.0). Electronically Signed   By: Ashley Royalty M.D.   On: 01/01/2018 23:23   Dg Abd 2 Views  Result Date: 01/02/2018 CLINICAL DATA:  84 year old female with constipation and abdominal pain EXAM: ABDOMEN - 2 VIEW COMPARISON:  CT 01/01/2018 FINDINGS: Gas within stomach, small bowel, colon. No abnormal distention. Retained enteric contrast within the urinary bladder. No unexpected radiopaque foreign body. No unexpected soft tissue density. Overlying EKG leads. Degenerative changes of the spine. Degenerative changes of the hips IMPRESSION: Nonobstructive bowel gas pattern. Electronically Signed   By: Corrie Mckusick D.O.   On: 01/02/2018 12:14    Pending Labs Unresulted Labs (From admission, onward)    Start     Ordered   01/03/18 0500  CBC with Differential/Platelet  Daily,   R     01/02/18 1156   01/03/18 8416  Basic metabolic panel  Daily,   R     01/02/18 1156   01/02/18 0759  Urine Culture  ONCE - STAT,   R     01/02/18 0758          Vitals/Pain Today's Vitals   01/02/18 1238 01/02/18 1300 01/02/18 1312 01/02/18 1330  BP: 103/70  120/73   Pulse: 64 68 67 66  Resp: (!) 25 17 17  (!) 21  Temp:      TempSrc:      SpO2: 97% 97% 95% 94%  Weight:      Height:  PainSc:        Isolation Precautions No active isolations  Medications Medications  furosemide (LASIX) tablet 20 mg ( Oral Given 01/02/18 0940)  isosorbide mononitrate (IMDUR) 24 hr tablet 30 mg (30 mg Oral Given 01/02/18 0942)  metoprolol succinate (TOPROL-XL) 24 hr tablet 25 mg (25 mg Oral Given 01/02/18 0941)  meclizine (ANTIVERT) tablet 25 mg (has no administration in time range)  pantoprazole (PROTONIX) EC tablet 40 mg (has no administration in time range)  polyethylene glycol (MIRALAX / GLYCOLAX) packet 17 g (has no administration in time range)  potassium chloride SA (K-DUR,KLOR-CON) CR tablet 20 mEq (20 mEq Oral Given 01/02/18 0939)  prenatal multivitamin tablet 1 tablet (1 tablet  Oral Given 01/02/18 1253)  acetaminophen (TYLENOL) tablet 650 mg (has no administration in time range)    Or  acetaminophen (TYLENOL) suppository 650 mg (has no administration in time range)  ondansetron (ZOFRAN) tablet 4 mg (has no administration in time range)    Or  ondansetron (ZOFRAN) injection 4 mg (has no administration in time range)  enoxaparin (LOVENOX) injection 40 mg (40 mg Subcutaneous Given 01/02/18 0942)  cefTRIAXone (ROCEPHIN) 1 g in sodium chloride 0.9 % 100 mL IVPB (has no administration in time range)  sodium chloride 0.9 % bolus 500 mL (0 mLs Intravenous Stopped 01/01/18 2251)  iopamidol (ISOVUE-300) 61 % injection 100 mL (100 mLs Intravenous Contrast Given 01/01/18 2254)  cefTRIAXone (ROCEPHIN) 1 g in sodium chloride 0.9 % 100 mL IVPB (0 g Intravenous Stopped 01/02/18 0019)    Mobility walks with person assist

## 2018-01-02 NOTE — H&P (Signed)
History and Physical    [name] UVO:[id number] DOB: 1916-10-29 DOA: 01/01/2018  PCP: Josetta Huddle, MD  Patient coming from: [place].  Chief Complaint: Confusion and abdominal pain.  HPI: [name] is a 84 y.o. female with history of hypertension, CHF, chronic kidney disease who was recently admitted in July for GI bleed was found to be acutely confused which lasted for around 5 minutes at the nursing facility unable to recognize of staff.  Patient also had complained of left lower quadrant abdominal pain.  Patient was referred to the ER.  ED Course: In the ER patient was back to her baseline alert awake and oriented to her name and place.  Moves all extremities.  Had complained of left lower quadrant pain patient had CT head and abdomen which did not show to the acute.  UA was compatible with UTI.  Troponin is mildly elevated and has been chronically elevated denies any chest pain.  Patient was started on antibiotics for UTI and admitted for further observation.  Review of Systems: As per HPI, rest all negative.   Past Medical History:  Diagnosis Date  . Allergic rhinitis   . CAD (coronary artery disease)   . CHF (congestive heart failure) ([place])   . CKD (chronic kidney disease) stage 3, GFR 30-59 ml/min (HCC) 09/12/2014  . Diverticular disease   . Diverticulosis   . DJD (degenerative joint disease) of knee   . DJD (degenerative joint disease) of lumbar spine    scoliosis  . Eczema   . Esophageal erosions    from nsaid   . GI bleed   . Hormone replacement therapy (postmenopausal)   . Hyperglycemia   . Hyperlipidemia   . Hypertension   . Pneumonia    july 2009  . Pulmonary hypertension ([place])    a. 2D echo 03/2008 showed normal biventricular thickness, chamber size and systolic function, EF 74-25%, mild mitral regurgitation, aortic sclerosis, moderate pulmonary HTN, pulmonic regurgitation.  Marland Kitchen PVC's (premature ventricular contractions)   .  Reactive depression (situational)   . Sciatica    right lower extremity  . Shingles    right thoracic 2008  . Vertigo     Past Surgical History:  Procedure Laterality Date  . APPENDECTOMY    . BACK SURGERY    . BREAST LUMPECTOMY    . FLEXIBLE SIGMOIDOSCOPY Left 08/13/2013   Procedure: FLEXIBLE SIGMOIDOSCOPY;  Surgeon: Arta Silence, MD;  Location: [facility];  Service: Endoscopy;  Laterality: Left;  . TOTAL ABDOMINAL HYSTERECTOMY       reports that she has never smoked. She has never used smokeless tobacco. She reports that she drinks alcohol. She reports that she does not use drugs.  Allergies  Allergen Reactions  . Ace Inhibitors Hives and Itching  . Streptomycin Other (See Comments) and Itching    Pruritus Pruritus   . Tramadol Other (See Comments)    unknown  . Vesicare [Solifenacin Succinate] Itching  . Penicillins Rash    Pruritic rash Has patient had a PCN reaction causing immediate rash, facial/tongue/throat swelling, SOB or lightheadedness with hypotension: unknown Has patient had a PCN reaction causing severe rash involving mucus membranes or skin necrosis: unknown Has patient had a PCN reaction that required hospitalization:unknown Has patient had a PCN reaction occurring within the last 10 years: yes If all of the above answers are "NO", then may proceed with Cephalosporin use.     Family History  Problem Relation Age of Onset  .  Heart disease Father   . Heart attack Father   . Heart disease Mother   . Heart attack Mother   . Heart disease Daughter   . Heart disease Son   . Cancer Brother   . Heart disease Sister   . Heart attack Sister        X2  . Heart attack Brother   . Hypertension Brother   . Stroke Neg Hx     Prior to Admission medications   Medication Sig Start Date End Date Taking? Authorizing Provider  acetaminophen (TYLENOL) 500 MG tablet Take 500-1,000 mg by mouth every 6 (six) hours as needed for moderate pain.    Yes [provider]  clotrimazole (LOTRIMIN) 1 % cream Apply 1 application topically 2 (two) times daily.    Yes [provider]  furosemide (LASIX) 40 MG tablet Take 0.5 tablets by mouth. Take 0.5 tablet by mouth Monday, Wednesday, Friday 09/10/16  Yes [provider]  isosorbide mononitrate (IMDUR) 30 MG 24 hr tablet Take 1 tablet (30 mg total) by mouth daily. 10/10/15  Yes Jettie Booze, MD  loperamide (IMODIUM) 2 MG capsule Take 1 capsule (2 mg total) by mouth as needed for diarrhea or loose stools. 08/12/16  Yes Domenic Polite, MD  metoprolol succinate (TOPROL-XL) 25 MG 24 hr tablet Take 25 mg by mouth 2 (two) times daily.   Yes [provider]  pantoprazole (PROTONIX) 40 MG tablet Take 40 mg by mouth daily as needed (indigestion).  11/24/17  Yes [provider]  Polyvinyl Alcohol-Povidone (REFRESH OP) Apply 1 drop to eye 2 (two) times daily.   Yes [provider]  potassium chloride SA (K-DUR,KLOR-CON) 20 MEQ tablet Take 20 mEq by mouth 2 (two) times daily.   Yes [provider]  PREMARIN 0.3 MG tablet Take 0.3 mg daily by mouth. 11/13/16  Yes [provider]  Prenatal Vit-Fe Fumarate-FA (PRENATAL PO) Take 1 tablet by mouth daily.   Yes [provider]  RESTASIS 0.05 % ophthalmic emulsion Place 1 drop into both eyes 2 (two) times daily.  12/22/17  Yes [provider]  meclizine (ANTIVERT) 25 MG tablet Take 25 mg by mouth 2 (two) times daily as needed for dizziness or nausea.     [provider]  polyethylene glycol (MIRALAX) packet Take 17 g by mouth daily as needed for mild constipation. 08/21/17   Jani Gravel, MD    Physical Exam: Vitals:   01/01/18 1929 01/01/18 1930 01/01/18 2158 01/02/18 0021  BP: (!) 144/63  136/88 (!) 135/93  Pulse: 64  68 68  Resp: 18  15 (!) 23  Temp: (!) 97.4 F (36.3 C)     TempSrc: Oral     SpO2: 97%  94% 94%  Weight:  60.8 kg    Height:  5\' 1"  (1.549 m)         Constitutional: Moderately built and nourished. Vitals:   01/01/18 1929 01/01/18 1930 01/01/18 2158 01/02/18 0021  BP: (!) 144/63  136/88 (!) 135/93  Pulse: 64  68 68  Resp: 18  15 (!) 23  Temp: (!) 97.4 F (36.3 C)     TempSrc: Oral     SpO2: 97%  94% 94%  Weight:  60.8 kg    Height:  5\' 1"  (1.549 m)     Eyes: Anicteric no pallor. ENMT: No discharge from the ears eyes nose or mouth. Neck: No mass or.  No neck rigidity. Respiratory: No rhonchi or  crepitations. Cardiovascular: S1-S2 heard. Abdomen: Soft nontender bowel sounds present. Musculoskeletal: No edema. Skin: No rash. Neurologic: Alert awake oriented to name and place moves all extremities. Psychiatric: Oriented to name and place.   Labs on Admission: I have personally reviewed following labs and imaging studies  CBC: Recent Labs  Lab 01/01/18 2035  WBC 6.7  HGB 11.8*  HCT 37.7  MCV 95.4  PLT 962   Basic Metabolic Panel: Recent Labs  Lab 01/01/18 2035  NA 138  K 3.8  CL 105  CO2 25  GLUCOSE 95  BUN 13  CREATININE 0.79  CALCIUM 9.0   GFR: Estimated Creatinine Clearance: 32.1 mL/min (by C-G formula based on SCr of 0.79 mg/dL). Liver Function Tests: Recent Labs  Lab 01/01/18 2035  AST 18  ALT 10  ALKPHOS 60  BILITOT 0.2*  PROT 6.6  ALBUMIN 3.3*   Recent Labs  Lab 01/01/18 2035  LIPASE 33   No results for input(s): AMMONIA in the last 168 hours. Coagulation Profile: No results for input(s): INR, PROTIME in the last 168 hours. Cardiac Enzymes: Recent Labs  Lab 01/01/18 2035 01/02/18 0011  TROPONINI 0.05* 0.04*   BNP (last 3 results) No results for input(s): PROBNP in the last 8760 hours. HbA1C: No results for input(s): HGBA1C in the last 72 hours. CBG: No results for input(s): GLUCAP in the last 168 hours. Lipid Profile: No results for input(s): CHOL, HDL, LDLCALC, TRIG, CHOLHDL, LDLDIRECT in the last 72 hours. Thyroid Function Tests: No results for input(s): TSH,  T4TOTAL, FREET4, T3FREE, THYROIDAB in the last 72 hours. Anemia Panel: No results for input(s): VITAMINB12, FOLATE, FERRITIN, TIBC, IRON, RETICCTPCT in the last 72 hours. Urine analysis:    Component Value Date/Time   COLORURINE YELLOW 01/01/2018 2154   APPEARANCEUR HAZY (A) 01/01/2018 2154   LABSPEC 1.014 01/01/2018 2154   PHURINE 5.0 01/01/2018 2154   GLUCOSEU NEGATIVE 01/01/2018 2154   HGBUR NEGATIVE 01/01/2018 2154   BILIRUBINUR NEGATIVE 01/01/2018 2154   [place] 01/01/2018 2154   PROTEINUR NEGATIVE 01/01/2018 2154   UROBILINOGEN 0.2 09/18/2014 1600   NITRITE POSITIVE (A) 01/01/2018 2154   LEUKOCYTESUR TRACE (A) 01/01/2018 2154   Sepsis Labs: @LABRCNTIP (procalcitonin:4,lacticidven:4) )No results found for this or any previous visit (from the past 240 hour(s)).   Radiological Exams on Admission: Dg Chest 2 View  Result Date: 01/01/2018 CLINICAL DATA:  Per ems: pt coming from home c/o "5-10 minute bout of confusion." Was unable to recognize night time care giver. Currently AANDOX4, ambulates with walker, lungs clear, and passed stroke screen. EXAM: CHEST - 2 VIEW COMPARISON:  06/30/2017 FINDINGS: Heart size is normal. The lungs are free of focal consolidations and pleural effusions. Hiatal hernia is present. IMPRESSION: No evidence for acute cardiopulmonary abnormality. Electronically Signed   By: Nolon Nations M.D.   On: 01/01/2018 20:33   Ct Head Wo Contrast  Result Date: 01/01/2018 CLINICAL DATA:  Confusion for several minutes. EXAM: CT HEAD WITHOUT CONTRAST TECHNIQUE: Contiguous axial images were obtained from the base of the skull through the vertex without intravenous contrast. COMPARISON:  09/05/2016 FINDINGS: BRAIN: There is sulcal and ventricular prominence consistent with superficial and central atrophy. No intraparenchymal hemorrhage, mass effect nor midline shift. Periventricular and subcortical white matter hypodensities consistent with chronic small vessel  ischemic disease are identified. No acute large vascular territory infarcts. No abnormal extra-axial fluid collections. Basal cisterns are not effaced and midline. VASCULAR: Moderate calcific atherosclerosis of the carotid siphons. SKULL: No skull fracture. No significant  scalp soft tissue swelling. SINUSES/ORBITS: The mastoid air-cells are clear. The included paranasal sinuses are well-aerated.The included ocular globes and orbital contents are non-suspicious. OTHER: None. IMPRESSION: Atrophy with chronic moderate small vessel ischemic disease. No acute intracranial abnormality. Electronically Signed   By: Ashley Royalty M.D.   On: 01/01/2018 23:15   Ct Abdomen Pelvis W Contrast  Result Date: 01/01/2018 CLINICAL DATA:  Abdominal pain earlier today. Diverticulitis suspected. EXAM: CT ABDOMEN AND PELVIS WITH CONTRAST TECHNIQUE: Multidetector CT imaging of the abdomen and pelvis was performed using the standard protocol following bolus administration of intravenous contrast. CONTRAST:  138mL ISOVUE-300 IOPAMIDOL (ISOVUE-300) INJECTION 61% COMPARISON:  08/20/2017 CT FINDINGS: Lower chest: Large hiatal hernia is redemonstrated. Heart size is top normal without pericardial effusion. Dependent changes are noted at the lung bases. No effusion or pneumothorax. Previously noted left lower lobe pulmonary nodule is not included as part of this study. Hepatobiliary: No focal liver abnormality is seen. Distended gallbladder without mural thickening or stones again noted. No biliary dilatation. Pancreas: Atrophic pancreas without mass, ductal dilatation or inflammation. Spleen: Normal in size without focal abnormality. Adrenals/Urinary Tract: Normal bilateral adrenal glands and kidneys. No enhancing renal mass, obstructive uropathy or nephrolithiasis. The urinary bladder is normal in appearance without focal mural thickening or calculus. Stomach/Bowel: As mentioned, a large hiatal hernia is noted. The stomach is decompressed.  Normal small bowel rotation without obstruction or inflammation. Rather extensive colonic diverticulosis 1 the descending and sigmoid colon without definite diverticulitis. Appendectomy by report. Vascular/Lymphatic: Atherosclerosis of the abdominal aorta without aneurysm. No pathologically enlarged lymph nodes within the abdomen pelvis. Reproductive: Status post hysterectomy. No adnexal masses. Other: No abdominal wall hernia or abnormality. No abdominopelvic ascites. Musculoskeletal: Chronic thoracolumbar spondylosis with partial fusion across the T12 through L2 interspace. IMPRESSION: 1. Colonic diverticulosis without acute diverticulitis. No bowel obstruction or inflammation. 2. Large hiatal hernia. 3. Thoracolumbar spondylosis. Aortic Atherosclerosis (ICD10-I70.0). Electronically Signed   By: Ashley Royalty M.D.   On: 01/01/2018 23:23    EKG: Independently reviewed.  Normal sinus rhythm.  Assessment/Plan Principal Problem:   Acute encephalopathy Active Problems:   CKD (chronic kidney disease) stage 3, GFR 30-59 ml/min (HCC)   HTN (hypertension)   Acute lower UTI    1. Acute encephalopathy was a brief episode which is resolved.  CT head unremarkable.  Could be from UTI.  Observe. 2. Left lower quadrant pain could be from UTI.  On ceftriaxone. 3. UTI on ceftriaxone follow cultures. 4. History of CHF on Lasix Imdur metoprolol which will be continued. 5. Anemia follow CBC. 6. Chronically elevated troponin.   DVT prophylaxis: Lovenox. Code Status: DNR. Family Communication: Discussed with patient. Disposition Plan: Back to facility. Consults called: None. Admission status: Observation   Rise Patience MD [facility] Pager [phone].  If 7PM-7AM, please contact night-coverage [email or web address] Password [email or web address]  01/02/2018, 2:46 AM

## 2018-01-02 NOTE — ED Notes (Signed)
Attempted report to receiving RN. She will call back when able.

## 2018-01-02 NOTE — Progress Notes (Signed)
PROGRESS NOTE  [name] QPY:[id number] DOB: [birth year] DOA: 01/01/2018 PCP: Josetta Huddle, MD  HPI/Recap of past 24 hours: HPI from Dr Billey Gosling [name] is a 84 y.o. female with history of hypertension, CHF, chronic kidney disease who was recently admitted in July for GI bleed, was found to be acutely confused which lasted for around 5 minutes at the nursing facility unable to recognize staff.  Patient also had complained of left lower quadrant abdominal pain. In the ER patient was back to her baseline alert awake and oriented to her name and place.  Moves all extremities.  Had complained of left lower quadrant pain patient had CT head and abdomen which was unremarkable. UA was compatible with UTI. Troponin is mildly elevated and has been chronically elevated denies any chest pain.  Patient was started on antibiotics for UTI and admitted for further observation.   Today, patient now reports right lower quadrant abdominal pain, denies any nausea/vomiting, fever/chills, dysuria, chest pain, shortness of breath.  Assessment/Plan: Principal Problem:   Acute encephalopathy Active Problems:   CKD (chronic kidney disease) stage 3, GFR 30-59 ml/min (HCC)   HTN (hypertension)   Acute lower UTI  Acute metabolic encephalopathy likely 2/2 UTI Resolved Afebrile, no leukocytosis CT head unremarkable UA showed positive nitrites, trace leukocytes, few bacteria, WBC 21-50 UC pending Monitor closely  UTI UA showed positive nitrites, trace leukocytes, few bacteria, WBC 21-50 UC pending Continue IV Rocephin  Abdominal pain On presentation complained of left lower quadrant, now complains of right lower quadrant Unknown etiology CT abdomen/pelvis unremarkable for any acute process Continue to monitor  Elevated troponin Noted to be chronic Denies any chest pain Monitor on telemetry for 24 hours  Chronic systolic HF Appears euvolemic Continue home Lasix, imdur,  metoprolol  Normocytic anemia Unknown etiology Daily CBC        Malnutrition Type:      Malnutrition Characteristics:      Nutrition Interventions:       Estimated body mass index is 25.33 kg/m as calculated from the following:   Height as of this encounter: 5\' 1"  (1.549 m).   Weight as of this encounter: 60.8 kg.     Code Status: DNR  Family Communication: None at bedside  Disposition Plan: Likely 01/03/2018   Consultants:  None  Procedures:  None  Antimicrobials:  Rocephin  DVT prophylaxis: Lovenox   Objective: Vitals:   01/02/18 0358 01/02/18 0643 01/02/18 0800 01/02/18 1115  BP: (!) 151/53 (!) 133/54 (!) 128/58 121/60  Pulse: 68 70 67 65  Resp: (!) 22 19 13 17   Temp:      TempSrc:      SpO2: 96% 96% 96% 97%  Weight:      Height:       No intake or output data in the 24 hours ending 01/02/18 1138 Filed Weights   01/01/18 1930  Weight: 60.8 kg    Exam:   General: NAD, alert, awake, oriented  Cardiovascular: S1, S2 present  Respiratory: CTA B  Abdomen: Soft, TTP on the right lower quadrant, nondistended, bowel sounds present  Musculoskeletal: No pedal edema bilaterally  Skin: Normal  Psychiatry: Normal mood   Data Reviewed: CBC: Recent Labs  Lab 01/01/18 2035 01/02/18 0500  WBC 6.7 6.2  HGB 11.8* 11.6*  HCT 37.7 36.7  MCV 95.4 95.8  PLT 177 124   Basic Metabolic Panel: Recent Labs  Lab 01/01/18 2035 01/02/18 0500  NA 138 137  K 3.8 3.4*  CL 105 106  CO2 25 24  GLUCOSE 95 90  BUN 13 13  CREATININE 0.79 0.78  CALCIUM 9.0 8.6*   GFR: Estimated Creatinine Clearance: 32.1 mL/min (by C-G formula based on SCr of 0.78 mg/dL). Liver Function Tests: Recent Labs  Lab 01/01/18 2035  AST 18  ALT 10  ALKPHOS 60  BILITOT 0.2*  PROT 6.6  ALBUMIN 3.3*   Recent Labs  Lab 01/01/18 2035  LIPASE 33   No results for input(s): AMMONIA in the last 168 hours. Coagulation Profile: No results for input(s):  INR, PROTIME in the last 168 hours. Cardiac Enzymes: Recent Labs  Lab 01/01/18 2035 01/02/18 0011  TROPONINI 0.05* 0.04*   BNP (last 3 results) No results for input(s): PROBNP in the last 8760 hours. HbA1C: No results for input(s): HGBA1C in the last 72 hours. CBG: No results for input(s): GLUCAP in the last 168 hours. Lipid Profile: No results for input(s): CHOL, HDL, LDLCALC, TRIG, CHOLHDL, LDLDIRECT in the last 72 hours. Thyroid Function Tests: No results for input(s): TSH, T4TOTAL, FREET4, T3FREE, THYROIDAB in the last 72 hours. Anemia Panel: No results for input(s): VITAMINB12, FOLATE, FERRITIN, TIBC, IRON, RETICCTPCT in the last 72 hours. Urine analysis:    Component Value Date/Time   COLORURINE YELLOW 01/01/2018 2154   APPEARANCEUR HAZY (A) 01/01/2018 2154   LABSPEC 1.014 01/01/2018 2154   PHURINE 5.0 01/01/2018 2154   GLUCOSEU NEGATIVE 01/01/2018 2154   HGBUR NEGATIVE 01/01/2018 2154   BILIRUBINUR NEGATIVE 01/01/2018 2154   [place] 01/01/2018 2154   PROTEINUR NEGATIVE 01/01/2018 2154   UROBILINOGEN 0.2 09/18/2014 1600   NITRITE POSITIVE (A) 01/01/2018 2154   LEUKOCYTESUR TRACE (A) 01/01/2018 2154   Sepsis Labs: @LABRCNTIP (procalcitonin:4,lacticidven:4)  )No results found for this or any previous visit (from the past 240 hour(s)).    Studies: Dg Chest 2 View  Result Date: 01/01/2018 CLINICAL DATA:  Per ems: pt coming from home c/o "5-10 minute bout of confusion." Was unable to recognize night time care giver. Currently AANDOX4, ambulates with walker, lungs clear, and passed stroke screen. EXAM: CHEST - 2 VIEW COMPARISON:  06/30/2017 FINDINGS: Heart size is normal. The lungs are free of focal consolidations and pleural effusions. Hiatal hernia is present. IMPRESSION: No evidence for acute cardiopulmonary abnormality. Electronically Signed   By: Nolon Nations M.D.   On: 01/01/2018 20:33   Ct Head Wo Contrast  Result Date: 01/01/2018 CLINICAL DATA:   Confusion for several minutes. EXAM: CT HEAD WITHOUT CONTRAST TECHNIQUE: Contiguous axial images were obtained from the base of the skull through the vertex without intravenous contrast. COMPARISON:  09/05/2016 FINDINGS: BRAIN: There is sulcal and ventricular prominence consistent with superficial and central atrophy. No intraparenchymal hemorrhage, mass effect nor midline shift. Periventricular and subcortical white matter hypodensities consistent with chronic small vessel ischemic disease are identified. No acute large vascular territory infarcts. No abnormal extra-axial fluid collections. Basal cisterns are not effaced and midline. VASCULAR: Moderate calcific atherosclerosis of the carotid siphons. SKULL: No skull fracture. No significant scalp soft tissue swelling. SINUSES/ORBITS: The mastoid air-cells are clear. The included paranasal sinuses are well-aerated.The included ocular globes and orbital contents are non-suspicious. OTHER: None. IMPRESSION: Atrophy with chronic moderate small vessel ischemic disease. No acute intracranial abnormality. Electronically Signed   By: Ashley Royalty M.D.   On: 01/01/2018 23:15   Ct Abdomen Pelvis W Contrast  Result Date: 01/01/2018 CLINICAL DATA:  Abdominal pain earlier today. Diverticulitis suspected. EXAM: CT ABDOMEN AND PELVIS WITH CONTRAST TECHNIQUE: Multidetector CT  imaging of the abdomen and pelvis was performed using the standard protocol following bolus administration of intravenous contrast. CONTRAST:  162mL ISOVUE-300 IOPAMIDOL (ISOVUE-300) INJECTION 61% COMPARISON:  08/20/2017 CT FINDINGS: Lower chest: Large hiatal hernia is redemonstrated. Heart size is top normal without pericardial effusion. Dependent changes are noted at the lung bases. No effusion or pneumothorax. Previously noted left lower lobe pulmonary nodule is not included as part of this study. Hepatobiliary: No focal liver abnormality is seen. Distended gallbladder without mural thickening or stones  again noted. No biliary dilatation. Pancreas: Atrophic pancreas without mass, ductal dilatation or inflammation. Spleen: Normal in size without focal abnormality. Adrenals/Urinary Tract: Normal bilateral adrenal glands and kidneys. No enhancing renal mass, obstructive uropathy or nephrolithiasis. The urinary bladder is normal in appearance without focal mural thickening or calculus. Stomach/Bowel: As mentioned, a large hiatal hernia is noted. The stomach is decompressed. Normal small bowel rotation without obstruction or inflammation. Rather extensive colonic diverticulosis 1 the descending and sigmoid colon without definite diverticulitis. Appendectomy by report. Vascular/Lymphatic: Atherosclerosis of the abdominal aorta without aneurysm. No pathologically enlarged lymph nodes within the abdomen pelvis. Reproductive: Status post hysterectomy. No adnexal masses. Other: No abdominal wall hernia or abnormality. No abdominopelvic ascites. Musculoskeletal: Chronic thoracolumbar spondylosis with partial fusion across the T12 through L2 interspace. IMPRESSION: 1. Colonic diverticulosis without acute diverticulitis. No bowel obstruction or inflammation. 2. Large hiatal hernia. 3. Thoracolumbar spondylosis. Aortic Atherosclerosis (ICD10-I70.0). Electronically Signed   By: Ashley Royalty M.D.   On: 01/01/2018 23:23    Scheduled Meds: . enoxaparin (LOVENOX) injection  40 mg Subcutaneous Q24H  . furosemide  20 mg Oral Q M,W,F  . isosorbide mononitrate  30 mg Oral Daily  . metoprolol succinate  25 mg Oral BID  . potassium chloride SA  20 mEq Oral BID  . prenatal multivitamin  1 tablet Oral Q1200    Continuous Infusions: . cefTRIAXone (ROCEPHIN)  IV       LOS: 0 days     Alma Friendly, MD [facility]  If 7PM-7AM, please contact night-coverage [email or web address] 01/02/2018, 11:38 AM

## 2018-01-03 DIAGNOSIS — N390 Urinary tract infection, site not specified: Principal | ICD-10-CM

## 2018-01-03 LAB — CBC WITH DIFFERENTIAL/PLATELET
Abs Immature Granulocytes: 0.05 10*3/uL (ref 0.00–0.07)
Basophils Absolute: 0 10*3/uL (ref 0.0–0.1)
Basophils Relative: 0 %
Eosinophils Absolute: 0 10*3/uL (ref 0.0–0.5)
Eosinophils Relative: 0 %
HCT: 38.4 % (ref 36.0–46.0)
Hemoglobin: 12 g/dL (ref 12.0–15.0)
Immature Granulocytes: 1 %
Lymphocytes Relative: 35 %
Lymphs Abs: 2.4 10*3/uL (ref 0.7–4.0)
MCH: 30.2 pg (ref 26.0–34.0)
MCHC: 31.3 g/dL (ref 30.0–36.0)
MCV: 96.7 fL (ref 80.0–100.0)
Monocytes Absolute: 1 10*3/uL (ref 0.1–1.0)
Monocytes Relative: 15 %
Neutro Abs: 3.4 10*3/uL (ref 1.7–7.7)
Neutrophils Relative %: 49 %
Platelets: 165 10*3/uL (ref 150–400)
RBC: 3.97 MIL/uL (ref 3.87–5.11)
RDW: 13.8 % (ref 11.5–15.5)
WBC: 6.9 10*3/uL (ref 4.0–10.5)
nRBC: 0 % (ref 0.0–0.2)

## 2018-01-03 LAB — BASIC METABOLIC PANEL WITH GFR
Anion gap: 11 (ref 5–15)
BUN: 11 mg/dL (ref 8–23)
CO2: 24 mmol/L (ref 22–32)
Calcium: 9 mg/dL (ref 8.9–10.3)
Chloride: 105 mmol/L (ref 98–111)
Creatinine, Ser: 0.88 mg/dL (ref 0.44–1.00)
GFR calc Af Amer: >60 mL/min
GFR calc non Af Amer: 54 mL/min — ABNORMAL LOW
Glucose, Bld: 91 mg/dL (ref 70–99)
Potassium: 3.8 mmol/L (ref 3.5–5.1)
Sodium: 140 mmol/L (ref 135–145)

## 2018-01-03 MED ORDER — ENOXAPARIN SODIUM 30 MG/0.3ML ~~LOC~~ SOLN
30.0000 mg | SUBCUTANEOUS | Status: DC
  Administered 2018-01-04: 30 mg via SUBCUTANEOUS
  Filled 2018-01-03: qty 0.3

## 2018-01-03 MED ORDER — POLYETHYLENE GLYCOL 3350 17 G PO PACK
17.0000 g | PACK | Freq: Every day | ORAL | Status: DC
  Administered 2018-01-03: 17 g via ORAL
  Filled 2018-01-03 (×2): qty 1

## 2018-01-03 NOTE — Progress Notes (Signed)
PROGRESS NOTE  [name] BWG:[id number] DOB: [birth year] DOA: 01/01/2018 PCP: Josetta Huddle, MD  HPI/Recap of past 24 hours: HPI from Dr Billey Gosling [name] is a 84 y.o. female with history of hypertension, CHF, chronic kidney disease who was recently admitted in July for GI bleed, was found to be acutely confused which lasted for around 5 minutes at the nursing facility unable to recognize staff.  Patient also had complained of left lower quadrant abdominal pain. In the ER patient was back to her baseline alert awake and oriented to her name and place.  Moves all extremities.  Had complained of left lower quadrant pain patient had CT head and abdomen which was unremarkable. UA was compatible with UTI. Troponin is mildly elevated and has been chronically elevated denies any chest pain.  Patient was started on antibiotics for UTI and admitted for further observation.   Today, patient reports feeling tired, denies any other complaints.  Assessment/Plan: Principal Problem:   Acute encephalopathy Active Problems:   CKD (chronic kidney disease) stage 3, GFR 30-59 ml/min (HCC)   HTN (hypertension)   Acute lower UTI  Acute metabolic encephalopathy likely 2/2 UTI Resolved Afebrile, no leukocytosis CT head unremarkable UA showed positive nitrites, trace leukocytes, few bacteria, WBC 21-50 UC with E.coli  E.coli UTI UA showed positive nitrites, trace leukocytes, few bacteria, WBC 21-50 UC with E.coli Continue IV Rocephin for now pending susceptibilities  Abdominal pain Resolved On presentation complained of left lower quadrant, now complains of right lower quadrant Unknown etiology CT abdomen/pelvis unremarkable for any acute process Continue to monitor  Elevated troponin Noted to be chronic Denies any chest pain  Chronic systolic HF Appears euvolemic Continue home Lasix, imdur, metoprolol        Malnutrition Type:      Malnutrition  Characteristics:      Nutrition Interventions:       Estimated body mass index is 25.33 kg/m as calculated from the following:   Height as of this encounter: 5\' 1"  (1.549 m).   Weight as of this encounter: 60.8 kg.     Code Status: DNR  Family Communication: None at bedside  Disposition Plan: Likely 01/04/2018   Consultants:  None  Procedures:  None  Antimicrobials:  Rocephin  DVT prophylaxis: Lovenox   Objective: Vitals:   01/02/18 1504 01/02/18 2111 01/03/18 0715 01/03/18 1242  BP: 119/61 136/89 118/62 108/71  Pulse: 69 67 62 79  Resp: 16 18 20 16   Temp: 97.6 F (36.4 C) 98.8 F (37.1 C) 98 F (36.7 C) 98.1 F (36.7 C)  TempSrc: Oral Oral Oral Oral  SpO2: 97% 97% 94% 98%  Weight:      Height:        Intake/Output Summary (Last 24 hours) at 01/03/2018 1802 Last data filed at 01/03/2018 0320 Gross per 24 hour  Intake 100.07 ml  Output -  Net 100.07 ml   Filed Weights   01/01/18 1930  Weight: 60.8 kg    Exam:  General: NAD   Cardiovascular: S1, S2 present  Respiratory: CTAB  Abdomen: Soft, nontender, nondistended, bowel sounds present  Musculoskeletal: No bilateral pedal edema noted  Skin: Normal  Psychiatry: Normal mood   Data Reviewed: CBC: Recent Labs  Lab 01/01/18 2035 01/02/18 0500 01/03/18 0448  WBC 6.7 6.2 6.9  NEUTROABS  --   --  3.4  HGB 11.8* 11.6* 12.0  HCT 37.7 36.7 38.4  MCV 95.4 95.8 96.7  PLT 177 154 165   Basic  Metabolic Panel: Recent Labs  Lab 01/01/18 2035 01/02/18 0500 01/03/18 0448  NA 138 137 140  K 3.8 3.4* 3.8  CL 105 106 105  CO2 25 24 24   GLUCOSE 95 90 91  BUN 13 13 11   CREATININE 0.79 0.78 0.88  CALCIUM 9.0 8.6* 9.0   GFR: Estimated Creatinine Clearance: 29.2 mL/min (by C-G formula based on SCr of 0.88 mg/dL). Liver Function Tests: Recent Labs  Lab 01/01/18 2035  AST 18  ALT 10  ALKPHOS 60  BILITOT 0.2*  PROT 6.6  ALBUMIN 3.3*   Recent Labs  Lab 01/01/18 2035  LIPASE  33   No results for input(s): AMMONIA in the last 168 hours. Coagulation Profile: No results for input(s): INR, PROTIME in the last 168 hours. Cardiac Enzymes: Recent Labs  Lab 01/01/18 2035 01/02/18 0011  TROPONINI 0.05* 0.04*   BNP (last 3 results) No results for input(s): PROBNP in the last 8760 hours. HbA1C: No results for input(s): HGBA1C in the last 72 hours. CBG: No results for input(s): GLUCAP in the last 168 hours. Lipid Profile: No results for input(s): CHOL, HDL, LDLCALC, TRIG, CHOLHDL, LDLDIRECT in the last 72 hours. Thyroid Function Tests: No results for input(s): TSH, T4TOTAL, FREET4, T3FREE, THYROIDAB in the last 72 hours. Anemia Panel: No results for input(s): VITAMINB12, FOLATE, FERRITIN, TIBC, IRON, RETICCTPCT in the last 72 hours. Urine analysis:    Component Value Date/Time   COLORURINE YELLOW 01/01/2018 2154   APPEARANCEUR HAZY (A) 01/01/2018 2154   LABSPEC 1.014 01/01/2018 2154   PHURINE 5.0 01/01/2018 2154   GLUCOSEU NEGATIVE 01/01/2018 2154   HGBUR NEGATIVE 01/01/2018 2154   BILIRUBINUR NEGATIVE 01/01/2018 2154   KETONESUR NEGATIVE 01/01/2018 2154   PROTEINUR NEGATIVE 01/01/2018 2154   UROBILINOGEN 0.2 09/18/2014 1600   NITRITE POSITIVE (A) 01/01/2018 2154   LEUKOCYTESUR TRACE (A) 01/01/2018 2154   Sepsis Labs: @LABRCNTIP (procalcitonin:4,lacticidven:4)  ) Recent Results (from the past 240 hour(s))  Urine Culture     Status: Abnormal (Preliminary result)   Collection Time: 01/01/18  9:54 PM  Result Value Ref Range Status   Specimen Description   Final    URINE, CLEAN CATCH Performed at [facility], [place] [address], [place], [place] [address]    Special Requests   Final    NONE Performed at [facility], [place] [address] [place],  [address]    Culture >=100,000 COLONIES/mL ESCHERICHIA COLI (A)  Final   Report Status PENDING  Incomplete      Studies: No results found.  Scheduled Meds: .  enoxaparin (LOVENOX) injection  40 mg Subcutaneous Q24H  . furosemide  20 mg Oral Q M,W,F  . isosorbide mononitrate  30 mg Oral Daily  . metoprolol succinate  25 mg Oral BID  . polyethylene glycol  17 g Oral Daily  . potassium chloride SA  20 mEq Oral BID  . prenatal multivitamin  1 tablet Oral Q1200    Continuous Infusions: . cefTRIAXone (ROCEPHIN)  IV Stopped (01/02/18 1855)     LOS: 1 day     Alma Friendly, MD [facility]  If 7PM-7AM, please contact night-coverage [email or web address] 01/03/2018, 6:02 PM

## 2018-01-03 NOTE — Progress Notes (Signed)
Pt refused IV antibiotics and had RN take out her PIV because it was hurting.  RN educated pt on importance of PIV and her antibiotics.  RN attempted to place another PIV but pt did not tolerate.  Will report off to next shift to try again.    Danton Clap, RN

## 2018-01-04 LAB — URINE CULTURE: Culture: >=100000 — AB

## 2018-01-04 MED ORDER — CEPHALEXIN 500 MG PO CAPS: 500.0000 mg | ORAL_CAPSULE | Freq: Two times a day (BID) | ORAL | 0 refills | Status: AC

## 2018-01-04 MED ORDER — CEPHALEXIN 500 MG PO CAPS
500.0000 mg | ORAL_CAPSULE | Freq: Once | ORAL | Status: AC
  Administered 2018-01-04: 500 mg via ORAL
  Filled 2018-01-04: qty 1

## 2018-01-04 NOTE — Discharge Summary (Signed)
Discharge Summary  [name] HCW:[id number] DOB: 02-23-1916  PCP: Josetta Huddle, MD  Admit date: 01/01/2018 Discharge date: 01/04/2018  Time spent: 35 mins  Recommendations for Outpatient Follow-up:  1. PCP in 1 week  Discharge Diagnoses:  Active Hospital Problems   Diagnosis Date Noted  . Acute encephalopathy 01/01/2018  . Acute lower UTI 01/02/2018  . HTN (hypertension) 09/12/2014  . CKD (chronic kidney disease) stage 3, GFR 30-59 ml/min (HCC) 09/12/2014    Resolved Hospital Problems  No resolved problems to display.    Discharge Condition: Stable  Diet recommendation: Heart healthy  Vitals:   01/04/18 0456 01/04/18 0954  BP: (!) 143/66 (!) 139/51  Pulse: 65 66  Resp: 20   Temp: 98.6 F (37 C)   SpO2: 93% 97%    History of present illness:  Morgan M Meredithis a 84 y.o.femalewithhistory of hypertension, CHF, chronic kidney disease who was recently admitted in July for GI bleed, was found to be acutely confused which lasted for around 5 minutes at the nursing facility unable to recognize staff. Patient also had complained of left lower quadrant abdominal pain. In the ER patient was back to her baseline alert awake and oriented to her name and place. Moves all extremities. Had complained of left lower quadrant pain patient had CT head and abdomen which was unremarkable. UA was compatible with UTI. Troponin is mildly elevated and has been chronically elevated denies any chest pain. Patient was started on antibiotics for UTI and admitted for further observation.   Today, pt denies any new complaints. No fever/chills, chest pain, abdominal pain, SOB.  Hospital Course:  Principal Problem:   Acute encephalopathy Active Problems:   CKD (chronic kidney disease) stage 3, GFR 30-59 ml/min (HCC)   HTN (hypertension)   Acute lower UTI  Acute metabolic encephalopathy likely 2/2 UTI Resolved Afebrile, no leukocytosis CT head unremarkable UA showed positive  nitrites, trace leukocytes, few bacteria, WBC 21-50 UC with E.coli  E.coli UTI UA showed positive nitrites, trace leukocytes, few bacteria, WBC 21-50 UC with E.coli D/C pt on PO keflex for a total of 5 days of antibiotics  Abdominal pain Resolved, unknown etiology, ?UTI On presentation complained of left lower quadrant CT abdomen/pelvis unremarkable for any acute process  Elevated troponin Noted to be chronic Denies any chest pain  Chronic systolic HF Appears euvolemic Continue home Lasix, imdur, metoprolol        Malnutrition Type:      Malnutrition Characteristics:      Nutrition Interventions:      Estimated body mass index is 25.33 kg/m as calculated from the following:   Height as of this encounter: 5\' 1"  (1.549 m).   Weight as of this encounter: 60.8 kg.    Procedures:  None  Consultations:  None  Discharge Exam: BP (!) 139/51 (BP Location: Right Arm)   Pulse 66   Temp 98.6 F (37 C)   Resp 20   Ht 5\' 1"  (1.549 m)   Wt 60.8 kg   SpO2 97%   BMI 25.33 kg/m   General: NAD Cardiovascular: S1, S2 present Respiratory: CTAB  Discharge Instructions You were cared for by a hospitalist during your hospital stay. If you have any questions about your discharge medications or the care you received while you were in the hospital after you are discharged, you can call the unit and asked to speak with the hospitalist on call if the hospitalist that took care of you is not available. Once you are  discharged, your primary care physician will handle any further medical issues. Please note that NO REFILLS for any discharge medications will be authorized once you are discharged, as it is imperative that you return to your primary care physician (or establish a relationship with a primary care physician if you do not have one) for your aftercare needs so that they can reassess your need for medications and monitor your lab values.   Allergies as of  01/04/2018      Reactions   Ace Inhibitors Hives, Itching   Streptomycin Other (See Comments), Itching   Pruritus Pruritus   Tramadol Other (See Comments)   unknown   Vesicare [solifenacin Succinate] Itching   Penicillins Rash   Pruritic rash Has patient had a PCN reaction causing immediate rash, facial/tongue/throat swelling, SOB or lightheadedness with hypotension: unknown Has patient had a PCN reaction causing severe rash involving mucus membranes or skin necrosis: unknown Has patient had a PCN reaction that required hospitalization:unknown Has patient had a PCN reaction occurring within the last 10 years: yes If all of the above answers are "NO", then may proceed with Cephalosporin use.      Medication List    STOP taking these medications   acetaminophen 500 MG tablet Commonly known as:  TYLENOL     TAKE these medications   cephALEXin 500 MG capsule Commonly known as:  KEFLEX Take 1 capsule (500 mg total) by mouth 2 (two) times daily for 3 days.   clotrimazole 1 % cream Commonly known as:  LOTRIMIN Apply 1 application topically 2 (two) times daily.   furosemide 40 MG tablet Commonly known as:  LASIX Take 0.5 tablets by mouth. Take 0.5 tablet by mouth Monday, Wednesday, Friday   isosorbide mononitrate 30 MG 24 hr tablet Commonly known as:  IMDUR TAKE 1 TABLET BY MOUTH ONCE DAILY   loperamide 2 MG capsule Commonly known as:  IMODIUM Take 1 capsule (2 mg total) by mouth as needed for diarrhea or loose stools.   meclizine 25 MG tablet Commonly known as:  ANTIVERT Take 25 mg by mouth 2 (two) times daily as needed for dizziness or nausea.   metoprolol succinate 25 MG 24 hr tablet Commonly known as:  TOPROL-XL Take 25 mg by mouth 2 (two) times daily.   pantoprazole 40 MG tablet Commonly known as:  PROTONIX Take 40 mg by mouth daily as needed (indigestion).   polyethylene glycol packet Commonly known as:  MIRALAX / GLYCOLAX Take 17 g by mouth daily as needed  for mild constipation.   potassium chloride SA 20 MEQ tablet Commonly known as:  K-DUR,KLOR-CON Take 20 mEq by mouth 2 (two) times daily.   PREMARIN 0.3 MG tablet Generic drug:  estrogens (conjugated) Take 0.3 mg daily by mouth.   PRENATAL PO Take 1 tablet by mouth daily.   REFRESH OP Apply 1 drop to eye 2 (two) times daily.   RESTASIS 0.05 % ophthalmic emulsion Generic drug:  cycloSPORINE Place 1 drop into both eyes 2 (two) times daily.      Allergies  Allergen Reactions  . Ace Inhibitors Hives and Itching  . Streptomycin Other (See Comments) and Itching    Pruritus Pruritus   . Tramadol Other (See Comments)    unknown  . Vesicare [Solifenacin Succinate] Itching  . Penicillins Rash    Pruritic rash Has patient had a PCN reaction causing immediate rash, facial/tongue/throat swelling, SOB or lightheadedness with hypotension: unknown Has patient had a PCN reaction causing severe rash involving  mucus membranes or skin necrosis: unknown Has patient had a PCN reaction that required hospitalization:unknown Has patient had a PCN reaction occurring within the last 10 years: yes If all of the above answers are "NO", then may proceed with Cephalosporin use.    Follow-up Information    Josetta Huddle, MD. Schedule an appointment as soon as possible for a visit in 1 week(s).   Specialty:  Internal Medicine Contact information: [address] [place] [address] [phone]        Jettie Booze, MD .   Specialties:  Cardiology, Radiology, Interventional Cardiology Contact information: [address] [place] [address] [phone]            The results of significant diagnostics from this hospitalization (including imaging, microbiology, ancillary and laboratory) are listed below for reference.    Significant Diagnostic Studies: Dg Chest 2 View  Result Date: 01/01/2018 CLINICAL DATA:  Per ems: pt coming from home  c/o "5-10 minute bout of confusion." Was unable to recognize night time care giver. Currently AANDOX4, ambulates with walker, lungs clear, and passed stroke screen. EXAM: CHEST - 2 VIEW COMPARISON:  06/30/2017 FINDINGS: Heart size is normal. The lungs are free of focal consolidations and pleural effusions. Hiatal hernia is present. IMPRESSION: No evidence for acute cardiopulmonary abnormality. Electronically Signed   By: Nolon Nations M.D.   On: 01/01/2018 20:33   Ct Head Wo Contrast  Result Date: 01/01/2018 CLINICAL DATA:  Confusion for several minutes. EXAM: CT HEAD WITHOUT CONTRAST TECHNIQUE: Contiguous axial images were obtained from the base of the skull through the vertex without intravenous contrast. COMPARISON:  09/05/2016 FINDINGS: BRAIN: There is sulcal and ventricular prominence consistent with superficial and central atrophy. No intraparenchymal hemorrhage, mass effect nor midline shift. Periventricular and subcortical white matter hypodensities consistent with chronic small vessel ischemic disease are identified. No acute large vascular territory infarcts. No abnormal extra-axial fluid collections. Basal cisterns are not effaced and midline. VASCULAR: Moderate calcific atherosclerosis of the carotid siphons. SKULL: No skull fracture. No significant scalp soft tissue swelling. SINUSES/ORBITS: The mastoid air-cells are clear. The included paranasal sinuses are well-aerated.The included ocular globes and orbital contents are non-suspicious. OTHER: None. IMPRESSION: Atrophy with chronic moderate small vessel ischemic disease. No acute intracranial abnormality. Electronically Signed   By: Ashley Royalty M.D.   On: 01/01/2018 23:15   Ct Abdomen Pelvis W Contrast  Result Date: 01/01/2018 CLINICAL DATA:  Abdominal pain earlier today. Diverticulitis suspected. EXAM: CT ABDOMEN AND PELVIS WITH CONTRAST TECHNIQUE: Multidetector CT imaging of the abdomen and pelvis was performed using the standard protocol  following bolus administration of intravenous contrast. CONTRAST:  129mL ISOVUE-300 IOPAMIDOL (ISOVUE-300) INJECTION 61% COMPARISON:  08/20/2017 CT FINDINGS: Lower chest: Large hiatal hernia is redemonstrated. Heart size is top normal without pericardial effusion. Dependent changes are noted at the lung bases. No effusion or pneumothorax. Previously noted left lower lobe pulmonary nodule is not included as part of this study. Hepatobiliary: No focal liver abnormality is seen. Distended gallbladder without mural thickening or stones again noted. No biliary dilatation. Pancreas: Atrophic pancreas without mass, ductal dilatation or inflammation. Spleen: Normal in size without focal abnormality. Adrenals/Urinary Tract: Normal bilateral adrenal glands and kidneys. No enhancing renal mass, obstructive uropathy or nephrolithiasis. The urinary bladder is normal in appearance without focal mural thickening or calculus. Stomach/Bowel: As mentioned, a large hiatal hernia is noted. The stomach is decompressed. Normal small bowel rotation without obstruction or inflammation. Rather extensive colonic  diverticulosis 1 the descending and sigmoid colon without definite diverticulitis. Appendectomy by report. Vascular/Lymphatic: Atherosclerosis of the abdominal aorta without aneurysm. No pathologically enlarged lymph nodes within the abdomen pelvis. Reproductive: Status post hysterectomy. No adnexal masses. Other: No abdominal wall hernia or abnormality. No abdominopelvic ascites. Musculoskeletal: Chronic thoracolumbar spondylosis with partial fusion across the T12 through L2 interspace. IMPRESSION: 1. Colonic diverticulosis without acute diverticulitis. No bowel obstruction or inflammation. 2. Large hiatal hernia. 3. Thoracolumbar spondylosis. Aortic Atherosclerosis (ICD10-I70.0). Electronically Signed   By: Ashley Royalty M.D.   On: 01/01/2018 23:23   Dg Abd 2 Views  Result Date: 01/02/2018 CLINICAL DATA:  84 year old female with  constipation and abdominal pain EXAM: ABDOMEN - 2 VIEW COMPARISON:  CT 01/01/2018 FINDINGS: Gas within stomach, small bowel, colon. No abnormal distention. Retained enteric contrast within the urinary bladder. No unexpected radiopaque foreign body. No unexpected soft tissue density. Overlying EKG leads. Degenerative changes of the spine. Degenerative changes of the hips IMPRESSION: Nonobstructive bowel gas pattern. Electronically Signed   By: Corrie Mckusick D.O.   On: 01/02/2018 12:14    Microbiology: Recent Results (from the past 240 hour(s))  Urine Culture     Status: Abnormal   Collection Time: 01/01/18  9:54 PM  Result Value Ref Range Status   Specimen Description   Final    URINE, CLEAN CATCH Performed at [facility], [place] [address] [place], [place] [address]    Special Requests   Final    NONE Performed at [facility], [place] [address], [place], [place] [address]    Culture >=100,000 COLONIES/mL ESCHERICHIA COLI (A)  Final   Report Status 01/04/2018 FINAL  Final   Organism ID, Bacteria ESCHERICHIA COLI (A)  Final      Susceptibility   Escherichia coli - MIC*    AMPICILLIN >=32 RESISTANT Resistant     CEFAZOLIN 8 SENSITIVE Sensitive     CEFTRIAXONE <=1 SENSITIVE Sensitive     CIPROFLOXACIN >=4 RESISTANT Resistant     GENTAMICIN <=1 SENSITIVE Sensitive     IMIPENEM <=0.25 SENSITIVE Sensitive     NITROFURANTOIN <=16 SENSITIVE Sensitive     TRIMETH/SULFA >=320 RESISTANT Resistant     AMPICILLIN/SULBACTAM >=32 RESISTANT Resistant     PIP/TAZO <=4 SENSITIVE Sensitive     Extended ESBL NEGATIVE Sensitive     * >=100,000 COLONIES/mL ESCHERICHIA COLI     Labs: Basic Metabolic Panel: Recent Labs  Lab 01/01/18 2035 01/02/18 0500 01/03/18 0448  NA 138 137 140  K 3.8 3.4* 3.8  CL 105 106 105  CO2 25 24 24   GLUCOSE 95 90 91  BUN 13 13 11   CREATININE 0.79 0.78 0.88  CALCIUM 9.0 8.6* 9.0   Liver Function Tests: Recent Labs  Lab  01/01/18 2035  AST 18  ALT 10  ALKPHOS 60  BILITOT 0.2*  PROT 6.6  ALBUMIN 3.3*   Recent Labs  Lab 01/01/18 2035  LIPASE 33   No results for input(s): AMMONIA in the last 168 hours. CBC: Recent Labs  Lab 01/01/18 2035 01/02/18 0500 01/03/18 0448  WBC 6.7 6.2 6.9  NEUTROABS  --   --  3.4  HGB 11.8* 11.6* 12.0  HCT 37.7 36.7 38.4  MCV 95.4 95.8 96.7  PLT 177 154 165   Cardiac Enzymes: Recent Labs  Lab 01/01/18 2035 01/02/18 0011  TROPONINI 0.05* 0.04*   BNP: BNP (last 3 results) No results for input(s): BNP in the last 8760 hours.  ProBNP (last 3  results) No results for input(s): PROBNP in the last 8760 hours.  CBG: No results for input(s): GLUCAP in the last 168 hours.     Signed:  Alma Friendly, MD [facility] 01/04/2018, 11:55 AM

## 2018-01-05 NOTE — Care Management Note (Addendum)
Case Management Note  Patient Details  Name: [name] MRN: [id number] Date of Birth: 25-Aug-1916  Subjective/Objective:    Acute encephalopathy, acute UTI, HTN                Action/Plan: 01/04/2018 2 pm NCM spoke to pt and states she lives at home alone but has a private duty caregiver that come in am and in pm. States part of cost is covered by [place]. She has RW at home. Nephew assist with care as needed.   Expected Discharge Date:  01/04/18               Expected Discharge Plan:  Home/Self Care  In-House Referral:  NA  Discharge planning Services  CM Consult  Post Acute Care Choice:  NA Choice offered to:  NA  DME Arranged:  N/A DME Agency:  NA  HH Arranged:  NA HH Agency:  NA  Status of Service:  Completed, signed off  If discussed at [place] of Stay Meetings, dates discussed:    Additional Comments:  Erenest Rasher, RN 01/05/2018, 10:33 AM

## 2018-01-07 ENCOUNTER — Other Ambulatory Visit: Payer: Self-pay

## 2018-01-07 NOTE — Patient Outreach (Signed)
[place] [facility]) Care Management  01/07/2018  [name] Jan 05, 1917 [id number]  EMMI: general discharge Referral date: 01/07/18 Referral reason: got discharge papers:  " I don't know"  Insurance:  Medicare Day # 1  Telephone call to patient regarding referral. Unable to reach patient. HIPAA compliant voice message left with call back phone number.   PLAN: RNCM will attempt 2nd telephone call to patient within 4 business days. RNCM will send outreach letter.   Quinn Plowman RN,BSN, [place] Telephonic  [phone]

## 2018-01-09 ENCOUNTER — Other Ambulatory Visit: Payer: Self-pay

## 2018-01-09 NOTE — Patient Outreach (Signed)
[place] [facility]) Care Management  01/09/2018  [name] 1916-03-24 [id number]  EMMI: general discharge Referral date: 01/07/18 Referral reason: got discharge papers:  " I don't know"  Insurance:  Medicare Day # 1  Telephone call to patient regarding EMMI general discharge. HIPAA verfied with patient. Explained reason for call.  Patient states she is doing ok. Patient states she received her discharge paperwork from the hospital.  Patient states she she has a follow up appointment with her primary MD on 01/15/18.  She states she has her medications and takes them as prescribed. Patient reports she has finished her antibiotic that was prescribed for her due to the urinary tract infection.  Patient denies having any additional symptoms.  Patient states she has a caregiver that assist with her care and takes her to her appointments. Patient states she also has a nephew that assist with her care.  Patient denies having any additional needs or concerns. RNCM offered to mail patient [facility] care management brochure and 24 hour nurse line magnet. Patient verbally agreed and confirmed her address for mailing.  RNCM advised patient to notify MD of any changes in condition prior to scheduled appointment. RNCM verified patient aware of 911 services for urgent/ emergent needs.  PLAN: RNCM will close patient due to  Patient being assessed and having no further needs.  RNCM will send [facility] brochure/ magnet to patient  Quinn Plowman RN,BSN,CCM [facility] Telephonic  [phone]

## 2018-01-15 DIAGNOSIS — N390 Urinary tract infection, site not specified: Secondary | ICD-10-CM | POA: Diagnosis not present

## 2018-01-15 DIAGNOSIS — G9341 Metabolic encephalopathy: Secondary | ICD-10-CM | POA: Diagnosis not present

## 2018-02-18 DIAGNOSIS — L84 Corns and callosities: Secondary | ICD-10-CM | POA: Diagnosis not present

## 2018-02-18 DIAGNOSIS — L603 Nail dystrophy: Secondary | ICD-10-CM | POA: Diagnosis not present

## 2018-02-18 DIAGNOSIS — I739 Peripheral vascular disease, unspecified: Secondary | ICD-10-CM | POA: Diagnosis not present

## 2018-02-23 DIAGNOSIS — I25111 Atherosclerotic heart disease of native coronary artery with angina pectoris with documented spasm: Secondary | ICD-10-CM | POA: Diagnosis not present

## 2018-02-23 DIAGNOSIS — I5032 Chronic diastolic (congestive) heart failure: Secondary | ICD-10-CM | POA: Diagnosis not present

## 2018-02-23 DIAGNOSIS — F329 Major depressive disorder, single episode, unspecified: Secondary | ICD-10-CM | POA: Diagnosis not present

## 2018-02-23 DIAGNOSIS — M169 Osteoarthritis of hip, unspecified: Secondary | ICD-10-CM | POA: Diagnosis not present

## 2018-02-23 DIAGNOSIS — I10 Essential (primary) hypertension: Secondary | ICD-10-CM | POA: Diagnosis not present

## 2018-02-23 DIAGNOSIS — I509 Heart failure, unspecified: Secondary | ICD-10-CM | POA: Diagnosis not present

## 2018-02-23 DIAGNOSIS — F341 Dysthymic disorder: Secondary | ICD-10-CM | POA: Diagnosis not present

## 2018-02-23 DIAGNOSIS — M1990 Unspecified osteoarthritis, unspecified site: Secondary | ICD-10-CM | POA: Diagnosis not present

## 2018-02-23 DIAGNOSIS — M179 Osteoarthritis of knee, unspecified: Secondary | ICD-10-CM | POA: Diagnosis not present

## 2018-02-25 NOTE — Progress Notes (Signed)
Cardiology Office Note   Date:  02/26/2018   ID:  Morgan, [name] 11-03-1916, MRN [id number]  PCP:  Josetta Huddle, MD    No chief complaint on file.  Abnormal stress test  Wt Readings from Last 3 Encounters:  02/26/18 132 lb 12.8 oz (60.2 kg)  01/01/18 134 lb 0.6 oz (60.8 kg)  09/10/17 134 lb (60.8 kg)       History of Present Illness: [name] is a 85 y.o. female   who had an abnormal stress test several years ago. She has been medically managed for this.  She has had chronic DOE.  She was hospitalized in August 2016 for UTI which led to Escherichia coli bacteremia.  Echo in 5/18: Left ventricle: Abnormal septal motion. The cavity size was mildly dilated. Wall thickness was normal. Systolic function was mildly reduced. The estimated ejection fraction was in the range of 45% to 50%. Doppler parameters are consistent with both elevated ventricular end-diastolic filling pressure and elevated left atrial filling pressure. - Mitral valve: There was mild regurgitation. - Atrial septum: No defect or patent foramen ovale was identified. - Tricuspid valve: There was mild-moderate regurgitation. - Pulmonary arteries: PA peak pressure: 42 mm Hg (S).  She was hospitalized for GI bleed in July 2018.  She had UGI, no EGD due to age.  She feels that she has not gotten back to the level where she was.    She reported DOE.  She walks in the house.  SHe was told by her PMD this was age related.    She has lost 30 lbs over the past 2 years.    She has had some pains in her chest that just last a few seconds.  Worse with palpation.    Past Medical History:  Diagnosis Date  . Allergic rhinitis   . CAD (coronary artery disease)   . CHF (congestive heart failure) ([place])   . CKD (chronic kidney disease) stage 3, GFR 30-59 ml/min (HCC) 09/12/2014  . Diverticular disease   . Diverticulosis   . DJD (degenerative joint disease) of knee   . DJD  (degenerative joint disease) of lumbar spine    scoliosis  . Eczema   . Esophageal erosions    from nsaid   . GI bleed   . Hormone replacement therapy (postmenopausal)   . Hyperglycemia   . Hyperlipidemia   . Hypertension   . Pneumonia    july 2009  . Pulmonary hypertension ([place])    a. 2D echo 03/2008 showed normal biventricular thickness, chamber size and systolic function, EF 40-97%, mild mitral regurgitation, aortic sclerosis, moderate pulmonary HTN, pulmonic regurgitation.  Marland Kitchen PVC's (premature ventricular contractions)   . Reactive depression (situational)   . Sciatica    right lower extremity  . Shingles    right thoracic 2008  . Vertigo     Past Surgical History:  Procedure Laterality Date  . APPENDECTOMY    . BACK SURGERY    . BREAST LUMPECTOMY    . FLEXIBLE SIGMOIDOSCOPY Left 08/13/2013   Procedure: FLEXIBLE SIGMOIDOSCOPY;  Surgeon: Arta Silence, MD;  Location: [facility];  Service: Endoscopy;  Laterality: Left;  . TOTAL ABDOMINAL HYSTERECTOMY       Current Outpatient Medications  Medication Sig Dispense Refill  . clotrimazole (LOTRIMIN) 1 % cream Apply 1 application topically 2 (two) times daily.     . furosemide (LASIX) 40 MG tablet Take 0.5 tablets by mouth. Take 0.5 tablet by mouth Monday,  Wednesday, Friday  0  . isosorbide mononitrate (IMDUR) 30 MG 24 hr tablet TAKE 1 TABLET BY MOUTH ONCE DAILY 90 tablet 2  . loperamide (IMODIUM) 2 MG capsule Take 1 capsule (2 mg total) by mouth as needed for diarrhea or loose stools. 30 capsule 0  . meclizine (ANTIVERT) 25 MG tablet Take 25 mg by mouth 2 (two) times daily as needed for dizziness or nausea.     . metoprolol succinate (TOPROL-XL) 25 MG 24 hr tablet Take 25 mg by mouth 2 (two) times daily.    . pantoprazole (PROTONIX) 40 MG tablet Take 40 mg by mouth daily as needed (indigestion).   2  . polyethylene glycol (MIRALAX) packet Take 17 g by mouth daily as needed for mild constipation. 14 each 0  . Polyvinyl  Alcohol-Povidone (REFRESH OP) Apply 1 drop to eye 2 (two) times daily.    . potassium chloride SA (K-DUR,KLOR-CON) 20 MEQ tablet Take 20 mEq by mouth 2 (two) times daily.    Marland Kitchen PREMARIN 0.3 MG tablet Take 0.3 mg daily by mouth.  0  . Prenatal Vit-Fe Fumarate-FA (PRENATAL PO) Take 1 tablet by mouth daily.    . RESTASIS 0.05 % ophthalmic emulsion Place 1 drop into both eyes 2 (two) times daily.   4   No current facility-administered medications for this visit.     Allergies:   Ace inhibitors; Streptomycin; Tramadol; Vesicare [solifenacin succinate]; and Penicillins    Social History:  The patient  reports that she has never smoked. She has never used smokeless tobacco. She reports current alcohol use. She reports that she does not use drugs.   Family History:  The patient's family history includes Cancer in her brother; Heart attack in her brother, father, mother, and sister; Heart disease in her daughter, father, mother, sister, and son; Hypertension in her brother.    ROS:  Please see the history of present illness.   Otherwise, review of systems are positive for chest pain.   All other systems are reviewed and negative.    PHYSICAL EXAM: VS:  BP 132/86   Pulse 100   Ht 5\' 1"  (1.549 m)   Wt 132 lb 12.8 oz (60.2 kg)   SpO2 96%   BMI 25.09 kg/m  , BMI Body mass index is 25.09 kg/m. GEN: Well nourished, well developed, in no acute distress  HEENT: normal  Neck: no JVD, carotid bruits, or masses Cardiac: RRR; no murmurs, rubs, or gallops,no edema  Respiratory:  clear to auscultation bilaterally, normal work of breathing GI: soft, nontender, nondistended, + BS MS: no deformity or atrophy ; left sternal border pain with palpation Skin: warm and dry, no rash Neuro:  Strength and sensation are intact; slow gait Psych: euthymic mood, full affect    Recent Labs: 01/01/2018: ALT 10 01/03/2018: BUN 11; Creatinine, Ser 0.88; Hemoglobin 12.0; Platelets 165; Potassium 3.8; Sodium 140    Lipid Panel No results found for: CHOL, TRIG, HDL, CHOLHDL, VLDL, LDLCALC, LDLDIRECT   Other studies Reviewed: Additional studies/ records that were reviewed today with results demonstrating: labs reviewed.   ASSESSMENT AND PLAN:  1. Abnormal stress test: No angina.  She has atypical chest pain that I think is more chest wall related.  She can take Tylenol. 2. HTN: The current medical regimen is effective;  continue present plan and medications. 3. Palpitations: PVCs in the past were noted on exam.  No symptoms at this time 4. Dyspnea on exertion: She is quite deconditioned.  She does  not do much activity.  I do not see signs of active heart failure.  I think her shortness of breath is more age-related deconditioning.  I would not pursue further work-up at this time.   Current medicines are reviewed at length with the patient today.  The patient concerns regarding her medicines were addressed.  The following changes have been made:  No change  Labs/ tests ordered today include:  No orders of the defined types were placed in this encounter.   Recommend 150 minutes/week of aerobic exercise Low fat, low carb, high fiber diet recommended  Disposition:   FU in 9 months   Signed, Larae Grooms, MD  02/26/2018 1:33 PM    [facility] [place], [place],   [address] Phone: [phone]; Fax: [phone]

## 2018-02-26 ENCOUNTER — Ambulatory Visit (INDEPENDENT_AMBULATORY_CARE_PROVIDER_SITE_OTHER): Payer: Medicare Other | Admitting: Interventional Cardiology

## 2018-02-26 ENCOUNTER — Encounter: Payer: Self-pay | Admitting: Interventional Cardiology

## 2018-02-26 VITALS — BP 132/86 | HR 100 | Ht 61.0 in | Wt 132.8 lb

## 2018-02-26 DIAGNOSIS — R0782 Intercostal pain: Secondary | ICD-10-CM | POA: Diagnosis not present

## 2018-02-26 DIAGNOSIS — R9439 Abnormal result of other cardiovascular function study: Secondary | ICD-10-CM | POA: Diagnosis not present

## 2018-02-26 DIAGNOSIS — R002 Palpitations: Secondary | ICD-10-CM | POA: Diagnosis not present

## 2018-02-26 DIAGNOSIS — R0609 Other forms of dyspnea: Secondary | ICD-10-CM | POA: Diagnosis not present

## 2018-02-26 DIAGNOSIS — I493 Ventricular premature depolarization: Secondary | ICD-10-CM | POA: Diagnosis not present

## 2018-02-26 NOTE — Patient Instructions (Signed)
Medication Instructions:  Your physician recommends that you continue on your current medications as directed. Please refer to the Current Medication list given to you today.  If you need a refill on your cardiac medications before your next appointment, please call your pharmacy.   Lab work: None Ordered  If you have labs (blood work) drawn today and your tests are completely normal, you will receive your results only by: Marland Kitchen MyChart Message (if you have MyChart) OR . A paper copy in the mail If you have any lab test that is abnormal or we need to change your treatment, we will call you to review the results.  Testing/Procedures: None ordered  Follow-Up: At [facility], you and your health needs are our priority.  As part of our continuing mission to provide you with exceptional heart care, we have created designated Provider Care Teams.  These Care Teams include your primary Cardiologist (physician) and Advanced Practice Providers (APPs -  Physician Assistants and Nurse Practitioners) who all work together to provide you with the care you need, when you need it. . You will need a follow up appointment in September 2020.  Please call our office 2 months in advance to schedule this appointment.  You may see Casandra Doffing, MD or one of the following Advanced Practice Providers on your designated Care Team:   . Lyda Jester, PA-C . Dayna Dunn, PA-C . Ermalinda Barrios, PA-C  Any Other Special Instructions Will Be Listed Below (If Applicable).

## 2018-04-07 DIAGNOSIS — H04123 Dry eye syndrome of bilateral lacrimal glands: Secondary | ICD-10-CM | POA: Diagnosis not present

## 2018-04-16 DIAGNOSIS — F322 Major depressive disorder, single episode, severe without psychotic features: Secondary | ICD-10-CM | POA: Diagnosis not present

## 2018-04-16 DIAGNOSIS — I5032 Chronic diastolic (congestive) heart failure: Secondary | ICD-10-CM | POA: Diagnosis not present

## 2018-04-16 DIAGNOSIS — I2510 Atherosclerotic heart disease of native coronary artery without angina pectoris: Secondary | ICD-10-CM | POA: Diagnosis not present

## 2018-04-16 DIAGNOSIS — I10 Essential (primary) hypertension: Secondary | ICD-10-CM | POA: Diagnosis not present

## 2018-04-16 DIAGNOSIS — I509 Heart failure, unspecified: Secondary | ICD-10-CM | POA: Diagnosis not present

## 2018-04-16 DIAGNOSIS — M179 Osteoarthritis of knee, unspecified: Secondary | ICD-10-CM | POA: Diagnosis not present

## 2018-04-16 DIAGNOSIS — N183 Chronic kidney disease, stage 3 (moderate): Secondary | ICD-10-CM | POA: Diagnosis not present

## 2018-04-28 DIAGNOSIS — N183 Chronic kidney disease, stage 3 (moderate): Secondary | ICD-10-CM | POA: Diagnosis not present

## 2018-04-28 DIAGNOSIS — M179 Osteoarthritis of knee, unspecified: Secondary | ICD-10-CM | POA: Diagnosis not present

## 2018-04-28 DIAGNOSIS — F322 Major depressive disorder, single episode, severe without psychotic features: Secondary | ICD-10-CM | POA: Diagnosis not present

## 2018-04-28 DIAGNOSIS — M1990 Unspecified osteoarthritis, unspecified site: Secondary | ICD-10-CM | POA: Diagnosis not present

## 2018-04-28 DIAGNOSIS — I10 Essential (primary) hypertension: Secondary | ICD-10-CM | POA: Diagnosis not present

## 2018-04-28 DIAGNOSIS — M169 Osteoarthritis of hip, unspecified: Secondary | ICD-10-CM | POA: Diagnosis not present

## 2018-04-28 DIAGNOSIS — F329 Major depressive disorder, single episode, unspecified: Secondary | ICD-10-CM | POA: Diagnosis not present

## 2018-04-28 DIAGNOSIS — F341 Dysthymic disorder: Secondary | ICD-10-CM | POA: Diagnosis not present

## 2018-04-28 DIAGNOSIS — I2510 Atherosclerotic heart disease of native coronary artery without angina pectoris: Secondary | ICD-10-CM | POA: Diagnosis not present

## 2018-04-28 DIAGNOSIS — I509 Heart failure, unspecified: Secondary | ICD-10-CM | POA: Diagnosis not present

## 2018-04-28 DIAGNOSIS — I25111 Atherosclerotic heart disease of native coronary artery with angina pectoris with documented spasm: Secondary | ICD-10-CM | POA: Diagnosis not present

## 2018-05-07 DIAGNOSIS — I2510 Atherosclerotic heart disease of native coronary artery without angina pectoris: Secondary | ICD-10-CM | POA: Diagnosis not present

## 2018-05-07 DIAGNOSIS — N183 Chronic kidney disease, stage 3 (moderate): Secondary | ICD-10-CM | POA: Diagnosis not present

## 2018-05-07 DIAGNOSIS — I5032 Chronic diastolic (congestive) heart failure: Secondary | ICD-10-CM | POA: Diagnosis not present

## 2018-05-07 DIAGNOSIS — I10 Essential (primary) hypertension: Secondary | ICD-10-CM | POA: Diagnosis not present

## 2018-05-07 DIAGNOSIS — F322 Major depressive disorder, single episode, severe without psychotic features: Secondary | ICD-10-CM | POA: Diagnosis not present

## 2018-05-07 DIAGNOSIS — K219 Gastro-esophageal reflux disease without esophagitis: Secondary | ICD-10-CM | POA: Diagnosis not present

## 2018-05-21 DIAGNOSIS — I10 Essential (primary) hypertension: Secondary | ICD-10-CM | POA: Diagnosis not present

## 2018-05-21 DIAGNOSIS — F322 Major depressive disorder, single episode, severe without psychotic features: Secondary | ICD-10-CM | POA: Diagnosis not present

## 2018-05-21 DIAGNOSIS — I2510 Atherosclerotic heart disease of native coronary artery without angina pectoris: Secondary | ICD-10-CM | POA: Diagnosis not present

## 2018-05-21 DIAGNOSIS — N183 Chronic kidney disease, stage 3 (moderate): Secondary | ICD-10-CM | POA: Diagnosis not present

## 2018-05-21 DIAGNOSIS — I5032 Chronic diastolic (congestive) heart failure: Secondary | ICD-10-CM | POA: Diagnosis not present

## 2018-05-21 DIAGNOSIS — K219 Gastro-esophageal reflux disease without esophagitis: Secondary | ICD-10-CM | POA: Diagnosis not present

## 2018-05-22 DIAGNOSIS — I25111 Atherosclerotic heart disease of native coronary artery with angina pectoris with documented spasm: Secondary | ICD-10-CM | POA: Diagnosis not present

## 2018-05-22 DIAGNOSIS — F329 Major depressive disorder, single episode, unspecified: Secondary | ICD-10-CM | POA: Diagnosis not present

## 2018-05-22 DIAGNOSIS — I10 Essential (primary) hypertension: Secondary | ICD-10-CM | POA: Diagnosis not present

## 2018-05-22 DIAGNOSIS — F341 Dysthymic disorder: Secondary | ICD-10-CM | POA: Diagnosis not present

## 2018-05-22 DIAGNOSIS — F322 Major depressive disorder, single episode, severe without psychotic features: Secondary | ICD-10-CM | POA: Diagnosis not present

## 2018-05-22 DIAGNOSIS — N183 Chronic kidney disease, stage 3 (moderate): Secondary | ICD-10-CM | POA: Diagnosis not present

## 2018-05-22 DIAGNOSIS — M1990 Unspecified osteoarthritis, unspecified site: Secondary | ICD-10-CM | POA: Diagnosis not present

## 2018-05-22 DIAGNOSIS — M169 Osteoarthritis of hip, unspecified: Secondary | ICD-10-CM | POA: Diagnosis not present

## 2018-05-22 DIAGNOSIS — I509 Heart failure, unspecified: Secondary | ICD-10-CM | POA: Diagnosis not present

## 2018-05-22 DIAGNOSIS — I2510 Atherosclerotic heart disease of native coronary artery without angina pectoris: Secondary | ICD-10-CM | POA: Diagnosis not present

## 2018-05-22 DIAGNOSIS — M179 Osteoarthritis of knee, unspecified: Secondary | ICD-10-CM | POA: Diagnosis not present

## 2018-07-22 DIAGNOSIS — I509 Heart failure, unspecified: Secondary | ICD-10-CM | POA: Diagnosis not present

## 2018-07-22 DIAGNOSIS — F322 Major depressive disorder, single episode, severe without psychotic features: Secondary | ICD-10-CM | POA: Diagnosis not present

## 2018-07-22 DIAGNOSIS — R300 Dysuria: Secondary | ICD-10-CM | POA: Diagnosis not present

## 2018-07-22 DIAGNOSIS — R110 Nausea: Secondary | ICD-10-CM | POA: Diagnosis not present

## 2018-07-22 DIAGNOSIS — I25111 Atherosclerotic heart disease of native coronary artery with angina pectoris with documented spasm: Secondary | ICD-10-CM | POA: Diagnosis not present

## 2018-07-22 DIAGNOSIS — I5032 Chronic diastolic (congestive) heart failure: Secondary | ICD-10-CM | POA: Diagnosis not present

## 2018-07-22 DIAGNOSIS — G44009 Cluster headache syndrome, unspecified, not intractable: Secondary | ICD-10-CM | POA: Diagnosis not present

## 2018-07-22 DIAGNOSIS — I10 Essential (primary) hypertension: Secondary | ICD-10-CM | POA: Diagnosis not present

## 2018-07-22 DIAGNOSIS — N183 Chronic kidney disease, stage 3 (moderate): Secondary | ICD-10-CM | POA: Diagnosis not present

## 2018-07-23 ENCOUNTER — Ambulatory Visit
Admission: RE | Admit: 2018-07-23 | Discharge: 2018-07-23 | Disposition: A | Discharge status: Home / Self Care | Payer: Medicare Other | Source: Ambulatory Visit | Attending: Internal Medicine | Admitting: Internal Medicine

## 2018-07-23 ENCOUNTER — Other Ambulatory Visit: Payer: Self-pay | Admitting: Internal Medicine

## 2018-07-23 DIAGNOSIS — R1030 Lower abdominal pain, unspecified: Secondary | ICD-10-CM | POA: Diagnosis not present

## 2018-07-23 DIAGNOSIS — R1084 Generalized abdominal pain: Secondary | ICD-10-CM

## 2018-07-23 DIAGNOSIS — R109 Unspecified abdominal pain: Secondary | ICD-10-CM | POA: Diagnosis not present

## 2018-08-02 IMAGING — CR DG THORACIC SPINE 2V
3 series · 3 of 3 positions shown · non-contrast
Comparison: Prior radiograph from 09/12/2014.

CLINICAL DATA: Initial evaluation for acute right flank pain for 1
month radiating to back.

EXAM:
THORACIC SPINE 2 VIEWS

[t thoracic spine ap]
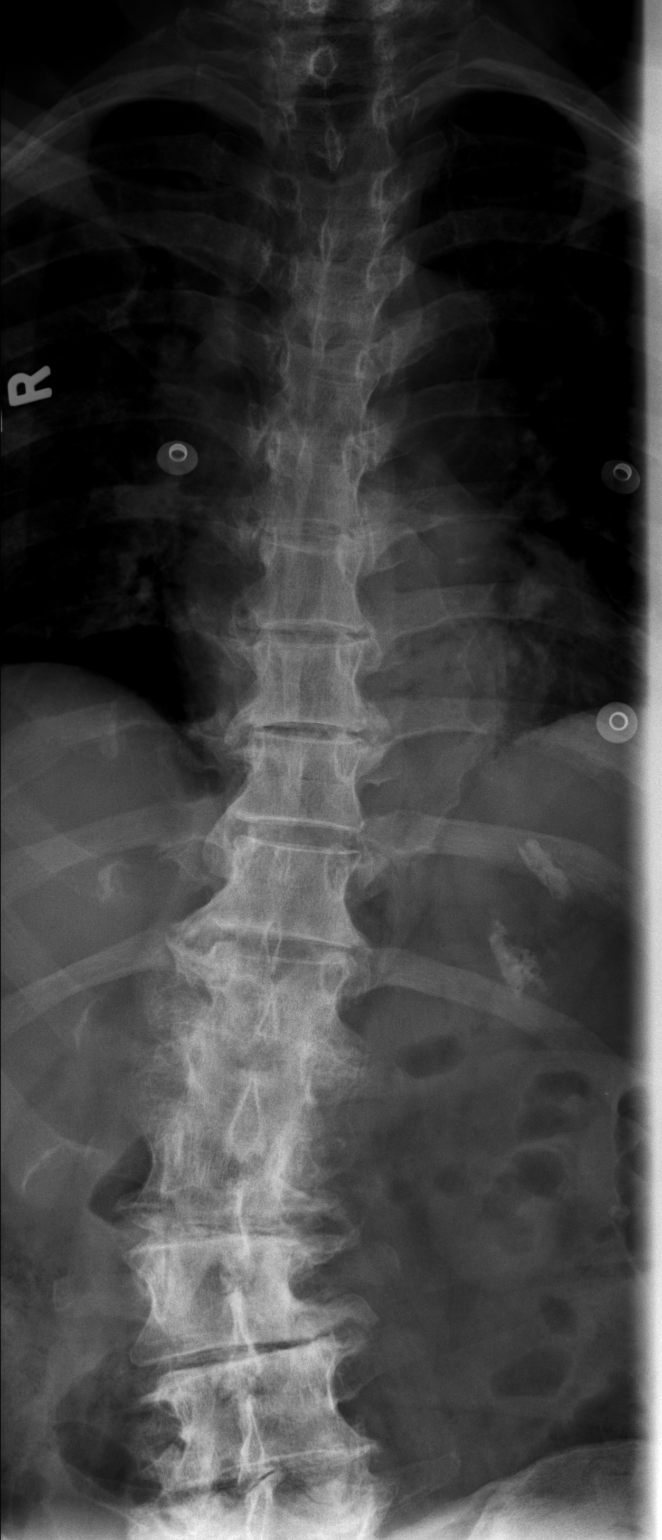

[t thoracic spine lat]
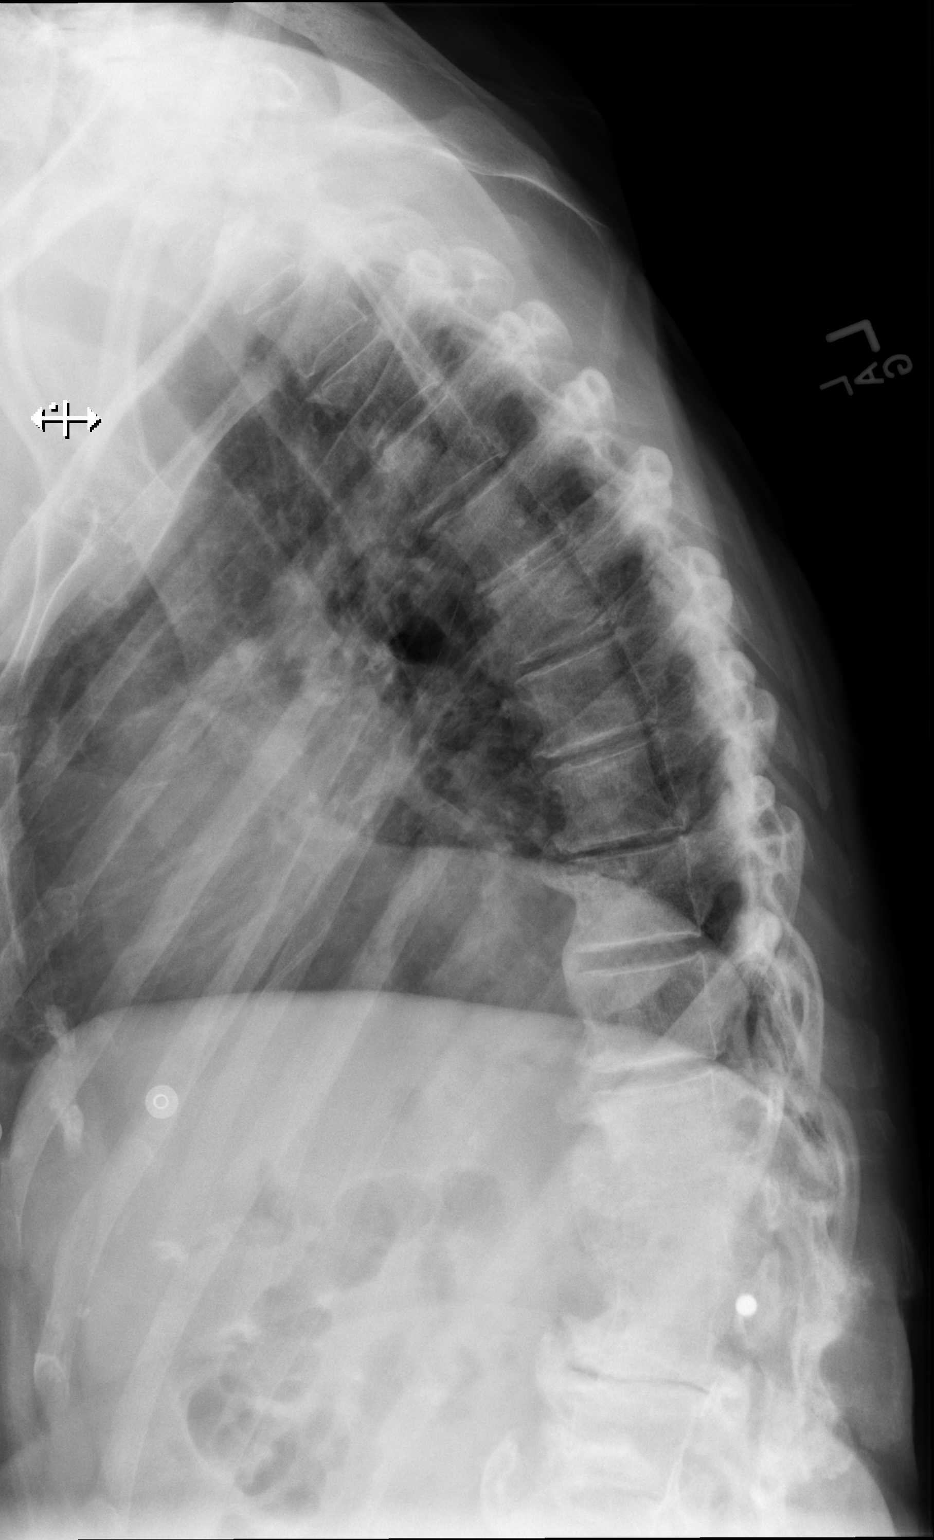

[t thoracic swimmers]
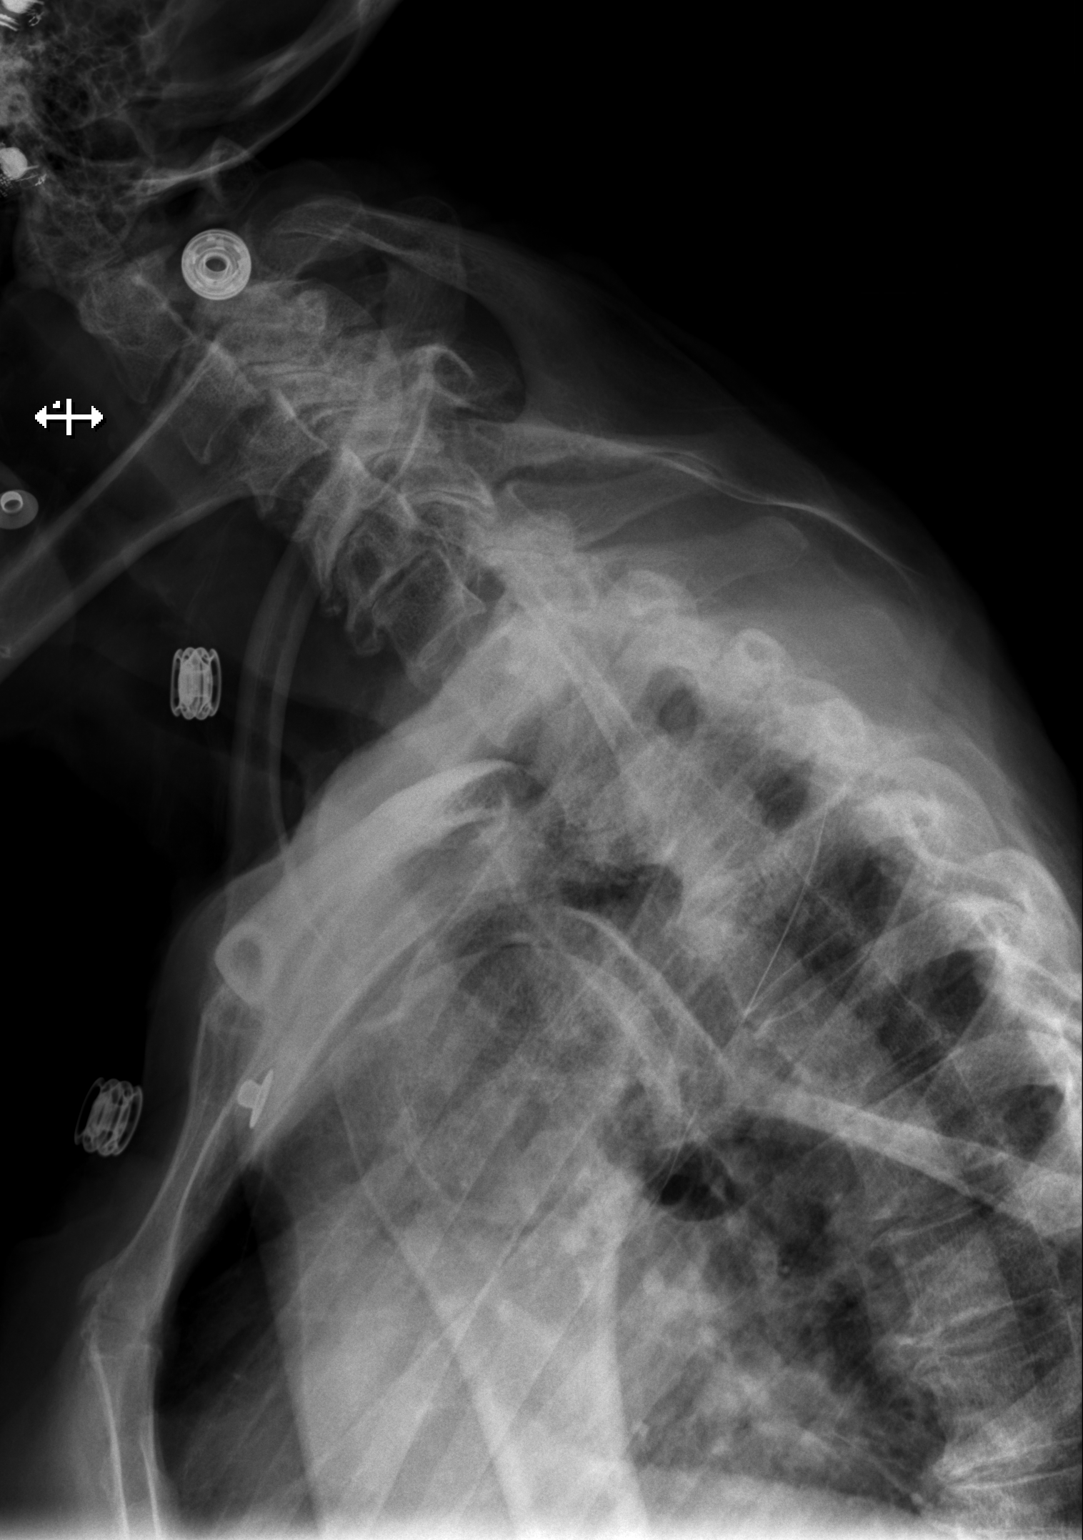

[3 of 3 positions shown; findings below may reference images not displayed]

FINDINGS: Focal dextroscoliosis of the upper lumbar spine with apex at L2 with
associated severe degenerative spondylolysis, grossly similar to
previous. Vertebral bodies otherwise normally aligned with
preservation of the normal thoracic kyphosis. No acute fracture
identified. Multilevel degenerative endplate spurring seen
throughout the thoracic spine. Degenerative changes noted within the
partially visualized cervical spine as well.

No acute soft tissue abnormality.
IMPRESSION: 1. No radiographic evidence for acute abnormality within the
thoracic spine.
2. Dextroscoliosis of the upper lumbar spine with associated severe
degenerative spondylolysis, grossly similar to previous.

## 2018-08-02 IMAGING — CR DG RIBS W/ CHEST 3+V*R*
3 series · 3 of 3 positions shown · non-contrast
Comparison: Prior radiograph from 05/28/2016.

CLINICAL DATA: Initial evaluation for right flank pain for 1 month
radiating to back. No injury.

EXAM:
RIGHT RIBS AND CHEST - 3+ VIEW

[t chest supine]
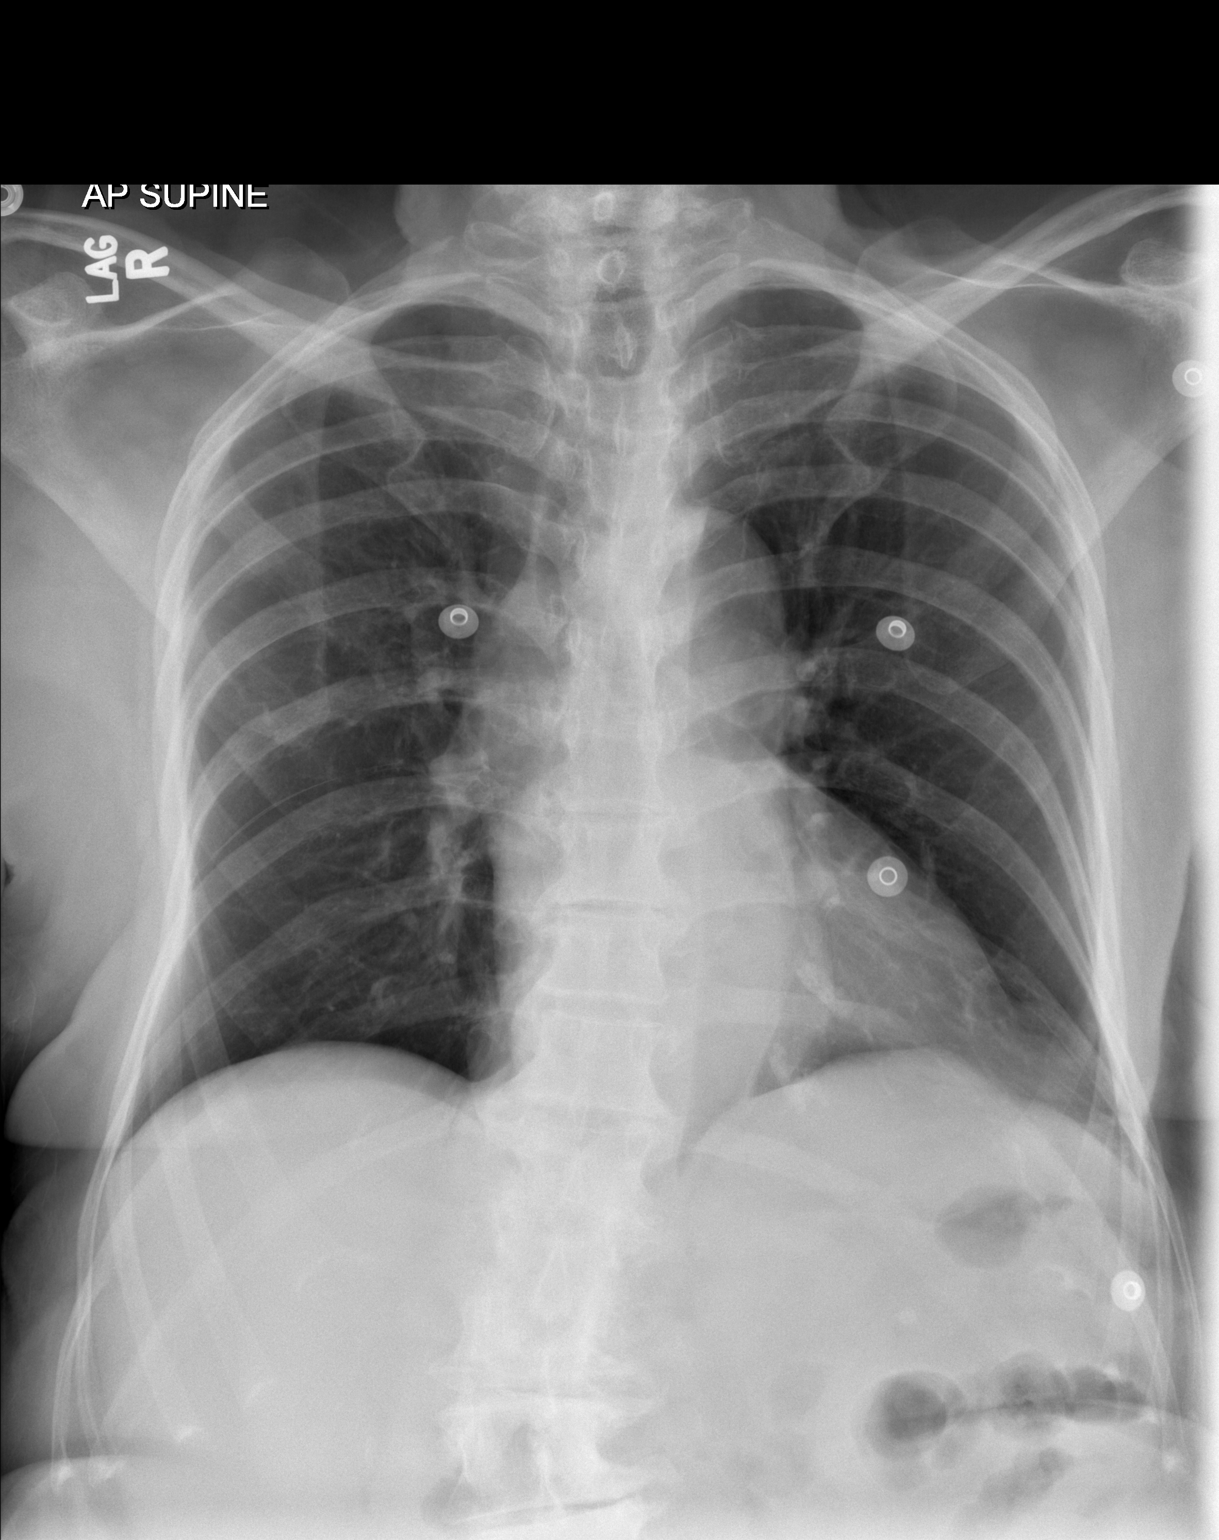

[t ribs rpo right (1 of 2)]
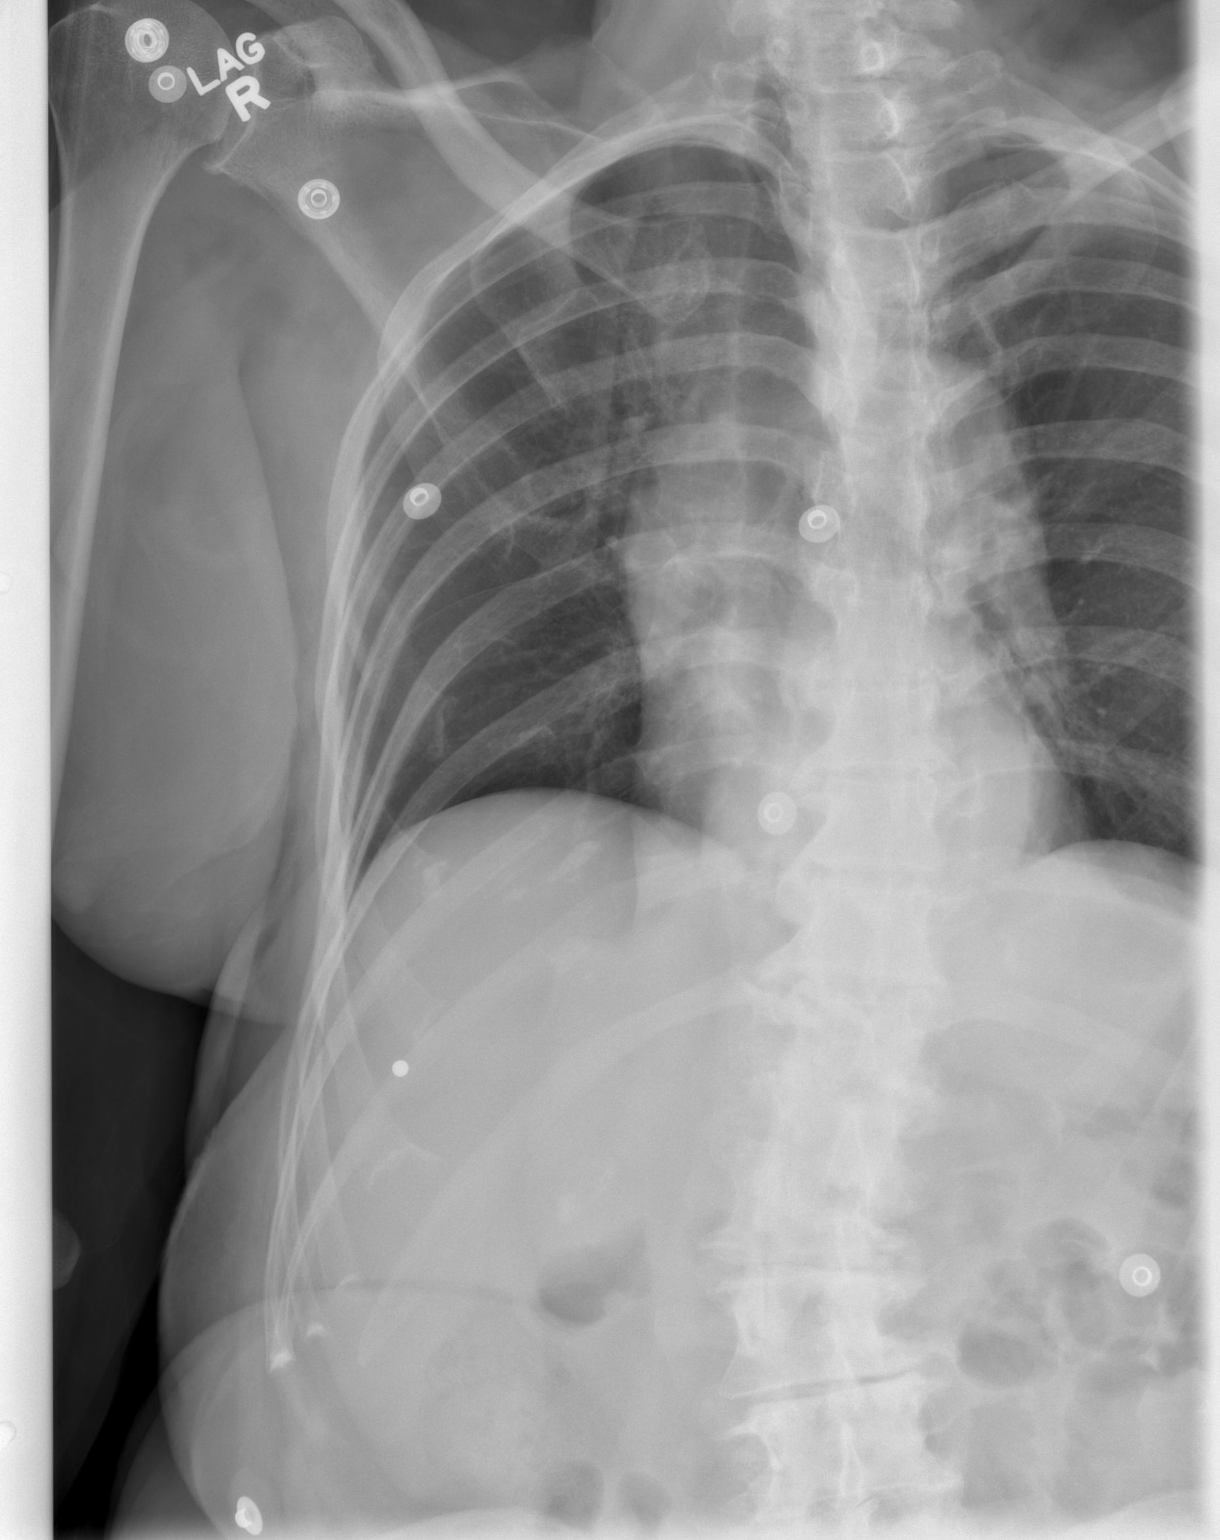

[t ribs rpo right (2 of 2)]
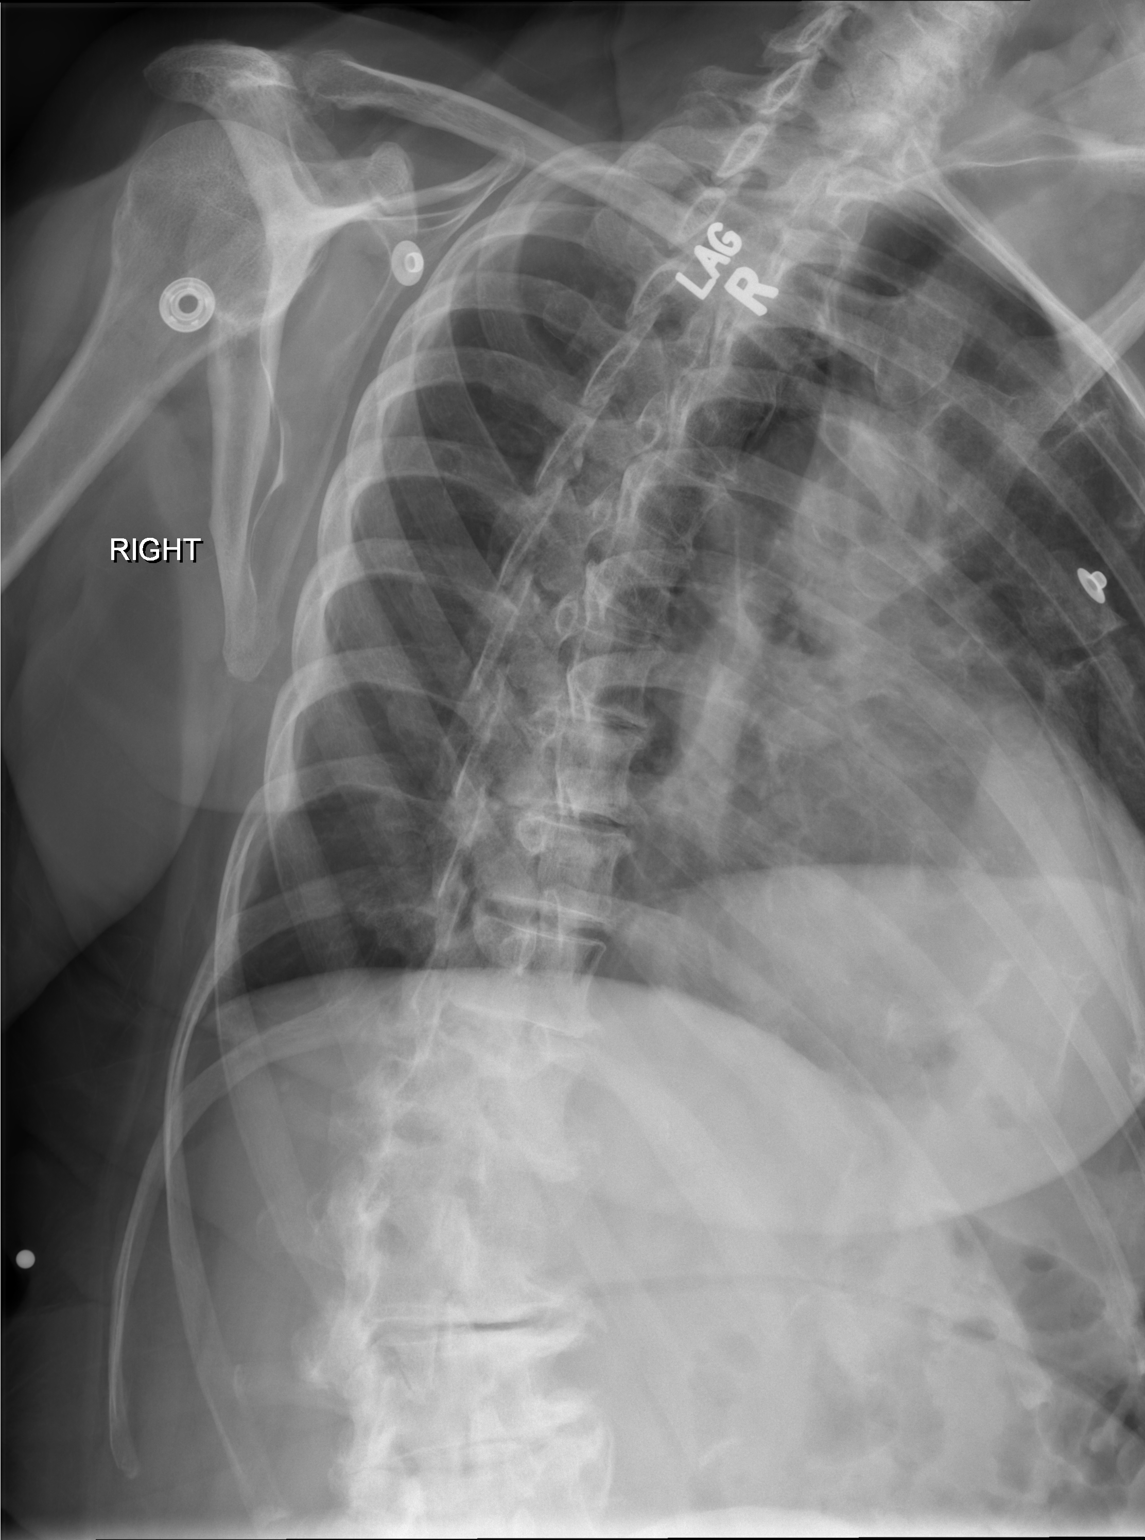

[3 of 3 positions shown; findings below may reference images not displayed]

FINDINGS: Cardiac and mediastinal silhouettes stable in size and contour, and
remain within normal limits.

Lungs normally inflated. No focal infiltrates. No pulmonary edema or
pleural effusion. No pneumothorax.

Metallic BB marker overlies the lower right ribs at site of pain. No
acute displaced rib fracture identified. No other acute osseous
abnormality.
IMPRESSION: 1. No acute displaced right-sided rib fracture identified.
2. No other active cardiopulmonary disease.

## 2018-09-30 DIAGNOSIS — I25111 Atherosclerotic heart disease of native coronary artery with angina pectoris with documented spasm: Secondary | ICD-10-CM | POA: Diagnosis not present

## 2018-09-30 DIAGNOSIS — R634 Abnormal weight loss: Secondary | ICD-10-CM | POA: Diagnosis not present

## 2018-09-30 DIAGNOSIS — R630 Anorexia: Secondary | ICD-10-CM | POA: Diagnosis not present

## 2018-09-30 DIAGNOSIS — N183 Chronic kidney disease, stage 3 (moderate): Secondary | ICD-10-CM | POA: Diagnosis not present

## 2018-09-30 DIAGNOSIS — I5032 Chronic diastolic (congestive) heart failure: Secondary | ICD-10-CM | POA: Diagnosis not present

## 2018-09-30 DIAGNOSIS — F322 Major depressive disorder, single episode, severe without psychotic features: Secondary | ICD-10-CM | POA: Diagnosis not present

## 2018-09-30 DIAGNOSIS — I10 Essential (primary) hypertension: Secondary | ICD-10-CM | POA: Diagnosis not present

## 2018-11-05 DIAGNOSIS — R634 Abnormal weight loss: Secondary | ICD-10-CM | POA: Diagnosis not present

## 2018-11-05 DIAGNOSIS — I25111 Atherosclerotic heart disease of native coronary artery with angina pectoris with documented spasm: Secondary | ICD-10-CM | POA: Diagnosis not present

## 2018-11-05 DIAGNOSIS — R630 Anorexia: Secondary | ICD-10-CM | POA: Diagnosis not present

## 2018-11-05 DIAGNOSIS — N390 Urinary tract infection, site not specified: Secondary | ICD-10-CM | POA: Diagnosis not present

## 2018-11-05 DIAGNOSIS — Z23 Encounter for immunization: Secondary | ICD-10-CM | POA: Diagnosis not present

## 2018-11-05 DIAGNOSIS — I5032 Chronic diastolic (congestive) heart failure: Secondary | ICD-10-CM | POA: Diagnosis not present

## 2018-11-05 DIAGNOSIS — I10 Essential (primary) hypertension: Secondary | ICD-10-CM | POA: Diagnosis not present

## 2018-11-05 DIAGNOSIS — F322 Major depressive disorder, single episode, severe without psychotic features: Secondary | ICD-10-CM | POA: Diagnosis not present

## 2018-11-16 NOTE — Progress Notes (Signed)
Cardiology Office Note   Date:  11/17/2018   ID:  Adore, Thurgood 1916/02/02, MRN [id number]  PCP:  Josetta Huddle, MD    No chief complaint on file.  HTN  Wt Readings from Last 3 Encounters:  11/17/18 131 lb 12.8 oz (59.8 kg)  02/26/18 132 lb 12.8 oz (60.2 kg)  01/01/18 134 lb 0.6 oz (60.8 kg)       History of Present Illness: [name] is a 85 y.o. female   who had an abnormal stress test several years ago. She has been medically managed for this.  She has had chronic DOE.  She was hospitalized in August 2016 for UTI which led to Escherichia coli bacteremia.  Echo in 5/18: Left ventricle: Abnormal septal motion. The cavity size was mildly dilated. Wall thickness was normal. Systolic function was mildly reduced. The estimated ejection fraction was in the range of 45% to 50%. Doppler parameters are consistent with both elevated ventricular end-diastolic filling pressure and elevated left atrial filling pressure. - Mitral valve: There was mild regurgitation. - Atrial septum: No defect or patent foramen ovale was identified. - Tricuspid valve: There was mild-moderate regurgitation. - Pulmonary arteries: PA peak pressure: 42 mm Hg (S).  She was hospitalized for GI bleed in July 2018.  She had UGI, no EGD due to age.  She feels that she has not gotten back to the level where she was.    SHe was in rehab.  She had HHPT.  Since the last visit, she turned 89.  She has been feeling unwell. SHe is taking Nitrofurantoin.    Denies : Chest pain. Dizziness. Leg edema. Nitroglycerin use. Orthopnea. Palpitations. Paroxysmal nocturnal dyspnea. Shortness of breath. Syncope.   Walks in the house.  Does well with use.  She uses her walker to avoid falls.    Past Medical History:  Diagnosis Date  . Allergic rhinitis   . CAD (coronary artery disease)   . CHF (congestive heart failure) ([place])   . CKD (chronic kidney disease) stage 3, GFR 30-59  ml/min 09/12/2014  . Diverticular disease   . Diverticulosis   . DJD (degenerative joint disease) of knee   . DJD (degenerative joint disease) of lumbar spine    scoliosis  . Eczema   . Esophageal erosions    from nsaid   . GI bleed   . Hormone replacement therapy (postmenopausal)   . Hyperglycemia   . Hyperlipidemia   . Hypertension   . Pneumonia    july 2009  . Pulmonary hypertension ([place])    a. 2D echo 03/2008 showed normal biventricular thickness, chamber size and systolic function, EF 99991111, mild mitral regurgitation, aortic sclerosis, moderate pulmonary HTN, pulmonic regurgitation.  Marland Kitchen PVC's (premature ventricular contractions)   . Reactive depression (situational)   . Sciatica    right lower extremity  . Shingles    right thoracic 2008  . Vertigo     Past Surgical History:  Procedure Laterality Date  . APPENDECTOMY    . BACK SURGERY    . BREAST LUMPECTOMY    . FLEXIBLE SIGMOIDOSCOPY Left 08/13/2013   Procedure: FLEXIBLE SIGMOIDOSCOPY;  Surgeon: Arta Silence, MD;  Location: [facility];  Service: Endoscopy;  Laterality: Left;  . TOTAL ABDOMINAL HYSTERECTOMY       Current Outpatient Medications  Medication Sig Dispense Refill  . citalopram (CELEXA) 20 MG tablet TK 1 T PO QD FOR MOOD OR ANXIETY    . clotrimazole (LOTRIMIN) 1 % cream  Apply 1 application topically 2 (two) times daily.     . furosemide (LASIX) 40 MG tablet Take 0.5 tablets by mouth. Take 0.5 tablet by mouth Monday, Wednesday, Friday  0  . isosorbide mononitrate (IMDUR) 30 MG 24 hr tablet TAKE 1 TABLET BY MOUTH ONCE DAILY 90 tablet 2  . loperamide (IMODIUM) 2 MG capsule Take 1 capsule (2 mg total) by mouth as needed for diarrhea or loose stools. 30 capsule 0  . meclizine (ANTIVERT) 25 MG tablet Take 25 mg by mouth 2 (two) times daily as needed for dizziness or nausea.     . metoprolol succinate (TOPROL-XL) 25 MG 24 hr tablet Take 25 mg by mouth 2 (two) times daily.    . pantoprazole (PROTONIX) 40 MG  tablet Take 40 mg by mouth daily as needed (indigestion).   2  . polyethylene glycol (MIRALAX) packet Take 17 g by mouth daily as needed for mild constipation. 14 each 0  . Polyvinyl Alcohol-Povidone (REFRESH OP) Apply 1 drop to eye 2 (two) times daily.    . potassium chloride SA (K-DUR,KLOR-CON) 20 MEQ tablet Take 20 mEq by mouth 2 (two) times daily.    Marland Kitchen PREMARIN 0.3 MG tablet Take 0.3 mg daily by mouth.  0  . Prenatal Vit-Fe Fumarate-FA (PRENATAL PO) Take 1 tablet by mouth daily.    . RESTASIS 0.05 % ophthalmic emulsion Place 1 drop into both eyes 2 (two) times daily.   4   No current facility-administered medications for this visit.     Allergies:   Ace inhibitors, Streptomycin, Tramadol, Vesicare [solifenacin succinate], and Penicillins    Social History:  The patient  reports that she has never smoked. She has never used smokeless tobacco. She reports current alcohol use. She reports that she does not use drugs.   Family History:  The patient's family history includes Cancer in her brother; Heart attack in her brother, father, mother, and sister; Heart disease in her daughter, father, mother, sister, and son; Hypertension in her brother.    ROS:  Please see the history of present illness.   Otherwise, review of systems are positive for .   All other systems are reviewed and negative.    PHYSICAL EXAM: VS:  BP 132/82   Pulse (!) 56   Ht 5\' 1"  (1.549 m)   Wt 131 lb 12.8 oz (59.8 kg)   SpO2 97%   BMI 24.90 kg/m  , BMI Body mass index is 24.9 kg/m. GEN: Well nourished, well developed, in no acute distress  HEENT: normal  Neck: no JVD, carotid bruits, or masses Cardiac: RRR; no murmurs, rubs, or gallops,no edema  Respiratory:  clear to auscultation bilaterally, normal work of breathing GI: soft, nontender, nondistended, + BS MS: no deformity or atrophy  Skin: warm and dry, no rash Neuro:  Strength and sensation are intact Psych: euthymic mood, full affect   EKG:   The ekg  ordered today demonstrates sinus bradycardia, nonspevific ST changes   Recent Labs: 01/01/2018: ALT 10 01/03/2018: BUN 11; Creatinine, Ser 0.88; Hemoglobin 12.0; Platelets 165; Potassium 3.8; Sodium 140   Lipid Panel No results found for: CHOL, TRIG, HDL, CHOLHDL, VLDL, LDLCALC, LDLDIRECT   Other studies Reviewed: Additional studies/ records that were reviewed today with results demonstrating: Cr 0.83 in June 2020.   ASSESSMENT AND PLAN:  1. Abnormal stress test: No angina.  Continue medical therapy  2. HTN: Given controlled BP and low HR, decrease Toprol to 25 mg daily.  If BP  stays low, could try to stop metoprolol.  If BP increase, would try to use meds that do not slow HR. 3. Palpitations: PVCs in the past. No sx at this time.   Current medicines are reviewed at length with the patient today.  The patient concerns regarding her medicines were addressed.  The following changes have been made:  No changeas above  Labs/ tests ordered today include:  No orders of the defined types were placed in this encounter.   Recommend 150 minutes/week of aerobic exercise Low fat, low carb, high fiber diet recommended  Disposition:   FU in 6 months, or sooner if BP is off   Signed, Larae Grooms, MD  11/17/2018 2:48 PM    [facility] [place], [place], [place]  [address] Phone: [phone]; Fax: [phone]

## 2018-11-17 ENCOUNTER — Encounter

## 2018-11-17 ENCOUNTER — Ambulatory Visit (INDEPENDENT_AMBULATORY_CARE_PROVIDER_SITE_OTHER): Payer: Medicare Other | Admitting: Interventional Cardiology

## 2018-11-17 ENCOUNTER — Other Ambulatory Visit: Payer: Self-pay

## 2018-11-17 ENCOUNTER — Encounter: Payer: Self-pay | Admitting: Interventional Cardiology

## 2018-11-17 VITALS — BP 132/82 | HR 56 | Ht 61.0 in | Wt 131.8 lb

## 2018-11-17 DIAGNOSIS — R002 Palpitations: Secondary | ICD-10-CM | POA: Diagnosis not present

## 2018-11-17 DIAGNOSIS — I493 Ventricular premature depolarization: Secondary | ICD-10-CM

## 2018-11-17 DIAGNOSIS — R9439 Abnormal result of other cardiovascular function study: Secondary | ICD-10-CM | POA: Diagnosis not present

## 2018-11-17 DIAGNOSIS — I10 Essential (primary) hypertension: Secondary | ICD-10-CM

## 2018-11-17 MED ORDER — METOPROLOL SUCCINATE ER 25 MG PO TB24: 25.0000 mg | ORAL_TABLET | Freq: Every day | ORAL | 3 refills | Status: DC

## 2018-11-17 NOTE — Patient Instructions (Signed)
Medication Instructions:  Your physician has recommended you make the following change in your medication:   DECREASE: metoprolol succinate (Toprol-XL) to 25 mg ONCE a day  *If you need a refill on your cardiac medications before your next appointment, please call your pharmacy*  Lab Work: None ordered  If you have labs (blood work) drawn today and your tests are completely normal, you will receive your results only by: Marland Kitchen MyChart Message (if you have MyChart) OR . A paper copy in the mail If you have any lab test that is abnormal or we need to change your treatment, we will call you to review the results.  Testing/Procedures: None ordered  Follow-Up: At [facility], you and your health needs are our priority.  As part of our continuing mission to provide you with exceptional heart care, we have created designated Provider Care Teams.  These Care Teams include your primary Cardiologist (physician) and Advanced Practice Providers (APPs -  Physician Assistants and Nurse Practitioners) who all work together to provide you with the care you need, when you need it.  Your next appointment:   6 months  The format for your next appointment:   In Person  Provider:   You may see Larae Grooms, MD or one of the following Advanced Practice Providers on your designated Care Team:    Melina Copa, PA-C  Ermalinda Barrios, PA-C   Other Instructions Call if you Blood Pressures are elevated

## 2018-12-01 DIAGNOSIS — H35033 Hypertensive retinopathy, bilateral: Secondary | ICD-10-CM | POA: Diagnosis not present

## 2018-12-01 DIAGNOSIS — H0288A Meibomian gland dysfunction right eye, upper and lower eyelids: Secondary | ICD-10-CM | POA: Diagnosis not present

## 2018-12-01 DIAGNOSIS — H04123 Dry eye syndrome of bilateral lacrimal glands: Secondary | ICD-10-CM | POA: Diagnosis not present

## 2018-12-01 DIAGNOSIS — Z961 Presence of intraocular lens: Secondary | ICD-10-CM | POA: Diagnosis not present

## 2018-12-31 DIAGNOSIS — R21 Rash and other nonspecific skin eruption: Secondary | ICD-10-CM | POA: Diagnosis not present

## 2018-12-31 DIAGNOSIS — R630 Anorexia: Secondary | ICD-10-CM | POA: Diagnosis not present

## 2018-12-31 DIAGNOSIS — N390 Urinary tract infection, site not specified: Secondary | ICD-10-CM | POA: Diagnosis not present

## 2018-12-31 DIAGNOSIS — I5032 Chronic diastolic (congestive) heart failure: Secondary | ICD-10-CM | POA: Diagnosis not present

## 2018-12-31 DIAGNOSIS — I25111 Atherosclerotic heart disease of native coronary artery with angina pectoris with documented spasm: Secondary | ICD-10-CM | POA: Diagnosis not present

## 2018-12-31 DIAGNOSIS — R634 Abnormal weight loss: Secondary | ICD-10-CM | POA: Diagnosis not present

## 2018-12-31 DIAGNOSIS — I10 Essential (primary) hypertension: Secondary | ICD-10-CM | POA: Diagnosis not present

## 2018-12-31 DIAGNOSIS — F322 Major depressive disorder, single episode, severe without psychotic features: Secondary | ICD-10-CM | POA: Diagnosis not present

## 2019-01-19 ENCOUNTER — Other Ambulatory Visit: Payer: Self-pay | Admitting: Internal Medicine

## 2019-01-19 ENCOUNTER — Ambulatory Visit
Admission: RE | Admit: 2019-01-19 | Discharge: 2019-01-19 | Disposition: A | Discharge status: Home / Self Care | Payer: Medicare Other | Source: Ambulatory Visit | Attending: Internal Medicine | Admitting: Internal Medicine

## 2019-01-19 DIAGNOSIS — R109 Unspecified abdominal pain: Secondary | ICD-10-CM

## 2019-02-09 DIAGNOSIS — I10 Essential (primary) hypertension: Secondary | ICD-10-CM | POA: Diagnosis not present

## 2019-02-09 DIAGNOSIS — R194 Change in bowel habit: Secondary | ICD-10-CM | POA: Diagnosis not present

## 2019-02-09 DIAGNOSIS — A498 Other bacterial infections of unspecified site: Secondary | ICD-10-CM | POA: Diagnosis not present

## 2019-02-09 DIAGNOSIS — N390 Urinary tract infection, site not specified: Secondary | ICD-10-CM | POA: Diagnosis not present

## 2019-02-09 DIAGNOSIS — R1010 Upper abdominal pain, unspecified: Secondary | ICD-10-CM | POA: Diagnosis not present

## 2019-02-25 ENCOUNTER — Ambulatory Visit: Payer: Medicare Other

## 2019-03-05 ENCOUNTER — Ambulatory Visit: Payer: Medicare Other

## 2019-03-15 ENCOUNTER — Ambulatory Visit: Payer: Medicare Other | Attending: Internal Medicine

## 2019-03-18 ENCOUNTER — Ambulatory Visit: Payer: Medicare Other

## 2019-03-30 ENCOUNTER — Ambulatory Visit
Admission: RE | Admit: 2019-03-30 | Discharge: 2019-03-30 | Disposition: A | Discharge status: Home / Self Care | Payer: Medicare Other | Source: Ambulatory Visit | Attending: Internal Medicine | Admitting: Internal Medicine

## 2019-03-30 ENCOUNTER — Other Ambulatory Visit: Payer: Self-pay | Admitting: Internal Medicine

## 2019-03-30 DIAGNOSIS — R194 Change in bowel habit: Secondary | ICD-10-CM | POA: Diagnosis not present

## 2019-03-30 DIAGNOSIS — R0789 Other chest pain: Secondary | ICD-10-CM

## 2019-03-30 DIAGNOSIS — R8290 Unspecified abnormal findings in urine: Secondary | ICD-10-CM | POA: Diagnosis not present

## 2019-03-30 DIAGNOSIS — I10 Essential (primary) hypertension: Secondary | ICD-10-CM | POA: Diagnosis not present

## 2019-03-30 DIAGNOSIS — K449 Diaphragmatic hernia without obstruction or gangrene: Secondary | ICD-10-CM | POA: Diagnosis not present

## 2019-03-30 DIAGNOSIS — R634 Abnormal weight loss: Secondary | ICD-10-CM | POA: Diagnosis not present

## 2019-03-30 DIAGNOSIS — F322 Major depressive disorder, single episode, severe without psychotic features: Secondary | ICD-10-CM | POA: Diagnosis not present

## 2019-03-30 DIAGNOSIS — I25111 Atherosclerotic heart disease of native coronary artery with angina pectoris with documented spasm: Secondary | ICD-10-CM | POA: Diagnosis not present

## 2019-03-30 DIAGNOSIS — I5032 Chronic diastolic (congestive) heart failure: Secondary | ICD-10-CM | POA: Diagnosis not present

## 2019-03-30 DIAGNOSIS — R630 Anorexia: Secondary | ICD-10-CM | POA: Diagnosis not present

## 2019-04-14 DIAGNOSIS — R8290 Unspecified abnormal findings in urine: Secondary | ICD-10-CM | POA: Diagnosis not present

## 2019-04-14 DIAGNOSIS — R232 Flushing: Secondary | ICD-10-CM | POA: Diagnosis not present

## 2019-04-14 DIAGNOSIS — I10 Essential (primary) hypertension: Secondary | ICD-10-CM | POA: Diagnosis not present

## 2019-04-14 DIAGNOSIS — R0789 Other chest pain: Secondary | ICD-10-CM | POA: Diagnosis not present

## 2019-04-14 DIAGNOSIS — L602 Onychogryphosis: Secondary | ICD-10-CM | POA: Diagnosis not present

## 2019-04-14 DIAGNOSIS — R630 Anorexia: Secondary | ICD-10-CM | POA: Diagnosis not present

## 2019-04-14 DIAGNOSIS — R634 Abnormal weight loss: Secondary | ICD-10-CM | POA: Diagnosis not present

## 2019-04-14 DIAGNOSIS — I25111 Atherosclerotic heart disease of native coronary artery with angina pectoris with documented spasm: Secondary | ICD-10-CM | POA: Diagnosis not present

## 2019-04-14 DIAGNOSIS — F322 Major depressive disorder, single episode, severe without psychotic features: Secondary | ICD-10-CM | POA: Diagnosis not present

## 2019-04-14 DIAGNOSIS — R194 Change in bowel habit: Secondary | ICD-10-CM | POA: Diagnosis not present

## 2019-04-14 DIAGNOSIS — I5032 Chronic diastolic (congestive) heart failure: Secondary | ICD-10-CM | POA: Diagnosis not present

## 2019-05-16 NOTE — Progress Notes (Signed)
Cardiology Office Note   Date:  05/17/2019   ID:  [name], DOB [birth year], MRN [id number]  PCP:  Josetta Huddle, MD    No chief complaint on file.  Abnormal stress test  Wt Readings from Last 3 Encounters:  05/17/19 129 lb 12.8 oz (58.9 kg)  11/17/18 131 lb 12.8 oz (59.8 kg)  02/26/18 132 lb 12.8 oz (60.2 kg)       History of Present Illness: [name] is a 86 y.o. female  who had an abnormal stress test several years ago. She has been medically managed for this.She has had chronic DOE.  She was hospitalized in August 2016 for UTI which led to Escherichia coli bacteremia.  Echo in 5/18: Left ventricle: Abnormal septal motion. The cavity size was mildly dilated. Wall thickness was normal. Systolic function was mildly reduced. The estimated ejection fraction was in the range of 45% to 50%. Doppler parameters are consistent with both elevated ventricular end-diastolic filling pressure and elevated left atrial filling pressure. - Mitral valve: There was mild regurgitation. - Atrial septum: No defect or patent foramen ovale was identified. - Tricuspid valve: There was mild-moderate regurgitation. - Pulmonary arteries: PA peak pressure: 42 mm Hg (S).  She was hospitalized for GI bleed in July 2018. She had UGI, no EGD due to age. She feels that she has not gotten back to the level where she was.   SHe was in rehab. She had HHPT.  As of 2020, was using a walker in the house.   Since the last visit, she got her COVID vaccines.    Denies : Chest pain. Dizziness. Nitroglycerin use. Orthopnea. Palpitations. Paroxysmal nocturnal dyspnea. Shortness of breath. Syncope.   She reports fatigue and some weakness.  Leg edema when she keeps them down.        Past Medical History:  Diagnosis Date  . Allergic rhinitis   . CAD (coronary artery disease)   . CHF (congestive heart failure) ([place])   . CKD (chronic kidney disease) stage 3, GFR  30-59 ml/min 09/12/2014  . Diverticular disease   . Diverticulosis   . DJD (degenerative joint disease) of knee   . DJD (degenerative joint disease) of lumbar spine    scoliosis  . Eczema   . Esophageal erosions    from nsaid   . GI bleed   . Hormone replacement therapy (postmenopausal)   . Hyperglycemia   . Hyperlipidemia   . Hypertension   . Pneumonia    july 2009  . Pulmonary hypertension ([place])    a. 2D echo 03/2008 showed normal biventricular thickness, chamber size and systolic function, EF 99991111, mild mitral regurgitation, aortic sclerosis, moderate pulmonary HTN, pulmonic regurgitation.  Marland Kitchen PVC's (premature ventricular contractions)   . Reactive depression (situational)   . Sciatica    right lower extremity  . Shingles    right thoracic 2008  . Vertigo     Past Surgical History:  Procedure Laterality Date  . APPENDECTOMY    . BACK SURGERY    . BREAST LUMPECTOMY    . FLEXIBLE SIGMOIDOSCOPY Left 08/13/2013   Procedure: FLEXIBLE SIGMOIDOSCOPY;  Surgeon: Arta Silence, MD;  Location: [facility];  Service: Endoscopy;  Laterality: Left;  . TOTAL ABDOMINAL HYSTERECTOMY       Current Outpatient Medications  Medication Sig Dispense Refill  . amLODipine (NORVASC) 2.5 MG tablet Take 2.5 mg by mouth daily.    . citalopram (CELEXA) 20 MG tablet TK 1 T PO QD  FOR MOOD OR ANXIETY    . clotrimazole (LOTRIMIN) 1 % cream Apply 1 application topically 2 (two) times daily.     . furosemide (LASIX) 40 MG tablet Take 0.5 tablets by mouth. Take 0.5 tablet by mouth Monday, Wednesday, Friday  0  . isosorbide mononitrate (IMDUR) 30 MG 24 hr tablet TAKE 1 TABLET BY MOUTH ONCE DAILY 90 tablet 2  . loperamide (IMODIUM) 2 MG capsule Take 1 capsule (2 mg total) by mouth as needed for diarrhea or loose stools. 30 capsule 0  . meclizine (ANTIVERT) 25 MG tablet Take 25 mg by mouth 2 (two) times daily as needed for dizziness or nausea.     . metoprolol succinate (TOPROL-XL) 25 MG 24 hr tablet Take  1 tablet (25 mg total) by mouth daily. 90 tablet 3  . pantoprazole (PROTONIX) 40 MG tablet Take 40 mg by mouth daily as needed (indigestion).   2  . polyethylene glycol (MIRALAX) packet Take 17 g by mouth daily as needed for mild constipation. 14 each 0  . Polyvinyl Alcohol-Povidone (REFRESH OP) Apply 1 drop to eye 2 (two) times daily.    . potassium chloride SA (K-DUR,KLOR-CON) 20 MEQ tablet Take 20 mEq by mouth 2 (two) times daily.    Marland Kitchen PREMARIN 0.3 MG tablet Take 0.3 mg daily by mouth.  0  . Prenatal Vit-Fe Fumarate-FA (PRENATAL PO) Take 1 tablet by mouth daily.    . RESTASIS 0.05 % ophthalmic emulsion Place 1 drop into both eyes 2 (two) times daily.   4   No current facility-administered medications for this visit.    Allergies:   Ace inhibitors, Streptomycin, Tramadol, Vesicare [solifenacin succinate], and Penicillins    Social History:  The patient  reports that she has never smoked. She has never used smokeless tobacco. She reports current alcohol use. She reports that she does not use drugs.   Family History:  The patient's family history includes Cancer in her brother; Heart attack in her brother, father, mother, and sister; Heart disease in her daughter, father, mother, sister, and son; Hypertension in her brother.    ROS:  Please see the history of present illness.   Otherwise, review of systems are positive for fatigue.   All other systems are reviewed and negative.    PHYSICAL EXAM: VS:  BP 124/60   Pulse 73   Ht 5\' 1"  (1.549 m)   Wt 129 lb 12.8 oz (58.9 kg)   SpO2 95%   BMI 24.53 kg/m  , BMI Body mass index is 24.53 kg/m. GEN: Well nourished, well developed, in no acute distress  HEENT: normal  Neck: no JVD, carotid bruits, or masses Cardiac: RRR; no murmurs, rubs, or gallops,; bilateral ankle edema  Respiratory:  clear to auscultation bilaterally, normal work of breathing GI: soft, nontender, nondistended, + BS MS: no deformity or atrophy  Skin: warm and dry, no  rash Neuro:  Strength and sensation are intact Psych: euthymic mood, full affect   EKG:   The ekg ordered 10/2018 demonstrates NSR, nonspecific ST changes   Recent Labs: No results found for requested labs within last 8760 hours.   Lipid Panel No results found for: CHOL, TRIG, HDL, CHOLHDL, VLDL, LDLCALC, LDLDIRECT   Other studies Reviewed: Additional studies/ records that were reviewed today with results demonstrating: PMD labs from 3/21 reviewed.   ASSESSMENT AND PLAN:  1. Abnormal stress test: medical therapy for angina. Tolerating current dose of metoprolol.  No longer checking lipids 2. HTN: Decreased metoprolol  in the past due to slower HR.  The current medical regimen is effective;  continue present plan and medications. 3. Palpitations: PVCs in the past. No sx at this time.  4. Fatigue likely age related.  Avoid falls.    Current medicines are reviewed at length with the patient today.  The patient concerns regarding her medicines were addressed.  The following changes have been made:  No change  Labs/ tests ordered today include:  No orders of the defined types were placed in this encounter.   Recommend 150 minutes/week of aerobic exercise Low fat, low carb, high fiber diet recommended  Disposition:   FU in 6 months   Signed, Larae Grooms, MD  05/17/2019 10:45 AM    [address], [place], [place]  [address] Phone: [phone]; Fax: [phone]

## 2019-05-17 ENCOUNTER — Other Ambulatory Visit: Payer: Self-pay

## 2019-05-17 ENCOUNTER — Ambulatory Visit (INDEPENDENT_AMBULATORY_CARE_PROVIDER_SITE_OTHER): Payer: Medicare Other | Admitting: Interventional Cardiology

## 2019-05-17 ENCOUNTER — Encounter: Payer: Self-pay | Admitting: Interventional Cardiology

## 2019-05-17 VITALS — BP 124/60 | HR 73 | Ht 61.0 in | Wt 129.8 lb

## 2019-05-17 DIAGNOSIS — R002 Palpitations: Secondary | ICD-10-CM

## 2019-05-17 DIAGNOSIS — R9439 Abnormal result of other cardiovascular function study: Secondary | ICD-10-CM | POA: Diagnosis not present

## 2019-05-17 DIAGNOSIS — I493 Ventricular premature depolarization: Secondary | ICD-10-CM

## 2019-05-17 DIAGNOSIS — I10 Essential (primary) hypertension: Secondary | ICD-10-CM

## 2019-05-17 NOTE — Patient Instructions (Signed)
Medication Instructions:  Your physician recommends that you continue on your current medications as directed. Please refer to the Current Medication list given to you today.  *If you need a refill on your cardiac medications before your next appointment, please call your pharmacy*   Lab Work: None ordered  If you have labs (blood work) drawn today and your tests are completely normal, you will receive your results only by: . MyChart Message (if you have MyChart) OR . A paper copy in the mail If you have any lab test that is abnormal or we need to change your treatment, we will call you to review the results.   Testing/Procedures: None ordered   Follow-Up: At [facility], you and your health needs are our priority.  As part of our continuing mission to provide you with exceptional heart care, we have created designated Provider Care Teams.  These Care Teams include your primary Cardiologist (physician) and Advanced Practice Providers (APPs -  Physician Assistants and Nurse Practitioners) who all work together to provide you with the care you need, when you need it.  We recommend signing up for the patient portal called "MyChart".  Sign up information is provided on this After Visit Summary.  MyChart is used to connect with patients for Virtual Visits (Telemedicine).  Patients are able to view lab/test results, encounter notes, upcoming appointments, etc.  Non-urgent messages can be sent to your provider as well.   To learn more about what you can do with MyChart, go to [email or web address].    Your next appointment:   6 month(s)  The format for your next appointment:   In Person  Provider:   You may see Jayadeep Varanasi, MD or one of the following Advanced Practice Providers on your designated Care Team:    Dayna Dunn, PA-C  Michele Lenze, PA-C    Other Instructions   

## 2019-06-18 DIAGNOSIS — H04123 Dry eye syndrome of bilateral lacrimal glands: Secondary | ICD-10-CM | POA: Diagnosis not present

## 2019-06-18 DIAGNOSIS — H0288A Meibomian gland dysfunction right eye, upper and lower eyelids: Secondary | ICD-10-CM | POA: Diagnosis not present

## 2019-06-18 DIAGNOSIS — H35033 Hypertensive retinopathy, bilateral: Secondary | ICD-10-CM | POA: Diagnosis not present

## 2019-06-18 DIAGNOSIS — Z961 Presence of intraocular lens: Secondary | ICD-10-CM | POA: Diagnosis not present

## 2019-06-22 DIAGNOSIS — H903 Sensorineural hearing loss, bilateral: Secondary | ICD-10-CM | POA: Diagnosis not present

## 2019-08-06 DIAGNOSIS — L84 Corns and callosities: Secondary | ICD-10-CM | POA: Diagnosis not present

## 2019-08-06 DIAGNOSIS — R21 Rash and other nonspecific skin eruption: Secondary | ICD-10-CM | POA: Diagnosis not present

## 2019-08-06 DIAGNOSIS — R410 Disorientation, unspecified: Secondary | ICD-10-CM | POA: Diagnosis not present

## 2019-08-06 DIAGNOSIS — W19XXXA Unspecified fall, initial encounter: Secondary | ICD-10-CM | POA: Diagnosis not present

## 2019-08-06 DIAGNOSIS — N3000 Acute cystitis without hematuria: Secondary | ICD-10-CM | POA: Diagnosis not present

## 2019-08-09 ENCOUNTER — Emergency Department (HOSPITAL_COMMUNITY): Payer: Medicare Other

## 2019-08-09 ENCOUNTER — Inpatient Hospital Stay (HOSPITAL_COMMUNITY)
Admission: EM | Admit: 2019-08-09 | Discharge: 2019-08-13 | DRG: 082 | Disposition: A | Discharge status: Skilled Nursing Facility | Payer: Medicare Other | Attending: Internal Medicine | Admitting: Internal Medicine

## 2019-08-09 ENCOUNTER — Encounter (HOSPITAL_COMMUNITY): Payer: Self-pay | Admitting: Emergency Medicine

## 2019-08-09 ENCOUNTER — Other Ambulatory Visit: Payer: Self-pay

## 2019-08-09 DIAGNOSIS — G319 Degenerative disease of nervous system, unspecified: Secondary | ICD-10-CM | POA: Diagnosis not present

## 2019-08-09 DIAGNOSIS — M1712 Unilateral primary osteoarthritis, left knee: Secondary | ICD-10-CM | POA: Diagnosis not present

## 2019-08-09 DIAGNOSIS — S065X9A Traumatic subdural hemorrhage with loss of consciousness of unspecified duration, initial encounter: Secondary | ICD-10-CM | POA: Diagnosis not present

## 2019-08-09 DIAGNOSIS — M25462 Effusion, left knee: Secondary | ICD-10-CM | POA: Diagnosis not present

## 2019-08-09 DIAGNOSIS — R5381 Other malaise: Secondary | ICD-10-CM

## 2019-08-09 DIAGNOSIS — I130 Hypertensive heart and chronic kidney disease with heart failure and stage 1 through stage 4 chronic kidney disease, or unspecified chronic kidney disease: Secondary | ICD-10-CM | POA: Diagnosis present

## 2019-08-09 DIAGNOSIS — R519 Headache, unspecified: Secondary | ICD-10-CM | POA: Diagnosis not present

## 2019-08-09 DIAGNOSIS — N179 Acute kidney failure, unspecified: Secondary | ICD-10-CM | POA: Diagnosis present

## 2019-08-09 DIAGNOSIS — I878 Other specified disorders of veins: Secondary | ICD-10-CM | POA: Diagnosis present

## 2019-08-09 DIAGNOSIS — W19XXXS Unspecified fall, sequela: Secondary | ICD-10-CM

## 2019-08-09 DIAGNOSIS — Z20822 Contact with and (suspected) exposure to covid-19: Secondary | ICD-10-CM | POA: Diagnosis not present

## 2019-08-09 DIAGNOSIS — I10 Essential (primary) hypertension: Secondary | ICD-10-CM | POA: Diagnosis not present

## 2019-08-09 DIAGNOSIS — Z8249 Family history of ischemic heart disease and other diseases of the circulatory system: Secondary | ICD-10-CM

## 2019-08-09 DIAGNOSIS — M170 Bilateral primary osteoarthritis of knee: Secondary | ICD-10-CM | POA: Diagnosis not present

## 2019-08-09 DIAGNOSIS — Z79899 Other long term (current) drug therapy: Secondary | ICD-10-CM

## 2019-08-09 DIAGNOSIS — M25561 Pain in right knee: Secondary | ICD-10-CM | POA: Diagnosis not present

## 2019-08-09 DIAGNOSIS — N1831 Chronic kidney disease, stage 3a: Secondary | ICD-10-CM | POA: Diagnosis present

## 2019-08-09 DIAGNOSIS — I6202 Nontraumatic subacute subdural hemorrhage: Secondary | ICD-10-CM | POA: Diagnosis not present

## 2019-08-09 DIAGNOSIS — R52 Pain, unspecified: Secondary | ICD-10-CM | POA: Diagnosis not present

## 2019-08-09 DIAGNOSIS — I2510 Atherosclerotic heart disease of native coronary artery without angina pectoris: Secondary | ICD-10-CM | POA: Diagnosis present

## 2019-08-09 DIAGNOSIS — F28 Other psychotic disorder not due to a substance or known physiological condition: Secondary | ICD-10-CM | POA: Diagnosis present

## 2019-08-09 DIAGNOSIS — G9340 Encephalopathy, unspecified: Secondary | ICD-10-CM | POA: Diagnosis present

## 2019-08-09 DIAGNOSIS — S065X0A Traumatic subdural hemorrhage without loss of consciousness, initial encounter: Secondary | ICD-10-CM | POA: Diagnosis not present

## 2019-08-09 DIAGNOSIS — I5032 Chronic diastolic (congestive) heart failure: Secondary | ICD-10-CM | POA: Diagnosis not present

## 2019-08-09 DIAGNOSIS — G9389 Other specified disorders of brain: Secondary | ICD-10-CM | POA: Diagnosis not present

## 2019-08-09 DIAGNOSIS — Z66 Do not resuscitate: Secondary | ICD-10-CM | POA: Diagnosis present

## 2019-08-09 DIAGNOSIS — S8991XA Unspecified injury of right lower leg, initial encounter: Secondary | ICD-10-CM | POA: Diagnosis not present

## 2019-08-09 DIAGNOSIS — N390 Urinary tract infection, site not specified: Secondary | ICD-10-CM | POA: Diagnosis present

## 2019-08-09 DIAGNOSIS — T380X5A Adverse effect of glucocorticoids and synthetic analogues, initial encounter: Secondary | ICD-10-CM | POA: Diagnosis not present

## 2019-08-09 DIAGNOSIS — Z7189 Other specified counseling: Secondary | ICD-10-CM

## 2019-08-09 DIAGNOSIS — Y92239 Unspecified place in hospital as the place of occurrence of the external cause: Secondary | ICD-10-CM | POA: Diagnosis not present

## 2019-08-09 DIAGNOSIS — K449 Diaphragmatic hernia without obstruction or gangrene: Secondary | ICD-10-CM | POA: Diagnosis not present

## 2019-08-09 DIAGNOSIS — G9341 Metabolic encephalopathy: Secondary | ICD-10-CM | POA: Diagnosis not present

## 2019-08-09 DIAGNOSIS — Z888 Allergy status to other drugs, medicaments and biological substances status: Secondary | ICD-10-CM

## 2019-08-09 DIAGNOSIS — F419 Anxiety disorder, unspecified: Secondary | ICD-10-CM | POA: Diagnosis present

## 2019-08-09 DIAGNOSIS — I951 Orthostatic hypotension: Secondary | ICD-10-CM

## 2019-08-09 DIAGNOSIS — Z880 Allergy status to penicillin: Secondary | ICD-10-CM

## 2019-08-09 DIAGNOSIS — S065XAA Traumatic subdural hemorrhage with loss of consciousness status unknown, initial encounter: Secondary | ICD-10-CM

## 2019-08-09 DIAGNOSIS — R079 Chest pain, unspecified: Secondary | ICD-10-CM | POA: Diagnosis not present

## 2019-08-09 DIAGNOSIS — N183 Chronic kidney disease, stage 3 unspecified: Secondary | ICD-10-CM | POA: Diagnosis present

## 2019-08-09 DIAGNOSIS — Z723 Lack of physical exercise: Secondary | ICD-10-CM | POA: Diagnosis not present

## 2019-08-09 DIAGNOSIS — I272 Pulmonary hypertension, unspecified: Secondary | ICD-10-CM | POA: Diagnosis present

## 2019-08-09 DIAGNOSIS — R64 Cachexia: Secondary | ICD-10-CM | POA: Diagnosis present

## 2019-08-09 DIAGNOSIS — R42 Dizziness and giddiness: Secondary | ICD-10-CM | POA: Diagnosis not present

## 2019-08-09 DIAGNOSIS — H9190 Unspecified hearing loss, unspecified ear: Secondary | ICD-10-CM | POA: Diagnosis present

## 2019-08-09 DIAGNOSIS — W19XXXA Unspecified fall, initial encounter: Secondary | ICD-10-CM | POA: Diagnosis present

## 2019-08-09 DIAGNOSIS — Z90710 Acquired absence of both cervix and uterus: Secondary | ICD-10-CM

## 2019-08-09 DIAGNOSIS — E785 Hyperlipidemia, unspecified: Secondary | ICD-10-CM | POA: Diagnosis present

## 2019-08-09 LAB — URINALYSIS, ROUTINE W REFLEX MICROSCOPIC
Bacteria, UA: NONE SEEN
Bilirubin Urine: NEGATIVE
Glucose, UA: NEGATIVE mg/dL
Ketones, ur: NEGATIVE mg/dL
Nitrite: NEGATIVE
Protein, ur: NEGATIVE mg/dL
Specific Gravity, Urine: 1.018 (ref 1.005–1.030)
pH: 5 (ref 5.0–8.0)

## 2019-08-09 LAB — COMPREHENSIVE METABOLIC PANEL
ALT: 9 U/L (ref 0–44)
AST: 20 U/L (ref 15–41)
Albumin: 3.2 g/dL — ABNORMAL LOW (ref 3.5–5.0)
Alkaline Phosphatase: 79 U/L (ref 38–126)
Anion gap: 8 (ref 5–15)
BUN: 16 mg/dL (ref 8–23)
CO2: 27 mmol/L (ref 22–32)
Calcium: 8.9 mg/dL (ref 8.9–10.3)
Chloride: 106 mmol/L (ref 98–111)
Creatinine, Ser: 0.89 mg/dL (ref 0.44–1.00)
GFR calc Af Amer: >60 mL/min (ref >60)
GFR calc non Af Amer: 53 mL/min — ABNORMAL LOW (ref >60)
Glucose, Bld: 95 mg/dL (ref 70–99)
Potassium: 3.7 mmol/L (ref 3.5–5.1)
Sodium: 141 mmol/L (ref 135–145)
Total Bilirubin: 0.7 mg/dL (ref 0.3–1.2)
Total Protein: 7 g/dL (ref 6.5–8.1)

## 2019-08-09 LAB — CBC WITH DIFFERENTIAL/PLATELET
Abs Immature Granulocytes: 0.09 10*3/uL — ABNORMAL HIGH (ref 0.00–0.07)
Basophils Absolute: 0 10*3/uL (ref 0.0–0.1)
Basophils Relative: 0 %
Eosinophils Absolute: 0 10*3/uL (ref 0.0–0.5)
Eosinophils Relative: 0 %
HCT: 33.6 % — ABNORMAL LOW (ref 36.0–46.0)
Hemoglobin: 10.7 g/dL — ABNORMAL LOW (ref 12.0–15.0)
Immature Granulocytes: 1 %
Lymphocytes Relative: 22 %
Lymphs Abs: 2.1 10*3/uL (ref 0.7–4.0)
MCH: 30.4 pg (ref 26.0–34.0)
MCHC: 31.8 g/dL (ref 30.0–36.0)
MCV: 95.5 fL (ref 80.0–100.0)
Monocytes Absolute: 1.7 10*3/uL — ABNORMAL HIGH (ref 0.1–1.0)
Monocytes Relative: 19 %
Neutro Abs: 5.4 10*3/uL (ref 1.7–7.7)
Neutrophils Relative %: 58 %
Platelets: 156 10*3/uL (ref 150–400)
RBC: 3.52 MIL/uL — ABNORMAL LOW (ref 3.87–5.11)
RDW: 14.3 % (ref 11.5–15.5)
WBC: 9.3 10*3/uL (ref 4.0–10.5)
nRBC: 0 % (ref 0.0–0.2)

## 2019-08-09 LAB — PROTIME-INR
INR: 1.1 (ref 0.8–1.2)
Prothrombin Time: 13.3 seconds (ref 11.4–15.2)

## 2019-08-09 LAB — CK: Total CK: 122 U/L (ref 38–234)

## 2019-08-09 LAB — SARS CORONAVIRUS 2 BY RT PCR (HOSPITAL ORDER, PERFORMED IN ~~LOC~~ HOSPITAL LAB): SARS Coronavirus 2: NEGATIVE

## 2019-08-09 MED ORDER — CITALOPRAM HYDROBROMIDE 20 MG PO TABS
20.0000 mg | ORAL_TABLET | Freq: Every day | ORAL | Status: DC
  Administered 2019-08-09 – 2019-08-13 (×5): 20 mg via ORAL
  Filled 2019-08-09 (×5): qty 1

## 2019-08-09 MED ORDER — PRENATAL MULTIVITAMIN CH
1.0000 | ORAL_TABLET | Freq: Every day | ORAL | Status: DC
  Administered 2019-08-09 – 2019-08-13 (×5): 1 via ORAL
  Filled 2019-08-09 (×5): qty 1

## 2019-08-09 MED ORDER — ONDANSETRON HCL 4 MG/2ML IJ SOLN: 4.0000 mg | Freq: Four times a day (QID) | INTRAMUSCULAR | Status: DC | PRN

## 2019-08-09 MED ORDER — OLANZAPINE 2.5 MG PO TABS
2.5000 mg | ORAL_TABLET | Freq: Every evening | ORAL | Status: DC | PRN
  Filled 2019-08-09 (×2): qty 1

## 2019-08-09 MED ORDER — POLYETHYLENE GLYCOL 3350 17 G PO PACK: 17.0000 g | PACK | Freq: Every day | ORAL | Status: DC | PRN

## 2019-08-09 MED ORDER — LOPERAMIDE HCL 2 MG PO CAPS: 2.0000 mg | ORAL_CAPSULE | ORAL | Status: DC | PRN

## 2019-08-09 MED ORDER — POTASSIUM CHLORIDE CRYS ER 20 MEQ PO TBCR
20.0000 meq | EXTENDED_RELEASE_TABLET | Freq: Every day | ORAL | Status: DC
  Administered 2019-08-09 – 2019-08-13 (×5): 20 meq via ORAL
  Filled 2019-08-09 (×5): qty 1

## 2019-08-09 MED ORDER — METOPROLOL SUCCINATE ER 25 MG PO TB24
25.0000 mg | ORAL_TABLET | Freq: Every day | ORAL | Status: DC
  Administered 2019-08-10 – 2019-08-13 (×4): 25 mg via ORAL
  Filled 2019-08-09 (×5): qty 1

## 2019-08-09 MED ORDER — POLYVINYL ALCOHOL 1.4 % OP SOLN
1.0000 [drp] | Freq: Two times a day (BID) | OPHTHALMIC | Status: DC
  Administered 2019-08-09 – 2019-08-13 (×8): 1 [drp] via OPHTHALMIC
  Filled 2019-08-09: qty 15

## 2019-08-09 MED ORDER — POLYVINYL ALCOHOL-POVIDONE PF 1.4-0.6 % OP SOLN: 1.0000 [drp] | Freq: Two times a day (BID) | OPHTHALMIC | Status: DC

## 2019-08-09 MED ORDER — ONDANSETRON HCL 4 MG PO TABS: 4.0000 mg | ORAL_TABLET | Freq: Four times a day (QID) | ORAL | Status: DC | PRN

## 2019-08-09 MED ORDER — ACETAMINOPHEN 325 MG PO TABS
650.0000 mg | ORAL_TABLET | Freq: Four times a day (QID) | ORAL | Status: DC | PRN
  Administered 2019-08-10: 650 mg via ORAL
  Filled 2019-08-09: qty 2

## 2019-08-09 MED ORDER — AMLODIPINE BESYLATE 5 MG PO TABS
2.5000 mg | ORAL_TABLET | Freq: Every day | ORAL | Status: DC
  Administered 2019-08-09 – 2019-08-10 (×2): 2.5 mg via ORAL
  Filled 2019-08-09 (×2): qty 1

## 2019-08-09 MED ORDER — ALBUTEROL SULFATE (2.5 MG/3ML) 0.083% IN NEBU: 2.5000 mg | INHALATION_SOLUTION | RESPIRATORY_TRACT | Status: DC | PRN

## 2019-08-09 MED ORDER — ACETAMINOPHEN 650 MG RE SUPP: 650.0000 mg | Freq: Four times a day (QID) | RECTAL | Status: DC | PRN

## 2019-08-09 MED ORDER — HYDRALAZINE HCL 20 MG/ML IJ SOLN: 10.0000 mg | Freq: Four times a day (QID) | INTRAMUSCULAR | Status: DC | PRN

## 2019-08-09 MED ORDER — PANTOPRAZOLE SODIUM 40 MG PO TBEC
40.0000 mg | DELAYED_RELEASE_TABLET | ORAL | Status: DC
  Administered 2019-08-09 – 2019-08-13 (×3): 40 mg via ORAL
  Filled 2019-08-09 (×4): qty 1

## 2019-08-09 MED ORDER — ISOSORBIDE MONONITRATE ER 30 MG PO TB24
30.0000 mg | ORAL_TABLET | Freq: Every day | ORAL | Status: DC
  Administered 2019-08-09 – 2019-08-10 (×2): 30 mg via ORAL
  Filled 2019-08-09 (×2): qty 1

## 2019-08-09 MED ORDER — FUROSEMIDE 40 MG PO TABS
20.0000 mg | ORAL_TABLET | ORAL | Status: DC
  Administered 2019-08-09 – 2019-08-13 (×4): 20 mg via ORAL
  Filled 2019-08-09 (×4): qty 1

## 2019-08-09 NOTE — ED Notes (Signed)
Transport called.

## 2019-08-09 NOTE — H&P (Signed)
History and Physical    DOA: 08/09/2019  PCP: Josetta Huddle, MD  Patient coming from: home  Chief Complaint: recent fall, headache, generalized weakness  HPI: [name] is a 86 y.o. female with history h/o CAD, CHF, CKD stage III, diverticulosis, hypertension/hyperlipidemia, pulmonary HTN, vertigo and depression who lives alone at home and has caregivers coming during the daytime but none at night, who is also a walker dependent at baseline presents today with complaints of generalized weakness and inability to function at home.  She reports left frontal headache since she fell on 6/27 at home while walking out of her bathroom using her walker and hit her head.  Patient states her legs gave away that particular day and since then she has had overall decline in function.  Her niece and nephew are at bedside and state they check on her frequently but she has been doing reasonably well with caregiver health during the daytime until this recent fall.  She denies any focal weakness, numbness, vision changes, dysphagia or dysarthria.  She does report feeling like she is floating when she is up on her feet but not all the time.  She is very hard of hearing and most of the history obtained by talking to family members who she understands better by reading lip movements. ED course: Temp 97.67F, pulse 56, respiratory rate 15, BP 139/59, O2 sat 94 to 99% on room air.  CT head revealed 12 mm subacute subdural hematoma on the left with mild midline shift to the right.  Case was discussed with neurosurgery on-call who recommended conservative management and outpatient follow-up.  Family however concern regarding her ability to live alone and patient admitted for further evaluation and management.   Review of Systems: Patient reports dyspnea on exertion which is chronic.  Never been on home O2.As per HPI other review of systems negative.    Past Medical History:  Diagnosis Date  . Allergic rhinitis     . CAD (coronary artery disease)   . CHF (congestive heart failure) ([place])   . CKD (chronic kidney disease) stage 3, GFR 30-59 ml/min 09/12/2014  . Diverticular disease   . Diverticulosis   . DJD (degenerative joint disease) of knee   . DJD (degenerative joint disease) of lumbar spine    scoliosis  . Eczema   . Esophageal erosions    from nsaid   . GI bleed   . Hormone replacement therapy (postmenopausal)   . Hyperglycemia   . Hyperlipidemia   . Hypertension   . Pneumonia    july 2009  . Pulmonary hypertension ([place])    a. 2D echo 03/2008 showed normal biventricular thickness, chamber size and systolic function, EF 81-82%, mild mitral regurgitation, aortic sclerosis, moderate pulmonary HTN, pulmonic regurgitation.  Marland Kitchen PVC's (premature ventricular contractions)   . Reactive depression (situational)   . Sciatica    right lower extremity  . Shingles    right thoracic 2008  . Vertigo     Past Surgical History:  Procedure Laterality Date  . APPENDECTOMY    . BACK SURGERY    . BREAST LUMPECTOMY    . FLEXIBLE SIGMOIDOSCOPY Left 08/13/2013   Procedure: FLEXIBLE SIGMOIDOSCOPY;  Surgeon: Arta Silence, MD;  Location: [facility];  Service: Endoscopy;  Laterality: Left;  . TOTAL ABDOMINAL HYSTERECTOMY      Social history:  reports that she has never smoked. She has never used smokeless tobacco. She reports current alcohol use. She reports that she does not use drugs.  Allergies  Allergen Reactions  . Ace Inhibitors Hives and Itching  . Streptomycin Other (See Comments) and Itching    Pruritus Pruritus   . Tramadol Other (See Comments)    unknown  . Vesicare [Solifenacin Succinate] Itching  . Penicillins Rash    Pruritic rash Has patient had a PCN reaction causing immediate rash, facial/tongue/throat swelling, SOB or lightheadedness with hypotension: unknown Has patient had a PCN reaction causing severe rash involving mucus membranes or skin necrosis: unknown Has patient  had a PCN reaction that required hospitalization:unknown Has patient had a PCN reaction occurring within the last 10 years: yes If all of the above answers are "NO", then may proceed with Cephalosporin use.     Family History  Problem Relation Age of Onset  . Heart disease Father   . Heart attack Father   . Heart disease Mother   . Heart attack Mother   . Heart disease Daughter   . Heart disease Son   . Cancer Brother   . Heart disease Sister   . Heart attack Sister        X2  . Heart attack Brother   . Hypertension Brother   . Stroke Neg Hx       Prior to Admission medications   Medication Sig Start Date End Date Taking? Authorizing Provider  acetaminophen (TYLENOL) 325 MG tablet Take 325-650 mg by mouth every 6 (six) hours.   Yes [provider]  amLODipine (NORVASC) 2.5 MG tablet Take 2.5 mg by mouth daily. 03/22/19  Yes [provider]  Cephalexin 250 MG tablet Take 250 mg by mouth 2 (two) times daily. 7 day supply 08/06/19  Yes [provider]  citalopram (CELEXA) 20 MG tablet Take 20 mg by mouth daily.  10/19/18  Yes [provider]  clotrimazole (LOTRIMIN) 1 % cream Apply 1 application topically 2 (two) times daily.    Yes [provider]  furosemide (LASIX) 40 MG tablet Take 20 mg by mouth as directed. Take 0.5 tablet by mouth Monday, Wednesday, Friday 09/10/16  Yes [provider]  isosorbide mononitrate (IMDUR) 30 MG 24 hr tablet TAKE 1 TABLET BY MOUTH ONCE DAILY 01/02/18  Yes Jettie Booze, MD  loperamide (IMODIUM) 2 MG capsule Take 1 capsule (2 mg total) by mouth as needed for diarrhea or loose stools. 08/12/16  Yes Domenic Polite, MD  meclizine (ANTIVERT) 25 MG tablet Take 25 mg by mouth 2 (two) times daily as needed for dizziness or nausea.    Yes [provider]  metoprolol succinate (TOPROL-XL) 25 MG 24 hr tablet Take 1 tablet (25 mg total) by mouth daily. 11/17/18  Yes Jettie Booze, MD   OLANZapine (ZYPREXA) 2.5 MG tablet Take 2.5 mg by mouth at bedtime as needed (mood/anxiety).  06/24/19  Yes [provider]  pantoprazole (PROTONIX) 40 MG tablet Take 40 mg by mouth as directed. Take on MWF 11/24/17  Yes [provider]  polyethylene glycol (MIRALAX) packet Take 17 g by mouth daily as needed for mild constipation. 08/21/17  Yes Jani Gravel, MD  Polyvinyl Alcohol-Povidone (REFRESH OP) Apply 1 drop to eye 2 (two) times daily.   Yes [provider]  potassium chloride SA (K-DUR,KLOR-CON) 20 MEQ tablet Take 20 mEq by mouth daily.    Yes [provider]  PREMARIN 0.3 MG tablet Take 0.3 mg daily by mouth. Patient not taking: Reported on 08/09/2019 11/13/16   [provider]  Prenatal Vit-Fe Fumarate-FA (PRENATAL PO) Take  1 tablet by mouth daily. Patient not taking: Reported on 08/09/2019    [provider]  RESTASIS 0.05 % ophthalmic emulsion Place 1 drop into both eyes 2 (two) times daily.  Patient not taking: Reported on 08/09/2019 12/22/17   [provider]    Physical Exam: Vitals:   08/09/19 1730 08/09/19 1745 08/09/19 1800 08/09/19 1815  BP: 137/61 (!) 142/61 (!) 143/58 (!) 139/59  Pulse: (!) 58 (!) 55 61 (!) 56  Resp: 19 17 19 15   Temp:      TempSrc:      SpO2: 93% 94% 92% 94%    Constitutional: Elderly, cachectic appearing female who appears overall comfortable.   Eyes: PERRL, lids and conjunctivae normal ENMT: Hard of hearing.  Mucous membranes are moist. Posterior pharynx clear of any exudate or lesions.Normal dentition.  Neck: normal, supple, no masses, no thyromegaly Respiratory: clear to auscultation bilaterally, no wheezing, no crackles. Normal respiratory effort. No accessory muscle use.  Cardiovascular: Regular rate and rhythm, no murmurs / rubs / gallops. No extremity edema.  Abdomen: no tenderness, no masses palpated. No hepatosplenomegaly. Bowel sounds positive.  Musculoskeletal: Mildly decreased  range of motion bilateral knees due to chronic arthritis.  Left knee somewhat smaller than compared to right, no redness, tenderness or warmth. Neurologic: CN 2-12 grossly intact. Sensation intact, DTR normal. Strength 4/5 in all 4.  Psychiatric: Normal judgment and insight. Alert and oriented x 3. Normal mood.  SKIN/catheters: no rashes, lesions, ulcers. No induration  Labs on Admission: I have personally reviewed following labs and imaging studies  CBC: Recent Labs  Lab 08/09/19 1239  WBC 9.3  NEUTROABS 5.4  HGB 10.7*  HCT 33.6*  MCV 95.5  PLT 564   Basic Metabolic Panel: Recent Labs  Lab 08/09/19 1239  NA 141  K 3.7  CL 106  CO2 27  GLUCOSE 95  BUN 16  CREATININE 0.89  CALCIUM 8.9   GFR: CrCl cannot be calculated (Unknown ideal weight.). Recent Labs  Lab 08/09/19 1239  WBC 9.3   Liver Function Tests: Recent Labs  Lab 08/09/19 1239  AST 20  ALT 9  ALKPHOS 79  BILITOT 0.7  PROT 7.0  ALBUMIN 3.2*   No results for input(s): LIPASE, AMYLASE in the last 168 hours. No results for input(s): AMMONIA in the last 168 hours. Coagulation Profile: Recent Labs  Lab 08/09/19 1405  INR 1.1   Cardiac Enzymes: Recent Labs  Lab 08/09/19 1405  CKTOTAL 122   BNP (last 3 results) No results for input(s): PROBNP in the last 8760 hours. HbA1C: No results for input(s): HGBA1C in the last 72 hours. CBG: No results for input(s): GLUCAP in the last 168 hours. Lipid Profile: No results for input(s): CHOL, HDL, LDLCALC, TRIG, CHOLHDL, LDLDIRECT in the last 72 hours. Thyroid Function Tests: No results for input(s): TSH, T4TOTAL, FREET4, T3FREE, THYROIDAB in the last 72 hours. Anemia Panel: No results for input(s): VITAMINB12, FOLATE, FERRITIN, TIBC, IRON, RETICCTPCT in the last 72 hours. Urine analysis:    Component Value Date/Time   COLORURINE YELLOW 08/09/2019 1405   APPEARANCEUR CLEAR 08/09/2019 1405   LABSPEC 1.018 08/09/2019 1405   PHURINE 5.0 08/09/2019 1405     GLUCOSEU NEGATIVE 08/09/2019 1405   HGBUR MODERATE (A) 08/09/2019 1405   BILIRUBINUR NEGATIVE 08/09/2019 1405   KETONESUR NEGATIVE 08/09/2019 1405   PROTEINUR NEGATIVE 08/09/2019 1405   UROBILINOGEN 0.2 09/18/2014 1600   NITRITE NEGATIVE 08/09/2019 1405   LEUKOCYTESUR MODERATE (A) 08/09/2019 1405  Radiological Exams on Admission: Personally reviewed  DG Chest 2 View  Result Date: 08/09/2019 CLINICAL DATA:  Pain following fall EXAM: CHEST - 2 VIEW COMPARISON:  March 30, 2019. FINDINGS: There is mild localized eventration along the left hemidiaphragm. There is no edema or airspace opacity. Heart size and pulmonary vascular normal. There is a hiatal hernia. No pneumothorax. No acute fracture evident. IMPRESSION: Hiatal hernia. Eventration lateral left hemidiaphragm. No edema or consolidation. Stable cardiac silhouette. No pneumothorax. Electronically Signed   By: Lowella Grip III M.D.   On: 08/09/2019 14:01   CT Head Wo Contrast  Result Date: 08/09/2019 CLINICAL DATA:  Fall 2 weeks ago.  Head injury. EXAM: CT HEAD WITHOUT CONTRAST TECHNIQUE: Contiguous axial images were obtained from the base of the skull through the vertex without intravenous contrast. COMPARISON:  CT head 01/01/2018 FINDINGS: Brain: Left frontal parietal extra-axial fluid collection measuring 12 mm. This has intermediate to low-density anteriorly and mild increased density posteriorly compatible layering blood products. This is most compatible with subacute subdural hematoma. Mild midline shift to the right 3 mm. Generalized atrophy without hydrocephalus. No acute ischemia or mass. Vascular: Negative for hyperdense vessel Skull: Negative Sinuses/Orbits: Negative sinuses. Bilateral cataract extraction. No orbital mass. Other: None IMPRESSION: 12 mm subacute subdural hematoma on the left with mild midline shift to the right These results were called by telephone at the time of interpretation on 08/09/2019 at 1:35 pm to provider  [facility] , who verbally acknowledged these results. Electronically Signed   By: Franchot Gallo M.D.   On: 08/09/2019 13:35   DG Knee Complete 4 Views Left  Result Date: 08/09/2019 CLINICAL DATA:  Pain following fall EXAM: LEFT KNEE - COMPLETE 4+ VIEW COMPARISON:  None. FINDINGS: Frontal, lateral, and bilateral oblique views were obtained. There is no fracture or dislocation. There is a large joint effusion. There is moderately severe narrowing laterally with milder narrowing medially and in the patellofemoral joint regions. There is spurring in all compartments. No erosive changes. Chondrocalcinosis is present, a finding that may be seen with osteoarthritis or calcium pyrophosphate deposition disease. IMPRESSION: No acute fracture is demonstrable. There is, however, a large joint effusion. There is moderate generalized osteoarthritic change. There is chondrocalcinosis present. Electronically Signed   By: Lowella Grip III M.D.   On: 08/09/2019 13:58   DG Knee Complete 4 Views Right  Result Date: 08/09/2019 CLINICAL DATA:  Pain following fall EXAM: RIGHT KNEE - COMPLETE 4+ VIEW COMPARISON:  September 05, 2016 FINDINGS: Frontal, lateral, and bilateral oblique views were obtained. There is no appreciable fracture or dislocation. No joint effusion. There is moderate generalized joint space narrowing. There is chondrocalcinosis. No erosive change. IMPRESSION: No fracture, dislocation, or joint effusion. There is osteoarthritic change moderate generalized joint space narrowing. There is chondrocalcinosis, a finding that may be seen with osteoarthritis or calcium pyrophosphate deposition disease. Electronically Signed   By: Lowella Grip III M.D.   On: 08/09/2019 14:00    EKG: Pending at this time.  Ordered for baseline     Assessment and Plan:   Active Problems:   SDH (subdural hematoma) ([facility])    1.  Subacute subdural hematoma: Mild midline shift reported on CT.  Patient overall mentating  well.  She does report some headache which is expected.  Will admit for observation and neuro checks overnight. Case was discussed by ED physician with neurosurgery. Not a surgical candidate given advanced age and relatively asymptomatic.  Family understands overall poor prognosis given advanced  age and multiple comorbidities.  Consult PT, palliative care and social work in a.m.  2.  Dizzy spells: Patient reports feeling like she is floating when she is upright.  Will check orthostatics and DC Norvasc if positive..  TED hose ordered.  She does have history of vertigo and uses meclizine as needed.  3.  Chronic diastolic CHF, moderate pulmonary hypertension: Patient does report dyspnea on exertion which is chronic.  Never been on home O2.  On Lasix 20 mg daily.  Chest x-ray today unremarkable.  4.  CAD: Not on any antiplatelet agents per home med list.  Will hold off on any given problem #1.  Resume other home medications.  Palliative care consult for care goals.  5.  CKD stage IIIa: Patient's baseline GFR around 50s with creatinine around 0.8.  Currently stable and without any acute decline.  6.  Bilateral knee arthritis: X-rays of the knees were obtained due to patient's recent fall and complaint of legs giving away.  X-rays do reveal severe degenerative disease and joint swelling in the left lower extremity.  Can consider low-dose steroids if needed.  Would avoid NSAIDs in this elderly patient given problem #1 and problem #5.  7.  Goals of care: Consulted palliative care and social work for further discussions on care goals as well as placement options.  DVT prophylaxis: SCDs  COVID screen: Pending  Code Status: DNR as confirmed with patient and family at bedside   .Health care proxy would be nephew, [name]  Patient/Family Communication: Discussed with patient and all questions answered to satisfaction.  Consults called: Palliative care. Admission status :Patient will be admitted under  OBSERVATION status.The patient's presenting symptoms, physical exam findings, and initial radiographic and laboratory data in the context of their medical condition is felt to place them at low risk for further clinical deterioration. Furthermore, it is anticipated that the patient will be medically stable for discharge from the hospital within 2 midnights of hospital stay.      Guilford Shi MD [facility] Pager in [place]  If 7PM-7AM, please contact night-coverage [email or web address]   08/09/2019, 7:52 PM

## 2019-08-09 NOTE — ED Notes (Signed)
Pure wick has been placed. Suction set to 45mmHg.  

## 2019-08-09 NOTE — ED Provider Notes (Signed)
[facility] DEPT Provider Note   CSN: [id number] Arrival date & time: 08/09/19  1111     History Chief Complaint  Patient presents with  . Fall    [name] is a 86 y.o. female.  Patient is a 86 y/o female who lives alone presenting to the ER for increasing weakness after a fall 2 weeks ago. Patient has a hx of CAD, CKD, HLD, HTN, CHF.  She reports that she does have home health clinic [facility] in the morning and evening times.  However she mostly lives alone.  Prior to 2 weeks ago she was ambulatory with assistance of walker.  Reports that she had a fall and hit her head and has since had a headache and bilateral knee pain.  She cannot provide details of the fall but she does deny dizziness, lightheadedness or syncope.  She also saw her primary care doctor and reports that she is taking antibiotics for urinary tract infection.  She reports that she got some injections in her knees for her pain as well.  She reports that yesterday she felt so weak and had so much pain in her bilateral knees that she could not get off the toilet for several hours.  She denies any chest pain, shortness of breath, fever, chills, nausea, vomiting, dysuria.        Past Medical History:  Diagnosis Date  . Allergic rhinitis   . CAD (coronary artery disease)   . CHF (congestive heart failure) ([place])   . CKD (chronic kidney disease) stage 3, GFR 30-59 ml/min 09/12/2014  . Diverticular disease   . Diverticulosis   . DJD (degenerative joint disease) of knee   . DJD (degenerative joint disease) of lumbar spine    scoliosis  . Eczema   . Esophageal erosions    from nsaid   . GI bleed   . Hormone replacement therapy (postmenopausal)   . Hyperglycemia   . Hyperlipidemia   . Hypertension   . Pneumonia    july 2009  . Pulmonary hypertension ([place])    a. 2D echo 03/2008 showed normal biventricular thickness, chamber size and systolic function, EF 16-01%, mild mitral  regurgitation, aortic sclerosis, moderate pulmonary HTN, pulmonic regurgitation.  Marland Kitchen PVC's (premature ventricular contractions)   . Reactive depression (situational)   . Sciatica    right lower extremity  . Shingles    right thoracic 2008  . Vertigo     Patient Active Problem List   Diagnosis Date Noted  . Acute lower UTI 01/02/2018  . Acute cystitis without hematuria   . Acute encephalopathy 01/01/2018  . DJD (degenerative joint disease) 08/09/2016  . GI bleed 08/08/2016  . Abnormal echocardiogram 06/14/2016  . PVC's (premature ventricular contractions)   . Bacteremia 09/18/2014  . Fever   . Febrile illness 09/12/2014  . Diverticulitis 09/12/2014  . CKD (chronic kidney disease) stage 3, GFR 30-59 ml/min (HCC) 09/12/2014  . HTN (hypertension) 09/12/2014  . Thrombocytopenia ([place]) 09/12/2014  . Abnormal stress test 01/13/2014  . Rectal bleed 08/10/2013  . Acute renal failure ([place]) 08/10/2013  . Essential hypertension, benign 01/08/2013  . DOE (dyspnea on exertion) 01/08/2013    Past Surgical History:  Procedure Laterality Date  . APPENDECTOMY    . BACK SURGERY    . BREAST LUMPECTOMY    . FLEXIBLE SIGMOIDOSCOPY Left 08/13/2013   Procedure: FLEXIBLE SIGMOIDOSCOPY;  Surgeon: Arta Silence, MD;  Location: [facility] ENDOSCOPY;  Service: Endoscopy;  Laterality: Left;  . TOTAL ABDOMINAL  HYSTERECTOMY       OB History   No obstetric history on file.     Family History  Problem Relation Age of Onset  . Heart disease Father   . Heart attack Father   . Heart disease Mother   . Heart attack Mother   . Heart disease Daughter   . Heart disease Son   . Cancer Brother   . Heart disease Sister   . Heart attack Sister        X2  . Heart attack Brother   . Hypertension Brother   . Stroke Neg Hx     Social History   Tobacco Use  . Smoking status: Never Smoker  . Smokeless tobacco: Never Used  Vaping Use  . Vaping Use: Never used  Substance Use Topics  . Alcohol use: Yes     Comment: occasional cocktail  . Drug use: No    Home Medications Prior to Admission medications   Medication Sig Start Date End Date Taking? Authorizing Provider  amLODipine (NORVASC) 2.5 MG tablet Take 2.5 mg by mouth daily. 03/22/19   [provider]  citalopram (CELEXA) 20 MG tablet TK 1 T PO QD FOR MOOD OR ANXIETY 10/19/18   [provider]  clotrimazole (LOTRIMIN) 1 % cream Apply 1 application topically 2 (two) times daily.     [provider]  furosemide (LASIX) 40 MG tablet Take 0.5 tablets by mouth. Take 0.5 tablet by mouth Monday, Wednesday, Friday 09/10/16   [provider]  isosorbide mononitrate (IMDUR) 30 MG 24 hr tablet TAKE 1 TABLET BY MOUTH ONCE DAILY 01/02/18   Jettie Booze, MD  loperamide (IMODIUM) 2 MG capsule Take 1 capsule (2 mg total) by mouth as needed for diarrhea or loose stools. 08/12/16   Domenic Polite, MD  meclizine (ANTIVERT) 25 MG tablet Take 25 mg by mouth 2 (two) times daily as needed for dizziness or nausea.     [provider]  metoprolol succinate (TOPROL-XL) 25 MG 24 hr tablet Take 1 tablet (25 mg total) by mouth daily. 11/17/18   Jettie Booze, MD  pantoprazole (PROTONIX) 40 MG tablet Take 40 mg by mouth daily as needed (indigestion).  11/24/17   [provider]  polyethylene glycol (MIRALAX) packet Take 17 g by mouth daily as needed for mild constipation. 08/21/17   Jani Gravel, MD  Polyvinyl Alcohol-Povidone (REFRESH OP) Apply 1 drop to eye 2 (two) times daily.    [provider]  potassium chloride SA (K-DUR,KLOR-CON) 20 MEQ tablet Take 20 mEq by mouth 2 (two) times daily.    [provider]  PREMARIN 0.3 MG tablet Take 0.3 mg daily by mouth. 11/13/16   [provider]  Prenatal Vit-Fe Fumarate-FA (PRENATAL PO) Take 1 tablet by mouth daily.    [provider]  RESTASIS 0.05 % ophthalmic emulsion Place 1 drop into both eyes 2 (two) times daily.  12/22/17    [provider]    Allergies    Ace inhibitors, Streptomycin, Tramadol, Vesicare [solifenacin succinate], and Penicillins  Review of Systems   Review of Systems  Constitutional: Positive for fatigue. Negative for appetite change and fever.  HENT: Negative.   Respiratory: Negative for cough and shortness of breath.   Cardiovascular: Negative for chest pain.  Gastrointestinal: Negative.   Genitourinary: Negative for dysuria.  Musculoskeletal: Positive for arthralgias, gait problem and joint swelling. Negative for back pain.  Skin: Negative.   Neurological: Positive for headaches. Negative for  dizziness, tremors, syncope, speech difficulty, weakness and light-headedness.    Physical Exam Updated Vital Signs BP 124/60 (BP Location: Right Arm)   Pulse 61   Temp 97.7 F (36.5 C) (Oral)   Resp 17   SpO2 95%   Physical Exam Vitals and nursing note reviewed.  Constitutional:      General: She is not in acute distress.    Appearance: Normal appearance. She is not ill-appearing, toxic-appearing or diaphoretic.  HENT:     Head: Normocephalic.     Mouth/Throat:     Mouth: Mucous membranes are moist.  Eyes:     Conjunctiva/sclera: Conjunctivae normal.  Cardiovascular:     Rate and Rhythm: Normal rate and regular rhythm.  Pulmonary:     Effort: Pulmonary effort is normal.     Breath sounds: Normal breath sounds.  Abdominal:     General: Abdomen is flat.     Palpations: Abdomen is soft.  Musculoskeletal:     Comments: Global weakness in the bilateral lower extremities.  She does have diffuse swelling of the anterior left knee with tenderness to palpation., no deformity  Skin:    General: Skin is warm and dry.     Coloration: Skin is not jaundiced.     Findings: No bruising, erythema or lesion.  Neurological:     General: No focal deficit present.     Mental Status: She is alert and oriented to person, place, and time.     Sensory: No sensory deficit.  Psychiatric:         Mood and Affect: Mood normal.     ED Results / Procedures / Treatments   Labs (all labs ordered are listed, but only abnormal results are displayed) Labs Reviewed - No data to display  EKG None  Radiology No results found.  Procedures Procedures (including critical care time)  Medications Ordered in ED Medications - No data to display  ED Course  I have reviewed the triage vital signs and the nursing notes.  Pertinent labs & imaging results that were available during my care of the patient were reviewed by me and considered in my medical decision making (see chart for details).  Clinical Course as of Aug 09 1706  Mon Jul 12, 10929  [id number] 86 year old female with a mechanical fall 2 weeks ago coming in for headache and decreased ability to ambulate.  Also history of chronic osteoarthritis.  Work-up reveals a15mm subdural hematoma, chronic , causing a mild 3 mm midline shift.  She also has osteoarthritis in her bilateral knees with an effusion in the left knee.  Joint does not appear warm or septic.  Her labs are reassuring.  Will run this patient by neurosurgery. Will likely need admission due to deconditioning. She lives alone with no nearby family.    [KM]  1506 I discussed with the patient health at home.  Her nephew is at bedside and we discussed this as well.  They both do not feel comfortable taking her home due to her deconditioning.  They feel that she needs to be admitted to the hospital and may need some placement. Will discuss with hospitalist.    [KM]  1640 Spoke with Dr. Earnest Conroy who will admit patient. Still awaiting neurosurgery consult. They have been paged x2   [KM]  1700 I spoke with neurosurgery on-call who reports that at this time there is no indication for any surgical intervention on their part.  They advise she can follow-up with them outpatient.   [  KM]    Clinical Course User Index [KM] Kristine Royal   MDM Rules/Calculators/A&P                           CRITICAL CARE Performed by: Alveria Apley   Total critical care time:35 minutes  Critical care time was exclusive of separately billable procedures and treating other patients.  Critical care was necessary to treat or prevent imminent or life-threatening deterioration.  Critical care was time spent personally by me on the following activities: development of treatment plan with patient and/or surrogate as well as nursing, discussions with consultants, evaluation of patient's response to treatment, examination of patient, obtaining history from patient or surrogate, ordering and performing treatments and interventions, ordering and review of laboratory studies, ordering and review of radiographic studies, pulse oximetry and re-evaluation of patient's condition.  Final Clinical Impression(s) / ED Diagnoses Final diagnoses:  None    Rx / DC Orders ED Discharge Orders    None       Kristine Royal 08/09/19 Col[place], MD 08/10/19 1358

## 2019-08-09 NOTE — ED Triage Notes (Signed)
Patient is from home alone where she had a fall 2 weeks ago. Since then she has had decressed mobility from weakness. On Friday she started anyibiotics for a UTI. She suffers from chronic left knee pain. At home the patient ambulates with the assistance of a walker. She is A&O x 4.   EMS vitals: 116/68 BP 64 HR 98% SPO2 on room air 123 CBG

## 2019-08-10 DIAGNOSIS — E785 Hyperlipidemia, unspecified: Secondary | ICD-10-CM | POA: Diagnosis present

## 2019-08-10 DIAGNOSIS — R64 Cachexia: Secondary | ICD-10-CM | POA: Diagnosis present

## 2019-08-10 DIAGNOSIS — Z66 Do not resuscitate: Secondary | ICD-10-CM | POA: Diagnosis present

## 2019-08-10 DIAGNOSIS — H9190 Unspecified hearing loss, unspecified ear: Secondary | ICD-10-CM | POA: Diagnosis present

## 2019-08-10 DIAGNOSIS — Y92239 Unspecified place in hospital as the place of occurrence of the external cause: Secondary | ICD-10-CM | POA: Diagnosis not present

## 2019-08-10 DIAGNOSIS — I272 Pulmonary hypertension, unspecified: Secondary | ICD-10-CM

## 2019-08-10 DIAGNOSIS — M25462 Effusion, left knee: Secondary | ICD-10-CM | POA: Diagnosis not present

## 2019-08-10 DIAGNOSIS — Z9181 History of falling: Secondary | ICD-10-CM | POA: Diagnosis not present

## 2019-08-10 DIAGNOSIS — M13862 Other specified arthritis, left knee: Secondary | ICD-10-CM | POA: Diagnosis not present

## 2019-08-10 DIAGNOSIS — I5032 Chronic diastolic (congestive) heart failure: Secondary | ICD-10-CM | POA: Diagnosis present

## 2019-08-10 DIAGNOSIS — R296 Repeated falls: Secondary | ICD-10-CM | POA: Diagnosis not present

## 2019-08-10 DIAGNOSIS — T380X5A Adverse effect of glucocorticoids and synthetic analogues, initial encounter: Secondary | ICD-10-CM | POA: Diagnosis not present

## 2019-08-10 DIAGNOSIS — S065X9A Traumatic subdural hemorrhage with loss of consciousness of unspecified duration, initial encounter: Secondary | ICD-10-CM | POA: Diagnosis present

## 2019-08-10 DIAGNOSIS — S065X0A Traumatic subdural hemorrhage without loss of consciousness, initial encounter: Secondary | ICD-10-CM | POA: Diagnosis not present

## 2019-08-10 DIAGNOSIS — R5381 Other malaise: Secondary | ICD-10-CM | POA: Diagnosis not present

## 2019-08-10 DIAGNOSIS — N390 Urinary tract infection, site not specified: Secondary | ICD-10-CM | POA: Diagnosis present

## 2019-08-10 DIAGNOSIS — M170 Bilateral primary osteoarthritis of knee: Secondary | ICD-10-CM | POA: Diagnosis present

## 2019-08-10 DIAGNOSIS — Z888 Allergy status to other drugs, medicaments and biological substances status: Secondary | ICD-10-CM | POA: Diagnosis not present

## 2019-08-10 DIAGNOSIS — M13861 Other specified arthritis, right knee: Secondary | ICD-10-CM | POA: Diagnosis not present

## 2019-08-10 DIAGNOSIS — Z79899 Other long term (current) drug therapy: Secondary | ICD-10-CM | POA: Diagnosis not present

## 2019-08-10 DIAGNOSIS — W19XXXA Unspecified fall, initial encounter: Secondary | ICD-10-CM | POA: Diagnosis present

## 2019-08-10 DIAGNOSIS — M25562 Pain in left knee: Secondary | ICD-10-CM | POA: Diagnosis not present

## 2019-08-10 DIAGNOSIS — R410 Disorientation, unspecified: Secondary | ICD-10-CM | POA: Diagnosis not present

## 2019-08-10 DIAGNOSIS — M1712 Unilateral primary osteoarthritis, left knee: Secondary | ICD-10-CM | POA: Diagnosis not present

## 2019-08-10 DIAGNOSIS — R278 Other lack of coordination: Secondary | ICD-10-CM | POA: Diagnosis not present

## 2019-08-10 DIAGNOSIS — S065X9D Traumatic subdural hemorrhage with loss of consciousness of unspecified duration, subsequent encounter: Secondary | ICD-10-CM | POA: Diagnosis not present

## 2019-08-10 DIAGNOSIS — G319 Degenerative disease of nervous system, unspecified: Secondary | ICD-10-CM | POA: Diagnosis not present

## 2019-08-10 DIAGNOSIS — I951 Orthostatic hypotension: Secondary | ICD-10-CM

## 2019-08-10 DIAGNOSIS — M2550 Pain in unspecified joint: Secondary | ICD-10-CM | POA: Diagnosis not present

## 2019-08-10 DIAGNOSIS — K5790 Diverticulosis of intestine, part unspecified, without perforation or abscess without bleeding: Secondary | ICD-10-CM | POA: Diagnosis not present

## 2019-08-10 DIAGNOSIS — F419 Anxiety disorder, unspecified: Secondary | ICD-10-CM | POA: Diagnosis present

## 2019-08-10 DIAGNOSIS — G9341 Metabolic encephalopathy: Secondary | ICD-10-CM | POA: Diagnosis present

## 2019-08-10 DIAGNOSIS — Z880 Allergy status to penicillin: Secondary | ICD-10-CM | POA: Diagnosis not present

## 2019-08-10 DIAGNOSIS — I130 Hypertensive heart and chronic kidney disease with heart failure and stage 1 through stage 4 chronic kidney disease, or unspecified chronic kidney disease: Secondary | ICD-10-CM | POA: Diagnosis present

## 2019-08-10 DIAGNOSIS — I5030 Unspecified diastolic (congestive) heart failure: Secondary | ICD-10-CM | POA: Diagnosis not present

## 2019-08-10 DIAGNOSIS — I2510 Atherosclerotic heart disease of native coronary artery without angina pectoris: Secondary | ICD-10-CM | POA: Diagnosis present

## 2019-08-10 DIAGNOSIS — R41841 Cognitive communication deficit: Secondary | ICD-10-CM | POA: Diagnosis not present

## 2019-08-10 DIAGNOSIS — M6281 Muscle weakness (generalized): Secondary | ICD-10-CM | POA: Diagnosis not present

## 2019-08-10 DIAGNOSIS — N183 Chronic kidney disease, stage 3 unspecified: Secondary | ICD-10-CM | POA: Diagnosis not present

## 2019-08-10 DIAGNOSIS — Z8249 Family history of ischemic heart disease and other diseases of the circulatory system: Secondary | ICD-10-CM | POA: Diagnosis not present

## 2019-08-10 DIAGNOSIS — G9389 Other specified disorders of brain: Secondary | ICD-10-CM | POA: Diagnosis not present

## 2019-08-10 DIAGNOSIS — N179 Acute kidney failure, unspecified: Secondary | ICD-10-CM | POA: Diagnosis present

## 2019-08-10 DIAGNOSIS — Z7401 Bed confinement status: Secondary | ICD-10-CM | POA: Diagnosis not present

## 2019-08-10 DIAGNOSIS — I10 Essential (primary) hypertension: Secondary | ICD-10-CM | POA: Diagnosis not present

## 2019-08-10 DIAGNOSIS — I6200 Nontraumatic subdural hemorrhage, unspecified: Secondary | ICD-10-CM | POA: Diagnosis not present

## 2019-08-10 DIAGNOSIS — R42 Dizziness and giddiness: Secondary | ICD-10-CM | POA: Diagnosis not present

## 2019-08-10 DIAGNOSIS — W19XXXS Unspecified fall, sequela: Secondary | ICD-10-CM | POA: Diagnosis not present

## 2019-08-10 DIAGNOSIS — F28 Other psychotic disorder not due to a substance or known physiological condition: Secondary | ICD-10-CM | POA: Diagnosis present

## 2019-08-10 DIAGNOSIS — G9340 Encephalopathy, unspecified: Secondary | ICD-10-CM | POA: Diagnosis not present

## 2019-08-10 DIAGNOSIS — R2681 Unsteadiness on feet: Secondary | ICD-10-CM | POA: Diagnosis not present

## 2019-08-10 DIAGNOSIS — Z7189 Other specified counseling: Secondary | ICD-10-CM | POA: Diagnosis not present

## 2019-08-10 DIAGNOSIS — Z90710 Acquired absence of both cervix and uterus: Secondary | ICD-10-CM | POA: Diagnosis not present

## 2019-08-10 DIAGNOSIS — N1831 Chronic kidney disease, stage 3a: Secondary | ICD-10-CM | POA: Diagnosis present

## 2019-08-10 DIAGNOSIS — F329 Major depressive disorder, single episode, unspecified: Secondary | ICD-10-CM | POA: Diagnosis not present

## 2019-08-10 DIAGNOSIS — Z20822 Contact with and (suspected) exposure to covid-19: Secondary | ICD-10-CM | POA: Diagnosis present

## 2019-08-10 LAB — CBC
HCT: 38.4 % (ref 36.0–46.0)
Hemoglobin: 12.7 g/dL (ref 12.0–15.0)
MCH: 31 pg (ref 26.0–34.0)
MCHC: 33.1 g/dL (ref 30.0–36.0)
MCV: 93.7 fL (ref 80.0–100.0)
Platelets: 163 10*3/uL (ref 150–400)
RBC: 4.1 MIL/uL (ref 3.87–5.11)
RDW: 14.1 % (ref 11.5–15.5)
WBC: 7.8 10*3/uL (ref 4.0–10.5)
nRBC: 0 % (ref 0.0–0.2)

## 2019-08-10 LAB — BASIC METABOLIC PANEL
Anion gap: 11 (ref 5–15)
BUN: 12 mg/dL (ref 8–23)
CO2: 24 mmol/L (ref 22–32)
Calcium: 8.7 mg/dL — ABNORMAL LOW (ref 8.9–10.3)
Chloride: 105 mmol/L (ref 98–111)
Creatinine, Ser: 0.69 mg/dL (ref 0.44–1.00)
GFR calc Af Amer: >60 mL/min (ref >60)
GFR calc non Af Amer: >60 mL/min (ref >60)
Glucose, Bld: 102 mg/dL — ABNORMAL HIGH (ref 70–99)
Potassium: 3.6 mmol/L (ref 3.5–5.1)
Sodium: 140 mmol/L (ref 135–145)

## 2019-08-10 MED ORDER — SODIUM CHLORIDE 0.9 % IV SOLN: INTRAVENOUS | Status: DC

## 2019-08-10 MED ORDER — METHYLPREDNISOLONE SODIUM SUCC 40 MG IJ SOLR
40.0000 mg | Freq: Once | INTRAMUSCULAR | Status: AC
  Administered 2019-08-10: 40 mg via INTRAVENOUS
  Filled 2019-08-10: qty 1

## 2019-08-10 MED ORDER — LORAZEPAM 2 MG/ML IJ SOLN
0.5000 mg | Freq: Once | INTRAMUSCULAR | Status: AC
  Administered 2019-08-10: 0.5 mg via INTRAVENOUS
  Filled 2019-08-10: qty 1

## 2019-08-10 MED ORDER — BUTALBITAL-APAP-CAFFEINE 50-325-40 MG PO TABS: 1.0000 | ORAL_TABLET | Freq: Four times a day (QID) | ORAL | Status: DC | PRN

## 2019-08-10 MED ORDER — ISOSORBIDE MONONITRATE ER 30 MG PO TB24: 15.0000 mg | ORAL_TABLET | Freq: Every day | ORAL | Status: DC

## 2019-08-10 NOTE — Evaluation (Signed)
Occupational Therapy Evaluation Patient Details Name: [name] MRN: [id number] DOB: [birth year] Today's Date: 08/10/2019    History of Present Illness 86 y.o. female with history h/o CAD, CHF, CKD stage III, diverticulosis, hypertension/hyperlipidemia, pulmonary HTN, vertigo and depression that fell on 6/27 at home while walking out of her bathroom. Since fall patient reporting L frontal headaches, CT + for 12 mm subdural hematoma with neuro recommending conservative management. Patient also with L knee pain + for L effusion.   Clinical Impression   Patient with functional deficits listed below impacting safety and independence with self care. Patient having difficulty with word finding, organizing her thoughts and short term memory deficits. Patient require mod cues for safety to keep LEs inside frame of walker with ambulation and min A with toilet transfer to power up to standing. Patient will require 24/7 supervision/assist at discharge, if family can arrange caregiver assist 24/7 recommend [facility], otherwise SNF at this time.    Follow Up Recommendations  SNF;Home health OT;Supervision/Assistance - 24 hour;Other (comment) (HH if can provide 24/7 supervision/A at home)    Equipment Recommendations  None recommended by OT       Precautions / Restrictions Precautions Precautions: Fall Precaution Comments: HOH Restrictions Weight Bearing Restrictions: No      Mobility Bed Mobility Overal bed mobility: Needs Assistance Bed Mobility: Supine to Sit     Supine to sit: Supervision        Transfers Overall transfer level: Needs assistance Equipment used: Rolling walker (2 wheeled) Transfers: Sit to/from Stand Sit to Stand: Min assist         General transfer comment: min A to power up to standing from toilet, min guard for eccentric control with sitting    Balance Overall balance assessment: Needs assistance Sitting-balance support: Bilateral upper extremity  supported;Feet supported Sitting balance-Leahy Scale: Good     Standing balance support: Bilateral upper extremity supported;During functional activity Standing balance-Leahy Scale: Poor Standing balance comment: reliant on external support                           ADL either performed or assessed with clinical judgement   ADL Overall ADL's : Needs assistance/impaired     Grooming: Min guard;Wash/dry hands;Standing                   Armed forces technical officer: Minimal assistance;Ambulation;Comfort height toilet;Grab bars;RW Armed forces technical officer Details (indicate cue type and reason): min A to power up to standing from commode over toilet, min G for eccentric control onto toilet Toileting- Clothing Manipulation and Hygiene: Min guard;Sit to/from stand Toileting - Clothing Manipulation Details (indicate cue type and reason): for standing balance managing night gown and brief     Functional mobility during ADLs: Minimal assistance;Cueing for safety;Rolling walker General ADL Comments: patient requiring min A for functional ambulation and transfers with cues to keep feet inside frame of walker                  Pertinent Vitals/Pain Pain Assessment: Faces Faces Pain Scale: Hurts little more Pain Location: L knee Pain Descriptors / Indicators: Aching;Sore Pain Intervention(s): Monitored during session     Hand Dominance Right   Extremity/Trunk Assessment Upper Extremity Assessment Upper Extremity Assessment: Generalized weakness   Lower Extremity Assessment Lower Extremity Assessment: Defer to PT evaluation       Communication Communication Communication: HOH   Cognition Arousal/Alertness: Awake/alert Behavior During Therapy: WFL for tasks assessed/performed Overall Cognitive Status:  Impaired/Different from baseline Area of Impairment: Memory;Problem solving                     Memory: Decreased short-term memory       Problem Solving: Slow  processing;Requires verbal cues General Comments: cannot recall name of hospital with approx 5 min time lapse, difficulty with word finding, organizing thoughts. notified MD would benefit from speech consult. patient states this is not her baseline              Home Living Family/patient expects to be discharged to:: Private residence Living Arrangements: Alone Available Help at Discharge: Family;Personal care attendant;Available PRN/intermittently Type of Home: House Home Access: Level entry (in back)     Home Layout: One level     Bathroom Shower/Tub: Occupational psychologist: Handicapped height Bathroom Accessibility: Yes How Accessible: Accessible via walker Home Equipment: [place] - 4 wheels;Shower seat;Grab bars - tub/shower          Prior Functioning/Environment Level of Independence: Needs assistance  Gait / Transfers Assistance Needed: uses rollator for ambulation ADL's / Homemaking Assistance Needed: patient has caregiver fro 9am-12pm assist with dressing, bathing, cooking then another caregiver arrives 5pm-8pm to assist with ADL/IADLs as needed. patient is alone from noon to 5pm and at night            OT Problem List: Decreased strength;Decreased activity tolerance;Impaired balance (sitting and/or standing);Decreased safety awareness;Pain      OT Treatment/Interventions: Self-care/ADL training;Energy conservation;DME and/or AE instruction;Therapeutic activities;Balance training;Patient/family education;Cognitive remediation/compensation    OT Goals(Current goals can be found in the care plan section) Acute Rehab OT Goals Patient Stated Goal: "Can I use the bathroom?" OT Goal Formulation: With patient Time For Goal Achievement: 08/24/19 Potential to Achieve Goals: Good  OT Frequency: Min 2X/week   Barriers to D/C: Decreased caregiver support  currently patient does not have 24/7 caregiver support          AM-PAC OT "6 Clicks" Daily Activity      Outcome Measure Help from another person eating meals?: None Help from another person taking care of personal grooming?: A Little Help from another person toileting, which includes using toliet, bedpan, or urinal?: A Little Help from another person bathing (including washing, rinsing, drying)?: A Little Help from another person to put on and taking off regular upper body clothing?: A Little Help from another person to put on and taking off regular lower body clothing?: A Little 6 Click Score: 19   End of Session Equipment Utilized During Treatment: Rolling walker Nurse Communication: Mobility status  Activity Tolerance: Patient tolerated treatment well Patient left: in chair;with call bell/phone within reach;with chair alarm set  OT Visit Diagnosis: History of falling (Z91.81);Muscle weakness (generalized) (M62.81);Pain Pain - Right/Left: Left Pain - part of body: Knee                Time: 1341-1416 OT Time Calculation (min): 35 min Charges:  OT General Charges $OT Visit: 1 Visit OT Evaluation $OT Eval Moderate Complexity: 1 Mod OT Treatments $Self Care/Home Management : 8-22 mins  Delbert Phenix OT Pager: [phone]  Rosemary Holms 08/10/2019, 3:26 PM

## 2019-08-10 NOTE — Evaluation (Signed)
Physical Therapy Evaluation Patient Details Name: [name] MRN: [id number] DOB: 06/07/1916 Today's Date: 08/10/2019   History of Present Illness  86 y.o. female with history h/o CAD, CHF, CKD stage III, diverticulosis, hypertension/hyperlipidemia, pulmonary HTN, vertigo and depression that fell on 6/27 at home while walking out of her bathroom. Since fall patient reporting L frontal headaches, CT + for 12 mm subdural hematoma with neuro recommending conservative management. Patient also with L knee pain + for L effusion.  Clinical Impression  On eval, pt required Min assist for mobility. She walked ~50 feet with a rollator. Pt presents with general weakness, decreased activity tolerance, and impaired gait and balance. Pt has some at home assistance but based on today's performance, she would likely require 24/7 supervision/assist. Spoke with pt about rehab-she is agreeable. Recommend ST SNF. Will continue to follow and progress activity as tolerated.     Follow Up Recommendations SNF    Equipment Recommendations  None recommended by PT    Recommendations for Other Services       Precautions / Restrictions Precautions Precautions: Fall Precaution Comments: very HOH-even with hearing aids Restrictions Weight Bearing Restrictions: No      Mobility  Bed Mobility Overal bed mobility: Needs Assistance Bed Mobility: Supine to Sit          General bed mobility comments: oob in bathroom  Transfers Overall transfer level: Needs assistance Equipment used: 4-wheeled walker Transfers: Sit to/from Stand Sit to Stand: Min assist         General transfer comment: Assist to power up, stabilize, control descent. Cues for safety, technique, hand placement.  Ambulation/Gait Ambulation/Gait assistance: Min assist Gait Distance (Feet): 50 Feet Assistive device: 4-wheeled walker Gait Pattern/deviations: Step-through pattern;Decreased stride length     General Gait Details:  Assist to stabilize/steady pt throughout distance. 2 brief standing rest breaks 2* fatigue. Dyspnea 2/4. O2 99% on RA.  Stairs            Wheelchair Mobility    Modified Rankin (Stroke Patients Only)       Balance Overall balance assessment: Needs assistance Sitting-balance support: Bilateral upper extremity supported;Feet supported Sitting balance-Leahy Scale: Good     Standing balance support: Bilateral upper extremity supported Standing balance-Leahy Scale: Poor Standing balance comment: reliant on external support                             Pertinent Vitals/Pain Pain Assessment: Faces Faces Pain Scale: Hurts little more Pain Location: L knee Pain Descriptors / Indicators: Aching;Sore Pain Intervention(s): Limited activity within patient's tolerance;Monitored during session;Repositioned    Home Living Family/patient expects to be discharged to:: Private residence Living Arrangements: Alone Available Help at Discharge: Family;Personal care attendant;Available PRN/intermittently Type of Home: House Home Access: Level entry (in back)     Home Layout: One level Home Equipment: Walker - 4 wheels;Shower seat;Grab bars - tub/shower      Prior Function Level of Independence: Needs assistance   Gait / Transfers Assistance Needed: uses rollator for ambulation  ADL's / Homemaking Assistance Needed: patient has caregiver fro 9am-12pm assist with dressing, bathing, cooking then another caregiver arrives 5pm-8pm to assist with ADL/IADLs as needed. patient is alone from noon to 5pm and at night        Hand Dominance   Dominant Hand: Right    Extremity/Trunk Assessment   Upper Extremity Assessment Upper Extremity Assessment: Defer to OT evaluation    Lower Extremity Assessment  Lower Extremity Assessment: Generalized weakness    Cervical / Trunk Assessment Cervical / Trunk Assessment: Kyphotic  Communication   Communication: HOH  Cognition  Arousal/Alertness: Awake/alert Behavior During Therapy: WFL for tasks assessed/performed Overall Cognitive Status: Impaired/Different from baseline Area of Impairment: Memory;Problem solving                     Memory: Decreased short-term memory       Problem Solving: Slow processing;Requires verbal cues General Comments: cannot recall name of hospital with approx 5 min time lapse, difficulty with word finding, organizing thoughts. notified MD would benefit from speech consult. patient states this is not her baseline      General Comments      Exercises     Assessment/Plan    PT Assessment Patient needs continued PT services  PT Problem List Decreased strength;Decreased mobility;Decreased activity tolerance;Decreased balance;Decreased knowledge of use of DME       PT Treatment Interventions DME instruction;Gait training;Therapeutic activities;Therapeutic exercise;Patient/family education;Balance training;Functional mobility training    PT Goals (Current goals can be found in the Care Plan section)  Acute Rehab PT Goals Patient Stated Goal: "im gonna make it" PT Goal Formulation: With patient/family Time For Goal Achievement: 08/24/19    Frequency Min 3X/week   Barriers to discharge Decreased caregiver support      Co-evaluation               AM-PAC PT "6 Clicks" Mobility  Outcome Measure Help needed turning from your back to your side while in a flat bed without using bedrails?: A Little Help needed moving from lying on your back to sitting on the side of a flat bed without using bedrails?: A Little Help needed moving to and from a bed to a chair (including a wheelchair)?: A Little Help needed standing up from a chair using your arms (e.g., wheelchair or bedside chair)?: A Little Help needed to walk in hospital room?: A Little Help needed climbing 3-5 steps with a railing? : A Lot 6 Click Score: 17    End of Session Equipment Utilized During  Treatment: Gait belt Activity Tolerance: Patient limited by fatigue Patient left: in chair;with call bell/phone within reach;with chair alarm set   PT Visit Diagnosis: Muscle weakness (generalized) (M62.81);Unsteadiness on feet (R26.81)    Time: 7858-8502 PT Time Calculation (min) (ACUTE ONLY): 24 min   Charges:   PT Evaluation $PT Eval Low Complexity: 1 Low PT Treatments $Gait Training: 8-22 mins          Doreatha Massed, PT Acute Rehabilitation  Office: [phone] Pager: [phone]

## 2019-08-10 NOTE — Progress Notes (Signed)
PROGRESS NOTE    [name]  DDU:[id number]  DOB: [birth year]  PCP: Josetta Huddle, MD Admit date:08/09/2019 Chief compliant:  recent fall, headache, generalized weakness 86 y.o. female with history h/o CAD, CHF, CKD stage III, diverticulosis, hypertension/hyperlipidemia, pulmonary HTN, vertigo and depression who lives alone at home and has caregivers coming during the daytime but none at night, who is also a walker dependent at baseline presents today with complaints of generalized weakness and inability to function at home.  She reports left frontal headache since she fell on 6/27 at home while walking out of her bathroom using her walker and hit her head.  Patient states her legs gave away that particular day and since then she has had overall decline in function.  ED Course: Afebrile.  CT head revealed 12 mm subacute subdural hematoma on the left with mild midline shift to the right.  Case was discussed with neurosurgery on-call who recommended conservative management and outpatient follow-up.  Family however concern regarding her ability to ambulate, perform ADLs, lives alone and patient admitted for further evaluation and management. Hospital course: Patient has reported headache and also noted to have left knee swelling/pain when she bears weight.  She is also noted to be orthostatic limiting her ambulation.  Subjective:  Patient reports left frontal headache, 7/10 but has not requested any pain medication for as needed use.  Also reports feeling weak and lightheaded on walking.  Objective: Vitals:   08/09/19 2200 08/10/19 0626 08/10/19 0847 08/10/19 1411  BP: (!) 136/97 120/66 116/62 (!) 108/56  Pulse: 72 83 67 67  Resp: 16 16  17   Temp: 97.8 F (36.6 C) 97.7 F (36.5 C)  98.1 F (36.7 C)  TempSrc: Oral Oral  Oral  SpO2: 97% 96%      Intake/Output Summary (Last 24 hours) at 08/10/2019 1545 Last data filed at 08/10/2019 1354 Gross per 24 hour  Intake 190.87 ml  Output  350 ml  Net -159.13 ml   There were no vitals filed for this visit.  Physical Examination: General: thin built elderly female,  no acute distress noted Head ENT: hard of hearing,  normocephalic, PERRLA, neck supple Heart: S1-S2 heard, regular rate and rhythm, no murmurs.   No pedal edema. Lungs: Equal air entry bilaterally, no rhonchi or rales on exam, no accessory muscle use Abdomen: Bowel sounds heard, soft, nontender, nondistended. No organomegaly.  No CVA tenderness Extremities: Left knee swelling more prominent today, tender and warm to touch with decreased range of motion, lower extremity with prominent veins, no significant edema.  No cyanosis or clubbing. Neurological: Awake alert oriented x3, no focal weakness or numbness, strength and sensations to crude touch intact.  Hard of hearing Skin: No wounds or rashes.   Data Reviewed: I have personally reviewed following labs and imaging studies  CBC: Recent Labs  Lab 08/09/19 1239 08/10/19 0335  WBC 9.3 7.8  NEUTROABS 5.4  --   HGB 10.7* 12.7  HCT 33.6* 38.4  MCV 95.5 93.7  PLT 156 237   Basic Metabolic Panel: Recent Labs  Lab 08/09/19 1239 08/10/19 0335  NA 141 140  K 3.7 3.6  CL 106 105  CO2 27 24  GLUCOSE 95 102*  BUN 16 12  CREATININE 0.89 0.69  CALCIUM 8.9 8.7*   GFR: CrCl cannot be calculated (Unknown ideal weight.). Liver Function Tests: Recent Labs  Lab 08/09/19 1239  AST 20  ALT 9  ALKPHOS 79  BILITOT 0.7  PROT 7.0  ALBUMIN  3.2*   No results for input(s): LIPASE, AMYLASE in the last 168 hours. No results for input(s): AMMONIA in the last 168 hours. Coagulation Profile: Recent Labs  Lab 08/09/19 1405  INR 1.1   Cardiac Enzymes: Recent Labs  Lab 08/09/19 1405  CKTOTAL 122   BNP (last 3 results) No results for input(s): PROBNP in the last 8760 hours. HbA1C: No results for input(s): HGBA1C in the last 72 hours. CBG: No results for input(s): GLUCAP in the last 168 hours. Lipid  Profile: No results for input(s): CHOL, HDL, LDLCALC, TRIG, CHOLHDL, LDLDIRECT in the last 72 hours. Thyroid Function Tests: No results for input(s): TSH, T4TOTAL, FREET4, T3FREE, THYROIDAB in the last 72 hours. Anemia Panel: No results for input(s): VITAMINB12, FOLATE, FERRITIN, TIBC, IRON, RETICCTPCT in the last 72 hours. Sepsis Labs: No results for input(s): PROCALCITON, LATICACIDVEN in the last 168 hours.  Recent Results (from the past 240 hour(s))  SARS Coronavirus 2 by RT PCR (hospital order, performed in [facility] hospital lab) Nasopharyngeal Nasopharyngeal Swab     Status: None   Collection Time: 08/09/19  4:00 PM   Specimen: Nasopharyngeal Swab  Result Value Ref Range Status   SARS Coronavirus 2 NEGATIVE NEGATIVE Final    Comment: (NOTE) SARS-CoV-2 target nucleic acids are NOT DETECTED.  The SARS-CoV-2 RNA is generally detectable in upper and lower respiratory specimens during the acute phase of infection. The lowest concentration of SARS-CoV-2 viral copies this assay can detect is 250 copies / mL. A negative result does not preclude SARS-CoV-2 infection and should not be used as the sole basis for treatment or other patient management decisions.  A negative result may occur with improper specimen collection / handling, submission of specimen other than nasopharyngeal swab, presence of viral mutation(s) within the areas targeted by this assay, and inadequate number of viral copies (<250 copies / mL). A negative result must be combined with clinical observations, patient history, and epidemiological information.  Fact Sheet for Patients:   StrictlyIdeas.no  Fact Sheet for Healthcare Providers: BankingDealers.co.za  This test is not yet approved or  cleared by the [place] FDA and has been authorized for detection and/or diagnosis of SARS-CoV-2 by FDA under an Emergency Use Authorization (EUA).  This EUA will remain in  effect (meaning this test can be used) for the duration of the COVID-19 declaration under Section 564(b)(1) of the Act, 21 U.S.C. section 360bbb-3(b)(1), unless the authorization is terminated or revoked sooner.  Performed at [facility], [place] [address] [place], [place] [address]       Radiology Studies: DG Chest 2 View  Result Date: 08/09/2019 CLINICAL DATA:  Pain following fall EXAM: CHEST - 2 VIEW COMPARISON:  March 30, 2019. FINDINGS: There is mild localized eventration along the left hemidiaphragm. There is no edema or airspace opacity. Heart size and pulmonary vascular normal. There is a hiatal hernia. No pneumothorax. No acute fracture evident. IMPRESSION: Hiatal hernia. Eventration lateral left hemidiaphragm. No edema or consolidation. Stable cardiac silhouette. No pneumothorax. Electronically Signed   By: Lowella Grip III M.D.   On: 08/09/2019 14:01   CT Head Wo Contrast  Result Date: 08/09/2019 CLINICAL DATA:  Fall 2 weeks ago.  Head injury. EXAM: CT HEAD WITHOUT CONTRAST TECHNIQUE: Contiguous axial images were obtained from the base of the skull through the vertex without intravenous contrast. COMPARISON:  CT head 01/01/2018 FINDINGS: Brain: Left frontal parietal extra-axial fluid collection measuring 12 mm. This has intermediate to low-density anteriorly and mild  increased density posteriorly compatible layering blood products. This is most compatible with subacute subdural hematoma. Mild midline shift to the right 3 mm. Generalized atrophy without hydrocephalus. No acute ischemia or mass. Vascular: Negative for hyperdense vessel Skull: Negative Sinuses/Orbits: Negative sinuses. Bilateral cataract extraction. No orbital mass. Other: None IMPRESSION: 12 mm subacute subdural hematoma on the left with mild midline shift to the right These results were called by telephone at the time of interpretation on 08/09/2019 at 1:35 pm to provider [facility] , who verbally  acknowledged these results. Electronically Signed   By: Franchot Gallo M.D.   On: 08/09/2019 13:35   DG Knee Complete 4 Views Left  Result Date: 08/09/2019 CLINICAL DATA:  Pain following fall EXAM: LEFT KNEE - COMPLETE 4+ VIEW COMPARISON:  None. FINDINGS: Frontal, lateral, and bilateral oblique views were obtained. There is no fracture or dislocation. There is a large joint effusion. There is moderately severe narrowing laterally with milder narrowing medially and in the patellofemoral joint regions. There is spurring in all compartments. No erosive changes. Chondrocalcinosis is present, a finding that may be seen with osteoarthritis or calcium pyrophosphate deposition disease. IMPRESSION: No acute fracture is demonstrable. There is, however, a large joint effusion. There is moderate generalized osteoarthritic change. There is chondrocalcinosis present. Electronically Signed   By: Lowella Grip III M.D.   On: 08/09/2019 13:58   DG Knee Complete 4 Views Right  Result Date: 08/09/2019 CLINICAL DATA:  Pain following fall EXAM: RIGHT KNEE - COMPLETE 4+ VIEW COMPARISON:  September 05, 2016 FINDINGS: Frontal, lateral, and bilateral oblique views were obtained. There is no appreciable fracture or dislocation. No joint effusion. There is moderate generalized joint space narrowing. There is chondrocalcinosis. No erosive change. IMPRESSION: No fracture, dislocation, or joint effusion. There is osteoarthritic change moderate generalized joint space narrowing. There is chondrocalcinosis, a finding that may be seen with osteoarthritis or calcium pyrophosphate deposition disease. Electronically Signed   By: Lowella Grip III M.D.   On: 08/09/2019 14:00      Scheduled Meds: . amLODipine  2.5 mg Oral Daily  . citalopram  20 mg Oral Daily  . furosemide  20 mg Oral Q M,W,F  . isosorbide mononitrate  30 mg Oral Daily  . metoprolol succinate  25 mg Oral Daily  . pantoprazole  40 mg Oral Q M,W,F  . polyvinyl  alcohol  1 drop Both Eyes BID  . potassium chloride SA  20 mEq Oral Daily  . prenatal multivitamin  1 tablet Oral Daily   Continuous Infusions: . sodium chloride 75 mL/hr at 08/10/19 1345     Assessment/Plan:  1.  Subacute subdural hematoma: Mild midline shift reported on CT.  Patient overall mentating well.  She does report some headache which is expected.  Case was discussed by ED physician with neurosurgery. Not a surgical candidate given advanced age and relatively asymptomatic.  Family understands overall poor prognosis given advanced age and multiple comorbidities.  Consulted PT/OT who recommended home with 24-hour supervision versus SNF, awaiting palliative care and social work evaluation.  2.  Dizzy spells: Patient reports feeling like she is floating when she is upright.    Static check today does show significant hypotension on standing to systolic 42H.  DC Norvasc and hold nitrates which are both venodilator's and probably contributing to venous stasis when she stands up. TED hose ordered.  Will give IV hydration overnight.  Repeat orthostatic blood pressure checks in a.m. She does have history of vertigo and uses  meclizine as needed.  3.  Chronic diastolic CHF, moderate pulmonary hypertension: Patient does report dyspnea on exertion which is chronic.  Never been on home O2.  On Lasix 20 mg daily.  Chest x-ray in this admission unremarkable.  Requested O2 desat studies on PT eval  4.  CAD: Not on any antiplatelet agents per home med list.  Will hold off on any given problem #1.  Resume other home medications.  Palliative care consulted for care goals.  5.  CKD stage IIIa: Patient's baseline GFR around 50s with creatinine around 0.8.  Currently stable and without any acute decline.  6.  Bilateral knee arthritis: X-rays of the knees were obtained due to patient's recent fall and complaint of legs giving away.  X-rays do reveal severe degenerative disease and joint swelling in the  left lower extremity. Would avoid NSAIDs in this elderly patient given problem #1 and problem #5.  Given significant swelling, tenderness and decreased range of motion along the left knee joint, will try 1 dose of steroids.  If improves will give a short course given contraindication to NSAID use.  Patient apparently had intra-articular joint injection on the right side 2 weeks back by Dr. Inda Merlin.  7.  Goals of care: Consulted palliative care and social work for further discussions on care goals as well as placement options.  PT/OT evaluation in progress  DVT prophylaxis: SCDs Code Status: DNR Family / Patient Communication: Discussed with patient, nephew and niece at bedside Disposition Plan:   Status is: Inpatient (patient has medical necessity for greater than 2 midnight inpatient stay given symptomatic orthostatic hypotension and need for IV hydration, inability to ambulate with severe left knee osteoarthritis/swelling requiring acute interventions)  Remains inpatient appropriate because:Hemodynamically unstable   Dispo: The patient is from: Home              Anticipated d/c is to: SNF              Anticipated d/c date is: 2 days              Patient currently is not medically stable to d/c.            Time spent: 25 minutes     >50% time spent in discussions with care team and coordination of care.    Guilford Shi, MD [facility] Pager in [place]  If 7PM-7AM, please contact night-coverage [email or web address] 08/10/2019, 3:45 PM

## 2019-08-10 NOTE — Plan of Care (Signed)
  Problem: Education: Goal: Knowledge of General Education information will improve Description: Including pain rating scale, medication(s)/side effects and non-pharmacologic comfort measures Outcome: Progressing   Problem: Coping: Goal: Level of anxiety will decrease Outcome: Progressing   Problem: Nutrition: Goal: Adequate nutrition will be maintained Outcome: Progressing   Problem: Pain Managment: Goal: General experience of comfort will improve Outcome: Progressing

## 2019-08-11 ENCOUNTER — Inpatient Hospital Stay (HOSPITAL_COMMUNITY): Payer: Medicare Other

## 2019-08-11 DIAGNOSIS — I951 Orthostatic hypotension: Secondary | ICD-10-CM | POA: Diagnosis not present

## 2019-08-11 DIAGNOSIS — Z66 Do not resuscitate: Secondary | ICD-10-CM | POA: Insufficient documentation

## 2019-08-11 DIAGNOSIS — R410 Disorientation, unspecified: Secondary | ICD-10-CM | POA: Diagnosis not present

## 2019-08-11 DIAGNOSIS — S065X9A Traumatic subdural hemorrhage with loss of consciousness of unspecified duration, initial encounter: Secondary | ICD-10-CM | POA: Diagnosis not present

## 2019-08-11 DIAGNOSIS — M1712 Unilateral primary osteoarthritis, left knee: Secondary | ICD-10-CM

## 2019-08-11 DIAGNOSIS — Z7189 Other specified counseling: Secondary | ICD-10-CM | POA: Diagnosis not present

## 2019-08-11 LAB — URINE CULTURE

## 2019-08-11 MED ORDER — HYDROXYZINE HCL 10 MG PO TABS
10.0000 mg | ORAL_TABLET | Freq: Three times a day (TID) | ORAL | Status: DC | PRN
  Administered 2019-08-11: 10 mg via ORAL
  Filled 2019-08-11 (×3): qty 1

## 2019-08-11 MED ORDER — OLANZAPINE 2.5 MG PO TABS
2.5000 mg | ORAL_TABLET | Freq: Two times a day (BID) | ORAL | Status: DC | PRN
  Administered 2019-08-11: 2.5 mg via ORAL
  Filled 2019-08-11 (×2): qty 1

## 2019-08-11 MED ORDER — DICLOFENAC SODIUM 1 % EX GEL
2.0000 g | Freq: Four times a day (QID) | CUTANEOUS | Status: DC
  Administered 2019-08-11 – 2019-08-13 (×8): 2 g via TOPICAL
  Filled 2019-08-11: qty 100

## 2019-08-11 NOTE — NC FL2 (Addendum)
[place] MEDICAID FL2 LEVEL OF CARE SCREENING TOOL     IDENTIFICATION  Patient Name: [name] Birthdate: [birth year] Sex: female Admission Date (Current Location): 08/09/2019  [facility] and [place] Number:  Herbalist and Address:  [facility],  [address], [place]      Provider Number: [id number]  Attending Physician Name and Address:  Guilford Shi, MD  Relative Name and Phone Number:     Mickel Baas [phone]       Current Level of Care: Hospital Recommended Level of Care: [place] Prior Approval Number:    Date Approved/Denied:   PASRR Number: [id number]  Discharge Plan: SNF    Current Diagnoses: Patient Active Problem List   Diagnosis Date Noted  . DNR (do not resuscitate) 08/11/2019  . SDH (subdural hematoma) ([place]) 08/09/2019  . Acute lower UTI 01/02/2018  . Acute cystitis without hematuria   . Acute encephalopathy 01/01/2018  . DJD (degenerative joint disease) 08/09/2016  . GI bleed 08/08/2016  . Abnormal echocardiogram 06/14/2016  . PVC's (premature ventricular contractions)   . Bacteremia 09/18/2014  . Fever   . Febrile illness 09/12/2014  . Diverticulitis 09/12/2014  . CKD (chronic kidney disease) stage 3, GFR 30-59 ml/min (HCC) 09/12/2014  . HTN (hypertension) 09/12/2014  . Thrombocytopenia ([place]) 09/12/2014  . Abnormal stress test 01/13/2014  . Rectal bleed 08/10/2013  . Acute renal failure ([place]) 08/10/2013  . Essential hypertension, benign 01/08/2013  . DOE (dyspnea on exertion) 01/08/2013    Orientation RESPIRATION BLADDER Height & Weight     Self  Normal Continent Weight:   Height:     BEHAVIORAL SYMPTOMS/MOOD NEUROLOGICAL BOWEL NUTRITION STATUS      Continent Diet (Heart Healthy)  AMBULATORY STATUS COMMUNICATION OF NEEDS Skin     Verbally Normal                       Personal Care Assistance Level of Assistance  Bathing, Feeding,  Dressing Bathing Assistance: Maximum assistance Feeding assistance: Independent Dressing Assistance: Limited assistance     Functional Limitations Info  Sight, Hearing, Speech Sight Info: Impaired Hearing Info: Impaired Speech Info: Adequate    SPECIAL CARE FACTORS FREQUENCY  PT (By licensed PT), OT (By licensed OT)     PT Frequency: 5x/week OT Frequency: 5x/week            Contractures Contractures Info: Not present    Additional Factors Info  Code Status, Allergies, Psychotropic Code Status Info: DNR Allergies Info: Allergies: Ace Inhibitors, Streptomycin, Tramadol, Vesicare Solifenacin Succinate, Penicillins Psychotropic Info: Celexa         Current Medications (08/11/2019):  This is the current hospital active medication list Current Facility-Administered Medications  Medication Dose Route Frequency Provider Last Rate Last Admin  . 0.9 %  sodium chloride infusion   Intravenous Continuous Guilford Shi, MD 75 mL/hr at 08/11/19 0600 Rate Verify at 08/11/19 0600  . acetaminophen (TYLENOL) tablet 650 mg  650 mg Oral Q6H PRN Guilford Shi, MD   650 mg at 08/10/19 1006   Or  . acetaminophen (TYLENOL) suppository 650 mg  650 mg Rectal Q6H PRN Kamineni, Neelima, MD      . albuterol (PROVENTIL) (2.5 MG/3ML) 0.083% nebulizer solution 2.5 mg  2.5 mg Nebulization Q2H PRN Guilford Shi, MD      . butalbital-acetaminophen-caffeine (FIORICET) 50-325-40 MG per tablet 1 tablet  1 tablet Oral Q6H PRN Guilford Shi, MD      .  citalopram (CELEXA) tablet 20 mg  20 mg Oral Daily Guilford Shi, MD   20 mg at 08/11/19 1039  . diclofenac Sodium (VOLTAREN) 1 % topical gel 2 g  2 g Topical QID Kamineni, Neelima, MD      . furosemide (LASIX) tablet 20 mg  20 mg Oral Q M,W,F Guilford Shi, MD   20 mg at 08/11/19 1038  . hydrALAZINE (APRESOLINE) injection 10 mg  10 mg Intravenous Q6H PRN Guilford Shi, MD      . hydrOXYzine (ATARAX/VISTARIL) tablet 10 mg  10 mg Oral TID  PRN Guilford Shi, MD      . loperamide (IMODIUM) capsule 2 mg  2 mg Oral PRN Guilford Shi, MD      . metoprolol succinate (TOPROL-XL) 24 hr tablet 25 mg  25 mg Oral Daily Guilford Shi, MD   25 mg at 08/11/19 1038  . OLANZapine (ZYPREXA) tablet 2.5 mg  2.5 mg Oral BID PRN Guilford Shi, MD      . ondansetron (ZOFRAN) tablet 4 mg  4 mg Oral Q6H PRN Guilford Shi, MD       Or  . ondansetron (ZOFRAN) injection 4 mg  4 mg Intravenous Q6H PRN Kamineni, Neelima, MD      . pantoprazole (PROTONIX) EC tablet 40 mg  40 mg Oral Q M,W,F Guilford Shi, MD   40 mg at 08/11/19 1038  . polyethylene glycol (MIRALAX / GLYCOLAX) packet 17 g  17 g Oral Daily PRN Guilford Shi, MD      . polyvinyl alcohol (LIQUIFILM TEARS) 1.4 % ophthalmic solution 1 drop  1 drop Both Eyes BID Guilford Shi, MD   1 drop at 08/11/19 1040  . potassium chloride SA (KLOR-CON) CR tablet 20 mEq  20 mEq Oral Daily Guilford Shi, MD   20 mEq at 08/11/19 1038  . prenatal multivitamin tablet 1 tablet  1 tablet Oral Daily Guilford Shi, MD   1 tablet at 08/11/19 1038     Discharge Medications: Please see discharge summary for a list of discharge medications.  Relevant Imaging Results:  Relevant Lab Results:   Additional Information SSN [id number]  Lia Hopping, LCSW

## 2019-08-11 NOTE — Progress Notes (Addendum)
PROGRESS NOTE    [name]  BZJ:[id number]  DOB: Dec 09, 1916  PCP: Josetta Huddle, MD Admit date:08/09/2019 Chief compliant:  recent fall, headache, generalized weakness 86 y.o. female with history h/o CAD, CHF, CKD stage III, diverticulosis, hypertension/hyperlipidemia, pulmonary HTN, vertigo and depression who lives alone at home and has caregivers coming during the daytime but none at night, who is also a walker dependent at baseline presents today with complaints of generalized weakness and inability to function at home.  She reports left frontal headache since she fell on 6/27 at home while walking out of her bathroom using her walker and hit her head.  Patient states her legs gave away that particular day and since then she has had overall decline in function.  ED Course: Afebrile.  CT head revealed 12 mm subacute subdural hematoma on the left with mild midline shift to the right.  Case was discussed with neurosurgery on-call who recommended conservative management and outpatient follow-up.  Family however concern regarding her ability to ambulate, perform ADLs, lives alone and patient admitted for further evaluation and management. Hospital course: Patient has reported headache and also noted to have left knee swelling/pain when she bears weight.  She is also noted to be orthostatic limiting her ambulation.  Subjective:  Patient confused and delirious today.  Apparently did not sleep well last night.  Her knee swelling appears to have improved and she is moving her leg well.  Nephew at bedside and concerned about her confusion.  Objective: Vitals:   08/10/19 2211 08/11/19 0514 08/11/19 1034 08/11/19 1419  BP: (!) 147/71 128/66 133/65 (!) 149/68  Pulse: 74 60 68 66  Resp: 20 20 18 18   Temp:  97.7 F (36.5 C)  (!) 97.5 F (36.4 C)  TempSrc:  Oral    SpO2: 100% 97% 97% 99%    Intake/Output Summary (Last 24 hours) at 08/11/2019 1435 Last data filed at 08/11/2019 0900 Gross  per 24 hour  Intake 1271.71 ml  Output 100 ml  Net 1171.71 ml   There were no vitals filed for this visit.  Physical Examination: General: thin built elderly female,  no acute distress noted Head ENT: hard of hearing,  normocephalic, PERRLA, neck supple Heart: S1-S2 heard, regular rate and rhythm, no murmurs.   No pedal edema. Lungs: Equal air entry bilaterally, no rhonchi or rales on exam, no accessory muscle use Abdomen: Bowel sounds heard, soft, nontender, nondistended. No organomegaly.  No CVA tenderness Extremities: Left knee swelling much improved today, not tender and noted improved range of motion, lower extremity with prominent veins, no significant edema.  No cyanosis or clubbing. Neurological: Awake, alert but confused and delirious, moving all extremities.  Hard of hearing Skin: No wounds or rashes.   Data Reviewed: I have personally reviewed following labs and imaging studies  CBC: Recent Labs  Lab 08/09/19 1239 08/10/19 0335  WBC 9.3 7.8  NEUTROABS 5.4  --   HGB 10.7* 12.7  HCT 33.6* 38.4  MCV 95.5 93.7  PLT 156 017   Basic Metabolic Panel: Recent Labs  Lab 08/09/19 1239 08/10/19 0335  NA 141 140  K 3.7 3.6  CL 106 105  CO2 27 24  GLUCOSE 95 102*  BUN 16 12  CREATININE 0.89 0.69  CALCIUM 8.9 8.7*   GFR: CrCl cannot be calculated (Unknown ideal weight.). Liver Function Tests: Recent Labs  Lab 08/09/19 1239  AST 20  ALT 9  ALKPHOS 79  BILITOT 0.7  PROT 7.0  ALBUMIN  3.2*   No results for input(s): LIPASE, AMYLASE in the last 168 hours. No results for input(s): AMMONIA in the last 168 hours. Coagulation Profile: Recent Labs  Lab 08/09/19 1405  INR 1.1   Cardiac Enzymes: Recent Labs  Lab 08/09/19 1405  CKTOTAL 122   BNP (last 3 results) No results for input(s): PROBNP in the last 8760 hours. HbA1C: No results for input(s): HGBA1C in the last 72 hours. CBG: No results for input(s): GLUCAP in the last 168 hours. Lipid Profile: No  results for input(s): CHOL, HDL, LDLCALC, TRIG, CHOLHDL, LDLDIRECT in the last 72 hours. Thyroid Function Tests: No results for input(s): TSH, T4TOTAL, FREET4, T3FREE, THYROIDAB in the last 72 hours. Anemia Panel: No results for input(s): VITAMINB12, FOLATE, FERRITIN, TIBC, IRON, RETICCTPCT in the last 72 hours. Sepsis Labs: No results for input(s): PROCALCITON, LATICACIDVEN in the last 168 hours.  Recent Results (from the past 240 hour(s))  Urine culture     Status: Abnormal   Collection Time: 08/09/19  2:05 PM   Specimen: Urine, Random  Result Value Ref Range Status   Specimen Description   Final    URINE, RANDOM Performed at [facility] [address] [place], [name] [address]    Special Requests   Final    NONE Performed at [facility], [place] [address], [place], [place] [address]    Culture MULTIPLE SPECIES PRESENT, SUGGEST RECOLLECTION (A)  Final   Report Status 08/11/2019 FINAL  Final  SARS Coronavirus 2 by RT PCR (hospital order, performed in [facility] hospital lab) Nasopharyngeal Nasopharyngeal Swab     Status: None   Collection Time: 08/09/19  4:00 PM   Specimen: Nasopharyngeal Swab  Result Value Ref Range Status   SARS Coronavirus 2 NEGATIVE NEGATIVE Final    Comment: (NOTE) SARS-CoV-2 target nucleic acids are NOT DETECTED.  The SARS-CoV-2 RNA is generally detectable in upper and lower respiratory specimens during the acute phase of infection. The lowest concentration of SARS-CoV-2 viral copies this assay can detect is 250 copies / mL. A negative result does not preclude SARS-CoV-2 infection and should not be used as the sole basis for treatment or other patient management decisions.  A negative result may occur with improper specimen collection / handling, submission of specimen other than nasopharyngeal swab, presence of viral mutation(s) within the areas targeted by this assay, and inadequate number of viral copies (<250  copies / mL). A negative result must be combined with clinical observations, patient history, and epidemiological information.  Fact Sheet for Patients:   StrictlyIdeas.no  Fact Sheet for Healthcare Providers: BankingDealers.co.za  This test is not yet approved or  cleared by the [place] FDA and has been authorized for detection and/or diagnosis of SARS-CoV-2 by FDA under an Emergency Use Authorization (EUA).  This EUA will remain in effect (meaning this test can be used) for the duration of the COVID-19 declaration under Section 564(b)(1) of the Act, 21 U.S.C. section 360bbb-3(b)(1), unless the authorization is terminated or revoked sooner.  Performed at [facility], [place] [address], [place], [place] [address]       Radiology Studies: No results found.    Scheduled Meds: . citalopram  20 mg Oral Daily  . diclofenac Sodium  2 g Topical QID  . furosemide  20 mg Oral Q M,W,F  . metoprolol succinate  25 mg Oral Daily  . pantoprazole  40 mg Oral Q M,W,F  . polyvinyl alcohol  1 drop  Both Eyes BID  . potassium chloride SA  20 mEq Oral Daily  . prenatal multivitamin  1 tablet Oral Daily   Continuous Infusions: . sodium chloride 75 mL/hr at 08/11/19 0600     Assessment/Plan:  1.  Subacute subdural hematoma: Mild midline shift reported on CT. she has been complaining of headache during the hospital course however Patient confused and delirious today.  Will repeat CT head to rule out progression.  Case was discussed by ED physician with neurosurgery on admission who did not feel patient was a surgical candidate given advanced age and relatively asymptomatic.  Family understands overall poor prognosis given advanced age and multiple comorbidities.  Consulted PT/OT who recommended home with 24-hour supervision versus SNF-family wishes SNF placement.  Seen by palliative care team-appreciate assistance  2.  Dizzy  spells: Patient reports feeling like she is floating when she is upright.  He had positive orthostatics on BP check with significant hypotension on standing to systolic 92E.  DCed Norvasc and nitrates which are both venodilator's and probably contributing to venous stasis when she stands up. TED hose ordered.  She received IV hydration overnight.  Repeat orthostatic blood pressure checks today. She does have history of vertigo and uses meclizine as needed.  3.  Chronic diastolic CHF, moderate pulmonary hypertension: Patient does report dyspnea on exertion which is chronic.  Never been on home O2.  On Lasix 20 mg daily.  Chest x-ray in this admission unremarkable.  Requested O2 desat studies on PT eval (but none documented).  Currently on room air and appears to be saturating well with no distress.  4.    Acute metabolic encephalopathy/delirium: Secondary to problem #1 versus steroid-induced psychosis versus hospital delirium.  Avoid further steroids.  Repeat CT head as discussed above.  Will try Zyprexa x1 as patient very restless.  Ordered Chartered loss adjuster.  Standard delirium precautions to be followed, nephew plans to stay in her room until later today.  Added low-dose hydroxyzine for anxiety.  5.  CKD stage IIIa: Patient's baseline GFR around 50s with creatinine around 0.8.  Currently stable and without any acute decline.  6.  Bilateral knee arthritis: X-rays of the knees were obtained due to patient's recent fall and complaint of legs giving away.  X-rays do reveal severe degenerative disease and joint swelling in the left lower extremity. Would avoid NSAIDs in this elderly patient given problem #1 and problem #5.  Given significant swelling, tenderness and decreased range of motion along the left knee joint, ordered 1 dose of steroids yesterday-swelling appears much improved today.  However given delirium, will avoid further steroids.  Will order Voltaren gel.. Patient apparently had  intra-articular joint injection on the right side 2 weeks back by Dr. Inda Merlin.  7. CAD: Not on any antiplatelet agents per home med list.  Will hold off on any given problem #1.  Resume other home medications.  Palliative care consulted for care goals.  8.  Goals of care: Consulted palliative care and PT/social work for further discussions on care goals as well as placement options.  Appreciate their evaluation and input.     DVT prophylaxis: SCDs Code Status: DNR Family / Patient Communication: Discussed with patient, nephew and niece at bedside Disposition Plan:   Status is: Inpatient (patient has medical necessity for greater than 2 midnight inpatient stay given symptomatic orthostatic hypotension and need for IV hydration, inability to ambulate with severe left knee osteoarthritis/swelling requiring acute interventions)  Remains inpatient appropriate because:Hemodynamically unstable  Dispo: The patient is from: Home              Anticipated d/c is to: SNF              Anticipated d/c date is: 2 days              Patient currently is not medically stable to d/c.            Time spent: 35 minutes     >50% time spent in discussions with care team and coordination of care.    Guilford Shi, MD [facility] Pager in [place]  If 7PM-7AM, please contact night-coverage [email or web address] 08/11/2019, 2:35 PM

## 2019-08-11 NOTE — Consult Note (Signed)
Palliative Care  Consultation Reason: Goals of Care  [name] is a 86 yo woman admitted with SDH, managing conservatively who prior to admission lived alone with caregivers in the home as needed and during the day. She had a fall on 6/27 and developed a HA and dizziness and was brought in for evaluation.   She is doing remarkably well given the SDH and mostly complains of word finding difficulties- she is frustrated because she "cant get her words out". She is very HOH but is able to tell me that she knows she has a bleed in her head that is causing her symptoms.  There is no family at bedside today- but she will need 24/7 care and supervision -likely for the duration of her life. It is also difficult to know how this bleed will progress and the impact on her cognition and communication, but for now she seems to be quite stable and is also participating with Therapy and is OOB and ambulating with a walker and assist.  She has a DNR -will need to make sure a copy of this is scanned in to [facility] at the time of discharge- I will contact her family and request her ACP documents.  She is a good candidate for community based palliative care to follow at rehab and also at home if she is able to transition back there. Depending on patient and family resources she could be cared for at home with 24/7 in home caregivers and even hospice if she has a functional status decline and goals remain QOL and comfort.  Will continue to follow closely and monitor for changes.  Lane Hacker, DO Palliative Medicine [phone]  Time: 30 minutes Greater than 50%  of this time was spent counseling and coordinating care related to the above assessment and plan.'

## 2019-08-11 NOTE — TOC Initial Note (Addendum)
Transition of Care [facility]) - Initial/Assessment Note    Patient Details  Name: [name] MRN: [id number] Date of Birth: [birth year]  [facility] CM/SW Contact:    [name], [place] Phone Number: 08/11/2019, 3:15 PM  Clinical Narrative:                 CSW met with the patient and her nephew [name] at bedside. Patient alert to self and very hard of hearing. Pt. Nephew reports the patient lives alone with support from caregivers during the day but not during the evening. The patient can toilet and bathe herself with assistance from her caregiver. The patient uses a rolling walker to ambulate. The patient nephew and niece check in with  the patient frequently during the week. Pt. nephew reports the patient has been to rehab in the past and provided the name of some facility choices.CSW explain the SNF process, and explain medicare requirement for 3 inpatient midnight stay/facilitycoverage. Patient nephew report understanding.  FL2 completed.   CSW provided list of SNF options/medicare.gov list. Patient nephew chose [facility]. CSW confirm bed availability and notified them the patient has been vaccinated.  Patient nephew is requesting the patient transport by PTAR at discharge.   TOC staff will continue to follow this patient.   Expected Discharge Plan: Skilled Nursing Facility Barriers to Discharge: Continued Medical Work up, Ship broker   Patient Goals and CMS Choice Patient states their goals for this hospitalization and ongoing recovery are:: Per nephew, we want her to go to rehab and get better. CMS Medicare.gov Compare Post Acute Care list provided to:: Other (Comment Required) (Nephew) Choice offered to / list presented to :  [name])  Expected Discharge Plan and Services Expected Discharge Plan: [place] In-house Referral: Clinical Social Work   Post Acute Care Choice: [place] Living arrangements for the past  2 months: Apartment                 DME Arranged: N/A DME Agency: NA       HH Arranged: NA [place] Agency: NA        Prior Living Arrangements/Services Living arrangements for the past 2 months: Apartment Lives with:: Self Patient language and need for interpreter reviewed:: No Do you feel safe going back to the place where you live?: Yes      Need for Family Participation in Patient Care: Yes (Comment) Care giver support system in place?: Yes (comment)   Criminal Activity/Legal Involvement Pertinent to Current Situation/Hospitalization: No - Comment as needed  Activities of Daily Living Home Assistive Devices/Equipment: Walker (specify type), Built-in shower seat ADL Screening (condition at time of admission) Patient's cognitive ability adequate to safely complete daily activities?: Yes Is the patient deaf or have difficulty hearing?: Yes Does the patient have difficulty seeing, even when wearing glasses/contacts?: No Does the patient have difficulty concentrating, remembering, or making decisions?: Yes Patient able to express need for assistance with ADLs?: Yes Does the patient have difficulty dressing or bathing?: No Independently performs ADLs?: Yes (appropriate for developmental age) Does the patient have difficulty walking or climbing stairs?: Yes Weakness of Legs: Both Weakness of Arms/Hands: Both  Permission Sought/Granted Permission sought to share information with : Family Supports, Chartered certified accountant granted to share information with : Yes, Verbal Permission Granted  Share Information with NAME: [name]  Permission granted to share info w AGENCY: SNF's in the area  Permission granted to share info w Relationship: Nephew  Permission granted to share info w Contact Information: [phone]  Emotional Assessment Appearance:: Appears stated age Attitude/Demeanor/Rapport: [name]   Orientation: : Oriented to Self Alcohol / Substance  Use: Not Applicable Psych Involvement: No (comment)  Admission diagnosis:  Subdural hematoma (HCC) [S06.5X9A] SDH (subdural hematoma) ([place]) [S06.5X9A] Physical deconditioning [R53.81] Fall, initial encounter [W19.XXXA] Patient Active Problem List   Diagnosis Date Noted  . DNR (do not resuscitate) 08/11/2019  . SDH (subdural hematoma) ([place]) 08/09/2019  . Acute lower UTI 01/02/2018  . Acute cystitis without hematuria   . Acute encephalopathy 01/01/2018  . DJD (degenerative joint disease) 08/09/2016  . GI bleed 08/08/2016  . Abnormal echocardiogram 06/14/2016  . PVC's (premature ventricular contractions)   . Bacteremia 09/18/2014  . Fever   . Febrile illness 09/12/2014  . Diverticulitis 09/12/2014  . CKD (chronic kidney disease) stage 3, GFR 30-59 ml/min (HCC) 09/12/2014  . HTN (hypertension) 09/12/2014  . Thrombocytopenia ([place]) 09/12/2014  . Abnormal stress test 01/13/2014  . Rectal bleed 08/10/2013  . Acute renal failure ([place]) 08/10/2013  . Essential hypertension, benign 01/08/2013  . DOE (dyspnea on exertion) 01/08/2013   PCP:  Morgan Huddle, MD Pharmacy:   [facility] [id number] [address], [place] [address] [place] [address] Phone: [phone] Fax: [phone]     Social Determinants of Health (SDOH) Interventions    Readmission Risk Interventions No flowsheet data found.

## 2019-08-11 NOTE — Progress Notes (Signed)
Received report of CT scan from radiology, results relayed to MD via secure chat.

## 2019-08-12 DIAGNOSIS — R410 Disorientation, unspecified: Secondary | ICD-10-CM | POA: Diagnosis not present

## 2019-08-12 DIAGNOSIS — S065X9A Traumatic subdural hemorrhage with loss of consciousness of unspecified duration, initial encounter: Secondary | ICD-10-CM | POA: Diagnosis not present

## 2019-08-12 DIAGNOSIS — Z7189 Other specified counseling: Secondary | ICD-10-CM | POA: Diagnosis not present

## 2019-08-12 DIAGNOSIS — I951 Orthostatic hypotension: Secondary | ICD-10-CM | POA: Diagnosis not present

## 2019-08-12 NOTE — Progress Notes (Signed)
PROGRESS NOTE    [name]  RDE:[id number]  DOB: 09/21/1916  PCP: Josetta Huddle, MD Admit date:08/09/2019 Chief compliant:  recent fall, headache, generalized weakness 86 y.o. female with history h/o CAD, CHF, CKD stage III, diverticulosis, hypertension/hyperlipidemia, pulmonary HTN, vertigo and depression who lives alone at home and has caregivers coming during the daytime but none at night, who is also a walker dependent at baseline presents today with complaints of generalized weakness and inability to function at home.  She reports left frontal headache since she fell on 6/27 at home while walking out of her bathroom using her walker and hit her head.  Patient states her legs gave away that particular day and since then she has had overall decline in function.  ED Course: Afebrile.  CT head revealed 12 mm subacute subdural hematoma on the left with mild midline shift to the right.  Case was discussed with neurosurgery on-call who recommended conservative management and outpatient follow-up.  Family however concern regarding her ability to ambulate, perform ADLs, lives alone and patient admitted for further evaluation and management. Hospital course: Patient has reported headache and also noted to have left knee swelling/pain when she bears weight.  She is also noted to be orthostatic limiting her ambulation.  Subjective:  Patient sleeping comfortably this morning.  Appears to be in deep sleep after being awake for at least 36 hours.  Nephew not at bedside today.  Objective: Vitals:   08/11/19 1419 08/11/19 2137 08/12/19 0422 08/12/19 1145  BP: (!) 149/68 113/63 137/66 (!) 145/60  Pulse: 66 69 63 65  Resp: 18 20 18 16   Temp: (!) 97.5 F (36.4 C) 98 F (36.7 C) 98 F (36.7 C) 98.2 F (36.8 C)  TempSrc:  Oral Oral Oral  SpO2: 99% 95% 96% 96%    Intake/Output Summary (Last 24 hours) at 08/12/2019 1241 Last data filed at 08/12/2019 1203 Gross per 24 hour  Intake 825.81 ml    Output 425 ml  Net 400.81 ml   There were no vitals filed for this visit.  Physical Examination: General: thin built elderly female,  no acute distress noted Head ENT: hard of hearing,  normocephalic, PERRLA, neck supple Heart: S1-S2 heard, regular rate and rhythm, no murmurs.   No pedal edema. Lungs: Equal air entry bilaterally, no rhonchi or rales on exam, no accessory muscle use Abdomen: Bowel sounds heard, soft, nontender, nondistended. No organomegaly.  No CVA tenderness Extremities: Left knee swelling improving with better range of motion, lower extremity with prominent veins, no significant edema.  No cyanosis or clubbing. Neurological: Somnolent/sedated.  Hard of hearing Skin: No wounds or rashes.   Data Reviewed: I have personally reviewed following labs and imaging studies  CBC: Recent Labs  Lab 08/09/19 1239 08/10/19 0335  WBC 9.3 7.8  NEUTROABS 5.4  --   HGB 10.7* 12.7  HCT 33.6* 38.4  MCV 95.5 93.7  PLT 156 631   Basic Metabolic Panel: Recent Labs  Lab 08/09/19 1239 08/10/19 0335  NA 141 140  K 3.7 3.6  CL 106 105  CO2 27 24  GLUCOSE 95 102*  BUN 16 12  CREATININE 0.89 0.69  CALCIUM 8.9 8.7*   GFR: CrCl cannot be calculated (Unknown ideal weight.). Liver Function Tests: Recent Labs  Lab 08/09/19 1239  AST 20  ALT 9  ALKPHOS 79  BILITOT 0.7  PROT 7.0  ALBUMIN 3.2*   No results for input(s): LIPASE, AMYLASE in the last 168 hours. No results for  input(s): AMMONIA in the last 168 hours. Coagulation Profile: Recent Labs  Lab 08/09/19 1405  INR 1.1   Cardiac Enzymes: Recent Labs  Lab 08/09/19 1405  CKTOTAL 122   BNP (last 3 results) No results for input(s): PROBNP in the last 8760 hours. HbA1C: No results for input(s): HGBA1C in the last 72 hours. CBG: No results for input(s): GLUCAP in the last 168 hours. Lipid Profile: No results for input(s): CHOL, HDL, LDLCALC, TRIG, CHOLHDL, LDLDIRECT in the last 72 hours. Thyroid Function  Tests: No results for input(s): TSH, T4TOTAL, FREET4, T3FREE, THYROIDAB in the last 72 hours. Anemia Panel: No results for input(s): VITAMINB12, FOLATE, FERRITIN, TIBC, IRON, RETICCTPCT in the last 72 hours. Sepsis Labs: No results for input(s): PROCALCITON, LATICACIDVEN in the last 168 hours.  Recent Results (from the past 240 hour(s))  Urine culture     Status: Abnormal   Collection Time: 08/09/19  2:05 PM   Specimen: Urine, Random  Result Value Ref Range Status   Specimen Description   Final    URINE, RANDOM Performed at [facility] [address] [place], [place] [address]    Special Requests   Final    NONE Performed at [facility], [place] [address], [place], [place] [address]    Culture MULTIPLE SPECIES PRESENT, SUGGEST RECOLLECTION (A)  Final   Report Status 08/11/2019 FINAL  Final  SARS Coronavirus 2 by RT PCR (hospital order, performed in [facility] hospital lab) Nasopharyngeal Nasopharyngeal Swab     Status: None   Collection Time: 08/09/19  4:00 PM   Specimen: Nasopharyngeal Swab  Result Value Ref Range Status   SARS Coronavirus 2 NEGATIVE NEGATIVE Final    Comment: (NOTE) SARS-CoV-2 target nucleic acids are NOT DETECTED.  The SARS-CoV-2 RNA is generally detectable in upper and lower respiratory specimens during the acute phase of infection. The lowest concentration of SARS-CoV-2 viral copies this assay can detect is 250 copies / mL. A negative result does not preclude SARS-CoV-2 infection and should not be used as the sole basis for treatment or other patient management decisions.  A negative result may occur with improper specimen collection / handling, submission of specimen other than nasopharyngeal swab, presence of viral mutation(s) within the areas targeted by this assay, and inadequate number of viral copies (<250 copies / mL). A negative result must be combined with clinical observations, patient history, and  epidemiological information.  Fact Sheet for Patients:   StrictlyIdeas.no  Fact Sheet for Healthcare Providers: BankingDealers.co.za  This test is not yet approved or  cleared by the [place] FDA and has been authorized for detection and/or diagnosis of SARS-CoV-2 by FDA under an Emergency Use Authorization (EUA).  This EUA will remain in effect (meaning this test can be used) for the duration of the COVID-19 declaration under Section 564(b)(1) of the Act, 21 U.S.C. section 360bbb-3(b)(1), unless the authorization is terminated or revoked sooner.  Performed at [facility], [place] [address] [place],  [address]       Radiology Studies: CT HEAD WO CONTRAST  Result Date: 08/11/2019 CLINICAL DATA:  Follow-up subdural hematoma confusion, delirium. EXAM: CT HEAD WITHOUT CONTRAST TECHNIQUE: Contiguous axial images were obtained from the base of the skull through the vertex without intravenous contrast. COMPARISON:  Prior head CT examinations 08/09/2019 and earlier. FINDINGS: Brain: There is unchanged size of a subdural hematoma overlying the left frontoparietal convexity, again measuring up to 12 mm in greatest thickness. As before, the  hematoma is predominantly intermediate to low density and likely subacute. However, small wisps of hyperdensity within the collection anteriorly and posterior appear subtly more numerous as compared to the head CT of 08/09/2019, and this may reflect mild interval bleeding into the collection. Similar mass effect upon the underlying left cerebral hemisphere. Unchanged 3 mm rightward midline shift. Stable background generalized parenchymal atrophy and chronic small vessel ischemic disease. No acute demarcated cortical infarction is identified. Vascular: No hyperdense vessel.  Atherosclerotic calcifications. Skull: Normal. Negative for fracture or focal lesion. Sinuses/Orbits: No acute orbital  abnormality. No significant paranasal sinus disease or mastoid effusion. These results will be called to the ordering clinician or representative by the Radiologist Assistant, and communication documented in the PACS or [employer]. IMPRESSION: Unchanged size of a subdural hematoma overlying the left frontoparietal convexity, again measuring up to 12 mm in greatest thickness. As before, the hematoma is predominantly intermediate to low density and likely subacute. However, small wisps of hyperdensity within the collection anteriorly and posterior appear subtly more numerous as compared to the head CT of 08/09/2019, and this may reflect mild interval bleeding into the collection. Unchanged mass effect upon the left cerebral hemisphere with persistent 3 mm rightward midline shift. Stable background generalized parenchymal atrophy and chronic small vessel ischemic disease. Electronically Signed   By: Kellie Simmering DO   On: 08/11/2019 17:09      Scheduled Meds: . citalopram  20 mg Oral Daily  . diclofenac Sodium  2 g Topical QID  . furosemide  20 mg Oral Q M,W,F  . metoprolol succinate  25 mg Oral Daily  . pantoprazole  40 mg Oral Q M,W,F  . polyvinyl alcohol  1 drop Both Eyes BID  . potassium chloride SA  20 mEq Oral Daily  . prenatal multivitamin  1 tablet Oral Daily   Continuous Infusions: . sodium chloride 50 mL/hr at 08/11/19 1504     Assessment/Plan:  1.  Subacute subdural hematoma: Mild midline shift reported on CT. she has been complaining of headache during the hospital course however Patient confused and delirious yesterday.  Repeat CT head essentially unchanged since admission.  Per neurosurgery on admission, patient not a surgical candidate given advanced age and relatively asymptomatic.  Family understands overall poor prognosis given advanced age and multiple comorbidities.  Consulted PT/OT who recommended home with 24-hour supervision versus SNF-family wishes SNF placement.  Seen  by palliative care team-appreciate assistance  2.  Dizzy spells: Patient reports feeling like she is floating when she is upright.  He had positive orthostatics on BP check with significant hypotension on standing to systolic 69S.  DCed Norvasc and nitrates which are both venodilator's and probably contributing to venous stasis when she stands up. TED hose ordered.  She received IV hydration overnight on 7/13 and on maintenance fluids now due to confusion and inability to take p.o.  Repeat orthostatic when more alert and cooperative. She does have history of vertigo and uses meclizine as needed.  3.  Chronic diastolic CHF, moderate pulmonary hypertension: Patient does report dyspnea on exertion which is chronic.  Never been on home O2.  On Lasix 20 mg daily.  Chest x-ray in this admission unremarkable.  Requested O2 desat studies on PT eval (but none documented).  Currently on room air and appears to be saturating well with no distress.  4.    Acute metabolic encephalopathy/delirium: Secondary to problem #1 versus steroid-induced psychosis versus hospital delirium.  Avoid further steroids.  Repeat CT head  unremarkable as discussed above. Standard delirium precautions, received Zyprexa x1 and added low-dose hydroxyzine for anxiety as needed, would avoid benzodiazepines in this elderly patient..  5.  CKD stage IIIa: Patient's baseline GFR around 50s with creatinine around 0.8.  Currently stable and without any acute decline.  6.  Bilateral knee arthritis: X-rays of the knees were obtained due to patient's recent fall and complaint of legs giving away.  X-rays do reveal severe degenerative disease and joint swelling in the left lower extremity. Would avoid NSAIDs in this elderly patient given problem #1 and problem #5.  Given significant swelling, tenderness and decreased range of motion along the left knee joint, ordered 1 dose of steroids yesterday-swelling appears much improved today.  However given  delirium, will avoid further steroids.  Continue Voltaren gel.. Patient apparently had intra-articular joint injection on the right side 2 weeks back by Dr. Inda Merlin.  7. CAD: Not on any antiplatelet agents per home med list.  Will hold off on any given problem #1.  Resume other home medications.  Palliative care consulted for care goals, appreciate their input.  8.  Goals of care: Consulted palliative care and PT/social work for further discussions on care goals as well as placement options.  Appreciate their evaluation and input.     DVT prophylaxis: SCDs Code Status: DNR Family / Patient Communication: Discussed with nephew yesterday Disposition Plan:   Status is: Inpatient (patient has medical necessity for greater than 2 midnight inpatient stay given symptomatic orthostatic hypotension and need for IV hydration, inability to ambulate with severe left knee osteoarthritis/swelling requiring acute interventions)  Remains inpatient appropriate because: Delirium, IV fluids, inability to take p.o.   Dispo: The patient is from: Home              Anticipated d/c is to: SNF -bed available for tomorrow per SW, discharge planning if mental status improves.              Anticipated d/c date is: 1-2 days              Patient currently is not medically stable to d/c.            Time spent: 35 minutes     >50% time spent in discussions with care team and coordination of care.    Guilford Shi, MD [facility] Pager in [place]  If 7PM-7AM, please contact night-coverage [email or web address] 08/12/2019, 12:41 PM

## 2019-08-12 NOTE — Plan of Care (Signed)

## 2019-08-12 NOTE — Progress Notes (Signed)
OT Cancellation Note  Patient Details Name: [name] MRN: [id number] DOB: 28-Mar-1916   Cancelled Treatment:    Reason Eval/Treat Not Completed: Other (comment) Upon arrival NA present and patient sleeping soundly. NA reports patient finally asleep after ~36hrs awake. Will re-attempt as schedule permits.  Delbert Phenix OT Pager: [place] 08/12/2019, 10:53 AM

## 2019-08-13 DIAGNOSIS — G9341 Metabolic encephalopathy: Secondary | ICD-10-CM | POA: Diagnosis not present

## 2019-08-13 DIAGNOSIS — R2681 Unsteadiness on feet: Secondary | ICD-10-CM | POA: Diagnosis not present

## 2019-08-13 DIAGNOSIS — R278 Other lack of coordination: Secondary | ICD-10-CM | POA: Diagnosis not present

## 2019-08-13 DIAGNOSIS — K5790 Diverticulosis of intestine, part unspecified, without perforation or abscess without bleeding: Secondary | ICD-10-CM | POA: Diagnosis not present

## 2019-08-13 DIAGNOSIS — Z7401 Bed confinement status: Secondary | ICD-10-CM | POA: Diagnosis not present

## 2019-08-13 DIAGNOSIS — N1831 Chronic kidney disease, stage 3a: Secondary | ICD-10-CM | POA: Diagnosis not present

## 2019-08-13 DIAGNOSIS — G9340 Encephalopathy, unspecified: Secondary | ICD-10-CM | POA: Diagnosis not present

## 2019-08-13 DIAGNOSIS — I10 Essential (primary) hypertension: Secondary | ICD-10-CM | POA: Diagnosis not present

## 2019-08-13 DIAGNOSIS — M13862 Other specified arthritis, left knee: Secondary | ICD-10-CM | POA: Diagnosis not present

## 2019-08-13 DIAGNOSIS — N183 Chronic kidney disease, stage 3 unspecified: Secondary | ICD-10-CM | POA: Diagnosis not present

## 2019-08-13 DIAGNOSIS — I5030 Unspecified diastolic (congestive) heart failure: Secondary | ICD-10-CM | POA: Diagnosis not present

## 2019-08-13 DIAGNOSIS — I2510 Atherosclerotic heart disease of native coronary artery without angina pectoris: Secondary | ICD-10-CM | POA: Diagnosis not present

## 2019-08-13 DIAGNOSIS — M2550 Pain in unspecified joint: Secondary | ICD-10-CM | POA: Diagnosis not present

## 2019-08-13 DIAGNOSIS — W19XXXS Unspecified fall, sequela: Secondary | ICD-10-CM

## 2019-08-13 DIAGNOSIS — Z9181 History of falling: Secondary | ICD-10-CM | POA: Diagnosis not present

## 2019-08-13 DIAGNOSIS — I272 Pulmonary hypertension, unspecified: Secondary | ICD-10-CM | POA: Diagnosis not present

## 2019-08-13 DIAGNOSIS — M13861 Other specified arthritis, right knee: Secondary | ICD-10-CM | POA: Diagnosis not present

## 2019-08-13 DIAGNOSIS — N179 Acute kidney failure, unspecified: Secondary | ICD-10-CM | POA: Diagnosis not present

## 2019-08-13 DIAGNOSIS — R296 Repeated falls: Secondary | ICD-10-CM | POA: Diagnosis not present

## 2019-08-13 DIAGNOSIS — F329 Major depressive disorder, single episode, unspecified: Secondary | ICD-10-CM | POA: Diagnosis not present

## 2019-08-13 DIAGNOSIS — S065X9A Traumatic subdural hemorrhage with loss of consciousness of unspecified duration, initial encounter: Secondary | ICD-10-CM | POA: Diagnosis not present

## 2019-08-13 DIAGNOSIS — M25562 Pain in left knee: Secondary | ICD-10-CM | POA: Diagnosis not present

## 2019-08-13 DIAGNOSIS — R41841 Cognitive communication deficit: Secondary | ICD-10-CM | POA: Diagnosis not present

## 2019-08-13 DIAGNOSIS — I951 Orthostatic hypotension: Secondary | ICD-10-CM

## 2019-08-13 DIAGNOSIS — R5381 Other malaise: Secondary | ICD-10-CM | POA: Diagnosis not present

## 2019-08-13 DIAGNOSIS — R42 Dizziness and giddiness: Secondary | ICD-10-CM | POA: Diagnosis not present

## 2019-08-13 DIAGNOSIS — M6281 Muscle weakness (generalized): Secondary | ICD-10-CM | POA: Diagnosis not present

## 2019-08-13 DIAGNOSIS — S065X9D Traumatic subdural hemorrhage with loss of consciousness of unspecified duration, subsequent encounter: Secondary | ICD-10-CM | POA: Diagnosis not present

## 2019-08-13 DIAGNOSIS — E785 Hyperlipidemia, unspecified: Secondary | ICD-10-CM | POA: Diagnosis not present

## 2019-08-13 LAB — SARS CORONAVIRUS 2 BY RT PCR (HOSPITAL ORDER, PERFORMED IN ~~LOC~~ HOSPITAL LAB): SARS Coronavirus 2: NEGATIVE

## 2019-08-13 MED ORDER — HYDROXYZINE HCL 10 MG PO TABS: 10.0000 mg | ORAL_TABLET | Freq: Three times a day (TID) | ORAL | 0 refills | Status: DC | PRN

## 2019-08-13 MED ORDER — DICLOFENAC SODIUM 1 % EX GEL: 2.0000 g | Freq: Four times a day (QID) | CUTANEOUS | 0 refills | Status: DC

## 2019-08-13 NOTE — Care Management Important Message (Signed)
Important Message  Patient Details IM Letter presented to the Patient Name: [name] MRN: [id number] Date of Birth: 03-20-1916   Medicare Important Message Given:  Yes     Kerin Salen 08/13/2019, 2:58 PM

## 2019-08-13 NOTE — Progress Notes (Signed)
Mia, the receiving nurse at [facility], was provided with report on the pt. Mia denied any further questions. Currently waiting for PTAR to arrive for transport.

## 2019-08-13 NOTE — TOC Transition Note (Signed)
Transition of Care [facility]) - CM/SW Discharge Note   Patient Details  Name: [name] MRN: [id number] Date of Birth: 06/28/1916  [facility] of [facility]) CM/SW Contact:  Lennart Pall, LCSW Phone Number: 08/13/2019, 2:32 PM   Clinical Narrative:   Pt medically cleared for SNF dc to [facility].  PTAR en route for transfer.  No further TOC needs.    Final next level of care: Skilled Nursing Facility Barriers to Discharge: Barriers Resolved   Patient Goals and CMS Choice Patient states their goals for this hospitalization and ongoing recovery are:: Per nephew, we want her to go to rehab and get better. CMS Medicare.gov Compare Post Acute Care list provided to:: Other (Comment Required) (Nephew) Choice offered to / list presented to :  [facility])  Discharge Placement              Patient chooses bed at: [name] Patient to be transferred to facility by: [place] Name of family member notified: nephew Patient and family notified of of transfer: 08/13/19  Discharge Plan and Services In-house Referral: Clinical Social Work   Post Acute Care Choice: [place]          DME Arranged: N/A DME Agency: NA       HH Arranged: NA HH Agency: NA        Social Determinants of Health (SDOH) Interventions     Readmission Risk Interventions No flowsheet data found.

## 2019-08-13 NOTE — Progress Notes (Signed)
Occupational Therapy Treatment Patient Details Name: ANNIEBELLE DEVORE MRN: [id number] DOB: 09-14-1916 Today's Date: 08/13/2019    History of present illness 86 y.o. female with history h/o CAD, CHF, CKD stage III, diverticulosis, hypertension/hyperlipidemia, pulmonary HTN, vertigo and depression that fell on 6/27 at home while walking out of her bathroom. Since fall patient reporting L frontal headaches, CT + for 12 mm subdural hematoma with neuro recommending conservative management. Patient also with L knee pain + for L effusion.   OT comments  Patient requiring increased assistance this session, did sleep almost 24hrs straight due to not sleeping previous 2 days. Patient reporting dizziness with mobility, BP taken in chair see below in general comments. Patient transferred to bedside commode twice, first with no success. Patient was unable to ambulate into bathroom with walker having to transfer to bedside commode after approx 5 ft ambulation and mod A. Patient then stand pivot to recliner with mod A and cues for sequencing. After communicating with RN pt requesting to use commode again, mod A for stand pivot transfer to bedside commode and total A peri care after bowel movement. SNF rehab D/C remains appropriate.    Follow Up Recommendations  SNF;Supervision/Assistance - 24 hour    Equipment Recommendations  None recommended by OT       Precautions / Restrictions Precautions Precautions: Fall Precaution Comments: very HOH-even with hearing aids Restrictions Weight Bearing Restrictions: No       Mobility Bed Mobility Overal bed mobility: Needs Assistance Bed Mobility: Supine to Sit     Supine to sit: Min assist     General bed mobility comments: to elevate trunk  Transfers Overall transfer level: Needs assistance Equipment used: Rolling walker (2 wheeled) Transfers: Sit to/from [facility] Sit to Stand: Mod assist Stand pivot transfers: Mod assist        General transfer comment: requiring increased assist this session to power up to standing, stabilize and with eccentric control. cues for safety + sequencing    Balance Overall balance assessment: Needs assistance Sitting-balance support: Bilateral upper extremity supported;Feet supported Sitting balance-Leahy Scale: Good     Standing balance support: Bilateral upper extremity supported Standing balance-Leahy Scale: Poor Standing balance comment: reliant on external support and mod A                           ADL either performed or assessed with clinical judgement   ADL Overall ADL's : Needs assistance/impaired                         Toilet Transfer: Moderate assistance;Cueing for safety;Cueing for sequencing;BSC Toilet Transfer Details (indicate cue type and reason): first attempt to ambulate to bathroom, patient reports feeling dizzy with decreased stability therefore transferred to [facility]. patient did not void at this time. Patient reports needing bathroom after OT left to speak with her RN, returned to room patient mod A stand pivot to bedside commode with decreased safety, balance, stability Toileting- Clothing Manipulation and Hygiene: Total assistance;Sit to/from stand Toileting - Clothing Manipulation Details (indicate cue type and reason): decreased standing tolerance and balance, total A for peri care after bowel movement     Functional mobility during ADLs: Moderate assistance;Rolling walker;Cueing for sequencing;Cueing for safety General ADL Comments: patient requiring increased assistance from previous session, patient reporting dizziness with standing, checked BP and notified RN  Cognition Arousal/Alertness: Awake/alert Behavior During Therapy: WFL for tasks assessed/performed Overall Cognitive Status: Impaired/Different from baseline Area of Impairment: Problem solving;Safety/judgement                          Safety/Judgement: Decreased awareness of safety   Problem Solving: Slow processing;Requires verbal cues General Comments: patient still having difficulty with word finding              General Comments BP taken in sitting 109/69, taken again ~5 mins 118/72. semi reclined patient and patient stating less dizziness with this    Pertinent Vitals/ Pain       Pain Assessment: Faces Faces Pain Scale: Hurts little more Pain Location: L knee Pain Descriptors / Indicators: Aching;Sore Pain Intervention(s): Monitored during session         Frequency  Min 2X/week        Progress Toward Goals  OT Goals(current goals can now be found in the care plan section)  Progress towards OT goals: Not progressing toward goals - comment (patient slept 24 hrs not OOB)  Acute Rehab OT Goals Patient Stated Goal: "im gonna make it" OT Goal Formulation: With patient Time For Goal Achievement: 08/24/19 Potential to Achieve Goals: Good ADL Goals Pt Will Perform Grooming: with supervision;sitting;standing Pt Will Transfer to Toilet: with supervision;ambulating (walker, comfort height) Pt Will Perform Toileting - Clothing Manipulation and hygiene: with supervision;sit to/from stand;sitting/lateral leans Additional ADL Goal #1: Patient will accurately recall functional info in 2/3 trials in order to safely participate in daily routine.  Plan Discharge plan remains appropriate       AM-PAC OT "6 Clicks" Daily Activity     Outcome Measure   Help from another person eating meals?: None Help from another person taking care of personal grooming?: A Little Help from another person toileting, which includes using toliet, bedpan, or urinal?: A Lot Help from another person bathing (including washing, rinsing, drying)?: A Lot Help from another person to put on and taking off regular upper body clothing?: A Little Help from another person to put on and taking off regular lower body clothing?: A Lot 6  Click Score: 16    End of Session Equipment Utilized During Treatment: Rolling walker  OT Visit Diagnosis: History of falling (Z91.81);Muscle weakness (generalized) (M62.81);Pain Pain - Right/Left: Left Pain - part of body: Knee   Activity Tolerance Patient tolerated treatment well   Patient Left in chair;with call bell/phone within reach;with chair alarm set;with nursing/sitter in room   Nurse Communication Mobility status;Other (comment) (need pure wick, BP)        Time: 0388-8280 OT Time Calculation (min): 54 min  Charges: OT General Charges $OT Visit: 1 Visit OT Treatments $Self Care/Home Management : 53-67 mins  Delbert Phenix OT Pager: [place] 08/13/2019, 12:50 PM

## 2019-08-13 NOTE — Discharge Summary (Signed)
Physician Discharge Summary  [name] HDQ:[id number] DOB: 01-Nov-1916 DOA: 08/09/2019  PCP: Josetta Huddle, MD  Admit date: 08/09/2019 Discharge date: 08/13/2019 Consultations: Neurosurgery  Admitted From: home Disposition: SNF  Discharge Diagnoses:  Principal Problem:   SDH (subdural hematoma) ([place]) Active Problems:   Essential hypertension, benign   Acute renal failure (HCC)   CKD (chronic kidney disease) stage 3, GFR 30-59 ml/min (HCC)   HTN (hypertension)   Acute encephalopathy   Orthostatic hypotension   Falls, sequela   Hospital Course Summary: 86 y.o.femalewith history h/oCAD, CHF, CKD stage III, diverticulosis, hypertension/hyperlipidemia, pulmonary HTN, vertigo and depression who lives alone at home and has caregivers coming during the daytime but none at night, who is also a walker dependent at baseline presents today with complaints of generalized weakness and inability to function at home. She reports left frontal headache since she fell on 6/27 at home while walking out of her bathroom using her walker and hit her head. Patient states her legs gave away that particular day and since then she has had overall decline in function.  ED Course: Afebrile.  CT head revealed 12 mm subacute subdural hematoma on the left with mild midline shift to the right. Case was discussed with neurosurgery on-call who recommended conservative management and outpatient follow-up. Family however concern regarding her ability to ambulate, perform ADLs, lives alone and patient admitted for further evaluation and management. Hospital course: Patient reported headache and also noted to have left knee swelling/pain when she bears weight. She was also noted to be orthostatic limiting her ambulation.  She received a steroid injection on 7/13 for left knee pain/swelling.  On 7/14 she was delirious and repeat CT did not show any progression of subdural hemorrhage.  She was felt to have hospital  delirium versus steroid psychosis.  She received Zyprexa x1 dose, hydroxyzine as needed for anxiety.  She did not show much improvement with Zyprexa but improved after starting on hydroxyzine nightly.  She had a good sleep almost all day yesterday and today her mental status is much improved and back to baseline.  Per Education officer, museum she has a bed available at [facility] and is planned for discharge.  1.Subacute subdural hematoma: Mild midline shift reported on CT. she has been complaining of headache during the hospital course howeverPatient confused and delirious yesterday.  Repeat CT head essentially unchanged since admission.  Per neurosurgery on admission, patient not a surgical candidate given advanced age and relatively asymptomatic. Family understands overall poor prognosis given advanced age and multiple comorbidities. Consulted PT/OT who recommended home with 24-hour supervision versus SNF-family wishes SNF placement.  Seen by palliative care team-appreciate assistance  2.Dizzy spells: Patient reports feeling like she is floating when she is upright.  He had positive orthostatics on BP check with significant hypotension on standing to systolic 11H.  DCed Norvasc and nitrates which are both venodilator's and probably contributing to venous stasis when she stands up.TED hose ordered.  She received IV hydration overnight on 7/13 and on maintenance fluids through July 15 as she was not eating well.  Discontinued today as back to baseline..  Repeat orthostatics show improvement. She does have history of vertigo and uses meclizine as needed.  3.Chronic diastolic CHF, moderate pulmonary hypertension: Patient does report dyspnea on exertion which is chronic. Never been on home O2. On Lasix 20 mg daily. Chest x-ray in this admission unremarkable.  Currently on room air and appears to be saturating well with no distress at rest or on  exertion with PT eval.  4.  Acute metabolic  encephalopathy/delirium: Secondary to problem #1 versus steroid-induced psychosis versus hospital delirium.  Avoid further steroids.  Repeat CT head unremarkable as discussed above. Standard delirium precautions, received Zyprexa x1 and added low-dose hydroxyzine for anxiety as needed, would avoid benzodiazepines in this elderly patient.. Per home med list, patient was on Zyprexa 2.5 mg at bedtime as needed prior to admission.  5.CKD stage IIIa:Patient's baseline GFR around 50s with creatinine around 0.8. Currently stable and without any acute decline.  6.Bilateral knee arthritis: X-rays of the knees were obtained due to patient's recent fall and complaint of legs giving away. X-rays do reveal severe degenerative disease and joint swelling in the left lower extremity. Would avoid NSAIDs in this elderly patient given problem #1 and problem #5.  Given significant swelling, tenderness and decreased range of motion along the left knee joint, ordered 1 dose of steroids 7/13-swelling and range of motion much improved now.  However given delirium, will avoid further steroids.  Continue Voltaren gel.. Patient apparently had intra-articular joint injection on the right side 2 weeks back by Dr. Lucianne Lei follow-up upon discharge as needed.  7.CAD: Not on any antiplatelet agents per home med list. Will hold off on any given problem #1. Resume other home medications. Palliative care consulted for care goals, appreciate their input.  8. Goals of care: Consulted palliative care and PT/social work for further discussions on care goals as well as placement options.  Appreciate their evaluation and input.       Discharge Exam:   Vitals:   08/12/19 1443 08/12/19 2130 08/13/19 0534 08/13/19 1008  BP: 112/66 (!) 147/71 (!) 141/52 130/64  Pulse: (!) 55 73 69 67  Resp: 14 20 16    Temp: 98.1 F (36.7 C) 98 F (36.7 C) 97.9 F (36.6 C) 98.3 F (36.8 C)  TempSrc:  Oral Oral   SpO2: 99% 92% 95% 94%     General: Pt is alert, awake, not in acute distress Cardiovascular: RRR, S1/S2 +, no rubs, no gallops Respiratory: CTA bilaterally, no wheezing, no rhonchi Abdominal: Soft, NT, ND, bowel sounds + Extremities: no edema, no cyanosis, left knee swelling much improved  Discharge Condition:Stable CODE STATUS: DNR Diet recommendation: Heart healthy diet Recommendations for Outpatient Follow-up:  1. Follow up with PCP: At skilled nursing facility 2. Follow up with consultants: Palliative care at SNF, orthopedics Dr. Inda Merlin as needed 3. Please obtain follow up labs including:   Home Health services upon discharge:  Equipment/Devices upon discharge:   Discharge Instructions:  Discharge Instructions    Call MD for:  difficulty breathing, headache or visual disturbances   Complete by: As directed    Call MD for:  extreme fatigue   Complete by: As directed    Call MD for:  persistant dizziness or light-headedness   Complete by: As directed    Call MD for:  persistant nausea and vomiting   Complete by: As directed    Call MD for:  severe uncontrolled pain   Complete by: As directed    Call MD for:  temperature >100.4   Complete by: As directed    Diet - low sodium heart healthy   Complete by: As directed    Increase activity slowly   Complete by: As directed      Allergies as of 08/13/2019      Reactions   Ace Inhibitors Hives, Itching   Streptomycin Other (See Comments), Itching   Pruritus Pruritus  Tramadol Other (See Comments)   unknown   Vesicare [solifenacin Succinate] Itching   Penicillins Rash   Pruritic rash Has patient had a PCN reaction causing immediate rash, facial/tongue/throat swelling, SOB or lightheadedness with hypotension: unknown Has patient had a PCN reaction causing severe rash involving mucus membranes or skin necrosis: unknown Has patient had a PCN reaction that required hospitalization:unknown Has patient had a PCN reaction occurring within the last  10 years: yes If all of the above answers are "NO", then may proceed with Cephalosporin use.      Medication List    STOP taking these medications   amLODipine 2.5 MG tablet Commonly known as: NORVASC   Cephalexin 250 MG tablet   isosorbide mononitrate 30 MG 24 hr tablet Commonly known as: IMDUR   Premarin 0.3 MG tablet Generic drug: estrogens (conjugated)     TAKE these medications   acetaminophen 325 MG tablet Commonly known as: TYLENOL Take 325-650 mg by mouth every 6 (six) hours.   citalopram 20 MG tablet Commonly known as: CELEXA Take 20 mg by mouth daily.   clotrimazole 1 % cream Commonly known as: LOTRIMIN Apply 1 application topically 2 (two) times daily.   diclofenac Sodium 1 % Gel Commonly known as: VOLTAREN Apply 2 g topically QID.   furosemide 40 MG tablet Commonly known as: LASIX Take 20 mg by mouth as directed. Take 0.5 tablet by mouth Monday, Wednesday, Friday   hydrOXYzine 10 MG tablet Commonly known as: ATARAX/VISTARIL Take 1 tablet (10 mg total) by mouth 3 (three) times daily as needed for itching or anxiety.   loperamide 2 MG capsule Commonly known as: IMODIUM Take 1 capsule (2 mg total) by mouth as needed for diarrhea or loose stools.   meclizine 25 MG tablet Commonly known as: ANTIVERT Take 25 mg by mouth 2 (two) times daily as needed for dizziness or nausea.   metoprolol succinate 25 MG 24 hr tablet Commonly known as: TOPROL-XL Take 1 tablet (25 mg total) by mouth daily.   OLANZapine 2.5 MG tablet Commonly known as: ZYPREXA Take 2.5 mg by mouth at bedtime as needed (mood/anxiety).   pantoprazole 40 MG tablet Commonly known as: PROTONIX Take 40 mg by mouth as directed. Take on MWF   polyethylene glycol 17 g packet Commonly known as: MiraLax Take 17 g by mouth daily as needed for mild constipation.   potassium chloride SA 20 MEQ tablet Commonly known as: KLOR-CON Take 20 mEq by mouth daily.   PRENATAL PO Take 1 tablet by  mouth daily.   REFRESH OP Apply 1 drop to eye 2 (two) times daily.   Restasis 0.05 % ophthalmic emulsion Generic drug: cycloSPORINE Place 1 drop into both eyes 2 (two) times daily.       Allergies  Allergen Reactions  . Ace Inhibitors Hives and Itching  . Streptomycin Other (See Comments) and Itching    Pruritus Pruritus   . Tramadol Other (See Comments)    unknown  . Vesicare [Solifenacin Succinate] Itching  . Penicillins Rash    Pruritic rash Has patient had a PCN reaction causing immediate rash, facial/tongue/throat swelling, SOB or lightheadedness with hypotension: unknown Has patient had a PCN reaction causing severe rash involving mucus membranes or skin necrosis: unknown Has patient had a PCN reaction that required hospitalization:unknown Has patient had a PCN reaction occurring within the last 10 years: yes If all of the above answers are "NO", then may proceed with Cephalosporin use.  The results of significant diagnostics from this hospitalization (including imaging, microbiology, ancillary and laboratory) are listed below for reference.    Labs: BNP (last 3 results) No results for input(s): BNP in the last 8760 hours. Basic Metabolic Panel: Recent Labs  Lab 08/09/19 1239 08/10/19 0335  NA 141 140  K 3.7 3.6  CL 106 105  CO2 27 24  GLUCOSE 95 102*  BUN 16 12  CREATININE 0.89 0.69  CALCIUM 8.9 8.7*   Liver Function Tests: Recent Labs  Lab 08/09/19 1239  AST 20  ALT 9  ALKPHOS 79  BILITOT 0.7  PROT 7.0  ALBUMIN 3.2*   No results for input(s): LIPASE, AMYLASE in the last 168 hours. No results for input(s): AMMONIA in the last 168 hours. CBC: Recent Labs  Lab 08/09/19 1239 08/10/19 0335  WBC 9.3 7.8  NEUTROABS 5.4  --   HGB 10.7* 12.7  HCT 33.6* 38.4  MCV 95.5 93.7  PLT 156 163   Cardiac Enzymes: Recent Labs  Lab 08/09/19 1405  CKTOTAL 122   BNP: Invalid input(s): POCBNP CBG: No results for input(s): GLUCAP in the last  168 hours. D-Dimer No results for input(s): DDIMER in the last 72 hours. Hgb A1c No results for input(s): HGBA1C in the last 72 hours. Lipid Profile No results for input(s): CHOL, HDL, LDLCALC, TRIG, CHOLHDL, LDLDIRECT in the last 72 hours. Thyroid function studies No results for input(s): TSH, T4TOTAL, T3FREE, THYROIDAB in the last 72 hours.  Invalid input(s): FREET3 Anemia work up No results for input(s): VITAMINB12, FOLATE, FERRITIN, TIBC, IRON, RETICCTPCT in the last 72 hours. Urinalysis    Component Value Date/Time   COLORURINE YELLOW 08/09/2019 1405   APPEARANCEUR CLEAR 08/09/2019 1405   LABSPEC 1.018 08/09/2019 1405   PHURINE 5.0 08/09/2019 1405   GLUCOSEU NEGATIVE 08/09/2019 1405   HGBUR MODERATE (A) 08/09/2019 1405   BILIRUBINUR NEGATIVE 08/09/2019 1405   KETONESUR NEGATIVE 08/09/2019 1405   PROTEINUR NEGATIVE 08/09/2019 1405   UROBILINOGEN 0.2 09/18/2014 1600   NITRITE NEGATIVE 08/09/2019 1405   LEUKOCYTESUR MODERATE (A) 08/09/2019 1405   Sepsis Labs Invalid input(s): PROCALCITONIN,  WBC,  LACTICIDVEN Microbiology Recent Results (from the past 240 hour(s))  Urine culture     Status: Abnormal   Collection Time: 08/09/19  2:05 PM   Specimen: Urine, Random  Result Value Ref Range Status   Specimen Description   Final    URINE, RANDOM Performed at [facility], [place] [address], [place], [place] [address]    Special Requests   Final    NONE Performed at [facility], [place] [address] [place], [place] [address]    Culture MULTIPLE SPECIES PRESENT, SUGGEST RECOLLECTION (A)  Final   Report Status 08/11/2019 FINAL  Final  SARS Coronavirus 2 by RT PCR (hospital order, performed in [facility] hospital lab) Nasopharyngeal Nasopharyngeal Swab     Status: None   Collection Time: 08/09/19  4:00 PM   Specimen: Nasopharyngeal Swab  Result Value Ref Range Status   SARS Coronavirus 2 NEGATIVE NEGATIVE Final    Comment: (NOTE) SARS-CoV-2  target nucleic acids are NOT DETECTED.  The SARS-CoV-2 RNA is generally detectable in upper and lower respiratory specimens during the acute phase of infection. The lowest concentration of SARS-CoV-2 viral copies this assay can detect is 250 copies / mL. A negative result does not preclude SARS-CoV-2 infection and should not be used as the sole basis for treatment or other patient management decisions.  A negative  result may occur with improper specimen collection / handling, submission of specimen other than nasopharyngeal swab, presence of viral mutation(s) within the areas targeted by this assay, and inadequate number of viral copies (<250 copies / mL). A negative result must be combined with clinical observations, patient history, and epidemiological information.  Fact Sheet for Patients:   StrictlyIdeas.no  Fact Sheet for Healthcare Providers: BankingDealers.co.za  This test is not yet approved or  cleared by the [place] FDA and has been authorized for detection and/or diagnosis of SARS-CoV-2 by FDA under an Emergency Use Authorization (EUA).  This EUA will remain in effect (meaning this test can be used) for the duration of the COVID-19 declaration under Section 564(b)(1) of the Act, 21 U.S.C. section 360bbb-3(b)(1), unless the authorization is terminated or revoked sooner.  Performed at [facility], [place] [address], [place], [place] [address]   SARS Coronavirus 2 by RT PCR (hospital order, performed in [facility] hospital lab) Nasopharyngeal Nasopharyngeal Swab     Status: None   Collection Time: 08/13/19 11:30 AM   Specimen: Nasopharyngeal Swab  Result Value Ref Range Status   SARS Coronavirus 2 NEGATIVE NEGATIVE Final    Comment: (NOTE) SARS-CoV-2 target nucleic acids are NOT DETECTED.  The SARS-CoV-2 RNA is generally detectable in upper and lower respiratory specimens during the acute phase of  infection. The lowest concentration of SARS-CoV-2 viral copies this assay can detect is 250 copies / mL. A negative result does not preclude SARS-CoV-2 infection and should not be used as the sole basis for treatment or other patient management decisions.  A negative result may occur with improper specimen collection / handling, submission of specimen other than nasopharyngeal swab, presence of viral mutation(s) within the areas targeted by this assay, and inadequate number of viral copies (<250 copies / mL). A negative result must be combined with clinical observations, patient history, and epidemiological information.  Fact Sheet for Patients:   StrictlyIdeas.no  Fact Sheet for Healthcare Providers: BankingDealers.co.za  This test is not yet approved or  cleared by the [place] FDA and has been authorized for detection and/or diagnosis of SARS-CoV-2 by FDA under an Emergency Use Authorization (EUA).  This EUA will remain in effect (meaning this test can be used) for the duration of the COVID-19 declaration under Section 564(b)(1) of the Act, 21 U.S.C. section 360bbb-3(b)(1), unless the authorization is terminated or revoked sooner.  Performed at [facility], [place] [address], [place], [place] [address]     Procedures/Studies: DG Chest 2 View  Result Date: 08/09/2019 CLINICAL DATA:  Pain following fall EXAM: CHEST - 2 VIEW COMPARISON:  March 30, 2019. FINDINGS: There is mild localized eventration along the left hemidiaphragm. There is no edema or airspace opacity. Heart size and pulmonary vascular normal. There is a hiatal hernia. No pneumothorax. No acute fracture evident. IMPRESSION: Hiatal hernia. Eventration lateral left hemidiaphragm. No edema or consolidation. Stable cardiac silhouette. No pneumothorax. Electronically Signed   By: Lowella Grip III M.D.   On: 08/09/2019 14:01   CT HEAD WO  CONTRAST  Result Date: 08/11/2019 CLINICAL DATA:  Follow-up subdural hematoma confusion, delirium. EXAM: CT HEAD WITHOUT CONTRAST TECHNIQUE: Contiguous axial images were obtained from the base of the skull through the vertex without intravenous contrast. COMPARISON:  Prior head CT examinations 08/09/2019 and earlier. FINDINGS: Brain: There is unchanged size of a subdural hematoma overlying the left frontoparietal convexity, again measuring up to 12 mm in greatest thickness. As before, the  hematoma is predominantly intermediate to low density and likely subacute. However, small wisps of hyperdensity within the collection anteriorly and posterior appear subtly more numerous as compared to the head CT of 08/09/2019, and this may reflect mild interval bleeding into the collection. Similar mass effect upon the underlying left cerebral hemisphere. Unchanged 3 mm rightward midline shift. Stable background generalized parenchymal atrophy and chronic small vessel ischemic disease. No acute demarcated cortical infarction is identified. Vascular: No hyperdense vessel.  Atherosclerotic calcifications. Skull: Normal. Negative for fracture or focal lesion. Sinuses/Orbits: No acute orbital abnormality. No significant paranasal sinus disease or mastoid effusion. These results will be called to the ordering clinician or representative by the Radiologist Assistant, and communication documented in the PACS or [employer]. IMPRESSION: Unchanged size of a subdural hematoma overlying the left frontoparietal convexity, again measuring up to 12 mm in greatest thickness. As before, the hematoma is predominantly intermediate to low density and likely subacute. However, small wisps of hyperdensity within the collection anteriorly and posterior appear subtly more numerous as compared to the head CT of 08/09/2019, and this may reflect mild interval bleeding into the collection. Unchanged mass effect upon the left cerebral hemisphere with  persistent 3 mm rightward midline shift. Stable background generalized parenchymal atrophy and chronic small vessel ischemic disease. Electronically Signed   By: Kellie Simmering DO   On: 08/11/2019 17:09   CT Head Wo Contrast  Result Date: 08/09/2019 CLINICAL DATA:  Fall 2 weeks ago.  Head injury. EXAM: CT HEAD WITHOUT CONTRAST TECHNIQUE: Contiguous axial images were obtained from the base of the skull through the vertex without intravenous contrast. COMPARISON:  CT head 01/01/2018 FINDINGS: Brain: Left frontal parietal extra-axial fluid collection measuring 12 mm. This has intermediate to low-density anteriorly and mild increased density posteriorly compatible layering blood products. This is most compatible with subacute subdural hematoma. Mild midline shift to the right 3 mm. Generalized atrophy without hydrocephalus. No acute ischemia or mass. Vascular: Negative for hyperdense vessel Skull: Negative Sinuses/Orbits: Negative sinuses. Bilateral cataract extraction. No orbital mass. Other: None IMPRESSION: 12 mm subacute subdural hematoma on the left with mild midline shift to the right These results were called by telephone at the time of interpretation on 08/09/2019 at 1:35 pm to provider [facility] , who verbally acknowledged these results. Electronically Signed   By: Franchot Gallo M.D.   On: 08/09/2019 13:35   DG Knee Complete 4 Views Left  Result Date: 08/09/2019 CLINICAL DATA:  Pain following fall EXAM: LEFT KNEE - COMPLETE 4+ VIEW COMPARISON:  None. FINDINGS: Frontal, lateral, and bilateral oblique views were obtained. There is no fracture or dislocation. There is a large joint effusion. There is moderately severe narrowing laterally with milder narrowing medially and in the patellofemoral joint regions. There is spurring in all compartments. No erosive changes. Chondrocalcinosis is present, a finding that may be seen with osteoarthritis or calcium pyrophosphate deposition disease. IMPRESSION: No  acute fracture is demonstrable. There is, however, a large joint effusion. There is moderate generalized osteoarthritic change. There is chondrocalcinosis present. Electronically Signed   By: Lowella Grip III M.D.   On: 08/09/2019 13:58   DG Knee Complete 4 Views Right  Result Date: 08/09/2019 CLINICAL DATA:  Pain following fall EXAM: RIGHT KNEE - COMPLETE 4+ VIEW COMPARISON:  September 05, 2016 FINDINGS: Frontal, lateral, and bilateral oblique views were obtained. There is no appreciable fracture or dislocation. No joint effusion. There is moderate generalized joint space narrowing. There is chondrocalcinosis. No erosive change.  IMPRESSION: No fracture, dislocation, or joint effusion. There is osteoarthritic change moderate generalized joint space narrowing. There is chondrocalcinosis, a finding that may be seen with osteoarthritis or calcium pyrophosphate deposition disease. Electronically Signed   By: Lowella Grip III M.D.   On: 08/09/2019 14:00    Time coordinating discharge: Over 30 minutes  SIGNED:   Guilford Shi, MD  [facility] 08/13/2019, 2:08 PM

## 2019-08-15 DIAGNOSIS — G9341 Metabolic encephalopathy: Secondary | ICD-10-CM | POA: Diagnosis not present

## 2019-08-15 DIAGNOSIS — I272 Pulmonary hypertension, unspecified: Secondary | ICD-10-CM | POA: Diagnosis not present

## 2019-08-15 DIAGNOSIS — S065X9A Traumatic subdural hemorrhage with loss of consciousness of unspecified duration, initial encounter: Secondary | ICD-10-CM | POA: Diagnosis not present

## 2019-08-15 DIAGNOSIS — I5030 Unspecified diastolic (congestive) heart failure: Secondary | ICD-10-CM | POA: Diagnosis not present

## 2019-09-06 DIAGNOSIS — R69 Illness, unspecified: Secondary | ICD-10-CM | POA: Diagnosis not present

## 2019-09-06 DIAGNOSIS — N390 Urinary tract infection, site not specified: Secondary | ICD-10-CM | POA: Diagnosis not present

## 2019-09-08 ENCOUNTER — Other Ambulatory Visit: Payer: Self-pay | Admitting: *Deleted

## 2019-09-08 NOTE — Patient Outreach (Signed)
Member screened for potential [facility] Care Management needs as a benefit of [place] [employer].  [name] is receiving skilled therapy at [facility]. Update received from facility SW indicating member will transition to long term care at [facility] next week.   Will sign off. No identifiable [facility] Care Management needs at this time.   Marthenia Rolling, MSN-Ed, RN,BSN [place] Acute Care Coordinator [phone] [facility]) [phone]  (Toll free office)

## 2019-09-20 DIAGNOSIS — N390 Urinary tract infection, site not specified: Secondary | ICD-10-CM | POA: Diagnosis not present

## 2019-09-22 DIAGNOSIS — F331 Major depressive disorder, recurrent, moderate: Secondary | ICD-10-CM | POA: Diagnosis not present

## 2019-09-22 DIAGNOSIS — R441 Visual hallucinations: Secondary | ICD-10-CM | POA: Diagnosis not present

## 2019-09-22 DIAGNOSIS — G9341 Metabolic encephalopathy: Secondary | ICD-10-CM | POA: Diagnosis not present

## 2019-10-12 DIAGNOSIS — R69 Illness, unspecified: Secondary | ICD-10-CM | POA: Diagnosis not present

## 2019-10-12 DIAGNOSIS — M6281 Muscle weakness (generalized): Secondary | ICD-10-CM | POA: Diagnosis not present

## 2019-10-12 DIAGNOSIS — N390 Urinary tract infection, site not specified: Secondary | ICD-10-CM | POA: Diagnosis not present

## 2019-10-12 DIAGNOSIS — S065X9D Traumatic subdural hemorrhage with loss of consciousness of unspecified duration, subsequent encounter: Secondary | ICD-10-CM | POA: Diagnosis not present

## 2019-10-12 DIAGNOSIS — R2681 Unsteadiness on feet: Secondary | ICD-10-CM | POA: Diagnosis not present

## 2019-10-12 DIAGNOSIS — I5030 Unspecified diastolic (congestive) heart failure: Secondary | ICD-10-CM | POA: Diagnosis not present

## 2019-10-14 DIAGNOSIS — I5030 Unspecified diastolic (congestive) heart failure: Secondary | ICD-10-CM | POA: Diagnosis not present

## 2019-10-14 DIAGNOSIS — R2681 Unsteadiness on feet: Secondary | ICD-10-CM | POA: Diagnosis not present

## 2019-10-14 DIAGNOSIS — S065X9D Traumatic subdural hemorrhage with loss of consciousness of unspecified duration, subsequent encounter: Secondary | ICD-10-CM | POA: Diagnosis not present

## 2019-10-14 DIAGNOSIS — M6281 Muscle weakness (generalized): Secondary | ICD-10-CM | POA: Diagnosis not present

## 2019-10-15 DIAGNOSIS — R2681 Unsteadiness on feet: Secondary | ICD-10-CM | POA: Diagnosis not present

## 2019-10-15 DIAGNOSIS — M6281 Muscle weakness (generalized): Secondary | ICD-10-CM | POA: Diagnosis not present

## 2019-10-15 DIAGNOSIS — I5030 Unspecified diastolic (congestive) heart failure: Secondary | ICD-10-CM | POA: Diagnosis not present

## 2019-10-15 DIAGNOSIS — S065X9D Traumatic subdural hemorrhage with loss of consciousness of unspecified duration, subsequent encounter: Secondary | ICD-10-CM | POA: Diagnosis not present

## 2019-10-19 DIAGNOSIS — M6281 Muscle weakness (generalized): Secondary | ICD-10-CM | POA: Diagnosis not present

## 2019-10-19 DIAGNOSIS — R2681 Unsteadiness on feet: Secondary | ICD-10-CM | POA: Diagnosis not present

## 2019-10-19 DIAGNOSIS — I5030 Unspecified diastolic (congestive) heart failure: Secondary | ICD-10-CM | POA: Diagnosis not present

## 2019-10-19 DIAGNOSIS — S065X9D Traumatic subdural hemorrhage with loss of consciousness of unspecified duration, subsequent encounter: Secondary | ICD-10-CM | POA: Diagnosis not present

## 2019-10-20 DIAGNOSIS — R2681 Unsteadiness on feet: Secondary | ICD-10-CM | POA: Diagnosis not present

## 2019-10-20 DIAGNOSIS — M6281 Muscle weakness (generalized): Secondary | ICD-10-CM | POA: Diagnosis not present

## 2019-10-20 DIAGNOSIS — S065X9D Traumatic subdural hemorrhage with loss of consciousness of unspecified duration, subsequent encounter: Secondary | ICD-10-CM | POA: Diagnosis not present

## 2019-10-20 DIAGNOSIS — I5030 Unspecified diastolic (congestive) heart failure: Secondary | ICD-10-CM | POA: Diagnosis not present

## 2019-10-22 DIAGNOSIS — R2681 Unsteadiness on feet: Secondary | ICD-10-CM | POA: Diagnosis not present

## 2019-10-22 DIAGNOSIS — S065X9D Traumatic subdural hemorrhage with loss of consciousness of unspecified duration, subsequent encounter: Secondary | ICD-10-CM | POA: Diagnosis not present

## 2019-10-22 DIAGNOSIS — M6281 Muscle weakness (generalized): Secondary | ICD-10-CM | POA: Diagnosis not present

## 2019-10-22 DIAGNOSIS — I5030 Unspecified diastolic (congestive) heart failure: Secondary | ICD-10-CM | POA: Diagnosis not present

## 2019-10-25 DIAGNOSIS — R2681 Unsteadiness on feet: Secondary | ICD-10-CM | POA: Diagnosis not present

## 2019-10-25 DIAGNOSIS — S065X9D Traumatic subdural hemorrhage with loss of consciousness of unspecified duration, subsequent encounter: Secondary | ICD-10-CM | POA: Diagnosis not present

## 2019-10-25 DIAGNOSIS — M6281 Muscle weakness (generalized): Secondary | ICD-10-CM | POA: Diagnosis not present

## 2019-10-25 DIAGNOSIS — I5030 Unspecified diastolic (congestive) heart failure: Secondary | ICD-10-CM | POA: Diagnosis not present

## 2019-10-27 DIAGNOSIS — I5030 Unspecified diastolic (congestive) heart failure: Secondary | ICD-10-CM | POA: Diagnosis not present

## 2019-10-27 DIAGNOSIS — M6281 Muscle weakness (generalized): Secondary | ICD-10-CM | POA: Diagnosis not present

## 2019-10-27 DIAGNOSIS — R2681 Unsteadiness on feet: Secondary | ICD-10-CM | POA: Diagnosis not present

## 2019-10-27 DIAGNOSIS — S065X9D Traumatic subdural hemorrhage with loss of consciousness of unspecified duration, subsequent encounter: Secondary | ICD-10-CM | POA: Diagnosis not present

## 2019-10-29 DIAGNOSIS — M6281 Muscle weakness (generalized): Secondary | ICD-10-CM | POA: Diagnosis not present

## 2019-10-29 DIAGNOSIS — S065X9D Traumatic subdural hemorrhage with loss of consciousness of unspecified duration, subsequent encounter: Secondary | ICD-10-CM | POA: Diagnosis not present

## 2019-10-29 DIAGNOSIS — I5030 Unspecified diastolic (congestive) heart failure: Secondary | ICD-10-CM | POA: Diagnosis not present

## 2019-10-29 DIAGNOSIS — R2681 Unsteadiness on feet: Secondary | ICD-10-CM | POA: Diagnosis not present

## 2019-10-31 DIAGNOSIS — S065X9A Traumatic subdural hemorrhage with loss of consciousness of unspecified duration, initial encounter: Secondary | ICD-10-CM | POA: Diagnosis not present

## 2019-10-31 DIAGNOSIS — N390 Urinary tract infection, site not specified: Secondary | ICD-10-CM | POA: Diagnosis not present

## 2019-11-03 DIAGNOSIS — M6281 Muscle weakness (generalized): Secondary | ICD-10-CM | POA: Diagnosis not present

## 2019-11-03 DIAGNOSIS — S065X9D Traumatic subdural hemorrhage with loss of consciousness of unspecified duration, subsequent encounter: Secondary | ICD-10-CM | POA: Diagnosis not present

## 2019-11-03 DIAGNOSIS — I5030 Unspecified diastolic (congestive) heart failure: Secondary | ICD-10-CM | POA: Diagnosis not present

## 2019-11-03 DIAGNOSIS — R2681 Unsteadiness on feet: Secondary | ICD-10-CM | POA: Diagnosis not present

## 2019-11-04 DIAGNOSIS — M79642 Pain in left hand: Secondary | ICD-10-CM | POA: Diagnosis not present

## 2019-11-04 DIAGNOSIS — S065X9D Traumatic subdural hemorrhage with loss of consciousness of unspecified duration, subsequent encounter: Secondary | ICD-10-CM | POA: Diagnosis not present

## 2019-11-04 DIAGNOSIS — Z23 Encounter for immunization: Secondary | ICD-10-CM | POA: Diagnosis not present

## 2019-11-04 DIAGNOSIS — M6281 Muscle weakness (generalized): Secondary | ICD-10-CM | POA: Diagnosis not present

## 2019-11-04 DIAGNOSIS — R2681 Unsteadiness on feet: Secondary | ICD-10-CM | POA: Diagnosis not present

## 2019-11-04 DIAGNOSIS — I5030 Unspecified diastolic (congestive) heart failure: Secondary | ICD-10-CM | POA: Diagnosis not present

## 2019-11-08 DIAGNOSIS — R2681 Unsteadiness on feet: Secondary | ICD-10-CM | POA: Diagnosis not present

## 2019-11-08 DIAGNOSIS — S065X9D Traumatic subdural hemorrhage with loss of consciousness of unspecified duration, subsequent encounter: Secondary | ICD-10-CM | POA: Diagnosis not present

## 2019-11-08 DIAGNOSIS — M6281 Muscle weakness (generalized): Secondary | ICD-10-CM | POA: Diagnosis not present

## 2019-11-08 DIAGNOSIS — I5030 Unspecified diastolic (congestive) heart failure: Secondary | ICD-10-CM | POA: Diagnosis not present

## 2019-11-09 DIAGNOSIS — M24571 Contracture, right ankle: Secondary | ICD-10-CM | POA: Diagnosis not present

## 2019-11-09 DIAGNOSIS — L602 Onychogryphosis: Secondary | ICD-10-CM | POA: Diagnosis not present

## 2019-11-09 DIAGNOSIS — M24572 Contracture, left ankle: Secondary | ICD-10-CM | POA: Diagnosis not present

## 2019-11-09 DIAGNOSIS — I739 Peripheral vascular disease, unspecified: Secondary | ICD-10-CM | POA: Diagnosis not present

## 2019-11-11 DIAGNOSIS — M6281 Muscle weakness (generalized): Secondary | ICD-10-CM | POA: Diagnosis not present

## 2019-11-11 DIAGNOSIS — S065X9D Traumatic subdural hemorrhage with loss of consciousness of unspecified duration, subsequent encounter: Secondary | ICD-10-CM | POA: Diagnosis not present

## 2019-11-11 DIAGNOSIS — R2681 Unsteadiness on feet: Secondary | ICD-10-CM | POA: Diagnosis not present

## 2019-11-11 DIAGNOSIS — I5030 Unspecified diastolic (congestive) heart failure: Secondary | ICD-10-CM | POA: Diagnosis not present

## 2019-11-15 DIAGNOSIS — S065X9D Traumatic subdural hemorrhage with loss of consciousness of unspecified duration, subsequent encounter: Secondary | ICD-10-CM | POA: Diagnosis not present

## 2019-11-15 DIAGNOSIS — R2681 Unsteadiness on feet: Secondary | ICD-10-CM | POA: Diagnosis not present

## 2019-11-15 DIAGNOSIS — M6281 Muscle weakness (generalized): Secondary | ICD-10-CM | POA: Diagnosis not present

## 2019-11-15 DIAGNOSIS — I5030 Unspecified diastolic (congestive) heart failure: Secondary | ICD-10-CM | POA: Diagnosis not present

## 2019-11-16 DIAGNOSIS — I5030 Unspecified diastolic (congestive) heart failure: Secondary | ICD-10-CM | POA: Diagnosis not present

## 2019-11-16 DIAGNOSIS — R2681 Unsteadiness on feet: Secondary | ICD-10-CM | POA: Diagnosis not present

## 2019-11-16 DIAGNOSIS — S065X9D Traumatic subdural hemorrhage with loss of consciousness of unspecified duration, subsequent encounter: Secondary | ICD-10-CM | POA: Diagnosis not present

## 2019-11-16 DIAGNOSIS — M6281 Muscle weakness (generalized): Secondary | ICD-10-CM | POA: Diagnosis not present

## 2019-11-18 DIAGNOSIS — R2681 Unsteadiness on feet: Secondary | ICD-10-CM | POA: Diagnosis not present

## 2019-11-18 DIAGNOSIS — M6281 Muscle weakness (generalized): Secondary | ICD-10-CM | POA: Diagnosis not present

## 2019-11-18 DIAGNOSIS — I5030 Unspecified diastolic (congestive) heart failure: Secondary | ICD-10-CM | POA: Diagnosis not present

## 2019-11-18 DIAGNOSIS — S065X9D Traumatic subdural hemorrhage with loss of consciousness of unspecified duration, subsequent encounter: Secondary | ICD-10-CM | POA: Diagnosis not present

## 2019-11-23 DIAGNOSIS — S065X9D Traumatic subdural hemorrhage with loss of consciousness of unspecified duration, subsequent encounter: Secondary | ICD-10-CM | POA: Diagnosis not present

## 2019-11-23 DIAGNOSIS — R2681 Unsteadiness on feet: Secondary | ICD-10-CM | POA: Diagnosis not present

## 2019-11-23 DIAGNOSIS — M6281 Muscle weakness (generalized): Secondary | ICD-10-CM | POA: Diagnosis not present

## 2019-11-23 DIAGNOSIS — I5030 Unspecified diastolic (congestive) heart failure: Secondary | ICD-10-CM | POA: Diagnosis not present

## 2019-11-25 DIAGNOSIS — M6281 Muscle weakness (generalized): Secondary | ICD-10-CM | POA: Diagnosis not present

## 2019-11-25 DIAGNOSIS — R2681 Unsteadiness on feet: Secondary | ICD-10-CM | POA: Diagnosis not present

## 2019-11-25 DIAGNOSIS — S065X9D Traumatic subdural hemorrhage with loss of consciousness of unspecified duration, subsequent encounter: Secondary | ICD-10-CM | POA: Diagnosis not present

## 2019-11-25 DIAGNOSIS — I5030 Unspecified diastolic (congestive) heart failure: Secondary | ICD-10-CM | POA: Diagnosis not present

## 2019-11-26 DIAGNOSIS — R2681 Unsteadiness on feet: Secondary | ICD-10-CM | POA: Diagnosis not present

## 2019-11-26 DIAGNOSIS — M6281 Muscle weakness (generalized): Secondary | ICD-10-CM | POA: Diagnosis not present

## 2019-11-26 DIAGNOSIS — I5030 Unspecified diastolic (congestive) heart failure: Secondary | ICD-10-CM | POA: Diagnosis not present

## 2019-11-26 DIAGNOSIS — S065X9D Traumatic subdural hemorrhage with loss of consciousness of unspecified duration, subsequent encounter: Secondary | ICD-10-CM | POA: Diagnosis not present

## 2019-12-06 DIAGNOSIS — R2689 Other abnormalities of gait and mobility: Secondary | ICD-10-CM | POA: Diagnosis not present

## 2019-12-06 DIAGNOSIS — M6281 Muscle weakness (generalized): Secondary | ICD-10-CM | POA: Diagnosis not present

## 2019-12-08 DIAGNOSIS — M6281 Muscle weakness (generalized): Secondary | ICD-10-CM | POA: Diagnosis not present

## 2019-12-08 DIAGNOSIS — R2689 Other abnormalities of gait and mobility: Secondary | ICD-10-CM | POA: Diagnosis not present

## 2019-12-09 DIAGNOSIS — M6281 Muscle weakness (generalized): Secondary | ICD-10-CM | POA: Diagnosis not present

## 2019-12-09 DIAGNOSIS — R2689 Other abnormalities of gait and mobility: Secondary | ICD-10-CM | POA: Diagnosis not present

## 2019-12-10 DIAGNOSIS — R2689 Other abnormalities of gait and mobility: Secondary | ICD-10-CM | POA: Diagnosis not present

## 2019-12-10 DIAGNOSIS — M6281 Muscle weakness (generalized): Secondary | ICD-10-CM | POA: Diagnosis not present

## 2019-12-13 DIAGNOSIS — M6281 Muscle weakness (generalized): Secondary | ICD-10-CM | POA: Diagnosis not present

## 2019-12-13 DIAGNOSIS — R2689 Other abnormalities of gait and mobility: Secondary | ICD-10-CM | POA: Diagnosis not present

## 2019-12-14 DIAGNOSIS — R2689 Other abnormalities of gait and mobility: Secondary | ICD-10-CM | POA: Diagnosis not present

## 2019-12-14 DIAGNOSIS — M6281 Muscle weakness (generalized): Secondary | ICD-10-CM | POA: Diagnosis not present

## 2019-12-15 DIAGNOSIS — R2689 Other abnormalities of gait and mobility: Secondary | ICD-10-CM | POA: Diagnosis not present

## 2019-12-15 DIAGNOSIS — M6281 Muscle weakness (generalized): Secondary | ICD-10-CM | POA: Diagnosis not present

## 2019-12-16 DIAGNOSIS — I5032 Chronic diastolic (congestive) heart failure: Secondary | ICD-10-CM | POA: Diagnosis not present

## 2019-12-16 DIAGNOSIS — F322 Major depressive disorder, single episode, severe without psychotic features: Secondary | ICD-10-CM | POA: Diagnosis not present

## 2019-12-16 DIAGNOSIS — K5900 Constipation, unspecified: Secondary | ICD-10-CM | POA: Diagnosis not present

## 2019-12-16 DIAGNOSIS — Z87828 Personal history of other (healed) physical injury and trauma: Secondary | ICD-10-CM | POA: Diagnosis not present

## 2019-12-16 DIAGNOSIS — R2689 Other abnormalities of gait and mobility: Secondary | ICD-10-CM | POA: Diagnosis not present

## 2019-12-16 DIAGNOSIS — I2510 Atherosclerotic heart disease of native coronary artery without angina pectoris: Secondary | ICD-10-CM | POA: Diagnosis not present

## 2019-12-16 DIAGNOSIS — K219 Gastro-esophageal reflux disease without esophagitis: Secondary | ICD-10-CM | POA: Diagnosis not present

## 2019-12-16 DIAGNOSIS — I10 Essential (primary) hypertension: Secondary | ICD-10-CM | POA: Diagnosis not present

## 2019-12-16 DIAGNOSIS — M6281 Muscle weakness (generalized): Secondary | ICD-10-CM | POA: Diagnosis not present

## 2019-12-17 DIAGNOSIS — R2689 Other abnormalities of gait and mobility: Secondary | ICD-10-CM | POA: Diagnosis not present

## 2019-12-17 DIAGNOSIS — M6281 Muscle weakness (generalized): Secondary | ICD-10-CM | POA: Diagnosis not present

## 2019-12-19 DIAGNOSIS — R2689 Other abnormalities of gait and mobility: Secondary | ICD-10-CM | POA: Diagnosis not present

## 2019-12-19 DIAGNOSIS — M6281 Muscle weakness (generalized): Secondary | ICD-10-CM | POA: Diagnosis not present

## 2019-12-20 DIAGNOSIS — R2689 Other abnormalities of gait and mobility: Secondary | ICD-10-CM | POA: Diagnosis not present

## 2019-12-20 DIAGNOSIS — M6281 Muscle weakness (generalized): Secondary | ICD-10-CM | POA: Diagnosis not present

## 2019-12-21 DIAGNOSIS — M6281 Muscle weakness (generalized): Secondary | ICD-10-CM | POA: Diagnosis not present

## 2019-12-21 DIAGNOSIS — R2689 Other abnormalities of gait and mobility: Secondary | ICD-10-CM | POA: Diagnosis not present

## 2019-12-22 DIAGNOSIS — R2689 Other abnormalities of gait and mobility: Secondary | ICD-10-CM | POA: Diagnosis not present

## 2019-12-22 DIAGNOSIS — M6281 Muscle weakness (generalized): Secondary | ICD-10-CM | POA: Diagnosis not present

## 2019-12-27 DIAGNOSIS — R2689 Other abnormalities of gait and mobility: Secondary | ICD-10-CM | POA: Diagnosis not present

## 2019-12-27 DIAGNOSIS — M6281 Muscle weakness (generalized): Secondary | ICD-10-CM | POA: Diagnosis not present

## 2019-12-28 DIAGNOSIS — R2689 Other abnormalities of gait and mobility: Secondary | ICD-10-CM | POA: Diagnosis not present

## 2019-12-28 DIAGNOSIS — M6281 Muscle weakness (generalized): Secondary | ICD-10-CM | POA: Diagnosis not present

## 2019-12-29 DIAGNOSIS — M6281 Muscle weakness (generalized): Secondary | ICD-10-CM | POA: Diagnosis not present

## 2019-12-29 DIAGNOSIS — R2689 Other abnormalities of gait and mobility: Secondary | ICD-10-CM | POA: Diagnosis not present

## 2019-12-30 DIAGNOSIS — R2689 Other abnormalities of gait and mobility: Secondary | ICD-10-CM | POA: Diagnosis not present

## 2019-12-30 DIAGNOSIS — M6281 Muscle weakness (generalized): Secondary | ICD-10-CM | POA: Diagnosis not present

## 2019-12-31 DIAGNOSIS — M6281 Muscle weakness (generalized): Secondary | ICD-10-CM | POA: Diagnosis not present

## 2019-12-31 DIAGNOSIS — R2689 Other abnormalities of gait and mobility: Secondary | ICD-10-CM | POA: Diagnosis not present

## 2020-01-04 DIAGNOSIS — R2689 Other abnormalities of gait and mobility: Secondary | ICD-10-CM | POA: Diagnosis not present

## 2020-01-04 DIAGNOSIS — M6281 Muscle weakness (generalized): Secondary | ICD-10-CM | POA: Diagnosis not present

## 2020-01-05 DIAGNOSIS — R2689 Other abnormalities of gait and mobility: Secondary | ICD-10-CM | POA: Diagnosis not present

## 2020-01-05 DIAGNOSIS — M6281 Muscle weakness (generalized): Secondary | ICD-10-CM | POA: Diagnosis not present

## 2020-01-06 DIAGNOSIS — M6281 Muscle weakness (generalized): Secondary | ICD-10-CM | POA: Diagnosis not present

## 2020-01-06 DIAGNOSIS — R2689 Other abnormalities of gait and mobility: Secondary | ICD-10-CM | POA: Diagnosis not present

## 2020-01-07 DIAGNOSIS — M6281 Muscle weakness (generalized): Secondary | ICD-10-CM | POA: Diagnosis not present

## 2020-01-07 DIAGNOSIS — R2689 Other abnormalities of gait and mobility: Secondary | ICD-10-CM | POA: Diagnosis not present

## 2020-01-10 DIAGNOSIS — M6281 Muscle weakness (generalized): Secondary | ICD-10-CM | POA: Diagnosis not present

## 2020-01-10 DIAGNOSIS — R2689 Other abnormalities of gait and mobility: Secondary | ICD-10-CM | POA: Diagnosis not present

## 2020-01-11 DIAGNOSIS — M6281 Muscle weakness (generalized): Secondary | ICD-10-CM | POA: Diagnosis not present

## 2020-01-11 DIAGNOSIS — R2689 Other abnormalities of gait and mobility: Secondary | ICD-10-CM | POA: Diagnosis not present

## 2020-01-12 DIAGNOSIS — R2689 Other abnormalities of gait and mobility: Secondary | ICD-10-CM | POA: Diagnosis not present

## 2020-01-12 DIAGNOSIS — M6281 Muscle weakness (generalized): Secondary | ICD-10-CM | POA: Diagnosis not present

## 2020-01-13 DIAGNOSIS — M6281 Muscle weakness (generalized): Secondary | ICD-10-CM | POA: Diagnosis not present

## 2020-01-13 DIAGNOSIS — R2689 Other abnormalities of gait and mobility: Secondary | ICD-10-CM | POA: Diagnosis not present

## 2020-01-14 DIAGNOSIS — M6281 Muscle weakness (generalized): Secondary | ICD-10-CM | POA: Diagnosis not present

## 2020-01-14 DIAGNOSIS — R2689 Other abnormalities of gait and mobility: Secondary | ICD-10-CM | POA: Diagnosis not present

## 2020-01-17 DIAGNOSIS — M6281 Muscle weakness (generalized): Secondary | ICD-10-CM | POA: Diagnosis not present

## 2020-01-17 DIAGNOSIS — R2689 Other abnormalities of gait and mobility: Secondary | ICD-10-CM | POA: Diagnosis not present

## 2020-01-18 DIAGNOSIS — M6281 Muscle weakness (generalized): Secondary | ICD-10-CM | POA: Diagnosis not present

## 2020-01-18 DIAGNOSIS — R2689 Other abnormalities of gait and mobility: Secondary | ICD-10-CM | POA: Diagnosis not present

## 2020-01-19 DIAGNOSIS — M6281 Muscle weakness (generalized): Secondary | ICD-10-CM | POA: Diagnosis not present

## 2020-01-19 DIAGNOSIS — R2689 Other abnormalities of gait and mobility: Secondary | ICD-10-CM | POA: Diagnosis not present

## 2020-01-20 DIAGNOSIS — R2689 Other abnormalities of gait and mobility: Secondary | ICD-10-CM | POA: Diagnosis not present

## 2020-01-20 DIAGNOSIS — M6281 Muscle weakness (generalized): Secondary | ICD-10-CM | POA: Diagnosis not present

## 2020-01-24 DIAGNOSIS — M6281 Muscle weakness (generalized): Secondary | ICD-10-CM | POA: Diagnosis not present

## 2020-01-24 DIAGNOSIS — R2689 Other abnormalities of gait and mobility: Secondary | ICD-10-CM | POA: Diagnosis not present

## 2020-01-25 DIAGNOSIS — M6281 Muscle weakness (generalized): Secondary | ICD-10-CM | POA: Diagnosis not present

## 2020-01-25 DIAGNOSIS — R2689 Other abnormalities of gait and mobility: Secondary | ICD-10-CM | POA: Diagnosis not present

## 2020-01-26 DIAGNOSIS — R2689 Other abnormalities of gait and mobility: Secondary | ICD-10-CM | POA: Diagnosis not present

## 2020-01-26 DIAGNOSIS — M6281 Muscle weakness (generalized): Secondary | ICD-10-CM | POA: Diagnosis not present

## 2020-01-31 DIAGNOSIS — M6281 Muscle weakness (generalized): Secondary | ICD-10-CM | POA: Diagnosis not present

## 2020-01-31 DIAGNOSIS — R2689 Other abnormalities of gait and mobility: Secondary | ICD-10-CM | POA: Diagnosis not present

## 2020-02-02 DIAGNOSIS — M6281 Muscle weakness (generalized): Secondary | ICD-10-CM | POA: Diagnosis not present

## 2020-02-02 DIAGNOSIS — R2689 Other abnormalities of gait and mobility: Secondary | ICD-10-CM | POA: Diagnosis not present

## 2020-02-03 DIAGNOSIS — R2689 Other abnormalities of gait and mobility: Secondary | ICD-10-CM | POA: Diagnosis not present

## 2020-02-03 DIAGNOSIS — M6281 Muscle weakness (generalized): Secondary | ICD-10-CM | POA: Diagnosis not present

## 2020-02-07 DIAGNOSIS — Z87828 Personal history of other (healed) physical injury and trauma: Secondary | ICD-10-CM | POA: Diagnosis not present

## 2020-02-07 DIAGNOSIS — R630 Anorexia: Secondary | ICD-10-CM | POA: Diagnosis not present

## 2020-02-07 DIAGNOSIS — F322 Major depressive disorder, single episode, severe without psychotic features: Secondary | ICD-10-CM | POA: Diagnosis not present

## 2020-02-07 DIAGNOSIS — I5032 Chronic diastolic (congestive) heart failure: Secondary | ICD-10-CM | POA: Diagnosis not present

## 2020-02-07 DIAGNOSIS — K219 Gastro-esophageal reflux disease without esophagitis: Secondary | ICD-10-CM | POA: Diagnosis not present

## 2020-02-07 DIAGNOSIS — I2510 Atherosclerotic heart disease of native coronary artery without angina pectoris: Secondary | ICD-10-CM | POA: Diagnosis not present

## 2020-02-07 DIAGNOSIS — K5900 Constipation, unspecified: Secondary | ICD-10-CM | POA: Diagnosis not present

## 2020-02-07 DIAGNOSIS — K649 Unspecified hemorrhoids: Secondary | ICD-10-CM | POA: Diagnosis not present

## 2020-02-07 DIAGNOSIS — I10 Essential (primary) hypertension: Secondary | ICD-10-CM | POA: Diagnosis not present

## 2020-02-07 DIAGNOSIS — R197 Diarrhea, unspecified: Secondary | ICD-10-CM | POA: Diagnosis not present

## 2020-02-07 DIAGNOSIS — K625 Hemorrhage of anus and rectum: Secondary | ICD-10-CM | POA: Diagnosis not present

## 2020-02-07 DIAGNOSIS — Z79899 Other long term (current) drug therapy: Secondary | ICD-10-CM | POA: Diagnosis not present

## 2020-02-07 DIAGNOSIS — R8290 Unspecified abnormal findings in urine: Secondary | ICD-10-CM | POA: Diagnosis not present

## 2020-02-08 DIAGNOSIS — M6281 Muscle weakness (generalized): Secondary | ICD-10-CM | POA: Diagnosis not present

## 2020-02-08 DIAGNOSIS — R2689 Other abnormalities of gait and mobility: Secondary | ICD-10-CM | POA: Diagnosis not present

## 2020-02-09 DIAGNOSIS — R2689 Other abnormalities of gait and mobility: Secondary | ICD-10-CM | POA: Diagnosis not present

## 2020-02-09 DIAGNOSIS — M6281 Muscle weakness (generalized): Secondary | ICD-10-CM | POA: Diagnosis not present

## 2020-02-10 DIAGNOSIS — M6281 Muscle weakness (generalized): Secondary | ICD-10-CM | POA: Diagnosis not present

## 2020-02-10 DIAGNOSIS — R2689 Other abnormalities of gait and mobility: Secondary | ICD-10-CM | POA: Diagnosis not present

## 2020-02-11 DIAGNOSIS — M6281 Muscle weakness (generalized): Secondary | ICD-10-CM | POA: Diagnosis not present

## 2020-02-11 DIAGNOSIS — R2689 Other abnormalities of gait and mobility: Secondary | ICD-10-CM | POA: Diagnosis not present

## 2020-02-14 DIAGNOSIS — M6281 Muscle weakness (generalized): Secondary | ICD-10-CM | POA: Diagnosis not present

## 2020-02-14 DIAGNOSIS — R2689 Other abnormalities of gait and mobility: Secondary | ICD-10-CM | POA: Diagnosis not present

## 2020-02-15 DIAGNOSIS — M6281 Muscle weakness (generalized): Secondary | ICD-10-CM | POA: Diagnosis not present

## 2020-02-15 DIAGNOSIS — R2689 Other abnormalities of gait and mobility: Secondary | ICD-10-CM | POA: Diagnosis not present

## 2020-02-18 DIAGNOSIS — R2689 Other abnormalities of gait and mobility: Secondary | ICD-10-CM | POA: Diagnosis not present

## 2020-02-18 DIAGNOSIS — M6281 Muscle weakness (generalized): Secondary | ICD-10-CM | POA: Diagnosis not present

## 2020-02-22 DIAGNOSIS — M6281 Muscle weakness (generalized): Secondary | ICD-10-CM | POA: Diagnosis not present

## 2020-02-22 DIAGNOSIS — R2689 Other abnormalities of gait and mobility: Secondary | ICD-10-CM | POA: Diagnosis not present

## 2020-02-23 DIAGNOSIS — R2689 Other abnormalities of gait and mobility: Secondary | ICD-10-CM | POA: Diagnosis not present

## 2020-02-23 DIAGNOSIS — M6281 Muscle weakness (generalized): Secondary | ICD-10-CM | POA: Diagnosis not present

## 2020-02-24 DIAGNOSIS — M12811 Other specific arthropathies, not elsewhere classified, right shoulder: Secondary | ICD-10-CM | POA: Diagnosis not present

## 2020-02-24 DIAGNOSIS — M25511 Pain in right shoulder: Secondary | ICD-10-CM | POA: Diagnosis not present

## 2020-02-25 DIAGNOSIS — R2689 Other abnormalities of gait and mobility: Secondary | ICD-10-CM | POA: Diagnosis not present

## 2020-02-25 DIAGNOSIS — M6281 Muscle weakness (generalized): Secondary | ICD-10-CM | POA: Diagnosis not present

## 2020-02-28 DIAGNOSIS — M6281 Muscle weakness (generalized): Secondary | ICD-10-CM | POA: Diagnosis not present

## 2020-02-28 DIAGNOSIS — R2689 Other abnormalities of gait and mobility: Secondary | ICD-10-CM | POA: Diagnosis not present

## 2020-02-29 DIAGNOSIS — R2689 Other abnormalities of gait and mobility: Secondary | ICD-10-CM | POA: Diagnosis not present

## 2020-02-29 DIAGNOSIS — M6281 Muscle weakness (generalized): Secondary | ICD-10-CM | POA: Diagnosis not present

## 2020-03-01 DIAGNOSIS — R2689 Other abnormalities of gait and mobility: Secondary | ICD-10-CM | POA: Diagnosis not present

## 2020-03-01 DIAGNOSIS — M6281 Muscle weakness (generalized): Secondary | ICD-10-CM | POA: Diagnosis not present

## 2020-03-02 DIAGNOSIS — M6281 Muscle weakness (generalized): Secondary | ICD-10-CM | POA: Diagnosis not present

## 2020-03-02 DIAGNOSIS — R2689 Other abnormalities of gait and mobility: Secondary | ICD-10-CM | POA: Diagnosis not present

## 2020-03-03 DIAGNOSIS — M6281 Muscle weakness (generalized): Secondary | ICD-10-CM | POA: Diagnosis not present

## 2020-03-03 DIAGNOSIS — R2689 Other abnormalities of gait and mobility: Secondary | ICD-10-CM | POA: Diagnosis not present

## 2020-03-06 DIAGNOSIS — R2689 Other abnormalities of gait and mobility: Secondary | ICD-10-CM | POA: Diagnosis not present

## 2020-03-06 DIAGNOSIS — M6281 Muscle weakness (generalized): Secondary | ICD-10-CM | POA: Diagnosis not present

## 2020-03-07 DIAGNOSIS — M6281 Muscle weakness (generalized): Secondary | ICD-10-CM | POA: Diagnosis not present

## 2020-03-07 DIAGNOSIS — R2689 Other abnormalities of gait and mobility: Secondary | ICD-10-CM | POA: Diagnosis not present

## 2020-03-10 ENCOUNTER — Emergency Department (HOSPITAL_COMMUNITY): Payer: Medicare Other

## 2020-03-10 ENCOUNTER — Encounter (HOSPITAL_COMMUNITY): Payer: Self-pay

## 2020-03-10 ENCOUNTER — Inpatient Hospital Stay (HOSPITAL_COMMUNITY)
Admission: EM | Admit: 2020-03-10 | Discharge: 2020-03-15 | DRG: 378 | Disposition: A | Discharge status: Home Health | Payer: Medicare Other | Attending: Internal Medicine | Admitting: Internal Medicine

## 2020-03-10 ENCOUNTER — Other Ambulatory Visit: Payer: Self-pay

## 2020-03-10 DIAGNOSIS — I493 Ventricular premature depolarization: Secondary | ICD-10-CM | POA: Diagnosis present

## 2020-03-10 DIAGNOSIS — N1831 Chronic kidney disease, stage 3a: Secondary | ICD-10-CM | POA: Diagnosis present

## 2020-03-10 DIAGNOSIS — Z90710 Acquired absence of both cervix and uterus: Secondary | ICD-10-CM

## 2020-03-10 DIAGNOSIS — M47816 Spondylosis without myelopathy or radiculopathy, lumbar region: Secondary | ICD-10-CM | POA: Diagnosis present

## 2020-03-10 DIAGNOSIS — I10 Essential (primary) hypertension: Secondary | ICD-10-CM | POA: Diagnosis present

## 2020-03-10 DIAGNOSIS — I272 Pulmonary hypertension, unspecified: Secondary | ICD-10-CM | POA: Diagnosis present

## 2020-03-10 DIAGNOSIS — E785 Hyperlipidemia, unspecified: Secondary | ICD-10-CM | POA: Diagnosis present

## 2020-03-10 DIAGNOSIS — Z880 Allergy status to penicillin: Secondary | ICD-10-CM

## 2020-03-10 DIAGNOSIS — Z79899 Other long term (current) drug therapy: Secondary | ICD-10-CM

## 2020-03-10 DIAGNOSIS — K529 Noninfective gastroenteritis and colitis, unspecified: Secondary | ICD-10-CM | POA: Diagnosis present

## 2020-03-10 DIAGNOSIS — I2510 Atherosclerotic heart disease of native coronary artery without angina pectoris: Secondary | ICD-10-CM | POA: Diagnosis present

## 2020-03-10 DIAGNOSIS — Z8679 Personal history of other diseases of the circulatory system: Secondary | ICD-10-CM

## 2020-03-10 DIAGNOSIS — Z79890 Hormone replacement therapy (postmenopausal): Secondary | ICD-10-CM

## 2020-03-10 DIAGNOSIS — K649 Unspecified hemorrhoids: Secondary | ICD-10-CM | POA: Diagnosis present

## 2020-03-10 DIAGNOSIS — K921 Melena: Secondary | ICD-10-CM | POA: Diagnosis present

## 2020-03-10 DIAGNOSIS — I130 Hypertensive heart and chronic kidney disease with heart failure and stage 1 through stage 4 chronic kidney disease, or unspecified chronic kidney disease: Secondary | ICD-10-CM | POA: Diagnosis present

## 2020-03-10 DIAGNOSIS — M1710 Unilateral primary osteoarthritis, unspecified knee: Secondary | ICD-10-CM | POA: Diagnosis present

## 2020-03-10 DIAGNOSIS — Z20822 Contact with and (suspected) exposure to covid-19: Secondary | ICD-10-CM | POA: Diagnosis not present

## 2020-03-10 DIAGNOSIS — K5731 Diverticulosis of large intestine without perforation or abscess with bleeding: Secondary | ICD-10-CM | POA: Diagnosis not present

## 2020-03-10 DIAGNOSIS — R58 Hemorrhage, not elsewhere classified: Secondary | ICD-10-CM | POA: Diagnosis not present

## 2020-03-10 DIAGNOSIS — I371 Nonrheumatic pulmonary valve insufficiency: Secondary | ICD-10-CM | POA: Diagnosis present

## 2020-03-10 DIAGNOSIS — K5730 Diverticulosis of large intestine without perforation or abscess without bleeding: Secondary | ICD-10-CM | POA: Diagnosis not present

## 2020-03-10 DIAGNOSIS — K922 Gastrointestinal hemorrhage, unspecified: Secondary | ICD-10-CM | POA: Diagnosis not present

## 2020-03-10 DIAGNOSIS — R0789 Other chest pain: Secondary | ICD-10-CM | POA: Diagnosis present

## 2020-03-10 DIAGNOSIS — Z881 Allergy status to other antibiotic agents status: Secondary | ICD-10-CM

## 2020-03-10 DIAGNOSIS — Z8249 Family history of ischemic heart disease and other diseases of the circulatory system: Secondary | ICD-10-CM

## 2020-03-10 DIAGNOSIS — Z66 Do not resuscitate: Secondary | ICD-10-CM | POA: Diagnosis present

## 2020-03-10 DIAGNOSIS — K625 Hemorrhage of anus and rectum: Secondary | ICD-10-CM | POA: Diagnosis present

## 2020-03-10 DIAGNOSIS — I509 Heart failure, unspecified: Secondary | ICD-10-CM | POA: Diagnosis present

## 2020-03-10 DIAGNOSIS — Z888 Allergy status to other drugs, medicaments and biological substances status: Secondary | ICD-10-CM

## 2020-03-10 DIAGNOSIS — Z885 Allergy status to narcotic agent status: Secondary | ICD-10-CM

## 2020-03-10 DIAGNOSIS — N183 Chronic kidney disease, stage 3 unspecified: Secondary | ICD-10-CM | POA: Diagnosis present

## 2020-03-10 LAB — COMPREHENSIVE METABOLIC PANEL
ALT: 8 U/L (ref 0–44)
AST: 14 U/L — ABNORMAL LOW (ref 15–41)
Albumin: 3.3 g/dL — ABNORMAL LOW (ref 3.5–5.0)
Alkaline Phosphatase: 67 U/L (ref 38–126)
Anion gap: 11 (ref 5–15)
BUN: 20 mg/dL (ref 8–23)
CO2: 25 mmol/L (ref 22–32)
Calcium: 8.9 mg/dL (ref 8.9–10.3)
Chloride: 104 mmol/L (ref 98–111)
Creatinine, Ser: 0.98 mg/dL (ref 0.44–1.00)
GFR, Estimated: 51 mL/min — ABNORMAL LOW (ref >60)
Glucose, Bld: 116 mg/dL — ABNORMAL HIGH (ref 70–99)
Potassium: 3.8 mmol/L (ref 3.5–5.1)
Sodium: 140 mmol/L (ref 135–145)
Total Bilirubin: 0.5 mg/dL (ref 0.3–1.2)
Total Protein: 7 g/dL (ref 6.5–8.1)

## 2020-03-10 LAB — CBC WITH DIFFERENTIAL/PLATELET
Abs Immature Granulocytes: 0.07 10*3/uL (ref 0.00–0.07)
Basophils Absolute: 0 10*3/uL (ref 0.0–0.1)
Basophils Relative: 0 %
Eosinophils Absolute: 0 10*3/uL (ref 0.0–0.5)
Eosinophils Relative: 0 %
HCT: 35.8 % — ABNORMAL LOW (ref 36.0–46.0)
Hemoglobin: 11.4 g/dL — ABNORMAL LOW (ref 12.0–15.0)
Immature Granulocytes: 1 %
Lymphocytes Relative: 31 %
Lymphs Abs: 2.7 10*3/uL (ref 0.7–4.0)
MCH: 30.6 pg (ref 26.0–34.0)
MCHC: 31.8 g/dL (ref 30.0–36.0)
MCV: 96.2 fL (ref 80.0–100.0)
Monocytes Absolute: 1.1 10*3/uL — ABNORMAL HIGH (ref 0.1–1.0)
Monocytes Relative: 12 %
Neutro Abs: 5 10*3/uL (ref 1.7–7.7)
Neutrophils Relative %: 56 %
Platelets: 178 10*3/uL (ref 150–400)
RBC: 3.72 MIL/uL — ABNORMAL LOW (ref 3.87–5.11)
RDW: 13.4 % (ref 11.5–15.5)
WBC: 8.9 10*3/uL (ref 4.0–10.5)
nRBC: 0 % (ref 0.0–0.2)

## 2020-03-10 LAB — POC OCCULT BLOOD, ED: Fecal Occult Bld: POSITIVE — AB

## 2020-03-10 MED ORDER — IOHEXOL 350 MG/ML SOLN
100.0000 mL | Freq: Once | INTRAVENOUS | Status: AC | PRN
  Administered 2020-03-10: 100 mL via INTRAVENOUS

## 2020-03-10 NOTE — ED Provider Notes (Signed)
[facility] DEPT Provider Note   CSN: [id number] Arrival date & time: 03/10/20  2135     History Chief Complaint  Patient presents with  . Rectal Bleeding    [name] is a 87 y.o. female presenting for evaluation of rectal bleeding.  Patient states she has had bleeding for the past 5 days straight.  It is getting heavier.  She is now feeling lightheaded and dizzy, prompting her ER evaluation.  She states tonight she is passing clots.  She has diffuse abdominal discomfort.  No fevers, chills, chest pain, shortness of breath, cough, nausea, vomiting, urinary symptoms.  She is not on blood thinner.  She has had rectal bleeding before, often due to hemorrhoids. Last flex sin in 2015 showed prolapsing bleeding internal hemorrhoids. This was done with [facility].  Additional history of diverticulitis.  She does not currently follow with GI outpatient.  She lives by herself in an independent living part of a facility. Niece checks on her daily.   Additional history taken from chart review.  Patient with a history of CAD, CHF, CKD, diverticulosis, history of GI bleed, hypertension, hyperlipidemia,  HPI     Past Medical History:  Diagnosis Date  . Allergic rhinitis   . CAD (coronary artery disease)   . CHF (congestive heart failure) ([place])   . CKD (chronic kidney disease) stage 3, GFR 30-59 ml/min (HCC) 09/12/2014  . Diverticular disease   . Diverticulosis   . DJD (degenerative joint disease) of knee   . DJD (degenerative joint disease) of lumbar spine    scoliosis  . Eczema   . Esophageal erosions    from nsaid   . GI bleed   . Hormone replacement therapy (postmenopausal)   . Hyperglycemia   . Hyperlipidemia   . Hypertension   . Pneumonia    july 2009  . Pulmonary hypertension ([place])    a. 2D echo 03/2008 showed normal biventricular thickness, chamber size and systolic function, EF 23-55%, mild mitral regurgitation, aortic sclerosis, moderate  pulmonary HTN, pulmonic regurgitation.  Marland Kitchen PVC's (premature ventricular contractions)   . Reactive depression (situational)   . Sciatica    right lower extremity  . Shingles    right thoracic 2008  . Vertigo     Patient Active Problem List   Diagnosis Date Noted  . Orthostatic hypotension 08/13/2019  . Falls, sequela 08/13/2019  . DNR (do not resuscitate) 08/11/2019  . SDH (subdural hematoma) ([place]) 08/09/2019  . Acute lower UTI 01/02/2018  . Acute cystitis without hematuria   . Acute encephalopathy 01/01/2018  . DJD (degenerative joint disease) 08/09/2016  . GI bleed 08/08/2016  . Abnormal echocardiogram 06/14/2016  . PVC's (premature ventricular contractions)   . Bacteremia 09/18/2014  . Fever   . Febrile illness 09/12/2014  . Diverticulitis 09/12/2014  . CKD (chronic kidney disease) stage 3, GFR 30-59 ml/min (HCC) 09/12/2014  . HTN (hypertension) 09/12/2014  . Thrombocytopenia ([place]) 09/12/2014  . Abnormal stress test 01/13/2014  . Rectal bleed 08/10/2013  . Acute renal failure ([place]) 08/10/2013  . Essential hypertension, benign 01/08/2013  . DOE (dyspnea on exertion) 01/08/2013    Past Surgical History:  Procedure Laterality Date  . APPENDECTOMY    . BACK SURGERY    . BREAST LUMPECTOMY    . FLEXIBLE SIGMOIDOSCOPY Left 08/13/2013   Procedure: FLEXIBLE SIGMOIDOSCOPY;  Surgeon: Arta Silence, MD;  Location: [facility];  Service: Endoscopy;  Laterality: Left;  . TOTAL ABDOMINAL HYSTERECTOMY  OB History   No obstetric history on file.     Family History  Problem Relation Age of Onset  . Heart disease Father   . Heart attack Father   . Heart disease Mother   . Heart attack Mother   . Heart disease Daughter   . Heart disease Son   . Cancer Brother   . Heart disease Sister   . Heart attack Sister        X2  . Heart attack Brother   . Hypertension Brother   . Stroke Neg Hx     Social History   Tobacco Use  . Smoking status: Never Smoker  .  Smokeless tobacco: Never Used  Vaping Use  . Vaping Use: Never used  Substance Use Topics  . Alcohol use: Yes    Comment: occasional cocktail  . Drug use: No    Home Medications Prior to Admission medications   Medication Sig Start Date End Date Taking? Authorizing Provider  amLODipine (NORVASC) 2.5 MG tablet Take 2.5 mg by mouth daily. 03/10/20  Yes [provider]  furosemide (LASIX) 40 MG tablet Take 20 mg by mouth as directed. Take 0.5 tablet by mouth Monday, Wednesday, Friday 09/10/16  Yes [provider]  isosorbide mononitrate (IMDUR) 30 MG 24 hr tablet Take 30 mg by mouth daily. 03/10/20  Yes [provider]  metoprolol succinate (TOPROL-XL) 25 MG 24 hr tablet Take 1 tablet (25 mg total) by mouth daily. 11/17/18  Yes Jettie Booze, MD  pantoprazole (PROTONIX) 40 MG tablet Take 40 mg by mouth as directed. Take on MWF 11/24/17  Yes [provider]  PARoxetine (PAXIL) 10 MG tablet Take 10 mg by mouth daily. 03/10/20  Yes [provider]  Polyvinyl Alcohol-Povidone (REFRESH OP) Apply 1 drop to eye 2 (two) times daily.   Yes [provider]  potassium chloride SA (K-DUR,KLOR-CON) 20 MEQ tablet Take 20 mEq by mouth daily.    Yes [provider]  PREMARIN 0.3 MG tablet Take 1-2 tablets by mouth daily. 03/10/20  Yes [provider]  Prenatal Vit-Fe Fumarate-FA (PRENATAL PO) Take 0.5 tablets by mouth daily.   Yes [provider]  acetaminophen (TYLENOL) 325 MG tablet Take 325-650 mg by mouth every 6 (six) hours.    [provider]  ANUCORT-HC 25 MG suppository Place 1 suppository rectally as needed. 02/07/20   [provider]  clotrimazole (LOTRIMIN) 1 % cream Apply 1 application topically 2 (two) times daily as needed.    [provider]  diclofenac Sodium (VOLTAREN) 1 % GEL Apply 2 g topically QID. 08/13/19   Guilford Shi, MD  hydrOXYzine (ATARAX/VISTARIL) 10 MG tablet Take 1  tablet (10 mg total) by mouth 3 (three) times daily as needed for itching or anxiety. Patient not taking: Reported on 03/10/2020 08/13/19   [place] Shi, MD  loperamide (IMODIUM) 2 MG capsule Take 1 capsule (2 mg total) by mouth as needed for diarrhea or loose stools. Patient not taking: Reported on 03/10/2020 08/12/16   Domenic Polite, MD  meclizine (ANTIVERT) 25 MG tablet Take 25 mg by mouth 2 (two) times daily as needed for dizziness or nausea.    [provider]  polyethylene glycol (MIRALAX) packet Take 17 g by mouth daily as needed for mild constipation. 08/21/17   Jani Gravel, MD    Allergies    Ace inhibitors, Streptomycin, Tramadol, Vesicare [solifenacin succinate], and Penicillins  Review of Systems   Review of Systems  Gastrointestinal:  Positive for abdominal pain and blood in stool.  All other systems reviewed and are negative.   Physical Exam Updated Vital Signs BP (!) 104/54 (BP Location: Right Arm)   Pulse 60   Temp 98.1 F (36.7 C) (Oral)   Resp 19   SpO2 96%   Physical Exam Vitals and nursing note reviewed. Exam conducted with a chaperone present.  Constitutional:      General: She is not in acute distress.    Appearance: She is well-developed and well-nourished.     Comments: Resting in the bed in NAD  HENT:     Head: Normocephalic and atraumatic.  Eyes:     Extraocular Movements: EOM normal.     Conjunctiva/sclera: Conjunctivae normal.     Pupils: Pupils are equal, round, and reactive to light.  Cardiovascular:     Rate and Rhythm: Normal rate and regular rhythm.     Pulses: Normal pulses and intact distal pulses.  Pulmonary:     Effort: Pulmonary effort is normal. No respiratory distress.     Breath sounds: Normal breath sounds. No wheezing.  Abdominal:     General: There is no distension.     Palpations: There is no mass.     Tenderness: There is abdominal tenderness. There is no guarding or rebound.     Comments: Diffuse TTP of the  abdomen, worse in the lower abdomen.  Firmness of the lower abdomen.  No rigidity or distention.  Genitourinary:    Comments: Frank rectal bleeding noted on exam.  No obvious bleeding from external hemorrhoids.  No fissure.  blood is bright red Musculoskeletal:        General: Normal range of motion.     Cervical back: Normal range of motion and neck supple.  Skin:    General: Skin is warm and dry.     Capillary Refill: Capillary refill takes less than 2 seconds.  Neurological:     Mental Status: She is alert and oriented to person, place, and time.  Psychiatric:        Mood and Affect: Mood and affect normal.     ED Results / Procedures / Treatments   Labs (all labs ordered are listed, but only abnormal results are displayed) Labs Reviewed  CBC WITH DIFFERENTIAL/PLATELET - Abnormal; Notable for the following components:      Result Value   RBC 3.72 (*)    Hemoglobin 11.4 (*)    HCT 35.8 (*)    Monocytes Absolute 1.1 (*)    All other components within normal limits  COMPREHENSIVE METABOLIC PANEL - Abnormal; Notable for the following components:   Glucose, Bld 116 (*)    Albumin 3.3 (*)    AST 14 (*)    GFR, Estimated 51 (*)    All other components within normal limits  POC OCCULT BLOOD, ED - Abnormal; Notable for the following components:   Fecal Occult Bld POSITIVE (*)    All other components within normal limits  RESP PANEL BY RT-PCR (FLU A&B, COVID) ARPGX2    EKG None  Radiology No results found.  Procedures Procedures   Medications Ordered in ED Medications  iohexol (OMNIPAQUE) 350 MG/ML injection 100 mL (100 mLs Intravenous Contrast Given 03/10/20 2336)    ED Course  I have reviewed the triage vital signs and the nursing notes.  Pertinent labs & imaging results that were available during my care of the patient were reviewed by me and considered in my medical decision making (see  chart for details).    MDM Rules/Calculators/A&P                           Patient presenting for evaluation of rectal bleeding.  On exam, patient appears nontoxic.  She does have frank bleeding noted on rectal exam, diffuse abdominal tenderness.  Concern for diverticular bleed.  Considering her history, concern for bleeding hemorrhoid.  As patient has worsening bleeding, increasing symptoms, and continued bleeding on my exam, she will likely need to be admitted for hemoglobin trending and further evaluation of bleeding.  Will obtain labs and CT abdomen pelvis for further evaluation.  Labs interpreted by me, mild anemia of 11.4, down from 12.7 when checked 7 months ago.  However this time, with stable vitals, I do not believe she needs emergent transfusion.  Otherwise labs are reassuring, no leukocytosis.  Electrolytes are stable.  Pt signed out to me by Lawerance Bach, PA-C for f/u on CTA abd admission.   Final Clinical Impression(s) / ED Diagnoses Final diagnoses:  Acute GI bleeding    Rx / DC Orders ED Discharge Orders    None       Franchot Heidelberg, PA-C 03/11/20 0024    Sherwood Gambler, MD 03/13/20 [phone]

## 2020-03-10 NOTE — ED Triage Notes (Signed)
[name] from [facility] home. Patient has history of rectal bleeding and last 5 days has been getting worse. Tonight, had a lot of clots in it, bright and dark red and wanted to get it checked out.   Patient has history of CAD, HTN.   Allergic to Penicillin and ACE inhibitors  EMS vitals  120/82 98% Room air 64 HR 16 RR CBG 148

## 2020-03-11 DIAGNOSIS — I2510 Atherosclerotic heart disease of native coronary artery without angina pectoris: Secondary | ICD-10-CM | POA: Diagnosis present

## 2020-03-11 DIAGNOSIS — K625 Hemorrhage of anus and rectum: Secondary | ICD-10-CM | POA: Diagnosis not present

## 2020-03-11 DIAGNOSIS — Z79890 Hormone replacement therapy (postmenopausal): Secondary | ICD-10-CM | POA: Diagnosis not present

## 2020-03-11 DIAGNOSIS — Z888 Allergy status to other drugs, medicaments and biological substances status: Secondary | ICD-10-CM | POA: Diagnosis not present

## 2020-03-11 DIAGNOSIS — D649 Anemia, unspecified: Secondary | ICD-10-CM | POA: Diagnosis not present

## 2020-03-11 DIAGNOSIS — M1710 Unilateral primary osteoarthritis, unspecified knee: Secondary | ICD-10-CM | POA: Diagnosis present

## 2020-03-11 DIAGNOSIS — I493 Ventricular premature depolarization: Secondary | ICD-10-CM | POA: Diagnosis present

## 2020-03-11 DIAGNOSIS — K5730 Diverticulosis of large intestine without perforation or abscess without bleeding: Secondary | ICD-10-CM | POA: Diagnosis not present

## 2020-03-11 DIAGNOSIS — R0789 Other chest pain: Secondary | ICD-10-CM | POA: Diagnosis present

## 2020-03-11 DIAGNOSIS — K922 Gastrointestinal hemorrhage, unspecified: Secondary | ICD-10-CM | POA: Diagnosis not present

## 2020-03-11 DIAGNOSIS — Z90710 Acquired absence of both cervix and uterus: Secondary | ICD-10-CM | POA: Diagnosis not present

## 2020-03-11 DIAGNOSIS — K921 Melena: Secondary | ICD-10-CM | POA: Diagnosis present

## 2020-03-11 DIAGNOSIS — K5731 Diverticulosis of large intestine without perforation or abscess with bleeding: Secondary | ICD-10-CM | POA: Diagnosis present

## 2020-03-11 DIAGNOSIS — I130 Hypertensive heart and chronic kidney disease with heart failure and stage 1 through stage 4 chronic kidney disease, or unspecified chronic kidney disease: Secondary | ICD-10-CM | POA: Diagnosis present

## 2020-03-11 DIAGNOSIS — Z880 Allergy status to penicillin: Secondary | ICD-10-CM | POA: Diagnosis not present

## 2020-03-11 DIAGNOSIS — Z885 Allergy status to narcotic agent status: Secondary | ICD-10-CM | POA: Diagnosis not present

## 2020-03-11 DIAGNOSIS — Z66 Do not resuscitate: Secondary | ICD-10-CM | POA: Diagnosis present

## 2020-03-11 DIAGNOSIS — Z20822 Contact with and (suspected) exposure to covid-19: Secondary | ICD-10-CM | POA: Diagnosis present

## 2020-03-11 DIAGNOSIS — E785 Hyperlipidemia, unspecified: Secondary | ICD-10-CM | POA: Diagnosis present

## 2020-03-11 DIAGNOSIS — I10 Essential (primary) hypertension: Secondary | ICD-10-CM | POA: Diagnosis not present

## 2020-03-11 DIAGNOSIS — N1831 Chronic kidney disease, stage 3a: Secondary | ICD-10-CM

## 2020-03-11 DIAGNOSIS — Z8249 Family history of ischemic heart disease and other diseases of the circulatory system: Secondary | ICD-10-CM | POA: Diagnosis not present

## 2020-03-11 DIAGNOSIS — Z79899 Other long term (current) drug therapy: Secondary | ICD-10-CM | POA: Diagnosis not present

## 2020-03-11 DIAGNOSIS — Z8679 Personal history of other diseases of the circulatory system: Secondary | ICD-10-CM | POA: Diagnosis not present

## 2020-03-11 DIAGNOSIS — K649 Unspecified hemorrhoids: Secondary | ICD-10-CM | POA: Diagnosis present

## 2020-03-11 DIAGNOSIS — K529 Noninfective gastroenteritis and colitis, unspecified: Secondary | ICD-10-CM | POA: Diagnosis present

## 2020-03-11 DIAGNOSIS — I272 Pulmonary hypertension, unspecified: Secondary | ICD-10-CM | POA: Diagnosis present

## 2020-03-11 DIAGNOSIS — I371 Nonrheumatic pulmonary valve insufficiency: Secondary | ICD-10-CM | POA: Diagnosis present

## 2020-03-11 DIAGNOSIS — I509 Heart failure, unspecified: Secondary | ICD-10-CM | POA: Diagnosis present

## 2020-03-11 DIAGNOSIS — Z881 Allergy status to other antibiotic agents status: Secondary | ICD-10-CM | POA: Diagnosis not present

## 2020-03-11 LAB — CBC
HCT: 34.4 % — ABNORMAL LOW (ref 36.0–46.0)
HCT: 34.6 % — ABNORMAL LOW (ref 36.0–46.0)
Hemoglobin: 11 g/dL — ABNORMAL LOW (ref 12.0–15.0)
Hemoglobin: 10.9 g/dL — ABNORMAL LOW (ref 12.0–15.0)
MCH: 31 pg (ref 26.0–34.0)
MCH: 30.5 pg (ref 26.0–34.0)
MCHC: 32 g/dL (ref 30.0–36.0)
MCHC: 31.5 g/dL (ref 30.0–36.0)
MCV: 96.9 fL (ref 80.0–100.0)
MCV: 96.9 fL (ref 80.0–100.0)
Platelets: 156 10*3/uL (ref 150–400)
Platelets: 149 10*3/uL — ABNORMAL LOW (ref 150–400)
RBC: 3.55 MIL/uL — ABNORMAL LOW (ref 3.87–5.11)
RBC: 3.57 MIL/uL — ABNORMAL LOW (ref 3.87–5.11)
RDW: 13.4 % (ref 11.5–15.5)
RDW: 13.6 % (ref 11.5–15.5)
WBC: 8.3 10*3/uL (ref 4.0–10.5)
WBC: 7.2 10*3/uL (ref 4.0–10.5)
nRBC: 0 % (ref 0.0–0.2)
nRBC: 0 % (ref 0.0–0.2)

## 2020-03-11 LAB — BASIC METABOLIC PANEL
Anion gap: 10 (ref 5–15)
BUN: 18 mg/dL (ref 8–23)
CO2: 24 mmol/L (ref 22–32)
Calcium: 8.6 mg/dL — ABNORMAL LOW (ref 8.9–10.3)
Chloride: 107 mmol/L (ref 98–111)
Creatinine, Ser: 0.78 mg/dL (ref 0.44–1.00)
GFR, Estimated: >60 mL/min (ref >60)
Glucose, Bld: 97 mg/dL (ref 70–99)
Potassium: 3.5 mmol/L (ref 3.5–5.1)
Sodium: 141 mmol/L (ref 135–145)

## 2020-03-11 LAB — RESP PANEL BY RT-PCR (FLU A&B, COVID) ARPGX2
Influenza A by PCR: NEGATIVE
Influenza B by PCR: NEGATIVE
SARS Coronavirus 2 by RT PCR: NEGATIVE

## 2020-03-11 LAB — HEMOGLOBIN AND HEMATOCRIT, BLOOD
HCT: 50.1 % — ABNORMAL HIGH (ref 36.0–46.0)
Hemoglobin: 16.6 g/dL — ABNORMAL HIGH (ref 12.0–15.0)

## 2020-03-11 MED ORDER — SODIUM CHLORIDE 0.9 % IV SOLN: INTRAVENOUS | Status: AC

## 2020-03-11 MED ORDER — ACETAMINOPHEN 325 MG PO TABS
650.0000 mg | ORAL_TABLET | Freq: Four times a day (QID) | ORAL | Status: DC | PRN
  Administered 2020-03-11 – 2020-03-15 (×6): 650 mg via ORAL
  Filled 2020-03-11 (×6): qty 2

## 2020-03-11 MED ORDER — PAROXETINE HCL 10 MG PO TABS
10.0000 mg | ORAL_TABLET | Freq: Every day | ORAL | Status: DC
  Administered 2020-03-11 – 2020-03-15 (×5): 10 mg via ORAL
  Filled 2020-03-11 (×5): qty 1

## 2020-03-11 MED ORDER — HYDROCORTISONE ACETATE 25 MG RE SUPP
25.0000 mg | Freq: Two times a day (BID) | RECTAL | Status: DC
  Administered 2020-03-11 – 2020-03-14 (×5): 25 mg via RECTAL
  Filled 2020-03-11 (×11): qty 1

## 2020-03-11 MED ORDER — ONDANSETRON HCL 4 MG/2ML IJ SOLN: 4.0000 mg | Freq: Four times a day (QID) | INTRAMUSCULAR | Status: DC | PRN

## 2020-03-11 MED ORDER — PANTOPRAZOLE SODIUM 40 MG PO TBEC
40.0000 mg | DELAYED_RELEASE_TABLET | ORAL | Status: DC
  Administered 2020-03-13 – 2020-03-15 (×2): 40 mg via ORAL
  Filled 2020-03-11 (×3): qty 1

## 2020-03-11 MED ORDER — POTASSIUM CHLORIDE CRYS ER 20 MEQ PO TBCR
20.0000 meq | EXTENDED_RELEASE_TABLET | Freq: Every day | ORAL | Status: DC
  Administered 2020-03-11 – 2020-03-15 (×5): 20 meq via ORAL
  Filled 2020-03-11 (×5): qty 1

## 2020-03-11 MED ORDER — FENTANYL CITRATE (PF) 100 MCG/2ML IJ SOLN
12.5000 ug | Freq: Once | INTRAMUSCULAR | Status: AC
Start: 2020-03-11 — End: 2020-03-11
  Administered 2020-03-11: 12.5 ug via INTRAVENOUS
  Filled 2020-03-11: qty 2

## 2020-03-11 NOTE — Consult Note (Signed)
[facility] Consultation Note  Referring Provider: [facility] Primary Care Physician:  Josetta Huddle, MD  Reason for Consultation:  Hematochezia  HPI: [name] is a 87 y.o. female presenting with hematochezia.  Recurrent problem over the years; had sigmoidoscopy and subsequent hemorrhoidal banding done in 2015 for bleeding.  Patient reports some abdominal soreness and bright red rectal bleeding intermittently over the past nearly one week.  Denies constipation/straining.  Hgb slightly low 11.  CTA upon admission no bleeding identified.  She has known hemorrhoids and colonic diverticulosis.   Past Medical History:  Diagnosis Date  . Allergic rhinitis   . CAD (coronary artery disease)   . CHF (congestive heart failure) ([place])   . CKD (chronic kidney disease) stage 3, GFR 30-59 ml/min (HCC) 09/12/2014  . Diverticular disease   . Diverticulosis   . DJD (degenerative joint disease) of knee   . DJD (degenerative joint disease) of lumbar spine    scoliosis  . Eczema   . Esophageal erosions    from nsaid   . GI bleed   . Hormone replacement therapy (postmenopausal)   . Hyperglycemia   . Hyperlipidemia   . Hypertension   . Pneumonia    july 2009  . Pulmonary hypertension ([place])    a. 2D echo 03/2008 showed normal biventricular thickness, chamber size and systolic function, EF 60-73%, mild mitral regurgitation, aortic sclerosis, moderate pulmonary HTN, pulmonic regurgitation.  Marland Kitchen PVC's (premature ventricular contractions)   . Reactive depression (situational)   . Sciatica    right lower extremity  . Shingles    right thoracic 2008  . Vertigo     Past Surgical History:  Procedure Laterality Date  . APPENDECTOMY    . BACK SURGERY    . BREAST LUMPECTOMY    . FLEXIBLE SIGMOIDOSCOPY Left 08/13/2013   Procedure: FLEXIBLE SIGMOIDOSCOPY;  Surgeon: Arta Silence, MD;  Location: [facility];  Service: Endoscopy;  Laterality: Left;  . TOTAL ABDOMINAL HYSTERECTOMY       Prior to Admission medications   Medication Sig Start Date End Date Taking? Authorizing Provider  amLODipine (NORVASC) 2.5 MG tablet Take 2.5 mg by mouth daily. 03/10/20  Yes [provider]  furosemide (LASIX) 40 MG tablet Take 20 mg by mouth as directed. Take 0.5 tablet by mouth Monday, Wednesday, Friday 09/10/16  Yes [provider]  isosorbide mononitrate (IMDUR) 30 MG 24 hr tablet Take 30 mg by mouth daily. 03/10/20  Yes [provider]  metoprolol succinate (TOPROL-XL) 25 MG 24 hr tablet Take 1 tablet (25 mg total) by mouth daily. 11/17/18  Yes Jettie Booze, MD  pantoprazole (PROTONIX) 40 MG tablet Take 40 mg by mouth as directed. Take on MWF 11/24/17  Yes [provider]  PARoxetine (PAXIL) 10 MG tablet Take 10 mg by mouth daily. 03/10/20  Yes [provider]  Polyvinyl Alcohol-Povidone (REFRESH OP) Apply 1 drop to eye 2 (two) times daily.   Yes [provider]  potassium chloride SA (K-DUR,KLOR-CON) 20 MEQ tablet Take 20 mEq by mouth daily.    Yes [provider]  PREMARIN 0.3 MG tablet Take 1-2 tablets by mouth daily. 03/10/20  Yes [provider]  Prenatal Vit-Fe Fumarate-FA (PRENATAL PO) Take 0.5 tablets by mouth daily.   Yes [provider]  acetaminophen (TYLENOL) 325 MG tablet Take 325-650 mg by mouth every 6 (six) hours.    [provider]  ANUCORT-HC 25 MG suppository Place 1 suppository rectally as needed. 02/07/20  [provider]  clotrimazole (LOTRIMIN) 1 % cream Apply 1 application topically 2 (two) times daily as needed.    [provider]  diclofenac Sodium (VOLTAREN) 1 % GEL Apply 2 g topically QID. 08/13/19   Guilford Shi, MD  hydrOXYzine (ATARAX/VISTARIL) 10 MG tablet Take 1 tablet (10 mg total) by mouth 3 (three) times daily as needed for itching or anxiety. Patient not taking: Reported on 03/10/2020 08/13/19   Guilford Shi, MD  loperamide  (IMODIUM) 2 MG capsule Take 1 capsule (2 mg total) by mouth as needed for diarrhea or loose stools. Patient not taking: Reported on 03/10/2020 08/12/16   Domenic Polite, MD  meclizine (ANTIVERT) 25 MG tablet Take 25 mg by mouth 2 (two) times daily as needed for dizziness or nausea.    [provider]  polyethylene glycol (MIRALAX) packet Take 17 g by mouth daily as needed for mild constipation. 08/21/17   Jani Gravel, MD    Current Facility-Administered Medications  Medication Dose Route Frequency Provider Last Rate Last Admin  . [START ON 03/13/2020] pantoprazole (PROTONIX) EC tablet 40 mg  40 mg Oral Q M,W,F Tu, Ching T, DO      . PARoxetine (PAXIL) tablet 10 mg  10 mg Oral Daily Tu, Ching T, DO   10 mg at 03/11/20 0857  . potassium chloride SA (KLOR-CON) CR tablet 20 mEq  20 mEq Oral Daily Tu, Ching T, DO   20 mEq at 03/11/20 0857    Allergies as of 03/10/2020 - Review Complete 03/10/2020  Allergen Reaction Noted  . Ace inhibitors Hives and Itching 12/25/2010  . Streptomycin Other (See Comments) and Itching 12/25/2010  . Tramadol Other (See Comments) 12/25/2010  . Vesicare [solifenacin succinate] Itching 08/04/2016  . Penicillins Rash 12/25/2010    Family History  Problem Relation Age of Onset  . Heart disease Father   . Heart attack Father   . Heart disease Mother   . Heart attack Mother   . Heart disease Daughter   . Heart disease Son   . Cancer Brother   . Heart disease Sister   . Heart attack Sister        X2  . Heart attack Brother   . Hypertension Brother   . Stroke Neg Hx     Social History   Socioeconomic History  . Marital status: Widowed    Spouse name: Not on file  . Number of children: 0  . Years of education: Not on file  . Highest education level: Not on file  Occupational History  . Occupation: retired Education officer, [employer]  Tobacco Use  . Smoking status: Never Smoker  . Smokeless tobacco: Never Used  Vaping Use  . Vaping Use: Never used   Substance and Sexual Activity  . Alcohol use: Yes    Comment: occasional cocktail  . Drug use: No  . Sexual activity: Never  Other Topics Concern  . Not on file  Social History Narrative   Pt is widowed and lives alone.         Social Determinants of Health   Financial Resource Strain: Not on file  Food Insecurity: Not on file  Transportation Needs: Not on file  Physical Activity: Not on file  Stress: Not on file  Social Connections: Not on file  Intimate Partner Violence: Not on file    Review of Systems: As per HPI, all others negative  Physical Exam: Vital signs in last 24 hours: Temp:  [98 F (36.7 C)-98.4 F (  36.9 C)] 98.4 F (36.9 C) (02/12 0644) Pulse Rate:  [53-70] 53 (02/12 0644) Resp:  [17-20] 18 (02/12 0644) BP: (101-126)/(52-83) 110/63 (02/12 0644) SpO2:  [93 %-100 %] 98 % (02/12 0644) Last BM Date: 03/10/20 General:   Alert,  Well-developed, well-nourished, pleasant and cooperative in NAD Head:  Normocephalic and atraumatic. Eyes:  Sclera clear, no icterus.   Conjunctiva pink. Ears:  Normal auditory acuity. Nose:  No deformity, discharge,  or lesions. Mouth:  No deformity or lesions.  Oropharynx pink & moist. Neck:  Supple; no masses or thyromegaly. Abdomen:  Soft, nontender and nondistended. No masses, hepatosplenomegaly or hernias noted. Normal bowel sounds, without guarding, and without rebound.     Msk:  Symmetrical without gross deformities. Normal posture. Pulses:  Normal pulses noted. Extremities:  Without clubbing or edema. Neurologic:  Alert and  oriented x4;  grossly normal neurologically. Skin:  Thin skin, scattered ecchymosis, otherwise Intact without significant lesions or rashes. Psych:  Alert and cooperative. Normal mood and affect.   Lab Results: Recent Labs    03/10/20 2210 03/11/20 0633  WBC 8.9 8.3  HGB 11.4* 11.0*  HCT 35.8* 34.4*  PLT 178 156   BMET Recent Labs    03/10/20 2210 03/11/20 0633  NA 140 141  K 3.8 3.5   CL 104 107  CO2 25 24  GLUCOSE 116* 97  BUN 20 18  CREATININE 0.98 0.78  CALCIUM 8.9 8.6*   LFT Recent Labs    03/10/20 2210  PROT 7.0  ALBUMIN 3.3*  AST 14*  ALT 8  ALKPHOS 67  BILITOT 0.5   PT/INR No results for input(s): LABPROT, INR in the last 72 hours.  Studies/Results: CT Angio Pelvis W and/or Wo Contrast  Result Date: 03/11/2020 CLINICAL DATA:  Gastrointestinal bleeding. EXAM: CT ANGIOGRAPHY ABDOMEN and pelvis TECHNIQUE: Multidetector CT imaging of the abdomen was performed using the standard protocol during bolus administration of intravenous contrast. Multiplanar reconstructed images and MIPs were obtained and reviewed to evaluate the vascular anatomy. CONTRAST:  172mL OMNIPAQUE IOHEXOL 350 MG/ML SOLN COMPARISON:  CT abdomen 08/20/2017 FINDINGS: Mild atelectasis at the lung bases.  Large hiatal hernia is present. No liver parenchymal lesion is seen. No calcified gallstones. No stone in the common duct. Pancreas appears normal. Spleen is normal. Adrenal glands are normal. Kidneys are slightly small but do not show any focal lesion or obstruction. Bladder is normal. Hiatal hernia as noted above. No evidence of bleeding within the stomach. Small bowel appears normal without evidence of obstruction. No identifiable small-bowel bleeding. Colon contains a moderate amount of gas and stool. There is diverticulosis. No sign of arterial bleeding in the colon. No free fluid or air. Scoliotic curvature of the spine with pronounced chronic degenerative changes. The aorta is markedly tortuous. There is aortic atherosclerotic calcification but no aneurysm. Moderate proximal stenosis affecting the celiac artery. Superior mesenteric artery is widely patent. Inferior mesenteric artery is widely patent. Both renal arteries are widely patent. Common iliac arteries show atherosclerotic calcification but no aneurysm or stenosis. Internal iliac arteries and external iliac arteries are patent. Common  femoral arteries are patent. IMPRESSION: 1. No evidence of arterial bleeding in the intestine. 2. Large hiatal hernia. 3. Diverticulosis without evidence of diverticulitis. 4. Atherosclerotic disease of the aorta and its branch vessels. 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 Aortic Atherosclerosis (ICD10-I70.0). Electronically Signed   By: Nelson Chimes M.D.   On: 03/11/2020 00:22   CT Angio Abdomen W and/or Wo Contrast  Result Date: 03/11/2020 CLINICAL DATA:  Gastrointestinal bleeding. EXAM: CT ANGIOGRAPHY ABDOMEN and pelvis TECHNIQUE: Multidetector CT imaging of the abdomen was performed using the standard protocol during bolus administration of intravenous contrast. Multiplanar reconstructed images and MIPs were obtained and reviewed to evaluate the vascular anatomy. CONTRAST:  143mL OMNIPAQUE IOHEXOL 350 MG/ML SOLN COMPARISON:  CT abdomen 08/20/2017 FINDINGS: Mild atelectasis at the lung bases.  Large hiatal hernia is present. No liver parenchymal lesion is seen. No calcified gallstones. No stone in the common duct. Pancreas appears normal. Spleen is normal. Adrenal glands are normal. Kidneys are slightly small but do not show any focal lesion or obstruction. Bladder is normal. Hiatal hernia as noted above. No evidence of bleeding within the stomach. Small bowel appears normal without evidence of obstruction. No identifiable small-bowel bleeding. Colon contains a moderate amount of gas and stool. There is diverticulosis. No sign of arterial bleeding in the colon. No free fluid or air. Scoliotic curvature of the spine with pronounced chronic degenerative changes. The aorta is markedly tortuous. There is aortic atherosclerotic calcification but no aneurysm. Moderate proximal stenosis affecting the celiac artery. Superior mesenteric artery is widely patent. Inferior mesenteric artery is widely patent. Both renal arteries are widely patent. Common iliac arteries show atherosclerotic calcification but  no aneurysm or stenosis. Internal iliac arteries and external iliac arteries are patent. Common femoral arteries are patent. IMPRESSION: 1. No evidence of arterial bleeding in the intestine. 2. Large hiatal hernia. 3. Diverticulosis without evidence of diverticulitis. 4. Atherosclerotic disease of the aorta and its branch vessels. 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 Aortic Atherosclerosis (ICD10-I70.0). Electronically Signed   By: Nelson Chimes M.D.   On: 03/11/2020 00:22    Impression:  1.  Hematochezia.  CTA negative yesterday.  Normal BUN.  Differential diagnosis diverticulosis (favored) vs hemorrhoids (has had this type of bleeding before requiring banding).  Do not suspect UGI bleeding. 2.  Trace anemia.  Plan:  1.  Clear liquid diet ok from GI perspective. 2.  Hemorrhoidal suppositories. 3.  Follow CBCs. 4.  Given negative CTA and only trace anemia, would favor medical management at this point.  Would rather not do sigmoidoscopy/colonoscopy given patient's age and comorbidities. 5.  If ongoing bleeding today, could consider tagged RBC study. 6.  [facility] will follow.   LOS: 0 days   Malarie Tappen M  03/11/2020, 9:35 AM  Cell [phone] If no answer or after 5 PM call [phone]

## 2020-03-11 NOTE — Plan of Care (Signed)
  Problem: Activity: Goal: Risk for activity intolerance will decrease Outcome: Progressing   

## 2020-03-11 NOTE — Progress Notes (Signed)
Patient admitted earlier this morning.  H&P reviewed.  Patient seen and examined.  Patient denies any pain.  She is very hard of hearing.  Seems to be somewhat distracted as well.  So history is limited.  Vital signs are stable.  Lungs are clear to auscultation bilaterally S1-S2 is normal regular Abdomen is soft.  There is tenderness appreciated over the suprapubic area without any rebound rigidity or guarding.  Hemoglobin noted to be stable.  Patient admitted with hematochezia.  Gastroenterology has been consulted.  CT angiogram did not show any acute bleeding.  Diverticulosis was noted.  Patient did have suprapubic tenderness on examination.  A bladder scan was done revealing 360 mL of urine after which she was able to void.  Continue to monitor her bleeding.  Recheck hemoglobin this afternoon and tomorrow.  Bonnielee Haff 03/11/2020

## 2020-03-11 NOTE — Plan of Care (Signed)

## 2020-03-11 NOTE — ED Notes (Signed)
(  [phone] [name], patients niece and healthcare power of attorney. Wants to be notified when patient gets moved upstairs.

## 2020-03-11 NOTE — ED Notes (Signed)
[name], niece called and updated on patients admission to 6th floor room 11.

## 2020-03-11 NOTE — H&P (Addendum)
History and Physical    [name] DGL:[id number] DOB: 1916-05-21 DOA: 03/10/2020  PCP: Josetta Huddle, MD  [facility] from[facility] [place] [facility]  I have personally briefly reviewed patient's old medical records in [facility]  Chief Complaint: Rectal bleed  HPI: [name] is a 87 y.o. female with medical history significant for history of GI bleed, subdural hemorrhage, PVC, hypertension and CKD stage IIIa who presents from her facility for concerns of rectal bleeding.  Patient reports that about 5 days ago she noticed "gushing" of blood out of her rectum but decided to monitor it.  She began to notice it daily and it is only sometimes associated with a bowel movement. She has chronic diarrhea due to the fact that she takes stool softeners for constipation.  Notes diffuse abdominal pain. Also has felt some dizziness and lightheadedness.  Has some dyspnea with exertion.  States she chronically has some left-sided anterior chest pain. No fever.  She is not on any anticoagulation.Denies NSAIDS use.  She has history of hysterectomy and appendectomy.  Patient has history of GI bleed back in 2015 and had a flex-sig showing bleeding from internal hemorrhoids.  She then underwent banding by general surgery.   ED Course: She had normal vitals stable hemoglobin of 11.4.  Fecal occult blood positive CMP largely unremarkable.  CTA of the abdomen showed no evidence of arterial bleeding and no diverticulitis  Review of Systems:  Constitutional: No Weight Change, No Fever ENT/Mouth: No sore throat, No Rhinorrhea Eyes: No Eye Pain, No Vision Changes Cardiovascular: No Chest Pain, + SOB, No PND, + Dyspnea on Exertion, No Orthopnea,No Edema, No Palpitations Respiratory: No Cough, No Sputum, No Wheezing, no Dyspnea  Gastrointestinal: No Nausea, No Vomiting, + Diarrhea, No Constipation, + Pain Genitourinary: no Urinary Incontinence, No Urgency, No Flank  Pain Musculoskeletal: No Arthralgias, No Myalgias Skin: No Skin Lesions, No Pruritus, Neuro: no Weakness, No Numbness,  No Loss of Consciousness, No Syncope Psych: No Anxiety/Panic, No Depression, no decrease appetite Heme/Lymph: No Bruising, + Bleeding   Past Medical History:  Diagnosis Date  . Allergic rhinitis   . CAD (coronary artery disease)   . CHF (congestive heart failure) ([place])   . CKD (chronic kidney disease) stage 3, GFR 30-59 ml/min (HCC) 09/12/2014  . Diverticular disease   . Diverticulosis   . DJD (degenerative joint disease) of knee   . DJD (degenerative joint disease) of lumbar spine    scoliosis  . Eczema   . Esophageal erosions    from nsaid   . GI bleed   . Hormone replacement therapy (postmenopausal)   . Hyperglycemia   . Hyperlipidemia   . Hypertension   . Pneumonia    july 2009  . Pulmonary hypertension ([place])    a. 2D echo 03/2008 showed normal biventricular thickness, chamber size and systolic function, EF 51-88%, mild mitral regurgitation, aortic sclerosis, moderate pulmonary HTN, pulmonic regurgitation.  Marland Kitchen PVC's (premature ventricular contractions)   . Reactive depression (situational)   . Sciatica    right lower extremity  . Shingles    right thoracic 2008  . Vertigo     Past Surgical History:  Procedure Laterality Date  . APPENDECTOMY    . BACK SURGERY    . BREAST LUMPECTOMY    . FLEXIBLE SIGMOIDOSCOPY Left 08/13/2013   Procedure: FLEXIBLE SIGMOIDOSCOPY;  Surgeon: Arta Silence, MD;  Location: [facility];  Service: Endoscopy;  Laterality: Left;  . TOTAL ABDOMINAL HYSTERECTOMY  reports that she has never smoked. She has never used smokeless tobacco. She reports current alcohol use. She reports that she does not use drugs. Social History  Allergies  Allergen Reactions  . Ace Inhibitors Hives and Itching  . Streptomycin Other (See Comments) and Itching    Pruritus Pruritus   . Tramadol Other (See Comments)    unknown  .  Vesicare [Solifenacin Succinate] Itching  . Penicillins Rash    Pruritic rash Has patient had a PCN reaction causing immediate rash, facial/tongue/throat swelling, SOB or lightheadedness with hypotension: unknown Has patient had a PCN reaction causing severe rash involving mucus membranes or skin necrosis: unknown Has patient had a PCN reaction that required hospitalization:unknown Has patient had a PCN reaction occurring within the last 10 years: yes If all of the above answers are "NO", then may proceed with Cephalosporin use.     Family History  Problem Relation Age of Onset  . Heart disease Father   . Heart attack Father   . Heart disease Mother   . Heart attack Mother   . Heart disease Daughter   . Heart disease Son   . Cancer Brother   . Heart disease Sister   . Heart attack Sister        X2  . Heart attack Brother   . Hypertension Brother   . Stroke Neg Hx      Prior to Admission medications   Medication Sig Start Date End Date Taking? Authorizing Provider  amLODipine (NORVASC) 2.5 MG tablet Take 2.5 mg by mouth daily. 03/10/20  Yes [provider]  furosemide (LASIX) 40 MG tablet Take 20 mg by mouth as directed. Take 0.5 tablet by mouth Monday, Wednesday, Friday 09/10/16  Yes [provider]  isosorbide mononitrate (IMDUR) 30 MG 24 hr tablet Take 30 mg by mouth daily. 03/10/20  Yes [provider]  metoprolol succinate (TOPROL-XL) 25 MG 24 hr tablet Take 1 tablet (25 mg total) by mouth daily. 11/17/18  Yes Jettie Booze, MD  pantoprazole (PROTONIX) 40 MG tablet Take 40 mg by mouth as directed. Take on MWF 11/24/17  Yes [provider]  PARoxetine (PAXIL) 10 MG tablet Take 10 mg by mouth daily. 03/10/20  Yes [provider]  Polyvinyl Alcohol-Povidone (REFRESH OP) Apply 1 drop to eye 2 (two) times daily.   Yes [provider]  potassium chloride SA (K-DUR,KLOR-CON) 20 MEQ tablet Take 20 mEq by mouth daily.    Yes  [provider]  PREMARIN 0.3 MG tablet Take 1-2 tablets by mouth daily. 03/10/20  Yes [provider]  Prenatal Vit-Fe Fumarate-FA (PRENATAL PO) Take 0.5 tablets by mouth daily.   Yes [provider]  acetaminophen (TYLENOL) 325 MG tablet Take 325-650 mg by mouth every 6 (six) hours.    [provider]  ANUCORT-HC 25 MG suppository Place 1 suppository rectally as needed. 02/07/20   [provider]  clotrimazole (LOTRIMIN) 1 % cream Apply 1 application topically 2 (two) times daily as needed.    [provider]  diclofenac Sodium (VOLTAREN) 1 % GEL Apply 2 g topically QID. 08/13/19   [place]ford Shi, MD  hydrOXYzine (ATARAX/VISTARIL) 10 MG tablet Take 1 tablet (10 mg total) by mouth 3 (three) times daily as needed for itching or anxiety. Patient not taking: Reported on 03/10/2020 08/13/19   [place] Shi, MD  loperamide (IMODIUM) 2 MG capsule Take 1 capsule (2 mg total) by mouth as needed for diarrhea or loose stools. Patient  not taking: Reported on 03/10/2020 08/12/16   Domenic Polite, MD  meclizine (ANTIVERT) 25 MG tablet Take 25 mg by mouth 2 (two) times daily as needed for dizziness or nausea.    [provider]  polyethylene glycol (MIRALAX) packet Take 17 g by mouth daily as needed for mild constipation. 08/21/17   Jani Gravel, MD    Physical Exam: Vitals:   03/10/20 2200 03/10/20 2230 03/10/20 2300 03/11/20 0020  BP: 101/63 (!) 108/52 112/83 (!) 104/54  Pulse: 63 62 61 60  Resp: 17 18 20 19   Temp:      TempSrc:      SpO2: 97% 96% 97% 96%    Constitutional: NAD, calm, comfortable, pleasant elderly female laying flat in bed asleep Vitals:   03/10/20 2200 03/10/20 2230 03/10/20 2300 03/11/20 0020  BP: 101/63 (!) 108/52 112/83 (!) 104/54  Pulse: 63 62 61 60  Resp: 17 18 20 19   Temp:      TempSrc:      SpO2: 97% 96% 97% 96%   Eyes: PERRL, lids and conjunctivae normal ENMT: Mucous membranes are moist.  Neck: normal,  supple Respiratory: clear to auscultation bilaterally, no wheezing, no crackles. Normal respiratory effort. No accessory muscle use.  Cardiovascular: Regular rate and rhythm, no murmurs / rubs / gallops. No extremity edema.  Abdomen: Mild right lower quadrant tenderness, no rebound tenderness, guarding or rigidity, no masses palpated.. Bowel sounds positive.  Musculoskeletal: no clubbing / cyanosis. No joint deformity upper and lower extremities. Good ROM, no contractures. Normal muscle tone.  Skin: no rashes, lesions, ulcers. No induration Neurologic: CN 2-12 grossly intact. Sensation intact,  Strength 5/5 in all 4.  Psychiatric: Normal judgment and insight. Alert and oriented x 3. Normal mood.    Labs on Admission: I have personally reviewed following labs and imaging studies  CBC: Recent Labs  Lab 03/10/20 2210  WBC 8.9  NEUTROABS 5.0  HGB 11.4*  HCT 35.8*  MCV 96.2  PLT 683   Basic Metabolic Panel: Recent Labs  Lab 03/10/20 2210  NA 140  K 3.8  CL 104  CO2 25  GLUCOSE 116*  BUN 20  CREATININE 0.98  CALCIUM 8.9   GFR: CrCl cannot be calculated (Unknown ideal weight.). Liver Function Tests: Recent Labs  Lab 03/10/20 2210  AST 14*  ALT 8  ALKPHOS 67  BILITOT 0.5  PROT 7.0  ALBUMIN 3.3*   No results for input(s): LIPASE, AMYLASE in the last 168 hours. No results for input(s): AMMONIA in the last 168 hours. Coagulation Profile: No results for input(s): INR, PROTIME in the last 168 hours. Cardiac Enzymes: No results for input(s): CKTOTAL, CKMB, CKMBINDEX, TROPONINI in the last 168 hours. BNP (last 3 results) No results for input(s): PROBNP in the last 8760 hours. HbA1C: No results for input(s): HGBA1C in the last 72 hours. CBG: No results for input(s): GLUCAP in the last 168 hours. Lipid Profile: No results for input(s): CHOL, HDL, LDLCALC, TRIG, CHOLHDL, LDLDIRECT in the last 72 hours. Thyroid Function Tests: No results for input(s): TSH, T4TOTAL,  FREET4, T3FREE, THYROIDAB in the last 72 hours. Anemia Panel: No results for input(s): VITAMINB12, FOLATE, FERRITIN, TIBC, IRON, RETICCTPCT in the last 72 hours. Urine analysis:    Component Value Date/Time   COLORURINE YELLOW 08/09/2019 [place] 08/09/2019 1405   LABSPEC 1.018 08/09/2019 1405   PHURINE 5.0 08/09/2019 1405   GLUCOSEU NEGATIVE 08/09/2019 1405   HGBUR MODERATE (A) 08/09/2019 1405   BILIRUBINUR  NEGATIVE 08/09/2019 1405   KETONESUR NEGATIVE 08/09/2019 1405   PROTEINUR NEGATIVE 08/09/2019 1405   UROBILINOGEN 0.2 09/18/2014 1600   NITRITE NEGATIVE 08/09/2019 1405   LEUKOCYTESUR MODERATE (A) 08/09/2019 1405    Radiological Exams on Admission: CT Angio Pelvis W and/or Wo Contrast  Result Date: 03/11/2020 CLINICAL DATA:  Gastrointestinal bleeding. EXAM: CT ANGIOGRAPHY ABDOMEN and pelvis TECHNIQUE: Multidetector CT imaging of the abdomen was performed using the standard protocol during bolus administration of intravenous contrast. Multiplanar reconstructed images and MIPs were obtained and reviewed to evaluate the vascular anatomy. CONTRAST:  13mL OMNIPAQUE IOHEXOL 350 MG/ML SOLN COMPARISON:  CT abdomen 08/20/2017 FINDINGS: Mild atelectasis at the lung bases.  Large hiatal hernia is present. No liver parenchymal lesion is seen. No calcified gallstones. No stone in the common duct. Pancreas appears normal. Spleen is normal. Adrenal glands are normal. Kidneys are slightly small but do not show any focal lesion or obstruction. Bladder is normal. Hiatal hernia as noted above. No evidence of bleeding within the stomach. Small bowel appears normal without evidence of obstruction. No identifiable small-bowel bleeding. Colon contains a moderate amount of gas and stool. There is diverticulosis. No sign of arterial bleeding in the colon. No free fluid or air. Scoliotic curvature of the spine with pronounced chronic degenerative changes. The aorta is markedly tortuous. There is  aortic atherosclerotic calcification but no aneurysm. Moderate proximal stenosis affecting the celiac artery. Superior mesenteric artery is widely patent. Inferior mesenteric artery is widely patent. Both renal arteries are widely patent. Common iliac arteries show atherosclerotic calcification but no aneurysm or stenosis. Internal iliac arteries and external iliac arteries are patent. Common femoral arteries are patent. IMPRESSION: 1. No evidence of arterial bleeding in the intestine. 2. Large hiatal hernia. 3. Diverticulosis without evidence of diverticulitis. 4. Atherosclerotic disease of the aorta and its branch vessels. 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 Aortic Atherosclerosis (ICD10-I70.0). Electronically Signed   By: Nelson Chimes M.D.   On: 03/11/2020 00:22   CT Angio Abdomen W and/or Wo Contrast  Result Date: 03/11/2020 CLINICAL DATA:  Gastrointestinal bleeding. EXAM: CT ANGIOGRAPHY ABDOMEN and pelvis TECHNIQUE: Multidetector CT imaging of the abdomen was performed using the standard protocol during bolus administration of intravenous contrast. Multiplanar reconstructed images and MIPs were obtained and reviewed to evaluate the vascular anatomy. CONTRAST:  11mL OMNIPAQUE IOHEXOL 350 MG/ML SOLN COMPARISON:  CT abdomen 08/20/2017 FINDINGS: Mild atelectasis at the lung bases.  Large hiatal hernia is present. No liver parenchymal lesion is seen. No calcified gallstones. No stone in the common duct. Pancreas appears normal. Spleen is normal. Adrenal glands are normal. Kidneys are slightly small but do not show any focal lesion or obstruction. Bladder is normal. Hiatal hernia as noted above. No evidence of bleeding within the stomach. Small bowel appears normal without evidence of obstruction. No identifiable small-bowel bleeding. Colon contains a moderate amount of gas and stool. There is diverticulosis. No sign of arterial bleeding in the colon. No free fluid or air. Scoliotic curvature of  the spine with pronounced chronic degenerative changes. The aorta is markedly tortuous. There is aortic atherosclerotic calcification but no aneurysm. Moderate proximal stenosis affecting the celiac artery. Superior mesenteric artery is widely patent. Inferior mesenteric artery is widely patent. Both renal arteries are widely patent. Common iliac arteries show atherosclerotic calcification but no aneurysm or stenosis. Internal iliac arteries and external iliac arteries are patent. Common femoral arteries are patent. IMPRESSION: 1. No evidence of arterial bleeding in the intestine. 2. Large hiatal  hernia. 3. Diverticulosis without evidence of diverticulitis. 4. Atherosclerotic disease of the aorta and its branch vessels. 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 Aortic Atherosclerosis (ICD10-I70.0). Electronically Signed   By: Nelson Chimes M.D.   On: 03/11/2020 00:22      Assessment/Plan  Acute rectal bleed Hemoglobin stable 11.4.  Will repeat in the morning. GI has been notified by ED PA.  Has seen Dr. Paulita Fujita in the past. Patient has history of rectal bleed in the past found to be due to internal hemorrhoids on Flex-sig and required banding by general surgery Keep NPO.  Placed on continuous IV fluids.  Hypertension Hold antihypertensives overnight since systolics were in 66Y at my evaluation  CKD stage IIIa Creatinine stable.  Avoid nephrotoxic agent   DVT prophylaxis: SCD  code Status: DNR Family Communication: Plan discussed with patient at bedside  disposition Plan: Home with observation Consults called:  Admission status: Observation Level of care: Med-Surg  Status is: Observation  The patient remains OBS appropriate and will d/c before 2 midnights.  Dispo: The patient is from: SNF              Anticipated d/c is to: SNF              Anticipated d/c date is: 1 day              Patient currently is not medically stable to d/c.   Difficult to place patient No          Orene Desanctis DO [facility]   If 7PM-7AM, please contact night-coverage [email or web address]   03/11/2020, 12:58 AM

## 2020-03-12 DIAGNOSIS — K625 Hemorrhage of anus and rectum: Secondary | ICD-10-CM | POA: Diagnosis not present

## 2020-03-12 LAB — BASIC METABOLIC PANEL
Anion gap: 9 (ref 5–15)
BUN: 12 mg/dL (ref 8–23)
CO2: 24 mmol/L (ref 22–32)
Calcium: 8.8 mg/dL — ABNORMAL LOW (ref 8.9–10.3)
Chloride: 105 mmol/L (ref 98–111)
Creatinine, Ser: 0.86 mg/dL (ref 0.44–1.00)
GFR, Estimated: 60 mL/min — ABNORMAL LOW (ref >60)
Glucose, Bld: 85 mg/dL (ref 70–99)
Potassium: 3.8 mmol/L (ref 3.5–5.1)
Sodium: 138 mmol/L (ref 135–145)

## 2020-03-12 LAB — CBC
HCT: 36.8 % (ref 36.0–46.0)
Hemoglobin: 11.6 g/dL — ABNORMAL LOW (ref 12.0–15.0)
MCH: 30.4 pg (ref 26.0–34.0)
MCHC: 31.5 g/dL (ref 30.0–36.0)
MCV: 96.6 fL (ref 80.0–100.0)
Platelets: 164 10*3/uL (ref 150–400)
RBC: 3.81 MIL/uL — ABNORMAL LOW (ref 3.87–5.11)
RDW: 13.5 % (ref 11.5–15.5)
WBC: 6.6 10*3/uL (ref 4.0–10.5)
nRBC: 0 % (ref 0.0–0.2)

## 2020-03-12 LAB — MAGNESIUM: Magnesium: 1.7 mg/dL (ref 1.7–2.4)

## 2020-03-12 NOTE — Progress Notes (Signed)
Subjective: Less bleeding, but not resolved. Some generalized abdominal soreness  Objective: Vital signs in last 24 hours: Temp:  [97.9 F (36.6 C)-98.1 F (36.7 C)] 98.1 F (36.7 C) (02/13 0634) Pulse Rate:  [56-61] 61 (02/13 0634) Resp:  [16] 16 (02/13 0634) BP: (99-144)/(58-79) 144/79 (02/13 0634) SpO2:  [91 %-98 %] 91 % (02/13 0634) Weight:  [58.9 kg] 58.9 kg (02/12 2100) Weight change:  Last BM Date: 03/10/20  PE: GEN:  NAD ABD:  Soft, mild lower abdominal tenderness without peritonitis  Lab Results: CBC    Component Value Date/Time   WBC 6.6 03/12/2020 0627   RBC 3.81 (L) 03/12/2020 0627   HGB 11.6 (L) 03/12/2020 0627   HGB 12.9 05/28/2016 1153   HCT 36.8 03/12/2020 0627   HCT 39.1 05/28/2016 1153   PLT 164 03/12/2020 0627   PLT 208 05/28/2016 1153   MCV 96.6 03/12/2020 0627   MCV 91 05/28/2016 1153   MCH 30.4 03/12/2020 0627   MCHC 31.5 03/12/2020 0627   RDW 13.5 03/12/2020 0627   RDW 13.2 05/28/2016 1153   LYMPHSABS 2.7 03/10/2020 2210   LYMPHSABS 2.4 05/28/2016 1153   MONOABS 1.1 (H) 03/10/2020 2210   EOSABS 0.0 03/10/2020 2210   EOSABS 0.0 05/28/2016 1153   BASOSABS 0.0 03/10/2020 2210   BASOSABS 0.0 05/28/2016 1153   CMP     Component Value Date/Time   NA 138 03/12/2020 0627   NA 139 05/28/2016 1153   K 3.8 03/12/2020 0627   CL 105 03/12/2020 0627   CO2 24 03/12/2020 0627   GLUCOSE 85 03/12/2020 0627   BUN 12 03/12/2020 0627   BUN 14 05/28/2016 1153   CREATININE 0.86 03/12/2020 0627   CREATININE 1.14 (H) 12/27/2014 0852   CALCIUM 8.8 (L) 03/12/2020 0627   PROT 7.0 03/10/2020 2210   ALBUMIN 3.3 (L) 03/10/2020 2210   AST 14 (L) 03/10/2020 2210   ALT 8 03/10/2020 2210   ALKPHOS 67 03/10/2020 2210   BILITOT 0.5 03/10/2020 2210   GFRNONAA 60 (L) 03/12/2020 0627   GFRAA >60 08/10/2019 0335   Assessment:  1.  Hematochezia.  CTA negative yesterday.  Normal BUN.  Differential diagnosis diverticulosis (favored) vs hemorrhoids (has had this  type of bleeding before requiring banding).  Do not suspect UGI bleeding. 2.  Trace anemia.  Plan:  1.  Advance to soft diet. 2.  Continue hemorrhoidal suppositories. 3.  OOBTC, ambulate as tolerated.  PT eval? 4.  [facility] will follow.   Landry Dyke 03/12/2020, 10:27 AM   Cell [phone] If no answer or after 5 PM call [phone]

## 2020-03-12 NOTE — Progress Notes (Signed)
PROGRESS NOTE    [name]  PJK:[id number] DOB: [birth year] DOA: 03/10/2020 PCP: Josetta Huddle, MD   Brief Narrative:  This is  87 years old female with PMH significant for history of GI bleed, subdural hemorrhage, PVC, hypertension and CKD stage IIIa who presents from her facility with complaints of rectal bleeding.  Patient reports noticing gushing of blood out of her rectum 5 days back but decided to monitor it at home.  She also reports having some dizziness and lightheadedness,  some dyspnea with exertion.  She remains hemodynamically stable.  GI was consulted,  recommended this seems a diverticular bleed.  RBC scan is negative for active bleeding.  Assessment & Plan:   Principal Problem:   Rectal bleeding Active Problems:   CKD (chronic kidney disease) stage 3, GFR 30-59 ml/min (HCC)   HTN (hypertension)   Hematochezia   Acute rectal bleed Hemoglobin stable 11.6.    Monitor H&H. GI consulted,  Patient was seen by Dr. Paulita Fujita. Patient has history of rectal bleed in the past,  found to be due to internal hemorrhoids on Flex-sig and required banding by general surgery Keep NPO.  Placed on continuous IV fluids. GI recommended.  Hb remains stable, do not suspect upper GI bleed. Advance to soft diet, continue hemorrhoidal suppositories. Out of bed to the chair, ambulate as tolerated.   Hypertension Hold antihypertensives.  Blood pressure is on soft side. Will resume tomorrow once blood pressure improves.  CKD stage IIIa Creatinine stable.  Avoid nephrotoxic agent     DVT prophylaxis: SCDs Code Status: DNR Family Communication: No family at bed side. Disposition Plan:  Status is: Inpatient  Remains inpatient appropriate because:Inpatient level of care appropriate due to severity of illness   Dispo:  Patient From: [place]  Planned Disposition: [place]  Expected discharge date: 03/13/2020  Medically stable for discharge:  No     Consultants:   Gastroenterology  Procedures:  None Antimicrobials:  Anti-infectives (From admission, onward)   None     Subjective: Patient was seen and examined at bedside.  overnight events noted.  She reports feeling better, denies any further bleeding today.  she also reports some abdominal soreness.  she feels hungry and wants to eat.  asks when she can be discharged.  Objective: Vitals:   03/11/20 2020 03/11/20 2100 03/12/20 0634 03/12/20 1229  BP: (!) 125/58  (!) 144/79 (!) 123/58  Pulse: (!) 57  61 65  Resp: 16  16 18   Temp: 97.9 F (36.6 C)  98.1 F (36.7 C) 98.3 F (36.8 C)  TempSrc: Oral  Oral Oral  SpO2: 93%  91% 97%  Weight:  58.9 kg    Height:  5\' 6"  (1.676 m)      Intake/Output Summary (Last 24 hours) at 03/12/2020 1600 Last data filed at 03/12/2020 1233 Gross per 24 hour  Intake 420 ml  Output --  Net 420 ml   Filed Weights   03/11/20 2100  Weight: 58.9 kg    Examination:  General exam: Appears calm and comfortable, not in any dostress  Respiratory system: Clear to auscultation. Respiratory effort normal. Cardiovascular system: S1 & S2 heard, RRR. No JVD, murmurs, rubs, gallops or clicks. No pedal edema. Gastrointestinal system: Abdomen is nondistended, soft and nontender. No organomegaly or masses felt. Normal bowel sounds heard. Central nervous system: Alert and oriented. No focal neurological deficits. Extremities: Symmetric 5 x 5 power. No edema, no cyanosis, no clubbing. Skin: No rashes, lesions or  ulcers Psychiatry: Judgement and insight appear normal. Mood & affect appropriate.     Data Reviewed: I have personally reviewed following labs and imaging studies  CBC: Recent Labs  Lab 03/10/20 2210 03/11/20 0633 03/11/20 1415 03/11/20 2037 03/12/20 0627  WBC 8.9 8.3  --  7.2 6.6  NEUTROABS 5.0  --   --   --   --   HGB 11.4* 11.0* 16.6* 10.9* 11.6*  HCT 35.8* 34.4* 50.1* 34.6* 36.8  MCV 96.2 96.9  --  96.9 96.6  PLT 178 156   --  149* 240   Basic Metabolic Panel: Recent Labs  Lab 03/10/20 2210 03/11/20 0633 03/12/20 0627  NA 140 141 138  K 3.8 3.5 3.8  CL 104 107 105  CO2 25 24 24   GLUCOSE 116* 97 85  BUN 20 18 12   CREATININE 0.98 0.78 0.86  CALCIUM 8.9 8.6* 8.8*  MG  --   --  1.7   GFR: Estimated Creatinine Clearance: 31.5 mL/min (by C-G formula based on SCr of 0.86 mg/dL). Liver Function Tests: Recent Labs  Lab 03/10/20 2210  AST 14*  ALT 8  ALKPHOS 67  BILITOT 0.5  PROT 7.0  ALBUMIN 3.3*   No results for input(s): LIPASE, AMYLASE in the last 168 hours. No results for input(s): AMMONIA in the last 168 hours. Coagulation Profile: No results for input(s): INR, PROTIME in the last 168 hours. Cardiac Enzymes: No results for input(s): CKTOTAL, CKMB, CKMBINDEX, TROPONINI in the last 168 hours. BNP (last 3 results) No results for input(s): PROBNP in the last 8760 hours. HbA1C: No results for input(s): HGBA1C in the last 72 hours. CBG: No results for input(s): GLUCAP in the last 168 hours. Lipid Profile: No results for input(s): CHOL, HDL, LDLCALC, TRIG, CHOLHDL, LDLDIRECT in the last 72 hours. Thyroid Function Tests: No results for input(s): TSH, T4TOTAL, FREET4, T3FREE, THYROIDAB in the last 72 hours. Anemia Panel: No results for input(s): VITAMINB12, FOLATE, FERRITIN, TIBC, IRON, RETICCTPCT in the last 72 hours. Sepsis Labs: No results for input(s): PROCALCITON, LATICACIDVEN in the last 168 hours.  Recent Results (from the past 240 hour(s))  Resp Panel by RT-PCR (Flu A&B, Covid) Nasopharyngeal Swab     Status: None   Collection Time: 03/10/20 11:09 PM   Specimen: Nasopharyngeal Swab; Nasopharyngeal(NP) swabs in vial transport medium  Result Value Ref Range Status   SARS Coronavirus 2 by RT PCR NEGATIVE NEGATIVE Final    Comment: (NOTE) SARS-CoV-2 target nucleic acids are NOT DETECTED.  The SARS-CoV-2 RNA is generally detectable in upper respiratory specimens during the acute  phase of infection. The lowest concentration of SARS-CoV-2 viral copies this assay can detect is 138 copies/mL. A negative result does not preclude SARS-Cov-2 infection and should not be used as the sole basis for treatment or other patient management decisions. A negative result may occur with  improper specimen collection/handling, submission of specimen other than nasopharyngeal swab, presence of viral mutation(s) within the areas targeted by this assay, and inadequate number of viral copies(<138 copies/mL). A negative result must be combined with clinical observations, patient history, and epidemiological information. The expected result is Negative.  Fact Sheet for Patients:  EntrepreneurPulse.com.au  Fact Sheet for Healthcare Providers:  IncredibleEmployment.be  This test is no t yet approved or cleared by the [place] FDA and  has been authorized for detection and/or diagnosis of SARS-CoV-2 by FDA under an Emergency Use Authorization (EUA). This EUA will remain  in effect (meaning this test can be  used) for the duration of the COVID-19 declaration under Section 564(b)(1) of the Act, 21 U.S.C.section 360bbb-3(b)(1), unless the authorization is terminated  or revoked sooner.       Influenza A by PCR NEGATIVE NEGATIVE Final   Influenza B by PCR NEGATIVE NEGATIVE Final    Comment: (NOTE) The Xpert Xpress SARS-CoV-2/FLU/RSV plus assay is intended as an aid in the diagnosis of influenza from Nasopharyngeal swab specimens and should not be used as a sole basis for treatment. Nasal washings and aspirates are unacceptable for Xpert Xpress SARS-CoV-2/FLU/RSV testing.  Fact Sheet for Patients: EntrepreneurPulse.com.au  Fact Sheet for Healthcare Providers: IncredibleEmployment.be  This test is not yet approved or cleared by the [place] FDA and has been authorized for detection and/or diagnosis of  SARS-CoV-2 by FDA under an Emergency Use Authorization (EUA). This EUA will remain in effect (meaning this test can be used) for the duration of the COVID-19 declaration under Section 564(b)(1) of the Act, 21 U.S.C. section 360bbb-3(b)(1), unless the authorization is terminated or revoked.  Performed at [facility], [place] [address], [place], [place] [address]     Radiology Studies: CT Angio Pelvis W and/or Wo Contrast  Result Date: 03/11/2020 CLINICAL DATA:  Gastrointestinal bleeding. EXAM: CT ANGIOGRAPHY ABDOMEN and pelvis TECHNIQUE: Multidetector CT imaging of the abdomen was performed using the standard protocol during bolus administration of intravenous contrast. Multiplanar reconstructed images and MIPs were obtained and reviewed to evaluate the vascular anatomy. CONTRAST:  127mL OMNIPAQUE IOHEXOL 350 MG/ML SOLN COMPARISON:  CT abdomen 08/20/2017 FINDINGS: Mild atelectasis at the lung bases.  Large hiatal hernia is present. No liver parenchymal lesion is seen. No calcified gallstones. No stone in the common duct. Pancreas appears normal. Spleen is normal. Adrenal glands are normal. Kidneys are slightly small but do not show any focal lesion or obstruction. Bladder is normal. Hiatal hernia as noted above. No evidence of bleeding within the stomach. Small bowel appears normal without evidence of obstruction. No identifiable small-bowel bleeding. Colon contains a moderate amount of gas and stool. There is diverticulosis. No sign of arterial bleeding in the colon. No free fluid or air. Scoliotic curvature of the spine with pronounced chronic degenerative changes. The aorta is markedly tortuous. There is aortic atherosclerotic calcification but no aneurysm. Moderate proximal stenosis affecting the celiac artery. Superior mesenteric artery is widely patent. Inferior mesenteric artery is widely patent. Both renal arteries are widely patent. Common iliac arteries show atherosclerotic  calcification but no aneurysm or stenosis. Internal iliac arteries and external iliac arteries are patent. Common femoral arteries are patent. IMPRESSION: 1. No evidence of arterial bleeding in the intestine. 2. Large hiatal hernia. 3. Diverticulosis without evidence of diverticulitis. 4. Atherosclerotic disease of the aorta and its branch vessels. 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 Aortic Atherosclerosis (ICD10-I70.0). Electronically Signed   By: Nelson Chimes M.D.   On: 03/11/2020 00:22   CT Angio Abdomen W and/or Wo Contrast  Result Date: 03/11/2020 CLINICAL DATA:  Gastrointestinal bleeding. EXAM: CT ANGIOGRAPHY ABDOMEN and pelvis TECHNIQUE: Multidetector CT imaging of the abdomen was performed using the standard protocol during bolus administration of intravenous contrast. Multiplanar reconstructed images and MIPs were obtained and reviewed to evaluate the vascular anatomy. CONTRAST:  1100mL OMNIPAQUE IOHEXOL 350 MG/ML SOLN COMPARISON:  CT abdomen 08/20/2017 FINDINGS: Mild atelectasis at the lung bases.  Large hiatal hernia is present. No liver parenchymal lesion is seen. No calcified gallstones. No stone in the common duct. Pancreas appears normal. Spleen is normal.  Adrenal glands are normal. Kidneys are slightly small but do not show any focal lesion or obstruction. Bladder is normal. Hiatal hernia as noted above. No evidence of bleeding within the stomach. Small bowel appears normal without evidence of obstruction. No identifiable small-bowel bleeding. Colon contains a moderate amount of gas and stool. There is diverticulosis. No sign of arterial bleeding in the colon. No free fluid or air. Scoliotic curvature of the spine with pronounced chronic degenerative changes. The aorta is markedly tortuous. There is aortic atherosclerotic calcification but no aneurysm. Moderate proximal stenosis affecting the celiac artery. Superior mesenteric artery is widely patent. Inferior mesenteric artery is  widely patent. Both renal arteries are widely patent. Common iliac arteries show atherosclerotic calcification but no aneurysm or stenosis. Internal iliac arteries and external iliac arteries are patent. Common femoral arteries are patent. IMPRESSION: 1. No evidence of arterial bleeding in the intestine. 2. Large hiatal hernia. 3. Diverticulosis without evidence of diverticulitis. 4. Atherosclerotic disease of the aorta and its branch vessels. 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 Aortic Atherosclerosis (ICD10-I70.0). Electronically Signed   By: Nelson Chimes M.D.   On: 03/11/2020 00:22    Scheduled Meds: . hydrocortisone  25 mg Rectal BID  . [START ON 03/13/2020] pantoprazole  40 mg Oral Q M,W,F  . PARoxetine  10 mg Oral Daily  . potassium chloride SA  20 mEq Oral Daily   Continuous Infusions:   LOS: 1 day    Time spent: 25 mins    Shawna Clamp, MD [facility]   If 7PM-7AM, please contact night-coverage

## 2020-03-13 DIAGNOSIS — K625 Hemorrhage of anus and rectum: Secondary | ICD-10-CM | POA: Diagnosis not present

## 2020-03-13 LAB — CBC
HCT: 38 % (ref 36.0–46.0)
Hemoglobin: 12 g/dL (ref 12.0–15.0)
MCH: 31 pg (ref 26.0–34.0)
MCHC: 31.6 g/dL (ref 30.0–36.0)
MCV: 98.2 fL (ref 80.0–100.0)
Platelets: 165 10*3/uL (ref 150–400)
RBC: 3.87 MIL/uL (ref 3.87–5.11)
RDW: 13.6 % (ref 11.5–15.5)
WBC: 6.5 10*3/uL (ref 4.0–10.5)
nRBC: 0 % (ref 0.0–0.2)

## 2020-03-13 LAB — PHOSPHORUS: Phosphorus: 2.6 mg/dL (ref 2.5–4.6)

## 2020-03-13 LAB — BASIC METABOLIC PANEL
Anion gap: 10 (ref 5–15)
BUN: 12 mg/dL (ref 8–23)
CO2: 25 mmol/L (ref 22–32)
Calcium: 9 mg/dL (ref 8.9–10.3)
Chloride: 104 mmol/L (ref 98–111)
Creatinine, Ser: 0.85 mg/dL (ref 0.44–1.00)
GFR, Estimated: >60 mL/min (ref >60)
Glucose, Bld: 81 mg/dL (ref 70–99)
Potassium: 3.7 mmol/L (ref 3.5–5.1)
Sodium: 139 mmol/L (ref 135–145)

## 2020-03-13 LAB — MAGNESIUM: Magnesium: 1.8 mg/dL (ref 1.7–2.4)

## 2020-03-13 NOTE — Progress Notes (Addendum)
PROGRESS NOTE    [name]  KPT:[id number] DOB: 12/02/1916 DOA: 03/10/2020 PCP: Josetta Huddle, MD   Brief Narrative:  This is 87 years old female with PMH significant for history of GI bleed, subdural hemorrhage, PVC, hypertension and CKD stage IIIa who presents from her facility with complaints of rectal bleeding.  Patient reports noticing gushing of blood out of her rectum 5 days back but decided to monitor it at home.  She also reports having some dizziness and lightheadedness,  some dyspnea with exertion.  She remains hemodynamically stable.  GI was consulted,  recommended this seems a diverticular bleed.  RBC scan is negative for active bleeding. GI signed off.  Assessment & Plan:   Principal Problem:   Rectal bleeding Active Problems:   CKD (chronic kidney disease) stage 3, GFR 30-59 ml/min (HCC)   HTN (hypertension)   Hematochezia   Acute rectal bleed>>> Resolved Hemoglobin stable 11.6 >> 12.0.    Monitor H&H. GI consulted,  Patient was seen by Dr. Paulita Fujita. Patient has history of rectal bleed in the past,  found to be due to internal hemorrhoids on Flex-sig and required banding by general surgery Kept NPO initially.  Placed on continuous IV fluids. GI recommended.  Hb remains stable, do not suspect upper GI bleed. Advance to soft diet, continue hemorrhoidal suppositories. Out of bed to the chair, ambulate as tolerated. GI signed off.  Outpatient GI follow-up.   Hypertension Hold antihypertensives.  Blood pressure is on soft side. Will resume tomorrow once blood pressure improves.  CKD stage IIIa Creatinine stable.  Avoid nephrotoxic agent     DVT prophylaxis: SCDs Code Status: DNR Family Communication: No family at bed side. Disposition Plan:  Status is: Inpatient  Remains inpatient appropriate because:Inpatient level of care appropriate due to severity of illness   Dispo:  Patient From: [place]  Planned Disposition:  SNF  Expected discharge date: 03/14/2020  Medically stable for discharge: No     Consultants:   Gastroenterology  Procedures:  None Antimicrobials:  Anti-infectives (From admission, onward)   None     Subjective: Patient was seen and examined at bedside.  overnight events noted.  She reports feeling better, denies any further bleeding .She reports abdominal pain has resolved.  She has tolerated soft diet. Objective: Vitals:   03/12/20 2201 03/13/20 0646 03/13/20 1310 03/13/20 1419  BP: 129/61 103/84 117/67 116/67  Pulse: 64 62 82 78  Resp: 16 14 15    Temp: 98 F (36.7 C) 97.8 F (36.6 C) 98.6 F (37 C)   TempSrc: Oral Oral Oral   SpO2: 97% 96% 98% 100%  Weight:      Height:        Intake/Output Summary (Last 24 hours) at 03/13/2020 1514 Last data filed at 03/13/2020 0900 Gross per 24 hour  Intake 340 ml  Output --  Net 340 ml   Filed Weights   03/11/20 2100  Weight: 58.9 kg    Examination:  General exam: Appears calm and comfortable, not in any dostress  Respiratory system: Clear to auscultation. Respiratory effort normal. Cardiovascular system: S1 & S2 heard, RRR. No JVD, murmurs, rubs, gallops or clicks. No pedal edema. Gastrointestinal system: Abdomen is nondistended, soft and nontender. No organomegaly or masses felt. Normal bowel sounds heard. Central nervous system: Alert and oriented. No focal neurological deficits. Extremities: Symmetric 5 x 5 power. No edema, no cyanosis, no clubbing. Skin: No rashes, lesions or ulcers Psychiatry: Judgement and insight appear normal. Mood &  affect appropriate.     Data Reviewed: I have personally reviewed following labs and imaging studies  CBC: Recent Labs  Lab 03/10/20 2210 03/11/20 0633 03/11/20 1415 03/11/20 2037 03/12/20 0627 03/13/20 0708  WBC 8.9 8.3  --  7.2 6.6 6.5  NEUTROABS 5.0  --   --   --   --   --   HGB 11.4* 11.0* 16.6* 10.9* 11.6* 12.0  HCT 35.8* 34.4* 50.1* 34.6* 36.8 38.0  MCV 96.2 96.9   --  96.9 96.6 98.2  PLT 178 156  --  149* 164 308   Basic Metabolic Panel: Recent Labs  Lab 03/10/20 2210 03/11/20 0633 03/12/20 0627 03/13/20 0708  NA 140 141 138 139  K 3.8 3.5 3.8 3.7  CL 104 107 105 104  CO2 25 24 24 25   GLUCOSE 116* 97 85 81  BUN 20 18 12 12   CREATININE 0.98 0.78 0.86 0.85  CALCIUM 8.9 8.6* 8.8* 9.0  MG  --   --  1.7 1.8  PHOS  --   --   --  2.6   GFR: Estimated Creatinine Clearance: 31.9 mL/min (by C-G formula based on SCr of 0.85 mg/dL). Liver Function Tests: Recent Labs  Lab 03/10/20 2210  AST 14*  ALT 8  ALKPHOS 67  BILITOT 0.5  PROT 7.0  ALBUMIN 3.3*   No results for input(s): LIPASE, AMYLASE in the last 168 hours. No results for input(s): AMMONIA in the last 168 hours. Coagulation Profile: No results for input(s): INR, PROTIME in the last 168 hours. Cardiac Enzymes: No results for input(s): CKTOTAL, CKMB, CKMBINDEX, TROPONINI in the last 168 hours. BNP (last 3 results) No results for input(s): PROBNP in the last 8760 hours. HbA1C: No results for input(s): HGBA1C in the last 72 hours. CBG: No results for input(s): GLUCAP in the last 168 hours. Lipid Profile: No results for input(s): CHOL, HDL, LDLCALC, TRIG, CHOLHDL, LDLDIRECT in the last 72 hours. Thyroid Function Tests: No results for input(s): TSH, T4TOTAL, FREET4, T3FREE, THYROIDAB in the last 72 hours. Anemia Panel: No results for input(s): VITAMINB12, FOLATE, FERRITIN, TIBC, IRON, RETICCTPCT in the last 72 hours. Sepsis Labs: No results for input(s): PROCALCITON, LATICACIDVEN in the last 168 hours.  Recent Results (from the past 240 hour(s))  Resp Panel by RT-PCR (Flu A&B, Covid) Nasopharyngeal Swab     Status: None   Collection Time: 03/10/20 11:09 PM   Specimen: Nasopharyngeal Swab; Nasopharyngeal(NP) swabs in vial transport medium  Result Value Ref Range Status   SARS Coronavirus 2 by RT PCR NEGATIVE NEGATIVE Final    Comment: (NOTE) SARS-CoV-2 target nucleic acids are  NOT DETECTED.  The SARS-CoV-2 RNA is generally detectable in upper respiratory specimens during the acute phase of infection. The lowest concentration of SARS-CoV-2 viral copies this assay can detect is 138 copies/mL. A negative result does not preclude SARS-Cov-2 infection and should not be used as the sole basis for treatment or other patient management decisions. A negative result may occur with  improper specimen collection/handling, submission of specimen other than nasopharyngeal swab, presence of viral mutation(s) within the areas targeted by this assay, and inadequate number of viral copies(<138 copies/mL). A negative result must be combined with clinical observations, patient history, and epidemiological information. The expected result is Negative.  Fact Sheet for Patients:  EntrepreneurPulse.com.au  Fact Sheet for Healthcare Providers:  IncredibleEmployment.be  This test is no t yet approved or cleared by the [place] FDA and  has been authorized for detection  and/or diagnosis of SARS-CoV-2 by FDA under an Emergency Use Authorization (EUA). This EUA will remain  in effect (meaning this test can be used) for the duration of the COVID-19 declaration under Section 564(b)(1) of the Act, 21 U.S.C.section 360bbb-3(b)(1), unless the authorization is terminated  or revoked sooner.       Influenza A by PCR NEGATIVE NEGATIVE Final   Influenza B by PCR NEGATIVE NEGATIVE Final    Comment: (NOTE) The Xpert Xpress SARS-CoV-2/FLU/RSV plus assay is intended as an aid in the diagnosis of influenza from Nasopharyngeal swab specimens and should not be used as a sole basis for treatment. Nasal washings and aspirates are unacceptable for Xpert Xpress SARS-CoV-2/FLU/RSV testing.  Fact Sheet for Patients: EntrepreneurPulse.com.au  Fact Sheet for Healthcare Providers: IncredibleEmployment.be  This test is not  yet approved or cleared by the [place] FDA and has been authorized for detection and/or diagnosis of SARS-CoV-2 by FDA under an Emergency Use Authorization (EUA). This EUA will remain in effect (meaning this test can be used) for the duration of the COVID-19 declaration under Section 564(b)(1) of the Act, 21 U.S.C. section 360bbb-3(b)(1), unless the authorization is terminated or revoked.  Performed at [facility], [place] [address] [place], [place] [address]     Radiology Studies: No results found.  Scheduled Meds: . hydrocortisone  25 mg Rectal BID  . pantoprazole  40 mg Oral Q M,W,F  . PARoxetine  10 mg Oral Daily  . potassium chloride SA  20 mEq Oral Daily   Continuous Infusions:   LOS: 2 days    Time spent: 25 mins    Shawna Clamp, MD [facility]   If 7PM-7AM, please contact night-coverage

## 2020-03-13 NOTE — TOC Initial Note (Signed)
Transition of Care [facility]) - Initial/Assessment Note    Patient Details  Name: [name] MRN: [id number] Date of Birth: [birth year]  Transition of Care [facility]) CM/SW Contact:    Shade Flood, LCSW Phone Number: 03/13/2020, 4:01 PM  Clinical Narrative:                  Pt admitted from home. She resides in her own apartment at [facility]. OT recommending HH at dc, PT recommending SNF. Met with pt and her niece/POA at bedside to assess. Discussed therapy recommendations and pt and her niece state that their preference is for pt to return to [place] at [facility]. They state that pt was receiving PT and OT from their in house therapists and that they would like to resume this at dc. Pt's niece states that she will be able to transport pt home at dc. MD anticipating dc tomorrow.  Assigned TOC will follow up in AM.   Expected Discharge Plan: [place] Barriers to Discharge: Continued Medical Work up   Patient Goals and CMS Choice Patient states their goals for this hospitalization and ongoing recovery are:: go home      Expected Discharge Plan and Services Expected Discharge Plan: [place] In-house Referral: Clinical Social Work   Post Acute Care Choice: Resumption of Svcs/PTA Provider Living arrangements for the past 2 months: Apartment                                      Prior Living Arrangements/Services Living arrangements for the past 2 months: Apartment Lives with:: Self Patient language and need for interpreter reviewed:: Yes Do you feel safe going back to the place where you live?: Yes      Need for Family Participation in Patient Care: No (Comment) Care giver support system in place?: Yes (comment) Current home services: DME,Home OT,Home PT Criminal Activity/Legal Involvement Pertinent to Current Situation/Hospitalization: No - Comment as needed  Activities of Daily Living Home Assistive Devices/Equipment:  Hearing aid,Dentures (specify type),Walker (specify type) (upper and lower dentures, rollator (at home ), glasses) ADL Screening (condition at time of admission) Patient's cognitive ability adequate to safely complete daily activities?: Yes Is the patient deaf or have difficulty hearing?: Yes Does the patient have difficulty seeing, even when wearing glasses/contacts?: No Does the patient have difficulty concentrating, remembering, or making decisions?: No Patient able to express need for assistance with ADLs?: Yes Does the patient have difficulty dressing or bathing?: Yes Independently performs ADLs?: Yes (appropriate for developmental age) Does the patient have difficulty walking or climbing stairs?: Yes Weakness of Legs: None Weakness of Arms/Hands: Right  Permission Sought/Granted                  Emotional Assessment Appearance:: Appears younger than stated age Attitude/Demeanor/Rapport: Engaged Affect (typically observed): Pleasant Orientation: : Oriented to Self,Oriented to Place,Oriented to  Time,Oriented to Situation Alcohol / Substance Use: Not Applicable Psych Involvement: No (comment)  Admission diagnosis:  Rectal bleeding [K62.5] Acute GI bleeding [K92.2] Hematochezia [K92.1] Patient Active Problem List   Diagnosis Date Noted  . Rectal bleeding 03/11/2020  . Hematochezia 03/11/2020  . Orthostatic hypotension 08/13/2019  . Falls, sequela 08/13/2019  . DNR (do not resuscitate) 08/11/2019  . SDH (subdural hematoma) ([place]) 08/09/2019  . Acute lower UTI 01/02/2018  . Acute cystitis without hematuria   . Acute encephalopathy 01/01/2018  .  DJD (degenerative joint disease) 08/09/2016  . GI bleed 08/08/2016  . Abnormal echocardiogram 06/14/2016  . PVC's (premature ventricular contractions)   . Bacteremia 09/18/2014  . Fever   . Febrile illness 09/12/2014  . Diverticulitis 09/12/2014  . CKD (chronic kidney disease) stage 3, GFR 30-59 ml/min (HCC) 09/12/2014  .  HTN (hypertension) 09/12/2014  . Thrombocytopenia ([place]) 09/12/2014  . Abnormal stress test 01/13/2014  . Rectal bleed 08/10/2013  . Acute renal failure ([place]) 08/10/2013  . Essential hypertension, benign 01/08/2013  . DOE (dyspnea on exertion) 01/08/2013   PCP:  Josetta Huddle, MD Pharmacy:   [facility] [phone] [place], [place] [address] [place] [address] Phone: [phone] Fax: [phone]     Social Determinants of Health ([place]) Interventions    Readmission Risk Interventions Readmission Risk Prevention Plan 03/13/2020  Medication Screening Complete  Transportation Screening Complete  Some recent data might be hidden

## 2020-03-13 NOTE — Evaluation (Signed)
Physical Therapy Evaluation Patient Details Name: [name] MRN: [id number] DOB: [birth year] Today's Date: 03/13/2020   History of Present Illness  87 y.o. female with history h/o CAD, CHF, CKD stage III, diverticulosis, hypertension/hyperlipidemia, pulmonary HTN, vertigo and depression. Pt admitted for rectal bleed.  Clinical Impression  Pt admitted with above diagnosis.  Pt currently with functional limitations due to the deficits listed below (see PT Problem List). Pt will benefit from skilled PT to increase their independence and safety with mobility to allow discharge to the venue listed below.  .Pt reports mildly blurry vision upon entering room however improved with time (vitals below).  Pt assisted with ambulating short distance and using bathroom.  Pt requiring at least min assist at this time and would benefit from SNF upon d/c unless ALF can provide current level of care.  Pt pleasant and motivated.   03/13/20 1419  Vital Signs  Pulse Rate 78  Pulse Rate Source Monitor  BP 116/67  BP Location Right Arm  BP Method Automatic  Patient Position (if appropriate) Lying  Oxygen Therapy  SpO2 100 %  O2 Device Room Air       Follow Up Recommendations SNF    Equipment Recommendations  None recommended by PT    Recommendations for Other Services       Precautions / Restrictions Precautions Precautions: Fall Restrictions Weight Bearing Restrictions: No      Mobility  Bed Mobility Overal bed mobility: Modified Independent             General bed mobility comments: Pt has hospital bed at [facility]. Able to demo supine to sit with HOB partially elevated and use of bed rail: Mod I.    Transfers Overall transfer level: Needs assistance Equipment used: Rolling walker (2 wheeled) Transfers: Sit to/from [facility] Sit to Stand: Min assist Stand pivot transfers: Min guard       General transfer comment: assist to rise and steady, pt  reliant on UE assist  Ambulation/Gait Ambulation/Gait assistance: Min assist Gait Distance (Feet): 80 Feet Assistive device: Rolling walker (2 wheeled) Gait Pattern/deviations: Step-through pattern;Decreased stride length;Trunk flexed     General Gait Details: verbal cues for RW positioning and posture, slow but steady with RW, occasional assist for [employer]            Wheelchair Mobility    Modified Rankin (Stroke Patients Only)       Balance Overall balance assessment: History of Falls Sitting-balance support: No upper extremity supported;Feet supported Sitting balance-Leahy Scale: Good       Standing balance-Leahy Scale: Poor Standing balance comment: Initial stand with LOB and need of Min As to correct with BUE support on RW.                             Pertinent Vitals/Pain Pain Assessment: No/denies pain Faces Pain Scale: Hurts little more Pain Location: RT elbow/UE.  Pt does not recall banging or falling on RUE. Pt reports pain is new in past couple of days. Pain Descriptors / Indicators: Aching Pain Intervention(s): Repositioned;Monitored during session    Home Living Family/patient expects to be discharged to:: Assisted living               Home Equipment: Walker - 4 wheels;Shower seat;Grab bars - tub/shower;Walker - 2 wheels;Hospital bed      Prior Function Level of Independence: Needs assistance   Gait / Transfers Assistance  Needed: uses rollator for ambulation.  Had fall and SDH in July 2021. Pt denies any subsequent falls.  ADL's / Homemaking Assistance Needed: Pt reports staff assists with LE dressing as needed. Pt reports that she bathes and toilets herself and manages her own medication. Dining hall is significant distance so pt does have meals brought to room at times.  U[facility] and is active with their in-house PT and OT.        Hand Dominance   Dominant Hand:  Right    Extremity/Trunk Assessment   Upper Extremity Assessment Upper Extremity Assessment: RUE deficits/detail;LUE deficits/detail RUE Deficits / Details: Shoulder AROM to ~105. Decreased IR and ER. Eblow-->Wrist WFL, but with facial grimace. MMT NT due to pain except grip: 3+/5 RUE Sensation: WNL RUE Coordination: WNL LUE Deficits / Details: AROM: WFL.  MMT: grossly 3+/5 LUE Coordination: WNL    Lower Extremity Assessment Lower Extremity Assessment: Generalized weakness    Cervical / Trunk Assessment Cervical / Trunk Assessment: Kyphotic  Communication   Communication: HOH;No difficulties (Wearing hearing aids)  Cognition Arousal/Alertness: Awake/alert Behavior During Therapy: WFL for tasks assessed/performed Overall Cognitive Status: Within Functional Limits for tasks assessed                                 General Comments: orientated to person and place, unable to recall month      General Comments      Exercises     Assessment/Plan    PT Assessment Patient needs continued PT services  PT Problem List Decreased strength;Decreased mobility;Decreased balance;Decreased knowledge of use of DME;Decreased activity tolerance;Decreased skin integrity       PT Treatment Interventions DME instruction;Gait training;Balance training;Therapeutic activities;Patient/family education;Functional mobility training;Therapeutic exercise    PT Goals (Current goals can be found in the Care Plan section)  Acute Rehab PT Goals Patient Stated Goal: To go home today. PT Goal Formulation: With patient Time For Goal Achievement: 03/27/20 Potential to Achieve Goals: Good    Frequency Min 2X/week   Barriers to discharge        Co-evaluation               AM-PAC PT "6 Clicks" Mobility  Outcome Measure Help needed turning from your back to your side while in a flat bed without using bedrails?: A Little Help needed moving from lying on your back to sitting on  the side of a flat bed without using bedrails?: A Little Help needed moving to and from a bed to a chair (including a wheelchair)?: A Little Help needed standing up from a chair using your arms (e.g., wheelchair or bedside chair)?: A Little Help needed to walk in hospital room?: A Little Help needed climbing 3-5 steps with a railing? : A Lot 6 Click Score: 17    End of Session Equipment Utilized During Treatment: Gait belt Activity Tolerance: Patient limited by fatigue Patient left: with call bell/phone within reach;in bed;with bed alarm set Nurse Communication: Mobility status PT Visit Diagnosis: Other abnormalities of gait and mobility (R26.89)    Time: 1411-1446 PT Time Calculation (min) (ACUTE ONLY): 35 min   Charges:   PT Evaluation $PT Eval Low Complexity: 1 Low PT Treatments $Gait Training: 8-22 mins       Jannette Spanner PT, DPT Acute Rehabilitation Services Pager: [phone] Office: [phone]  York Ram E 03/13/2020, 2:59 PM

## 2020-03-13 NOTE — Progress Notes (Signed)
[facility] Gastroenterology Progress Note  [name] 87 y.o. 1916-12-04  CC:  Hematochezia  Subjective: Patient reports periumbilical abdominal pain.  Denies nausea/vomiting.  Patient states she does not want any procedures completed for bleeding.  Per RN, patient had a scant amount of dried blood in her brief but has not had a BM today.  Last documented stool 2/13, though color not documented.    ROS : Review of Systems  Constitutional: Positive for malaise/fatigue. Negative for chills and fever.  Gastrointestinal: Positive for abdominal pain and blood in stool. Negative for constipation, diarrhea, heartburn, melena, nausea and vomiting.   Objective: Vital signs in last 24 hours: Vitals:   03/12/20 2201 03/13/20 0646  BP: 129/61 103/84  Pulse: 64 62  Resp: 16 14  Temp: 98 F (36.7 C) 97.8 F (36.6 C)  SpO2: 97% 96%    Physical Exam:  General:  Lethargic, elderly, oriented, cooperative, no distress  Head:  Normocephalic, without obvious abnormality, atraumatic  Eyes:  Anicteric sclera, EOMs intact   Lungs:   Clear to auscultation bilaterally, respirations unlabored  Heart:  Regular rate and rhythm, S1, S2 normal  Abdomen:   Soft and non-distended with mild periumbilical tenderness to palpation; bowel sounds active all four quadrants,  no peritoneal signs  Extremities: Extremities normal, atraumatic, no  edema  Pulses: 2+ and symmetric    Lab Results: Recent Labs    03/12/20 0627 03/13/20 0708  NA 138 139  K 3.8 3.7  CL 105 104  CO2 24 25  GLUCOSE 85 81  BUN 12 12  CREATININE 0.86 0.85  CALCIUM 8.8* 9.0  MG 1.7 1.8  PHOS  --  2.6   Recent Labs    03/10/20 2210  AST 14*  ALT 8  ALKPHOS 67  BILITOT 0.5  PROT 7.0  ALBUMIN 3.3*   Recent Labs    03/10/20 2210 03/11/20 0633 03/12/20 0627 03/13/20 0708  WBC 8.9   < > 6.6 6.5  NEUTROABS 5.0  --   --   --   HGB 11.4*   < > 11.6* 12.0  HCT 35.8*   < > 36.8 38.0  MCV 96.2   < > 96.6 98.2  PLT 178    < > 164 165   < > = values in this interval not displayed.   No results for input(s): LABPROT, INR in the last 72 hours.    Assessment: Rectal bleeding: diverticular vs. Hemorrhoidal -Hgb 12.0, stable -CT-A 03/10/20: diverticulosis, hiatal hernia; no active bleeding noted  Plan: Continue hydrocortisone suppositories BID x 2 weeks.  OK to discharge from a GI standpoint.  Recommend FU with PCP in 1-2 weeks for repeat CBC.  [facility] will sign off. Please contact us if we can be of any further assistance during this hospital stay.  Salley Slaughter PA-C 03/13/2020, 9:17 AM  Contact #  [phone]

## 2020-03-13 NOTE — Evaluation (Signed)
Occupational Therapy Evaluation Patient Details Name: [name] MRN: [id number] DOB: January 04, 1917 Today's Date: 03/13/2020    History of Present Illness 87 y.o. female with history h/o CAD, CHF, CKD stage III, diverticulosis, hypertension/hyperlipidemia, pulmonary HTN, vertigo and depression. Admitted for recetal bleed.   Clinical Impression   Patient is currently requiring assistance with ADLs including Min Assist with toileting, Min As with LE dressing (baseline), Min assist with bathing, and full setup with seated grooming and UE dressing, all of which is below patient's typical baseline of being Modified independent with all but donning socks and shoes for which she receives staff help.  During this evaluation, patient was limited by RT UE pain of unknown origin, unsteadiness with initial stand with need of external support to regain balance, and generalized weakness, which has the potential to impact patient's safety and independence during functional mobility, as well as performance for ADLs. [place] "6-clicks" Daily Activity Inpatient Short Form score of 19/24 indicates 42.80% ADL impairment this session. Patient lives at [facility] ALF/[facility] with 24/7 supervision and assistance, but not contact close supervision as pt has her own apartment.  Patient demonstrates good rehab potential, and should benefit from continued skilled occupational therapy services while in acute care to maximize safety, independence and quality of life at home.  Continued occupational therapy services in [place] where pt is already active with PT and OT is recommended.  ?   Follow Up Recommendations  Home health OT;Other (comment) (Resume in-house PT and OT at [facility])    Equipment Recommendations       Recommendations for Other Services       Precautions / Restrictions Precautions Precautions: Fall Restrictions Weight Bearing Restrictions: No      Mobility  Bed Mobility Overal bed mobility: Modified Independent             General bed mobility comments: Pt has hospital bed at [facility]. Able to demo supine to sit with HOB partially elevated and use of bed rail: Mod I.    Transfers Overall transfer level: Needs assistance Equipment used: Rolling walker (2 wheeled) Transfers: Sit to/from [facility] Sit to Stand: Min assist Stand pivot transfers: Min guard       General transfer comment: Min As for stand to sit in recliner due to inability to follow cues for UE reach back with subsequent decreased eccentric control. Pt is used to holding onto her Rollator to lower herself.    Balance Overall balance assessment: History of Falls;Needs assistance Sitting-balance support: No upper extremity supported;Feet supported Sitting balance-Leahy Scale: Good       Standing balance-Leahy Scale: Poor Standing balance comment: Initial stand with LOB and need of Min As to correct with BUE support on RW.                           ADL either performed or assessed with clinical judgement   ADL Overall ADL's : Needs assistance/impaired;At baseline Eating/Feeding: Modified independent;Sitting Eating/Feeding Details (indicate cue type and reason): Increased time due to RUE pain and being RT hand dominant. Grooming: Wash/dry hands;Sitting;Set up   Upper Body Bathing: Supervision/ safety;Set up;Sitting   Lower Body Bathing: Minimal assistance;Sitting/lateral leans   Upper Body Dressing : Min guard;Set up;Sitting   Lower Body Dressing: Minimal assistance;Sitting/lateral leans;Set up     Toilet Transfer Details (indicate cue type and reason): Pt pivoted to recliner. Please see Mobility section. Toileting- Clothing Manipulation and  Hygiene: Min guard;Sitting/lateral lean;Sit to/from stand       Functional mobility during ADLs: Min guard;Minimal assistance;Rolling walker;Cueing for safety       Vision  Baseline Vision/History: Wears glasses Wears Glasses: At all times Vision Assessment?: No apparent visual deficits Additional Comments: Able to read wall clock accurately without bifocals.     Perception     Praxis      Pertinent Vitals/Pain Pain Assessment: Faces Faces Pain Scale: Hurts little more Pain Location: RT elbow/UE.  Pt does not recall banging or falling on RUE. Pt reports pain is new in past couple of days. Pain Descriptors / Indicators: Aching Pain Intervention(s): Limited activity within patient's tolerance;Monitored during session;Repositioned;Heat applied (Elevated on pillows with K-pad in place.)     Hand Dominance Right   Extremity/Trunk Assessment Upper Extremity Assessment Upper Extremity Assessment: RUE deficits/detail;LUE deficits/detail RUE Deficits / Details: Shoulder AROM to ~105. Decreased IR and ER. [place]low-->Wrist WFL, but with facial grimace. MMT NT due to pain except grip: 3+/5 RUE Sensation: WNL RUE Coordination: WNL LUE Deficits / Details: AROM: WFL.  MMT: grossly 3+/5 LUE Coordination: WNL   Lower Extremity Assessment Lower Extremity Assessment: Defer to PT evaluation   Cervical / Trunk Assessment Cervical / Trunk Assessment: Normal   Communication Communication Communication: HOH;No difficulties (Wearing hearing aids)   Cognition Arousal/Alertness: Awake/alert Behavior During Therapy: WFL for tasks assessed/performed Overall Cognitive Status: Within Functional Limits for tasks assessed                                 General Comments: Oriented to person, place, years and situation. Stated month as "march" initially but able to correct with cue of Valentine's day.   General Comments       Exercises     Shoulder Instructions      Home Living Family/patient expects to be discharged to:: Assisted living                             Home Equipment: Walker - 4 wheels;Shower seat;Grab bars - tub/shower;Walker - 2  wheels;Hospital bed          Prior Functioning/Environment Level of Independence: Needs assistance  Gait / Transfers Assistance Needed: uses rollator for ambulation.  Had fall and SHD in July 2021. Pt denies any subsequent falls. ADL's / Homemaking Assistance Needed: Pt reports staff assits with LE dressing as needed. Pt reports that she bathes and toilets herself and manages her own medication. Uses transportation offered through [facility] and is active with their in-house PT and OT.            OT Problem List: Pain;Decreased range of motion;Decreased strength;Decreased activity tolerance;Impaired balance (sitting and/or standing);Impaired UE functional use      OT Treatment/Interventions: Self-care/ADL training;Therapeutic exercise;Therapeutic activities;Energy conservation;Patient/family education;DME and/or AE instruction;Balance training    OT Goals(Current goals can be found in the care plan section) Acute Rehab OT Goals Patient Stated Goal: To go home today. OT Goal Formulation: With patient Time For Goal Achievement: 03/27/20 Potential to Achieve Goals: Good ADL Goals Pt Will Perform Grooming: standing;with modified independence Pt Will Perform Lower Body Bathing: with supervision;sitting/lateral leans;sit to/from stand Pt Will Perform Lower Body Dressing: sit to/from stand;sitting/lateral leans;with set-up;with min guard assist Pt Will Transfer to Toilet: with modified independence;ambulating;grab bars Pt Will Perform Toileting - Clothing Manipulation and hygiene: with modified independence;sitting/lateral leans;sit to/from stand  OT Frequency: Min 2X/week   Barriers to D/C:    None known       Co-evaluation              AM-PAC OT "6 Clicks" Daily Activity     Outcome Measure Help from another person eating meals?: None Help from another person taking care of personal grooming?: A Little Help from another person toileting, which includes using toliet,  bedpan, or urinal?: A Little Help from another person bathing (including washing, rinsing, drying)?: A Little Help from another person to put on and taking off regular upper body clothing?: A Little Help from another person to put on and taking off regular lower body clothing?: A Little 6 Click Score: 19   End of Session Equipment Utilized During Treatment: Gait belt;Rolling walker  Activity Tolerance: Patient tolerated treatment well Patient left: in chair;with call bell/phone within reach;with chair alarm set  OT Visit Diagnosis: Unsteadiness on feet (R26.81);Pain;History of falling (Z91.81) Pain - Right/Left: Right Pain - part of body: Shoulder;Arm                Time: 4643-1427 OT Time Calculation (min): 32 min Charges:  OT General Charges $OT Visit: 1 Visit OT Evaluation $OT Eval Low Complexity: 1 Low OT Treatments $Self Care/Home Management : 8-22 mins  Anderson Malta, OT Acute Rehab Services Office: [phone] 03/13/2020  Julien Girt 03/13/2020, 1:11 PM

## 2020-03-13 NOTE — Progress Notes (Signed)
[name] 11:48 AM  Subjective: Patient seen and examined and hospital computer chart reviewed and case discussed with our PA and my partner Dr. Paulita Fujita and she is not aware of any further bleeding and she only has some very minimal periumbilical discomfort and she is tolerating her diet and has no new complaints  Objective: Vital signs stable afebrile no acute distress abdomen is soft essentially nontender no guarding or rebound labs stable BUN normal hemoglobin 12 CT reviewed  Assessment: Presumed hemorrhoidal versus diverticular bleeding currently stable  Plan: Hopefully she will be able to go home soon and we are happy to see back in the office if needed for follow-up and please call if any further question or problem with this hospital stay  [facility] E  office [phone] After 5PM or if no answer call [phone]

## 2020-03-14 DIAGNOSIS — K625 Hemorrhage of anus and rectum: Secondary | ICD-10-CM | POA: Diagnosis not present

## 2020-03-14 LAB — BASIC METABOLIC PANEL
Anion gap: 10 (ref 5–15)
BUN: 12 mg/dL (ref 8–23)
CO2: 23 mmol/L (ref 22–32)
Calcium: 8.8 mg/dL — ABNORMAL LOW (ref 8.9–10.3)
Chloride: 107 mmol/L (ref 98–111)
Creatinine, Ser: 0.94 mg/dL (ref 0.44–1.00)
GFR, Estimated: 54 mL/min — ABNORMAL LOW (ref >60)
Glucose, Bld: 91 mg/dL (ref 70–99)
Potassium: 3.8 mmol/L (ref 3.5–5.1)
Sodium: 140 mmol/L (ref 135–145)

## 2020-03-14 LAB — CBC
HCT: 35.9 % — ABNORMAL LOW (ref 36.0–46.0)
Hemoglobin: 11.2 g/dL — ABNORMAL LOW (ref 12.0–15.0)
MCH: 30.7 pg (ref 26.0–34.0)
MCHC: 31.2 g/dL (ref 30.0–36.0)
MCV: 98.4 fL (ref 80.0–100.0)
Platelets: 160 10*3/uL (ref 150–400)
RBC: 3.65 MIL/uL — ABNORMAL LOW (ref 3.87–5.11)
RDW: 13.5 % (ref 11.5–15.5)
WBC: 6.4 10*3/uL (ref 4.0–10.5)
nRBC: 0 % (ref 0.0–0.2)

## 2020-03-14 NOTE — TOC Transition Note (Signed)
Transition of Care [facility]) - CM/SW Discharge Note   Patient Details  Name: [name] MRN: [id number] Date of Birth: 1916-12-02  Transition of Care [facility] CM/SW Contact:  Lynnell Catalan, RN Phone Number: 03/14/2020, 2:10 PM   Clinical Narrative:    Home health PT/OT orders faxed to [facility] for pt to continue to receive PT/OT at her apartment at [facility].      Barriers to Discharge: Continued Medical Work up   Patient Goals and CMS Choice Patient states their goals for this hospitalization and ongoing recovery are:: go home      Discharge Plan and Services In-house Referral: Clinical Social Work   Post Acute Care Choice: Resumption of Svcs/PTA Provider             Readmission Risk Interventions Readmission Risk Prevention Plan 03/13/2020  Medication Screening Complete  Transportation Screening Complete  Some recent data might be hidden

## 2020-03-14 NOTE — Progress Notes (Addendum)
PROGRESS NOTE    [name]  OMV:[id number] DOB: 1916/02/01 DOA: 03/10/2020 PCP: Josetta Huddle, MD   Brief Narrative:  This is 87 years old female with PMH significant for history of GI bleed, subdural hemorrhage, PVC, hypertension and CKD stage IIIa who presents from her facility with complaints of rectal bleeding.  Patient reports noticing gushing of blood out of her rectum 5 days back but decided to monitor it at home.  She also reports having some dizziness and lightheadedness,  some dyspnea with exertion.  She remains hemodynamically stable.  GI was consulted,  recommended this seems a diverticular bleed.  RBC scan is negative for active bleeding. GI signed off.  Assessment & Plan:   Principal Problem:   Rectal bleeding Active Problems:   CKD (chronic kidney disease) stage 3, GFR 30-59 ml/min (HCC)   HTN (hypertension)   Hematochezia   Acute rectal bleed : Hemoglobin stable 11.6 >> 12.0 > 11.2   Monitor H&H. GI consulted,  Patient was seen by Dr. Paulita Fujita. Patient has history of rectal bleed in the past,  found to be due to internal hemorrhoids on Flex-sig and required banding by general surgery on last admission. Kept NPO initially.  Placed on continuous IV fluids. GI recommended.  Hb remains stable, do not suspect upper GI bleed. Advanced to soft diet, continue hemorrhoidal suppositories. Out of bed to the chair, ambulate as tolerated. GI signed off.  Outpatient GI follow-up. Hb remains stable but patient continues to have blood in the stools.   Hypertension Hold antihypertensives.  Blood pressure is on soft side. Will resume tomorrow once blood pressure improves.  CKD stage IIIa Creatinine stable.  Avoid nephrotoxic agent   DVT prophylaxis: SCDs Code Status: DNR Family Communication: spoke with niece at bed side. Disposition Plan:  Status is: Inpatient  Remains inpatient appropriate because:Inpatient level of care appropriate due to severity of  illness   Dispo:  Patient From: Home  Planned Disposition:  SNF  Expected discharge date: 03/15/2020  Medically stable for discharge: No     Consultants:   Gastroenterology  Procedures:  None Antimicrobials:  Anti-infectives (From admission, onward)   None     Subjective: Patient was seen and examined at bedside.  overnight events noted.  She reports feeling weak,still reports to have rectal bleeding .She reports abdominal pain has resolved.  She has tolerated soft diet. Objective: Vitals:   03/13/20 1419 03/13/20 2033 03/14/20 0517 03/14/20 1502  BP: 116/67 104/62 103/62 123/63  Pulse: 78 75 64 72  Resp:  20 17 15   Temp:  98.3 F (36.8 C) 97.8 F (36.6 C) 97.7 F (36.5 C)  TempSrc:  Oral Oral Oral  SpO2: 100% 96% 97% 99%  Weight:      Height:        Intake/Output Summary (Last 24 hours) at 03/14/2020 1610 Last data filed at 03/14/2020 0900 Gross per 24 hour  Intake 240 ml  Output -  Net 240 ml   Filed Weights   03/11/20 2100  Weight: 58.9 kg    Examination:  General exam: Appears calm and comfortable, not in any dostress  Respiratory system: Clear to auscultation. Respiratory effort normal. Cardiovascular system: S1 & S2 heard, RRR. No JVD, murmurs, rubs, gallops or clicks. No pedal edema. Gastrointestinal system: Abdomen is nondistended, soft and nontender. No organomegaly or masses felt. Normal bowel sounds heard. Central nervous system: Alert and oriented. No focal neurological deficits. Extremities: Symmetric 5 x 5 power. No edema, no cyanosis, no  clubbing. Skin: No rashes, lesions or ulcers Psychiatry: Judgement and insight appear normal. Mood & affect appropriate.     Data Reviewed: I have personally reviewed following labs and imaging studies  CBC: Recent Labs  Lab 03/10/20 2210 03/11/20 0633 03/11/20 1415 03/11/20 2037 03/12/20 0627 03/13/20 0708 03/14/20 0551  WBC 8.9 8.3  --  7.2 6.6 6.5 6.4  NEUTROABS 5.0  --   --   --   --   --    --   HGB 11.4* 11.0* 16.6* 10.9* 11.6* 12.0 11.2*  HCT 35.8* 34.4* 50.1* 34.6* 36.8 38.0 35.9*  MCV 96.2 96.9  --  96.9 96.6 98.2 98.4  PLT 178 156  --  149* 164 165 010   Basic Metabolic Panel: Recent Labs  Lab 03/10/20 2210 03/11/20 0633 03/12/20 0627 03/13/20 0708 03/14/20 0551  NA 140 141 138 139 140  K 3.8 3.5 3.8 3.7 3.8  CL 104 107 105 104 107  CO2 25 24 24 25 23   GLUCOSE 116* 97 85 81 91  BUN 20 18 12 12 12   CREATININE 0.98 0.78 0.86 0.85 0.94  CALCIUM 8.9 8.6* 8.8* 9.0 8.8*  MG  --   --  1.7 1.8  --   PHOS  --   --   --  2.6  --    GFR: Estimated Creatinine Clearance: 28.8 mL/min (by C-G formula based on SCr of 0.94 mg/dL). Liver Function Tests: Recent Labs  Lab 03/10/20 2210  AST 14*  ALT 8  ALKPHOS 67  BILITOT 0.5  PROT 7.0  ALBUMIN 3.3*   No results for input(s): LIPASE, AMYLASE in the last 168 hours. No results for input(s): AMMONIA in the last 168 hours. Coagulation Profile: No results for input(s): INR, PROTIME in the last 168 hours. Cardiac Enzymes: No results for input(s): CKTOTAL, CKMB, CKMBINDEX, TROPONINI in the last 168 hours. BNP (last 3 results) No results for input(s): PROBNP in the last 8760 hours. HbA1C: No results for input(s): HGBA1C in the last 72 hours. CBG: No results for input(s): GLUCAP in the last 168 hours. Lipid Profile: No results for input(s): CHOL, HDL, LDLCALC, TRIG, CHOLHDL, LDLDIRECT in the last 72 hours. Thyroid Function Tests: No results for input(s): TSH, T4TOTAL, FREET4, T3FREE, THYROIDAB in the last 72 hours. Anemia Panel: No results for input(s): VITAMINB12, FOLATE, FERRITIN, TIBC, IRON, RETICCTPCT in the last 72 hours. Sepsis Labs: No results for input(s): PROCALCITON, LATICACIDVEN in the last 168 hours.  Recent Results (from the past 240 hour(s))  Resp Panel by RT-PCR (Flu A&B, Covid) Nasopharyngeal Swab     Status: None   Collection Time: 03/10/20 11:09 PM   Specimen: Nasopharyngeal Swab;  Nasopharyngeal(NP) swabs in vial transport medium  Result Value Ref Range Status   SARS Coronavirus 2 by RT PCR NEGATIVE NEGATIVE Final    Comment: (NOTE) SARS-CoV-2 target nucleic acids are NOT DETECTED.  The SARS-CoV-2 RNA is generally detectable in upper respiratory specimens during the acute phase of infection. The lowest concentration of SARS-CoV-2 viral copies this assay can detect is 138 copies/mL. A negative result does not preclude SARS-Cov-2 infection and should not be used as the sole basis for treatment or other patient management decisions. A negative result may occur with  improper specimen collection/handling, submission of specimen other than nasopharyngeal swab, presence of viral mutation(s) within the areas targeted by this assay, and inadequate number of viral copies(<138 copies/mL). A negative result must be combined with clinical observations, patient history, and epidemiological information. The  expected result is Negative.  Fact Sheet for Patients:  EntrepreneurPulse.com.au  Fact Sheet for Healthcare Providers:  IncredibleEmployment.be  This test is no t yet approved or cleared by the [place] FDA and  has been authorized for detection and/or diagnosis of SARS-CoV-2 by FDA under an Emergency Use Authorization (EUA). This EUA will remain  in effect (meaning this test can be used) for the duration of the COVID-19 declaration under Section 564(b)(1) of the Act, 21 U.S.C.section 360bbb-3(b)(1), unless the authorization is terminated  or revoked sooner.       Influenza A by PCR NEGATIVE NEGATIVE Final   Influenza B by PCR NEGATIVE NEGATIVE Final    Comment: (NOTE) The Xpert Xpress SARS-CoV-2/FLU/RSV plus assay is intended as an aid in the diagnosis of influenza from Nasopharyngeal swab specimens and should not be used as a sole basis for treatment. Nasal washings and aspirates are unacceptable for Xpert Xpress  SARS-CoV-2/FLU/RSV testing.  Fact Sheet for Patients: EntrepreneurPulse.com.au  Fact Sheet for Healthcare Providers: IncredibleEmployment.be  This test is not yet approved or cleared by the [place] FDA and has been authorized for detection and/or diagnosis of SARS-CoV-2 by FDA under an Emergency Use Authorization (EUA). This EUA will remain in effect (meaning this test can be used) for the duration of the COVID-19 declaration under Section 564(b)(1) of the Act, 21 U.S.C. section 360bbb-3(b)(1), unless the authorization is terminated or revoked.  Performed at [facility], [place] [address], [place], [place] [address]     Radiology Studies: No results found.  Scheduled Meds: . hydrocortisone  25 mg Rectal BID  . pantoprazole  40 mg Oral Q M,W,F  . PARoxetine  10 mg Oral Daily  . potassium chloride SA  20 mEq Oral Daily   Continuous Infusions:   LOS: 3 days    Time spent: 25 mins    Shawna Clamp, MD [facility]   If 7PM-7AM, please contact night-coverage

## 2020-03-14 NOTE — Care Management Important Message (Signed)
Important Message  Patient Details IM Letter placed in Patient's room. Name: [name] MRN: [id number] Date of Birth: 04-Sep-1916   Medicare Important Message Given:  Yes     Kerin Salen 03/14/2020, 2:00 PM

## 2020-03-15 DIAGNOSIS — K625 Hemorrhage of anus and rectum: Secondary | ICD-10-CM | POA: Diagnosis not present

## 2020-03-15 LAB — CBC
HCT: 35.7 % — ABNORMAL LOW (ref 36.0–46.0)
Hemoglobin: 11 g/dL — ABNORMAL LOW (ref 12.0–15.0)
MCH: 30.4 pg (ref 26.0–34.0)
MCHC: 30.8 g/dL (ref 30.0–36.0)
MCV: 98.6 fL (ref 80.0–100.0)
Platelets: 153 10*3/uL (ref 150–400)
RBC: 3.62 MIL/uL — ABNORMAL LOW (ref 3.87–5.11)
RDW: 13.5 % (ref 11.5–15.5)
WBC: 5.5 10*3/uL (ref 4.0–10.5)
nRBC: 0 % (ref 0.0–0.2)

## 2020-03-15 LAB — BASIC METABOLIC PANEL
Anion gap: 9 (ref 5–15)
BUN: 13 mg/dL (ref 8–23)
CO2: 24 mmol/L (ref 22–32)
Calcium: 9 mg/dL (ref 8.9–10.3)
Chloride: 109 mmol/L (ref 98–111)
Creatinine, Ser: 0.88 mg/dL (ref 0.44–1.00)
GFR, Estimated: 58 mL/min — ABNORMAL LOW (ref >60)
Glucose, Bld: 93 mg/dL (ref 70–99)
Potassium: 3.8 mmol/L (ref 3.5–5.1)
Sodium: 142 mmol/L (ref 135–145)

## 2020-03-15 MED ORDER — METOPROLOL SUCCINATE ER 25 MG PO TB24
12.5000 mg | ORAL_TABLET | Freq: Every day | ORAL | Status: DC
  Administered 2020-03-15: 12.5 mg via ORAL
  Filled 2020-03-15: qty 1

## 2020-03-15 MED ORDER — METOPROLOL SUCCINATE ER 25 MG PO TB24: 12.5000 mg | ORAL_TABLET | Freq: Every day | ORAL | 0 refills | Status: DC

## 2020-03-15 MED ORDER — ISOSORBIDE MONONITRATE ER 30 MG PO TB24: 15.0000 mg | ORAL_TABLET | Freq: Every day | ORAL | 0 refills | Status: DC

## 2020-03-15 MED ORDER — ISOSORBIDE MONONITRATE ER 30 MG PO TB24
15.0000 mg | ORAL_TABLET | Freq: Every day | ORAL | Status: DC
  Administered 2020-03-15: 15 mg via ORAL
  Filled 2020-03-15: qty 1

## 2020-03-15 MED ORDER — AMLODIPINE BESYLATE 5 MG PO TABS: 2.5000 mg | ORAL_TABLET | Freq: Every day | ORAL | Status: DC

## 2020-03-15 NOTE — Progress Notes (Signed)
Occupational Therapy Treatment Patient Details Name: [name] MRN: [id number] DOB: May 15, 1916 Today's Date: 03/15/2020    History of present illness 87 y.o. female with history h/o CAD, CHF, CKD stage III, diverticulosis, hypertension/hyperlipidemia, pulmonary HTN, vertigo and depression. Pt admitted for rectal bleed.   OT comments  Patient pleasant and agreeable to OT despite not feeling well "I have a headache" which RN provided meds during session. Pt overall min A with RW for OOB activity due to mild instability, note mild posterior lean in standing at toilet during peri care/clothing management. Pt with limited standing tolerance asking to perform g/h tasks seated vs standing at sink. Pt very hopeful she will get to go home today, continue with POC.    Follow Up Recommendations  Home health OT;Other (comment) (resume in house OT)    Equipment Recommendations  None recommended by OT       Precautions / Restrictions Precautions Precautions: Fall Precaution Comments: hears very well with hearing aids in Restrictions Weight Bearing Restrictions: No       Mobility Bed Mobility Overal bed mobility: Modified Independent                Transfers Overall transfer level: Needs assistance Equipment used: Rolling walker (2 wheeled) Transfers: Sit to/from Stand Sit to Stand: Min assist         General transfer comment: please see toilet transfer in ADL section    Balance Overall balance assessment: Needs assistance;History of Falls Sitting-balance support: Feet supported Sitting balance-Leahy Scale: Good     Standing balance support: Single extremity supported Standing balance-Leahy Scale: Poor Standing balance comment: unilateral UE on walker while performing peri care                           ADL either performed or assessed with clinical judgement   ADL Overall ADL's : Needs assistance/impaired;At baseline Eating/Feeding: Set  up;Sitting Eating/Feeding Details (indicate cue type and reason): to open containers Grooming: Wash/dry face;Wash/dry hands;Set up;Sitting;Oral care Grooming Details (indicate cue type and reason): pt with limited standing tolerance, requesting to complete in sitting                 Toilet Transfer: Minimal assistance;Ambulation;Regular Glass blower/designer Details (indicate cue type and reason): pt demonstrates adequate safety reaching for toilet with sitting. min A for safety as patient is mildly unsteady with mild posterior sway in standing. Toileting- Clothing Manipulation and Hygiene: Minimal assistance;Sit to/from stand Toileting - Clothing Manipulation Details (indicate cue type and reason): min A for standing balance, able to manage clothing and peri care after voiding     Functional mobility during ADLs: Minimal assistance;Rolling walker General ADL Comments: patient very pleasant and cooperative, agreeable to OT despite not feeling well "I have headache" limited standing activity tolerance completing most ADLs in sitting.               Cognition Arousal/Alertness: Awake/alert Behavior During Therapy: WFL for tasks assessed/performed Overall Cognitive Status: Within Functional Limits for tasks assessed                                                     Pertinent Vitals/ Pain       Pain Assessment: Faces Faces Pain Scale: Hurts little more Pain Location: head ache Pain Descriptors /  Indicators: Headache Pain Intervention(s): RN gave pain meds during session         Frequency  Min 2X/week        Progress Toward Goals  OT Goals(current goals can now be found in the care plan section)  Progress towards OT goals: Progressing toward goals  Acute Rehab OT Goals Patient Stated Goal: To go home today. OT Goal Formulation: With patient Time For Goal Achievement: 03/27/20 Potential to Achieve Goals: Good ADL Goals Pt Will Perform  Grooming: standing;with modified independence Pt Will Perform Lower Body Bathing: with supervision;sitting/lateral leans;sit to/from stand Pt Will Perform Lower Body Dressing: sit to/from stand;sitting/lateral leans;with set-up;with min guard assist Pt Will Transfer to Toilet: with modified independence;ambulating;grab bars Pt Will Perform Toileting - Clothing Manipulation and hygiene: with modified independence;sitting/lateral leans;sit to/from stand  Plan Discharge plan remains appropriate       AM-PAC OT "6 Clicks" Daily Activity     Outcome Measure   Help from another person eating meals?: A Little Help from another person taking care of personal grooming?: A Little Help from another person toileting, which includes using toliet, bedpan, or urinal?: A Little Help from another person bathing (including washing, rinsing, drying)?: A Little Help from another person to put on and taking off regular upper body clothing?: A Little Help from another person to put on and taking off regular lower body clothing?: A Little 6 Click Score: 18    End of Session Equipment Utilized During Treatment: Rolling walker  OT Visit Diagnosis: Unsteadiness on feet (R26.81);Pain;History of falling (Z91.81) Pain - part of body:  (headache)   Activity Tolerance Patient tolerated treatment well   Patient Left in chair;with call bell/phone within reach   Nurse Communication Mobility status        Time: 4709-6283 OT Time Calculation (min): 23 min  Charges: OT General Charges $OT Visit: 1 Visit OT Treatments $Self Care/Home Management : 23-37 mins  Delbert Phenix OT OT pager: [phone]   Rosemary Holms 03/15/2020, 10:26 AM

## 2020-03-15 NOTE — Discharge Instructions (Signed)

## 2020-03-15 NOTE — Discharge Summary (Signed)
Discharge Summary  [name] VFI:[id number] DOB: 1916-05-25  PCP: Josetta Huddle, MD  Admit date: 03/10/2020 Discharge date: 03/15/2020  Time spent: 35 minutes  Recommendations for Outpatient Follow-up:  1. Follow-up with your PCP in 1 to 2 weeks. 2. Take your medications as prescribed. 3. Continue PT OT with assistance and fall precautions.  Discharge Diagnoses:  Active Hospital Problems   Diagnosis Date Noted  . Rectal bleeding 03/11/2020  . Hematochezia 03/11/2020  . HTN (hypertension) 09/12/2014  . CKD (chronic kidney disease) stage 3, GFR 30-59 ml/min (HCC) 09/12/2014    Resolved Hospital Problems  No resolved problems to display.    Discharge Condition: Stable  Diet recommendation: Resume previous diet.  Vitals:   03/14/20 2115 03/15/20 0627  BP: 122/68 (!) 148/68  Pulse: 71 62  Resp: 14 14  Temp: 97.7 F (36.5 C) (!) 97.3 F (36.3 C)  SpO2: 99% 98%    History of present illness:  This is 87 years old female with PMH significant for history of GI bleed, subdural hemorrhage, PVC, hypertension and CKD stage IIIa who presents from her facility with complaints of rectal bleeding.  Patient reports noticing gushing of blood out of her rectum 5 days prior to admission but decided to monitor it at home.  She remains hemodynamically stable.  Seen by GI, suspected diverticular bleed versus presumed hemorrhoidal bleed.  Last bowel movement on 03/14/2020 with no reported bright red blood per rectum.  Hemoglobin stable at 11.0 from 11.2.  Vital signs are stable.  She denies any abdominal pain.  Tolerating her diet well.  She has no new complaints.  Will follow-up outpatient with GI.  Hospital Course:  Principal Problem:   Rectal bleeding Active Problems:   CKD (chronic kidney disease) stage 3, GFR 30-59 ml/min (HCC)   HTN (hypertension)   Hematochezia  Acute rectal bleed : Hemoglobin stable 11.6 >> 12.0 > 11.2  Monitor H&H. GI consulted,  Patient was seen by  Dr. Paulita Fujita. Patient has history of rectal bleed in the past,  found to be due to internal hemorrhoids on Flex-sig and required banding by general surgery on last admission. Kept NPO initially. Placed on continuous IV fluids. GI recommended.  Hb remains stable, do not suspect upper GI bleed. Advanced to soft diet, continue hemorrhoidal suppositories. Out of bed to the chair, ambulate as tolerated. GI signed off.  Outpatient GI follow-up. Hb remains stable but patient continues to have blood in the stools.  Hypertension Reduced dose of Imdur to 15 mg daily, Toprol-XL 12.5 mg daily Holding off Norvasc due to soft blood pressures. Follow-up with your PCP in 1 to 2 weeks.  CKD stage IIIa Creatinine stable.  Continue to avoid nephrotoxic agent  Ambulatory dysfunction PT OT recommending SNF Patient receiving PT OT at home facility, continue Continue fall precautions.   Code Status: DNR    Consultants:   Gastroenterology   Procedures:  None.  Discharge Exam: BP (!) 148/68 (BP Location: Right Arm)   Pulse 62   Temp (!) 97.3 F (36.3 C) (Oral)   Resp 14   Ht 5\' 6"  (1.676 m)   Wt 58.9 kg   SpO2 98%   BMI 20.96 kg/m  . General: 87 y.o. year-old female well developed well nourished in no acute distress.  Alert and pleasant. . Cardiovascular: Regular rate and rhythm with no rubs or gallops.  No thyromegaly or JVD noted.   Marland Kitchen Respiratory: Clear to auscultation with no wheezes or rales. Good inspiratory effort. Marland Kitchen  Abdomen: Soft nontender nondistended with normal bowel sounds x4 quadrants. . Musculoskeletal: No lower extremity edema bilaterally. Marland Kitchen Psychiatry: Mood is appropriate for condition and setting  Discharge Instructions You were cared for by a hospitalist during your hospital stay. If you have any questions about your discharge medications or the care you received while you were in the hospital after you are discharged, you can call the unit and asked to speak with  the hospitalist on call if the hospitalist that took care of you is not available. Once you are discharged, your primary care physician will handle any further medical issues. Please note that NO REFILLS for any discharge medications will be authorized once you are discharged, as it is imperative that you return to your primary care physician (or establish a relationship with a primary care physician if you do not have one) for your aftercare needs so that they can reassess your need for medications and monitor your lab values.   Allergies as of 03/15/2020      Reactions   Ace Inhibitors Hives, Itching   Streptomycin Other (See Comments), Itching   Pruritus Pruritus   Tramadol Other (See Comments)   unknown   Vesicare [solifenacin Succinate] Itching   Penicillins Rash   Pruritic rash Has patient had a PCN reaction causing immediate rash, facial/tongue/throat swelling, SOB or lightheadedness with hypotension: unknown Has patient had a PCN reaction causing severe rash involving mucus membranes or skin necrosis: unknown Has patient had a PCN reaction that required hospitalization:unknown Has patient had a PCN reaction occurring within the last 10 years: yes If all of the above answers are "NO", then may proceed with Cephalosporin use.      Medication List    STOP taking these medications   amLODipine 2.5 MG tablet Commonly known as: NORVASC   diclofenac Sodium 1 % Gel Commonly known as: VOLTAREN   hydrOXYzine 10 MG tablet Commonly known as: ATARAX/VISTARIL   loperamide 2 MG capsule Commonly known as: IMODIUM     TAKE these medications   acetaminophen 325 MG tablet Commonly known as: TYLENOL Take 325-650 mg by mouth every 6 (six) hours.   Anucort-HC 25 MG suppository Generic drug: hydrocortisone Place 1 suppository rectally as needed.   clotrimazole 1 % cream Commonly known as: LOTRIMIN Apply 1 application topically 2 (two) times daily as needed.   furosemide 40 MG  tablet Commonly known as: LASIX Take 20 mg by mouth as directed. Take 0.5 tablet by mouth Monday, Wednesday, Friday   isosorbide mononitrate 30 MG 24 hr tablet Commonly known as: IMDUR Take 0.5 tablets (15 mg total) by mouth daily for 14 days. What changed: how much to take   meclizine 25 MG tablet Commonly known as: ANTIVERT Take 25 mg by mouth 2 (two) times daily as needed for dizziness or nausea.   metoprolol succinate 25 MG 24 hr tablet Commonly known as: TOPROL-XL Take 0.5 tablets (12.5 mg total) by mouth daily for 14 days. What changed: how much to take   pantoprazole 40 MG tablet Commonly known as: PROTONIX Take 40 mg by mouth as directed. Take on MWF   PARoxetine 10 MG tablet Commonly known as: PAXIL Take 10 mg by mouth daily.   polyethylene glycol 17 g packet Commonly known as: MiraLax Take 17 g by mouth daily as needed for mild constipation.   potassium chloride SA 20 MEQ tablet Commonly known as: KLOR-CON Take 20 mEq by mouth daily.   Premarin 0.3 MG tablet Generic drug: estrogens (  conjugated) Take 1-2 tablets by mouth daily.   PRENATAL PO Take 0.5 tablets by mouth daily.   REFRESH OP Apply 1 drop to eye 2 (two) times daily.      Allergies  Allergen Reactions  . Ace Inhibitors Hives and Itching  . Streptomycin Other (See Comments) and Itching    Pruritus Pruritus   . Tramadol Other (See Comments)    unknown  . Vesicare [Solifenacin Succinate] Itching  . Penicillins Rash    Pruritic rash Has patient had a PCN reaction causing immediate rash, facial/tongue/throat swelling, SOB or lightheadedness with hypotension: unknown Has patient had a PCN reaction causing severe rash involving mucus membranes or skin necrosis: unknown Has patient had a PCN reaction that required hospitalization:unknown Has patient had a PCN reaction occurring within the last 10 years: yes If all of the above answers are "NO", then may proceed with Cephalosporin use.      Follow-up Information    Josetta Huddle, MD. Call in 1 day(s).   Specialty: Internal Medicine Why: Please call for a post hospital follow-up appointment. Contact information: [address] [place] [address] [phone]        Jettie Booze, MD .   Specialties: Cardiology, Radiology, Interventional Cardiology Contact information: [address] [place] [address] [phone]        Clarene Essex, MD. Call in 1 day(s).   Specialty: Gastroenterology Why: Please call for a post hospital follow-up appointment. Contact information: [address] [place] [address] [phone]                The results of significant diagnostics from this hospitalization (including imaging, microbiology, ancillary and laboratory) are listed below for reference.    Significant Diagnostic Studies: CT Angio Pelvis W and/or Wo Contrast  Result Date: 03/11/2020 CLINICAL DATA:  Gastrointestinal bleeding. EXAM: CT ANGIOGRAPHY ABDOMEN and pelvis TECHNIQUE: Multidetector CT imaging of the abdomen was performed using the standard protocol during bolus administration of intravenous contrast. Multiplanar reconstructed images and MIPs were obtained and reviewed to evaluate the vascular anatomy. CONTRAST:  120mL OMNIPAQUE IOHEXOL 350 MG/ML SOLN COMPARISON:  CT abdomen 08/20/2017 FINDINGS: Mild atelectasis at the lung bases.  Large hiatal hernia is present. No liver parenchymal lesion is seen. No calcified gallstones. No stone in the common duct. Pancreas appears normal. Spleen is normal. Adrenal glands are normal. Kidneys are slightly small but do not show any focal lesion or obstruction. Bladder is normal. Hiatal hernia as noted above. No evidence of bleeding within the stomach. Small bowel appears normal without evidence of obstruction. No identifiable small-bowel bleeding. Colon contains a moderate amount of gas and stool. There is  diverticulosis. No sign of arterial bleeding in the colon. No free fluid or air. Scoliotic curvature of the spine with pronounced chronic degenerative changes. The aorta is markedly tortuous. There is aortic atherosclerotic calcification but no aneurysm. Moderate proximal stenosis affecting the celiac artery. Superior mesenteric artery is widely patent. Inferior mesenteric artery is widely patent. Both renal arteries are widely patent. Common iliac arteries show atherosclerotic calcification but no aneurysm or stenosis. Internal iliac arteries and external iliac arteries are patent. Common femoral arteries are patent. IMPRESSION: 1. No evidence of arterial bleeding in the intestine. 2. Large hiatal hernia. 3. Diverticulosis without evidence of diverticulitis. 4. Atherosclerotic disease of the aorta and its branch vessels. 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 Aortic Atherosclerosis (ICD10-I70.0). Electronically Signed   By: Nelson Chimes  M.D.   On: 03/11/2020 00:22   CT Angio Abdomen W and/or Wo Contrast  Result Date: 03/11/2020 CLINICAL DATA:  Gastrointestinal bleeding. EXAM: CT ANGIOGRAPHY ABDOMEN and pelvis TECHNIQUE: Multidetector CT imaging of the abdomen was performed using the standard protocol during bolus administration of intravenous contrast. Multiplanar reconstructed images and MIPs were obtained and reviewed to evaluate the vascular anatomy. CONTRAST:  190mL OMNIPAQUE IOHEXOL 350 MG/ML SOLN COMPARISON:  CT abdomen 08/20/2017 FINDINGS: Mild atelectasis at the lung bases.  Large hiatal hernia is present. No liver parenchymal lesion is seen. No calcified gallstones. No stone in the common duct. Pancreas appears normal. Spleen is normal. Adrenal glands are normal. Kidneys are slightly small but do not show any focal lesion or obstruction. Bladder is normal. Hiatal hernia as noted above. No evidence of bleeding within the stomach. Small bowel appears normal without evidence of obstruction. No  identifiable small-bowel bleeding. Colon contains a moderate amount of gas and stool. There is diverticulosis. No sign of arterial bleeding in the colon. No free fluid or air. Scoliotic curvature of the spine with pronounced chronic degenerative changes. The aorta is markedly tortuous. There is aortic atherosclerotic calcification but no aneurysm. Moderate proximal stenosis affecting the celiac artery. Superior mesenteric artery is widely patent. Inferior mesenteric artery is widely patent. Both renal arteries are widely patent. Common iliac arteries show atherosclerotic calcification but no aneurysm or stenosis. Internal iliac arteries and external iliac arteries are patent. Common femoral arteries are patent. IMPRESSION: 1. No evidence of arterial bleeding in the intestine. 2. Large hiatal hernia. 3. Diverticulosis without evidence of diverticulitis. 4. Atherosclerotic disease of the aorta and its branch vessels. 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 Aortic Atherosclerosis (ICD10-I70.0). Electronically Signed   By: Nelson Chimes M.D.   On: 03/11/2020 00:22    Microbiology: Recent Results (from the past 240 hour(s))  Resp Panel by RT-PCR (Flu A&B, Covid) Nasopharyngeal Swab     Status: None   Collection Time: 03/10/20 11:09 PM   Specimen: Nasopharyngeal Swab; Nasopharyngeal(NP) swabs in vial transport medium  Result Value Ref Range Status   SARS Coronavirus 2 by RT PCR NEGATIVE NEGATIVE Final    Comment: (NOTE) SARS-CoV-2 target nucleic acids are NOT DETECTED.  The SARS-CoV-2 RNA is generally detectable in upper respiratory specimens during the acute phase of infection. The lowest concentration of SARS-CoV-2 viral copies this assay can detect is 138 copies/mL. A negative result does not preclude SARS-Cov-2 infection and should not be used as the sole basis for treatment or other patient management decisions. A negative result may occur with  improper specimen collection/handling,  submission of specimen other than nasopharyngeal swab, presence of viral mutation(s) within the areas targeted by this assay, and inadequate number of viral copies(<138 copies/mL). A negative result must be combined with clinical observations, patient history, and epidemiological information. The expected result is Negative.  Fact Sheet for Patients:  EntrepreneurPulse.com.au  Fact Sheet for Healthcare Providers:  IncredibleEmployment.be  This test is no t yet approved or cleared by the [place] FDA and  has been authorized for detection and/or diagnosis of SARS-CoV-2 by FDA under an Emergency Use Authorization (EUA). This EUA will remain  in effect (meaning this test can be used) for the duration of the COVID-19 declaration under Section 564(b)(1) of the Act, 21 U.S.C.section 360bbb-3(b)(1), unless the authorization is terminated  or revoked sooner.       Influenza A by PCR NEGATIVE NEGATIVE Final   Influenza B by PCR NEGATIVE NEGATIVE Final  Comment: (NOTE) The Xpert Xpress SARS-CoV-2/FLU/RSV plus assay is intended as an aid in the diagnosis of influenza from Nasopharyngeal swab specimens and should not be used as a sole basis for treatment. Nasal washings and aspirates are unacceptable for Xpert Xpress SARS-CoV-2/FLU/RSV testing.  Fact Sheet for Patients: EntrepreneurPulse.com.au  Fact Sheet for Healthcare Providers: IncredibleEmployment.be  This test is not yet approved or cleared by the [place] FDA and has been authorized for detection and/or diagnosis of SARS-CoV-2 by FDA under an Emergency Use Authorization (EUA). This EUA will remain in effect (meaning this test can be used) for the duration of the COVID-19 declaration under Section 564(b)(1) of the Act, 21 U.S.C. section 360bbb-3(b)(1), unless the authorization is terminated or revoked.  Performed at [facility], [place] [address] [place], [place] [address]      Labs: Basic Metabolic Panel: Recent Labs  Lab 03/11/20 [phone] 03/12/20 0627 03/13/20 0708 03/14/20 0551 03/15/20 0551  NA 141 138 139 140 142  K 3.5 3.8 3.7 3.8 3.8  CL 107 105 104 107 109  CO2 24 24 25 23 24   GLUCOSE 97 85 81 91 93  BUN 18 12 12 12 13   CREATININE 0.78 0.86 0.85 0.94 0.88  CALCIUM 8.6* 8.8* 9.0 8.8* 9.0  MG  --  1.7 1.8  --   --   PHOS  --   --  2.6  --   --    Liver Function Tests: Recent Labs  Lab 03/10/20 2210  AST 14*  ALT 8  ALKPHOS 67  BILITOT 0.5  PROT 7.0  ALBUMIN 3.3*   No results for input(s): LIPASE, AMYLASE in the last 168 hours. No results for input(s): AMMONIA in the last 168 hours. CBC: Recent Labs  Lab 03/10/20 2210 03/11/20 0633 03/11/20 2037 03/12/20 0627 03/13/20 0708 03/14/20 0551 03/15/20 0551  WBC 8.9   < > 7.2 6.6 6.5 6.4 5.5  NEUTROABS 5.0  --   --   --   --   --   --   HGB 11.4*   < > 10.9* 11.6* 12.0 11.2* 11.0*  HCT 35.8*   < > 34.6* 36.8 38.0 35.9* 35.7*  MCV 96.2   < > 96.9 96.6 98.2 98.4 98.6  PLT 178   < > 149* 164 165 160 153   < > = values in this interval not displayed.   Cardiac Enzymes: No results for input(s): CKTOTAL, CKMB, CKMBINDEX, TROPONINI in the last 168 hours. BNP: BNP (last 3 results) No results for input(s): BNP in the last 8760 hours.  ProBNP (last 3 results) No results for input(s): PROBNP in the last 8760 hours.  CBG: No results for input(s): GLUCAP in the last 168 hours.     Signed:  Kayleen Memos, MD [facility] 03/15/2020, 1:18 PM

## 2020-03-15 NOTE — TOC Transition Note (Signed)
Transition of Care [facility]) - CM/SW Discharge Note   Patient Details  Name: [name] MRN: [id number] Date of Birth: Jun 13, 1916  [facility] CM/SW Contact:  Lynnell Catalan, RN Phone Number: 03/15/2020, 2:24 PM   Clinical Narrative:    Pt to dc back to her independent living apartment at [facility] today. HHPT/OT orders already faxed to [facility]. Niece [name] to pick pt up and transport her via private vehicle.     Barriers to Discharge: Continued Medical Work up   Patient Goals and CMS Choice Patient states their goals for this hospitalization and ongoing recovery are:: go home     Discharge Plan and Services In-house Referral: Clinical Social Work   Post Acute Care Choice: Resumption of Svcs/PTA Provider               Readmission Risk Interventions Readmission Risk Prevention Plan 03/13/2020  Medication Screening Complete  Transportation Screening Complete  Some recent data might be hidden

## 2020-03-17 DIAGNOSIS — Z9181 History of falling: Secondary | ICD-10-CM | POA: Diagnosis not present

## 2020-03-17 DIAGNOSIS — M6281 Muscle weakness (generalized): Secondary | ICD-10-CM | POA: Diagnosis not present

## 2020-03-17 DIAGNOSIS — R2681 Unsteadiness on feet: Secondary | ICD-10-CM | POA: Diagnosis not present

## 2020-03-20 DIAGNOSIS — M6281 Muscle weakness (generalized): Secondary | ICD-10-CM | POA: Diagnosis not present

## 2020-03-20 DIAGNOSIS — Z9181 History of falling: Secondary | ICD-10-CM | POA: Diagnosis not present

## 2020-03-20 DIAGNOSIS — R2681 Unsteadiness on feet: Secondary | ICD-10-CM | POA: Diagnosis not present

## 2020-03-23 DIAGNOSIS — R2681 Unsteadiness on feet: Secondary | ICD-10-CM | POA: Diagnosis not present

## 2020-03-23 DIAGNOSIS — Z9181 History of falling: Secondary | ICD-10-CM | POA: Diagnosis not present

## 2020-03-23 DIAGNOSIS — M6281 Muscle weakness (generalized): Secondary | ICD-10-CM | POA: Diagnosis not present

## 2020-03-24 DIAGNOSIS — Z9181 History of falling: Secondary | ICD-10-CM | POA: Diagnosis not present

## 2020-03-24 DIAGNOSIS — M6281 Muscle weakness (generalized): Secondary | ICD-10-CM | POA: Diagnosis not present

## 2020-03-24 DIAGNOSIS — R2681 Unsteadiness on feet: Secondary | ICD-10-CM | POA: Diagnosis not present

## 2020-03-27 DIAGNOSIS — R2681 Unsteadiness on feet: Secondary | ICD-10-CM | POA: Diagnosis not present

## 2020-03-27 DIAGNOSIS — M6281 Muscle weakness (generalized): Secondary | ICD-10-CM | POA: Diagnosis not present

## 2020-03-27 DIAGNOSIS — Z9181 History of falling: Secondary | ICD-10-CM | POA: Diagnosis not present

## 2020-03-28 DIAGNOSIS — K625 Hemorrhage of anus and rectum: Secondary | ICD-10-CM | POA: Diagnosis not present

## 2020-03-28 DIAGNOSIS — K649 Unspecified hemorrhoids: Secondary | ICD-10-CM | POA: Diagnosis not present

## 2020-03-28 DIAGNOSIS — I10 Essential (primary) hypertension: Secondary | ICD-10-CM | POA: Diagnosis not present

## 2020-03-28 DIAGNOSIS — K5900 Constipation, unspecified: Secondary | ICD-10-CM | POA: Diagnosis not present

## 2020-03-29 DIAGNOSIS — Z9181 History of falling: Secondary | ICD-10-CM | POA: Diagnosis not present

## 2020-03-29 DIAGNOSIS — R2681 Unsteadiness on feet: Secondary | ICD-10-CM | POA: Diagnosis not present

## 2020-03-29 DIAGNOSIS — M6281 Muscle weakness (generalized): Secondary | ICD-10-CM | POA: Diagnosis not present

## 2020-03-30 DIAGNOSIS — K921 Melena: Secondary | ICD-10-CM | POA: Diagnosis not present

## 2020-03-30 DIAGNOSIS — K639 Disease of intestine, unspecified: Secondary | ICD-10-CM | POA: Diagnosis not present

## 2020-03-31 DIAGNOSIS — R2681 Unsteadiness on feet: Secondary | ICD-10-CM | POA: Diagnosis not present

## 2020-03-31 DIAGNOSIS — M6281 Muscle weakness (generalized): Secondary | ICD-10-CM | POA: Diagnosis not present

## 2020-03-31 DIAGNOSIS — Z9181 History of falling: Secondary | ICD-10-CM | POA: Diagnosis not present

## 2020-04-03 DIAGNOSIS — Z9181 History of falling: Secondary | ICD-10-CM | POA: Diagnosis not present

## 2020-04-03 DIAGNOSIS — R2681 Unsteadiness on feet: Secondary | ICD-10-CM | POA: Diagnosis not present

## 2020-04-03 DIAGNOSIS — M6281 Muscle weakness (generalized): Secondary | ICD-10-CM | POA: Diagnosis not present

## 2020-04-04 DIAGNOSIS — R2681 Unsteadiness on feet: Secondary | ICD-10-CM | POA: Diagnosis not present

## 2020-04-04 DIAGNOSIS — Z9181 History of falling: Secondary | ICD-10-CM | POA: Diagnosis not present

## 2020-04-04 DIAGNOSIS — M6281 Muscle weakness (generalized): Secondary | ICD-10-CM | POA: Diagnosis not present

## 2020-04-05 DIAGNOSIS — R2681 Unsteadiness on feet: Secondary | ICD-10-CM | POA: Diagnosis not present

## 2020-04-05 DIAGNOSIS — Z9181 History of falling: Secondary | ICD-10-CM | POA: Diagnosis not present

## 2020-04-05 DIAGNOSIS — M6281 Muscle weakness (generalized): Secondary | ICD-10-CM | POA: Diagnosis not present

## 2020-04-06 DIAGNOSIS — Z9181 History of falling: Secondary | ICD-10-CM | POA: Diagnosis not present

## 2020-04-06 DIAGNOSIS — M6281 Muscle weakness (generalized): Secondary | ICD-10-CM | POA: Diagnosis not present

## 2020-04-06 DIAGNOSIS — R2681 Unsteadiness on feet: Secondary | ICD-10-CM | POA: Diagnosis not present

## 2020-04-07 DIAGNOSIS — R2681 Unsteadiness on feet: Secondary | ICD-10-CM | POA: Diagnosis not present

## 2020-04-07 DIAGNOSIS — M6281 Muscle weakness (generalized): Secondary | ICD-10-CM | POA: Diagnosis not present

## 2020-04-07 DIAGNOSIS — Z9181 History of falling: Secondary | ICD-10-CM | POA: Diagnosis not present

## 2020-04-10 DIAGNOSIS — Z9181 History of falling: Secondary | ICD-10-CM | POA: Diagnosis not present

## 2020-04-10 DIAGNOSIS — M6281 Muscle weakness (generalized): Secondary | ICD-10-CM | POA: Diagnosis not present

## 2020-04-10 DIAGNOSIS — R2681 Unsteadiness on feet: Secondary | ICD-10-CM | POA: Diagnosis not present

## 2020-04-11 DIAGNOSIS — Z9181 History of falling: Secondary | ICD-10-CM | POA: Diagnosis not present

## 2020-04-11 DIAGNOSIS — M6281 Muscle weakness (generalized): Secondary | ICD-10-CM | POA: Diagnosis not present

## 2020-04-11 DIAGNOSIS — R2681 Unsteadiness on feet: Secondary | ICD-10-CM | POA: Diagnosis not present

## 2020-04-12 DIAGNOSIS — B351 Tinea unguium: Secondary | ICD-10-CM | POA: Diagnosis not present

## 2020-04-12 DIAGNOSIS — K649 Unspecified hemorrhoids: Secondary | ICD-10-CM | POA: Diagnosis not present

## 2020-04-12 DIAGNOSIS — K5900 Constipation, unspecified: Secondary | ICD-10-CM | POA: Diagnosis not present

## 2020-04-12 DIAGNOSIS — K625 Hemorrhage of anus and rectum: Secondary | ICD-10-CM | POA: Diagnosis not present

## 2020-04-12 DIAGNOSIS — I10 Essential (primary) hypertension: Secondary | ICD-10-CM | POA: Diagnosis not present

## 2020-04-13 DIAGNOSIS — R2681 Unsteadiness on feet: Secondary | ICD-10-CM | POA: Diagnosis not present

## 2020-04-13 DIAGNOSIS — Z9181 History of falling: Secondary | ICD-10-CM | POA: Diagnosis not present

## 2020-04-13 DIAGNOSIS — M6281 Muscle weakness (generalized): Secondary | ICD-10-CM | POA: Diagnosis not present

## 2020-04-14 DIAGNOSIS — Z9181 History of falling: Secondary | ICD-10-CM | POA: Diagnosis not present

## 2020-04-14 DIAGNOSIS — M6281 Muscle weakness (generalized): Secondary | ICD-10-CM | POA: Diagnosis not present

## 2020-04-14 DIAGNOSIS — R2681 Unsteadiness on feet: Secondary | ICD-10-CM | POA: Diagnosis not present

## 2020-04-18 DIAGNOSIS — R2681 Unsteadiness on feet: Secondary | ICD-10-CM | POA: Diagnosis not present

## 2020-04-18 DIAGNOSIS — Z9181 History of falling: Secondary | ICD-10-CM | POA: Diagnosis not present

## 2020-04-18 DIAGNOSIS — M6281 Muscle weakness (generalized): Secondary | ICD-10-CM | POA: Diagnosis not present

## 2020-04-19 DIAGNOSIS — R2681 Unsteadiness on feet: Secondary | ICD-10-CM | POA: Diagnosis not present

## 2020-04-19 DIAGNOSIS — Z9181 History of falling: Secondary | ICD-10-CM | POA: Diagnosis not present

## 2020-04-19 DIAGNOSIS — M6281 Muscle weakness (generalized): Secondary | ICD-10-CM | POA: Diagnosis not present

## 2020-04-20 DIAGNOSIS — Z9181 History of falling: Secondary | ICD-10-CM | POA: Diagnosis not present

## 2020-04-20 DIAGNOSIS — R2681 Unsteadiness on feet: Secondary | ICD-10-CM | POA: Diagnosis not present

## 2020-04-20 DIAGNOSIS — M6281 Muscle weakness (generalized): Secondary | ICD-10-CM | POA: Diagnosis not present

## 2020-04-21 DIAGNOSIS — R2681 Unsteadiness on feet: Secondary | ICD-10-CM | POA: Diagnosis not present

## 2020-04-21 DIAGNOSIS — M6281 Muscle weakness (generalized): Secondary | ICD-10-CM | POA: Diagnosis not present

## 2020-04-21 DIAGNOSIS — Z9181 History of falling: Secondary | ICD-10-CM | POA: Diagnosis not present

## 2020-04-24 DIAGNOSIS — Z9181 History of falling: Secondary | ICD-10-CM | POA: Diagnosis not present

## 2020-04-24 DIAGNOSIS — R2681 Unsteadiness on feet: Secondary | ICD-10-CM | POA: Diagnosis not present

## 2020-04-24 DIAGNOSIS — M6281 Muscle weakness (generalized): Secondary | ICD-10-CM | POA: Diagnosis not present

## 2020-04-25 DIAGNOSIS — R2681 Unsteadiness on feet: Secondary | ICD-10-CM | POA: Diagnosis not present

## 2020-04-25 DIAGNOSIS — Z9181 History of falling: Secondary | ICD-10-CM | POA: Diagnosis not present

## 2020-04-25 DIAGNOSIS — M6281 Muscle weakness (generalized): Secondary | ICD-10-CM | POA: Diagnosis not present

## 2020-04-27 DIAGNOSIS — R2681 Unsteadiness on feet: Secondary | ICD-10-CM | POA: Diagnosis not present

## 2020-04-27 DIAGNOSIS — M6281 Muscle weakness (generalized): Secondary | ICD-10-CM | POA: Diagnosis not present

## 2020-04-27 DIAGNOSIS — Z9181 History of falling: Secondary | ICD-10-CM | POA: Diagnosis not present

## 2020-04-28 DIAGNOSIS — Z9181 History of falling: Secondary | ICD-10-CM | POA: Diagnosis not present

## 2020-04-28 DIAGNOSIS — R2681 Unsteadiness on feet: Secondary | ICD-10-CM | POA: Diagnosis not present

## 2020-04-28 DIAGNOSIS — M6281 Muscle weakness (generalized): Secondary | ICD-10-CM | POA: Diagnosis not present

## 2020-05-01 DIAGNOSIS — R2681 Unsteadiness on feet: Secondary | ICD-10-CM | POA: Diagnosis not present

## 2020-05-01 DIAGNOSIS — M6281 Muscle weakness (generalized): Secondary | ICD-10-CM | POA: Diagnosis not present

## 2020-05-01 DIAGNOSIS — Z9181 History of falling: Secondary | ICD-10-CM | POA: Diagnosis not present

## 2020-05-02 DIAGNOSIS — M6281 Muscle weakness (generalized): Secondary | ICD-10-CM | POA: Diagnosis not present

## 2020-05-02 DIAGNOSIS — Z9181 History of falling: Secondary | ICD-10-CM | POA: Diagnosis not present

## 2020-05-02 DIAGNOSIS — R2681 Unsteadiness on feet: Secondary | ICD-10-CM | POA: Diagnosis not present

## 2020-05-03 DIAGNOSIS — M6281 Muscle weakness (generalized): Secondary | ICD-10-CM | POA: Diagnosis not present

## 2020-05-03 DIAGNOSIS — R2681 Unsteadiness on feet: Secondary | ICD-10-CM | POA: Diagnosis not present

## 2020-05-03 DIAGNOSIS — Z9181 History of falling: Secondary | ICD-10-CM | POA: Diagnosis not present

## 2020-05-04 DIAGNOSIS — R2681 Unsteadiness on feet: Secondary | ICD-10-CM | POA: Diagnosis not present

## 2020-05-04 DIAGNOSIS — M6281 Muscle weakness (generalized): Secondary | ICD-10-CM | POA: Diagnosis not present

## 2020-05-04 DIAGNOSIS — Z9181 History of falling: Secondary | ICD-10-CM | POA: Diagnosis not present

## 2020-05-17 DIAGNOSIS — M6281 Muscle weakness (generalized): Secondary | ICD-10-CM | POA: Diagnosis not present

## 2020-05-17 DIAGNOSIS — Z9181 History of falling: Secondary | ICD-10-CM | POA: Diagnosis not present

## 2020-05-17 DIAGNOSIS — R2681 Unsteadiness on feet: Secondary | ICD-10-CM | POA: Diagnosis not present

## 2020-05-19 DIAGNOSIS — M6281 Muscle weakness (generalized): Secondary | ICD-10-CM | POA: Diagnosis not present

## 2020-05-19 DIAGNOSIS — R2681 Unsteadiness on feet: Secondary | ICD-10-CM | POA: Diagnosis not present

## 2020-05-19 DIAGNOSIS — Z9181 History of falling: Secondary | ICD-10-CM | POA: Diagnosis not present

## 2020-05-22 ENCOUNTER — Emergency Department (HOSPITAL_COMMUNITY): Payer: Medicare Other

## 2020-05-22 ENCOUNTER — Encounter (HOSPITAL_COMMUNITY): Payer: Self-pay

## 2020-05-22 ENCOUNTER — Other Ambulatory Visit: Payer: Self-pay

## 2020-05-22 ENCOUNTER — Inpatient Hospital Stay (HOSPITAL_COMMUNITY)
Admission: EM | Admit: 2020-05-22 | Discharge: 2020-05-29 | DRG: 689 | Disposition: A | Discharge status: Skilled Nursing Facility | Payer: Medicare Other | Source: Skilled Nursing Facility | Attending: Student | Admitting: Student

## 2020-05-22 DIAGNOSIS — R109 Unspecified abdominal pain: Secondary | ICD-10-CM | POA: Diagnosis not present

## 2020-05-22 DIAGNOSIS — Z1611 Resistance to penicillins: Secondary | ICD-10-CM | POA: Diagnosis present

## 2020-05-22 DIAGNOSIS — M6259 Muscle wasting and atrophy, not elsewhere classified, multiple sites: Secondary | ICD-10-CM | POA: Diagnosis not present

## 2020-05-22 DIAGNOSIS — I5042 Chronic combined systolic (congestive) and diastolic (congestive) heart failure: Secondary | ICD-10-CM | POA: Diagnosis present

## 2020-05-22 DIAGNOSIS — M2550 Pain in unspecified joint: Secondary | ICD-10-CM | POA: Diagnosis not present

## 2020-05-22 DIAGNOSIS — N3000 Acute cystitis without hematuria: Secondary | ICD-10-CM

## 2020-05-22 DIAGNOSIS — R059 Cough, unspecified: Secondary | ICD-10-CM | POA: Diagnosis not present

## 2020-05-22 DIAGNOSIS — D61818 Other pancytopenia: Secondary | ICD-10-CM | POA: Diagnosis present

## 2020-05-22 DIAGNOSIS — Z7401 Bed confinement status: Secondary | ICD-10-CM | POA: Diagnosis not present

## 2020-05-22 DIAGNOSIS — E876 Hypokalemia: Secondary | ICD-10-CM | POA: Diagnosis present

## 2020-05-22 DIAGNOSIS — N390 Urinary tract infection, site not specified: Principal | ICD-10-CM | POA: Diagnosis present

## 2020-05-22 DIAGNOSIS — E785 Hyperlipidemia, unspecified: Secondary | ICD-10-CM | POA: Diagnosis present

## 2020-05-22 DIAGNOSIS — D696 Thrombocytopenia, unspecified: Secondary | ICD-10-CM | POA: Diagnosis not present

## 2020-05-22 DIAGNOSIS — I2510 Atherosclerotic heart disease of native coronary artery without angina pectoris: Secondary | ICD-10-CM | POA: Diagnosis present

## 2020-05-22 DIAGNOSIS — N3001 Acute cystitis with hematuria: Secondary | ICD-10-CM

## 2020-05-22 DIAGNOSIS — R410 Disorientation, unspecified: Secondary | ICD-10-CM | POA: Diagnosis not present

## 2020-05-22 DIAGNOSIS — N183 Chronic kidney disease, stage 3 unspecified: Secondary | ICD-10-CM | POA: Diagnosis present

## 2020-05-22 DIAGNOSIS — R531 Weakness: Secondary | ICD-10-CM | POA: Diagnosis not present

## 2020-05-22 DIAGNOSIS — Z79890 Hormone replacement therapy (postmenopausal): Secondary | ICD-10-CM

## 2020-05-22 DIAGNOSIS — R54 Age-related physical debility: Secondary | ICD-10-CM | POA: Diagnosis present

## 2020-05-22 DIAGNOSIS — R0602 Shortness of breath: Secondary | ICD-10-CM | POA: Diagnosis not present

## 2020-05-22 DIAGNOSIS — R2689 Other abnormalities of gait and mobility: Secondary | ICD-10-CM | POA: Diagnosis not present

## 2020-05-22 DIAGNOSIS — M6281 Muscle weakness (generalized): Secondary | ICD-10-CM | POA: Diagnosis not present

## 2020-05-22 DIAGNOSIS — I272 Pulmonary hypertension, unspecified: Secondary | ICD-10-CM | POA: Diagnosis present

## 2020-05-22 DIAGNOSIS — B9620 Unspecified Escherichia coli [E. coli] as the cause of diseases classified elsewhere: Secondary | ICD-10-CM | POA: Diagnosis present

## 2020-05-22 DIAGNOSIS — R1311 Dysphagia, oral phase: Secondary | ICD-10-CM | POA: Diagnosis not present

## 2020-05-22 DIAGNOSIS — E860 Dehydration: Secondary | ICD-10-CM | POA: Diagnosis present

## 2020-05-22 DIAGNOSIS — Z90710 Acquired absence of both cervix and uterus: Secondary | ICD-10-CM

## 2020-05-22 DIAGNOSIS — I10 Essential (primary) hypertension: Secondary | ICD-10-CM | POA: Diagnosis present

## 2020-05-22 DIAGNOSIS — Z66 Do not resuscitate: Secondary | ICD-10-CM | POA: Diagnosis present

## 2020-05-22 DIAGNOSIS — U071 COVID-19: Secondary | ICD-10-CM | POA: Diagnosis present

## 2020-05-22 DIAGNOSIS — E872 Acidosis: Secondary | ICD-10-CM | POA: Diagnosis present

## 2020-05-22 DIAGNOSIS — R41841 Cognitive communication deficit: Secondary | ICD-10-CM | POA: Diagnosis not present

## 2020-05-22 DIAGNOSIS — R638 Other symptoms and signs concerning food and fluid intake: Secondary | ICD-10-CM | POA: Diagnosis not present

## 2020-05-22 DIAGNOSIS — Z79899 Other long term (current) drug therapy: Secondary | ICD-10-CM | POA: Diagnosis not present

## 2020-05-22 DIAGNOSIS — N1831 Chronic kidney disease, stage 3a: Secondary | ICD-10-CM | POA: Diagnosis present

## 2020-05-22 DIAGNOSIS — J029 Acute pharyngitis, unspecified: Secondary | ICD-10-CM | POA: Diagnosis not present

## 2020-05-22 DIAGNOSIS — I130 Hypertensive heart and chronic kidney disease with heart failure and stage 1 through stage 4 chronic kidney disease, or unspecified chronic kidney disease: Secondary | ICD-10-CM | POA: Diagnosis present

## 2020-05-22 DIAGNOSIS — I5043 Acute on chronic combined systolic (congestive) and diastolic (congestive) heart failure: Secondary | ICD-10-CM | POA: Diagnosis not present

## 2020-05-22 DIAGNOSIS — D649 Anemia, unspecified: Secondary | ICD-10-CM | POA: Diagnosis not present

## 2020-05-22 DIAGNOSIS — R1084 Generalized abdominal pain: Secondary | ICD-10-CM | POA: Diagnosis not present

## 2020-05-22 DIAGNOSIS — R0689 Other abnormalities of breathing: Secondary | ICD-10-CM | POA: Diagnosis not present

## 2020-05-22 LAB — CBC WITH DIFFERENTIAL/PLATELET
Abs Immature Granulocytes: 0.04 10*3/uL (ref 0.00–0.07)
Basophils Absolute: 0 10*3/uL (ref 0.0–0.1)
Basophils Relative: 1 %
Eosinophils Absolute: 0 10*3/uL (ref 0.0–0.5)
Eosinophils Relative: 0 %
HCT: 40.4 % (ref 36.0–46.0)
Hemoglobin: 13.1 g/dL (ref 12.0–15.0)
Immature Granulocytes: 1 %
Lymphocytes Relative: 29 %
Lymphs Abs: 1 10*3/uL (ref 0.7–4.0)
MCH: 30.7 pg (ref 26.0–34.0)
MCHC: 32.4 g/dL (ref 30.0–36.0)
MCV: 94.6 fL (ref 80.0–100.0)
Monocytes Absolute: 1 10*3/uL (ref 0.1–1.0)
Monocytes Relative: 30 %
Neutro Abs: 1.3 10*3/uL — ABNORMAL LOW (ref 1.7–7.7)
Neutrophils Relative %: 39 %
Platelets: 125 10*3/uL — ABNORMAL LOW (ref 150–400)
RBC: 4.27 MIL/uL (ref 3.87–5.11)
RDW: 13.9 % (ref 11.5–15.5)
WBC: 3.4 10*3/uL — ABNORMAL LOW (ref 4.0–10.5)
nRBC: 0 % (ref 0.0–0.2)

## 2020-05-22 LAB — URINALYSIS, ROUTINE W REFLEX MICROSCOPIC
Bilirubin Urine: NEGATIVE
Glucose, UA: NEGATIVE mg/dL
Ketones, ur: 20 mg/dL — AB
Leukocytes,Ua: NEGATIVE
Nitrite: POSITIVE — AB
Protein, ur: 100 mg/dL — AB
Specific Gravity, Urine: 1.018 (ref 1.005–1.030)
pH: 5 (ref 5.0–8.0)

## 2020-05-22 LAB — COMPREHENSIVE METABOLIC PANEL
ALT: 13 U/L (ref 0–44)
AST: 34 U/L (ref 15–41)
Albumin: 3.7 g/dL (ref 3.5–5.0)
Alkaline Phosphatase: 75 U/L (ref 38–126)
Anion gap: 14 (ref 5–15)
BUN: 18 mg/dL (ref 8–23)
CO2: 21 mmol/L — ABNORMAL LOW (ref 22–32)
Calcium: 9 mg/dL (ref 8.9–10.3)
Chloride: 104 mmol/L (ref 98–111)
Creatinine, Ser: 0.94 mg/dL (ref 0.44–1.00)
GFR, Estimated: 54 mL/min — ABNORMAL LOW (ref >60)
Glucose, Bld: 81 mg/dL (ref 70–99)
Potassium: 3.9 mmol/L (ref 3.5–5.1)
Sodium: 139 mmol/L (ref 135–145)
Total Bilirubin: 0.7 mg/dL (ref 0.3–1.2)
Total Protein: 8 g/dL (ref 6.5–8.1)

## 2020-05-22 LAB — LACTIC ACID, PLASMA
Lactic Acid, Venous: 2 mmol/L (ref 0.5–1.9)
Lactic Acid, Venous: 2.4 mmol/L (ref 0.5–1.9)

## 2020-05-22 LAB — TROPONIN I (HIGH SENSITIVITY)
Troponin I (High Sensitivity): 22 ng/L — ABNORMAL HIGH (ref <18)
Troponin I (High Sensitivity): 25 ng/L — ABNORMAL HIGH (ref <18)

## 2020-05-22 LAB — BRAIN NATRIURETIC PEPTIDE: B Natriuretic Peptide: 129.8 pg/mL — ABNORMAL HIGH (ref 0.0–100.0)

## 2020-05-22 LAB — LIPASE, BLOOD: Lipase: 48 U/L (ref 11–51)

## 2020-05-22 LAB — POC OCCULT BLOOD, ED: Fecal Occult Bld: NEGATIVE

## 2020-05-22 MED ORDER — SODIUM CHLORIDE 0.45 % IV SOLN: INTRAVENOUS | Status: DC

## 2020-05-22 MED ORDER — IOHEXOL 300 MG/ML  SOLN
75.0000 mL | Freq: Once | INTRAMUSCULAR | Status: AC | PRN
  Administered 2020-05-22: 75 mL via INTRAVENOUS

## 2020-05-22 MED ORDER — SODIUM CHLORIDE 0.9 % IV SOLN
2.0000 g | Freq: Once | INTRAVENOUS | Status: AC
  Administered 2020-05-22: 2 g via INTRAVENOUS
  Filled 2020-05-22: qty 20

## 2020-05-22 MED ORDER — SODIUM CHLORIDE 0.9 % IV SOLN
1.0000 g | INTRAVENOUS | Status: AC
  Administered 2020-05-23 – 2020-05-26 (×4): 1 g via INTRAVENOUS
  Filled 2020-05-22 (×2): qty 10
  Filled 2020-05-22 (×2): qty 1

## 2020-05-22 MED ORDER — ENOXAPARIN SODIUM 40 MG/0.4ML ~~LOC~~ SOLN
40.0000 mg | SUBCUTANEOUS | Status: DC
  Administered 2020-05-22 – 2020-05-28 (×7): 40 mg via SUBCUTANEOUS
  Filled 2020-05-22 (×7): qty 0.4

## 2020-05-22 MED ORDER — SODIUM CHLORIDE 0.9 % IV BOLUS
500.0000 mL | Freq: Once | INTRAVENOUS | Status: AC
  Administered 2020-05-22: 500 mL via INTRAVENOUS

## 2020-05-22 NOTE — ED Notes (Signed)
Patient to CT.

## 2020-05-22 NOTE — ED Provider Notes (Signed)
[facility] DEPT Provider Note   CSN: [id number] Arrival date & time: 05/22/20  1347     History Chief Complaint  Patient presents with  . Abdominal Pain    [name] is a 87 y.o. female presents with generalized abdominal pain that started last night.  Most recent bowel movement yesterday though she does endorse that it was more firm than normal.  She does endorse some bright red blood on the toilet tissue when she wiped last night.  She denies any diarrhea, fevers, chills, denies dysuria or urinary frequency or urgency.  Additionally patient has cough and shortness of breath that started last night.  Denies any fevers or chills, denies nausea or vomiting.  Cough is nonproductive  I have personally reviewed this patient's medical records.  She has history of hypertension GERD degenerative disc disease, orthostatic hypotension and DNR listed in her chart.  Patient was recently admitted for GI bleed which was thought to be either diverticular or  hemorrhoidal in nature.  HPI     Past Medical History:  Diagnosis Date  . Allergic rhinitis   . CAD (coronary artery disease)   . CHF (congestive heart failure) ([place])   . CKD (chronic kidney disease) stage 3, GFR 30-59 ml/min (HCC) 09/12/2014  . Diverticular disease   . Diverticulosis   . DJD (degenerative joint disease) of knee   . DJD (degenerative joint disease) of lumbar spine    scoliosis  . Eczema   . Esophageal erosions    from nsaid   . GI bleed   . Hormone replacement therapy (postmenopausal)   . Hyperglycemia   . Hyperlipidemia   . Hypertension   . Pneumonia    july 2009  . Pulmonary hypertension ([place])    a. 2D echo 03/2008 showed normal biventricular thickness, chamber size and systolic function, EF 32-20%, mild mitral regurgitation, aortic sclerosis, moderate pulmonary HTN, pulmonic regurgitation.  Marland Kitchen PVC's (premature ventricular contractions)   . Reactive depression  (situational)   . Sciatica    right lower extremity  . Shingles    right thoracic 2008  . Vertigo     Patient Active Problem List   Diagnosis Date Noted  . Rectal bleeding 03/11/2020  . Hematochezia 03/11/2020  . Orthostatic hypotension 08/13/2019  . Falls, sequela 08/13/2019  . DNR (do not resuscitate) 08/11/2019  . SDH (subdural hematoma) ([place]) 08/09/2019  . Acute lower UTI 01/02/2018  . Acute cystitis without hematuria   . Acute encephalopathy 01/01/2018  . DJD (degenerative joint disease) 08/09/2016  . GI bleed 08/08/2016  . Abnormal echocardiogram 06/14/2016  . PVC's (premature ventricular contractions)   . Bacteremia 09/18/2014  . Fever   . Febrile illness 09/12/2014  . Diverticulitis 09/12/2014  . CKD (chronic kidney disease) stage 3, GFR 30-59 ml/min (HCC) 09/12/2014  . HTN (hypertension) 09/12/2014  . Thrombocytopenia ([place]) 09/12/2014  . Abnormal stress test 01/13/2014  . Rectal bleed 08/10/2013  . Acute renal failure ([place]) 08/10/2013  . Essential hypertension, benign 01/08/2013  . DOE (dyspnea on exertion) 01/08/2013    Past Surgical History:  Procedure Laterality Date  . APPENDECTOMY    . BACK SURGERY    . BREAST LUMPECTOMY    . FLEXIBLE SIGMOIDOSCOPY Left 08/13/2013   Procedure: FLEXIBLE SIGMOIDOSCOPY;  Surgeon: Arta Silence, MD;  Location: [facility] ENDOSCOPY;  Service: Endoscopy;  Laterality: Left;  . TOTAL ABDOMINAL HYSTERECTOMY       OB History   No obstetric history on file.  Family History  Problem Relation Age of Onset  . Heart disease Father   . Heart attack Father   . Heart disease Mother   . Heart attack Mother   . Heart disease Daughter   . Heart disease Son   . Cancer Brother   . Heart disease Sister   . Heart attack Sister        X2  . Heart attack Brother   . Hypertension Brother   . Stroke Neg Hx     Social History   Tobacco Use  . Smoking status: Never Smoker  . Smokeless tobacco: Never Used  Vaping Use  . Vaping Use:  Never used  Substance Use Topics  . Alcohol use: Yes    Comment: occasional cocktail  . Drug use: No    Home Medications Prior to Admission medications   Medication Sig Start Date End Date Taking? Authorizing Provider  acetaminophen (TYLENOL) 325 MG tablet Take 325-650 mg by mouth every 6 (six) hours.    [provider]  ANUCORT-HC 25 MG suppository Place 1 suppository rectally as needed. 02/07/20   [provider]  clotrimazole (LOTRIMIN) 1 % cream Apply 1 application topically 2 (two) times daily as needed.    [provider]  furosemide (LASIX) 40 MG tablet Take 20 mg by mouth as directed. Take 0.5 tablet by mouth Monday, Wednesday, Friday 09/10/16   [provider]  isosorbide mononitrate (IMDUR) 30 MG 24 hr tablet Take 0.5 tablets (15 mg total) by mouth daily for 14 days. 03/15/20 03/29/20  Kayleen Memos, DO  meclizine (ANTIVERT) 25 MG tablet Take 25 mg by mouth 2 (two) times daily as needed for dizziness or nausea.    [provider]  metoprolol succinate (TOPROL-XL) 25 MG 24 hr tablet Take 0.5 tablets (12.5 mg total) by mouth daily for 14 days. 03/15/20 03/29/20  Kayleen Memos, DO  pantoprazole (PROTONIX) 40 MG tablet Take 40 mg by mouth as directed. Take on MWF 11/24/17   [provider]  PARoxetine (PAXIL) 10 MG tablet Take 10 mg by mouth daily. 03/10/20   [provider]  polyethylene glycol (MIRALAX) packet Take 17 g by mouth daily as needed for mild constipation. 08/21/17   Jani Gravel, MD  Polyvinyl Alcohol-Povidone (REFRESH OP) Apply 1 drop to eye 2 (two) times daily.    [provider]  potassium chloride SA (K-DUR,KLOR-CON) 20 MEQ tablet Take 20 mEq by mouth daily.     [provider]  PREMARIN 0.3 MG tablet Take 1-2 tablets by mouth daily. 03/10/20   [provider]  Prenatal Vit-Fe Fumarate-FA (PRENATAL PO) Take 0.5 tablets by mouth daily.    [provider]    Allergies    Ace  inhibitors, Streptomycin, Tramadol, Vesicare [solifenacin succinate], and Penicillins  Review of Systems   Review of Systems  Constitutional: Positive for appetite change. Negative for activity change, chills, diaphoresis, fatigue and fever.  HENT: Negative.   Eyes: Negative.   Respiratory: Positive for cough and shortness of breath. Negative for chest tightness.   Cardiovascular: Negative.  Negative for chest pain, palpitations and leg swelling.  Gastrointestinal: Positive for abdominal pain and blood in stool. Negative for constipation, diarrhea, nausea and vomiting.  Genitourinary: Positive for decreased urine volume and difficulty urinating. Negative for dysuria, flank pain, hematuria, urgency, vaginal bleeding, vaginal discharge and vaginal pain.  Musculoskeletal: Negative.   Skin: Negative.   Neurological: Negative.     Physical Exam Updated Vital Signs  BP 133/81   Pulse 77   Temp 99.2 F (37.3 C) (Oral)   Resp (!) 29   Ht 5\' 11"  (1.803 m)   Wt 68 kg   SpO2 98%   BMI 20.92 kg/m   Physical Exam Vitals and nursing note reviewed. Exam conducted with a chaperone present.  Constitutional:      Appearance: She is not toxic-appearing.  HENT:     Head: Normocephalic and atraumatic.     Nose: Nose normal.     Mouth/Throat:     Mouth: Mucous membranes are moist.     Pharynx: Oropharynx is clear. Uvula midline. No oropharyngeal exudate, posterior oropharyngeal erythema or uvula swelling.     Tonsils: No tonsillar exudate.  Eyes:     General: Lids are normal. Vision grossly intact.        Right eye: No discharge.        Left eye: No discharge.     Extraocular Movements: Extraocular movements intact.     Conjunctiva/sclera: Conjunctivae normal.     Pupils: Pupils are equal, round, and reactive to light.  Neck:     Trachea: Trachea and phonation normal.  Cardiovascular:     Rate and Rhythm: Normal rate and regular rhythm.     Pulses: Normal pulses.     Heart sounds: Normal  heart sounds. No murmur heard.   Pulmonary:     Effort: Pulmonary effort is normal. No tachypnea, accessory muscle usage or respiratory distress.     Breath sounds: Examination of the right-lower field reveals decreased breath sounds. Examination of the left-lower field reveals decreased breath sounds. Decreased breath sounds present. No wheezing or rales.  Chest:     Chest wall: No mass, lacerations, deformity, swelling, tenderness, crepitus or edema.  Abdominal:     General: Bowel sounds are normal. There is no distension.     Palpations: Abdomen is soft.     Tenderness: There is generalized abdominal tenderness and tenderness in the right lower quadrant, periumbilical area and left lower quadrant. There is left CVA tenderness. There is no right CVA tenderness, guarding or rebound.  Genitourinary:    Rectum: Guaiac result negative. No mass, external hemorrhoid or internal hemorrhoid.     Comments: Large amount of light brown stool, nonmelanotic on the gloved finger after rectal exam.  No palpable internal hemorrhoids.  Fecal occult negative. Large amount of soft stool present in the rectal vault.  Musculoskeletal:        General: No deformity.     Cervical back: Neck supple. No crepitus. No pain with movement, spinous process tenderness or muscular tenderness.     Right lower leg: No edema.     Left lower leg: No edema.  Lymphadenopathy:     Cervical: No cervical adenopathy.  Skin:    General: Skin is warm and dry.  Neurological:     General: No focal deficit present.     Mental Status: She is alert and oriented to person, place, and time. Mental status is at baseline.     Sensory: Sensation is intact.     Motor: Motor function is intact.  Psychiatric:        Mood and Affect: Mood normal.     ED Results / Procedures / Treatments   Labs (all labs ordered are listed, but only abnormal results are displayed) Labs Reviewed  CBC WITH DIFFERENTIAL/PLATELET - Abnormal; Notable for  the following components:      Result Value   WBC 3.4 (*)  Platelets 125 (*)    Neutro Abs 1.3 (*)    All other components within normal limits  URINALYSIS, ROUTINE W REFLEX MICROSCOPIC - Abnormal; Notable for the following components:   APPearance HAZY (*)    Hgb urine dipstick MODERATE (*)    Ketones, ur 20 (*)    Protein, ur 100 (*)    Nitrite POSITIVE (*)    Bacteria, UA MANY (*)    All other components within normal limits  URINE CULTURE  BRAIN NATRIURETIC PEPTIDE  LACTIC ACID, PLASMA  LACTIC ACID, PLASMA  COMPREHENSIVE METABOLIC PANEL  LIPASE, BLOOD  POC OCCULT BLOOD, ED  TROPONIN I (HIGH SENSITIVITY)  TROPONIN I (HIGH SENSITIVITY)    EKG None  Radiology DG Chest Portable 1 View  Result Date: 05/22/2020 CLINICAL DATA:  Shortness of breath EXAM: PORTABLE CHEST 1 VIEW COMPARISON:  08/09/2019 FINDINGS: The heart size and mediastinal contours are within normal limits. Both lungs are clear. No pleural effusion or pneumothorax. The visualized skeletal structures are unremarkable. IMPRESSION: No acute process in the chest. Electronically Signed   By: Macy Mis M.D.   On: 05/22/2020 15:47    Procedures Procedures   Medications Ordered in ED Medications - No data to display  ED Course  I have reviewed the triage vital signs and the nursing notes.  Pertinent labs & imaging results that were available during my care of the patient were reviewed by me and considered in my medical decision making (see chart for details).  Clinical Course as of 05/22/20 1602  Mon May 22, 2020  1546 Nitrite(!): POSITIVE [JS]  1546 Bacteria, UA(!): MANY [JS]  1546 Fecal Occult Blood, POC: NEGATIVE [JS]    Clinical Course User Index [JS] Janeece Fitting, PA-C   MDM Rules/Calculators/A&P                          87 year old female presents with concern for abdominal pain since last night.  History of GI bleed.   Differential diagnosis includes but is limited to acute GI bleed,  gastroenteritis, diverticulitis, cholecystitis, mesenteric ischemia, nephrolithiasis, urinary tract infection, pyelonephritis, bowel obstruction, malignancy  Vital signs are normal on intake.  Cardiopulmonary exam is normal, abdominal exam with generalized tenderness palpation worse in the right lower and left lower quadrants.  There is left-sided CVA tenderness to palpation.  Rectal exam with nonmelanotic stool gloved following exam.  Patient is well-appearing.  Broad work-up ordered.  Care of this patient signed out to oncoming ED provider, J. Soto, PA-C at time of shift change.  All pertinent HPI, physical exam, laboratory findings were discussed with her prior to my departure.  I appreciate her collaboration of care with patient.  Destenie was made aware that her work up would be completed by another provider. She voiced understanding.   This chart was dictated using voice recognition software, Dragon. Despite the best efforts of this provider to proofread and correct errors, errors may still occur which can change documentation meaning.  Final Clinical Impression(s) / ED Diagnoses Final diagnoses:  None    Rx / DC Orders ED Discharge Orders    None       Aura Dials 05/22/20 1602    Blanchie Dessert, MD 05/25/20 2329

## 2020-05-22 NOTE — H&P (Signed)
History and Physical   [name] [id number] DOB: Jul 22, 1916 DOA: 05/22/2020  Referring MD/NP/PA: Dr. Zenia Resides  PCP: Josetta Huddle, MD   Outpatient Specialists: None  Patient coming from: [facility] skilled facility  Chief Complaint: Abdominal pain and weakness  HPI: [name] is a 87 y.o. female with medical history significant of recurrent UTI, diastolic CHF, coronary artery disease, chronic kidney disease stage III, diverticular disease, hyperlipidemia, hypertension and prior GI bleed who was brought in from skilled facility was complaining of weakness abdominal pain.  No fever or chills.  No nausea vomiting or diarrhea.  Patient is not a good historian but records show that she had slight bleeding apparently in her last bowel movement.  Patient evaluated in the ER.  CT showed mainly sliding hiatal hernia and sigmoid diverticulosis but no diverticulitis.  Urinalysis consistent with acute UTI was positive nitrite bacteria.  Her fecal occult blood test is negative here.  Patient is being admitted with another episode of UTI...  ED Course: Temperature is 99.5 blood pressure 160/84, pulse 81 respiratory rate of 29 oxygen sat 94% room air.  Troponin is 25.  Chemistry largely within normal.  Lactic acid 2.4.  COVID-19 screen currently pending.  Chest x-ray showed no acute findings.  Urinalysis consistent with UTI with positive nitrite leukocyte and bacteria.  Patient being admitted with diagnosis of UTI symptomatic.  Review of Systems: As per HPI otherwise 10 point review of systems negative.    Past Medical History:  Diagnosis Date  . Allergic rhinitis   . CAD (coronary artery disease)   . CHF (congestive heart failure) ([place])   . CKD (chronic kidney disease) stage 3, GFR 30-59 ml/min (HCC) 09/12/2014  . Diverticular disease   . Diverticulosis   . DJD (degenerative joint disease) of knee   . DJD (degenerative joint disease) of lumbar spine    scoliosis  . Eczema    . Esophageal erosions    from nsaid   . GI bleed   . Hormone replacement therapy (postmenopausal)   . Hyperglycemia   . Hyperlipidemia   . Hypertension   . Pneumonia    july 2009  . Pulmonary hypertension ([place])    a. 2D echo 03/2008 showed normal biventricular thickness, chamber size and systolic function, EF [id number], mild mitral regurgitation, aortic sclerosis, moderate pulmonary HTN, pulmonic regurgitation.  Marland Kitchen PVC's (premature ventricular contractions)   . Reactive depression (situational)   . Sciatica    right lower extremity  . Shingles    right thoracic 2008  . Vertigo     Past Surgical History:  Procedure Laterality Date  . APPENDECTOMY    . BACK SURGERY    . BREAST LUMPECTOMY    . FLEXIBLE SIGMOIDOSCOPY Left 08/13/2013   Procedure: FLEXIBLE SIGMOIDOSCOPY;  Surgeon: Arta Silence, MD;  Location: [facility];  Service: Endoscopy;  Laterality: Left;  . TOTAL ABDOMINAL HYSTERECTOMY       reports that she has never smoked. She has never used smokeless tobacco. She reports current alcohol use. She reports that she does not use drugs.  Allergies  Allergen Reactions  . Ace Inhibitors Hives and Itching  . Streptomycin Other (See Comments) and Itching    Pruritus Pruritus   . Tramadol Other (See Comments)    unknown  . Vesicare [Solifenacin Succinate] Itching  . Penicillins Rash    Pruritic rash Has patient had a PCN reaction causing immediate rash, facial/tongue/throat swelling, SOB or lightheadedness with hypotension: unknown Has patient had a PCN  reaction causing severe rash involving mucus membranes or skin necrosis: unknown Has patient had a PCN reaction that required hospitalization:unknown Has patient had a PCN reaction occurring within the last 10 years: yes If all of the above answers are "NO", then may proceed with Cephalosporin use.     Family History  Problem Relation Age of Onset  . Heart disease Father   . Heart attack Father   . Heart disease  Mother   . Heart attack Mother   . Heart disease Daughter   . Heart disease Son   . Cancer Brother   . Heart disease Sister   . Heart attack Sister        X2  . Heart attack Brother   . Hypertension Brother   . Stroke Neg Hx      Prior to Admission medications   Medication Sig Start Date End Date Taking? Authorizing Provider  acetaminophen (TYLENOL) 325 MG tablet Take 325-650 mg by mouth every 6 (six) hours.    [provider]  ANUCORT-HC 25 MG suppository Place 1 suppository rectally as needed. 02/07/20   [provider]  clotrimazole (LOTRIMIN) 1 % cream Apply 1 application topically 2 (two) times daily as needed.    [provider]  furosemide (LASIX) 40 MG tablet Take 20 mg by mouth as directed. Take 0.5 tablet by mouth Monday, Wednesday, Friday 09/10/16   [provider]  isosorbide mononitrate (IMDUR) 30 MG 24 hr tablet Take 0.5 tablets (15 mg total) by mouth daily for 14 days. 03/15/20 03/29/20  Kayleen Memos, DO  meclizine (ANTIVERT) 25 MG tablet Take 25 mg by mouth 2 (two) times daily as needed for dizziness or nausea.    [provider]  metoprolol succinate (TOPROL-XL) 25 MG 24 hr tablet Take 0.5 tablets (12.5 mg total) by mouth daily for 14 days. 03/15/20 03/29/20  Kayleen Memos, DO  pantoprazole (PROTONIX) 40 MG tablet Take 40 mg by mouth as directed. Take on MWF 11/24/17   [provider]  PARoxetine (PAXIL) 10 MG tablet Take 10 mg by mouth daily. 03/10/20   [provider]  polyethylene glycol (MIRALAX) packet Take 17 g by mouth daily as needed for mild constipation. 08/21/17   Jani Gravel, MD  Polyvinyl Alcohol-Povidone (REFRESH OP) Apply 1 drop to eye 2 (two) times daily.    [provider]  potassium chloride SA (K-DUR,KLOR-CON) 20 MEQ tablet Take 20 mEq by mouth daily.     [provider]  PREMARIN 0.3 MG tablet Take 1-2 tablets by mouth daily. 03/10/20   [provider]  Prenatal Vit-Fe  Fumarate-FA (PRENATAL PO) Take 0.5 tablets by mouth daily.    [provider]    Physical Exam: Vitals:   05/22/20 1730 05/22/20 1800 05/22/20 1900 05/22/20 2000  BP: (!) 144/98 (!) 139/119 (!) 149/63 (!) 144/73  Pulse: 63 65 64 80  Resp: 17 19 20 20   Temp:    99.5 F (37.5 C)  TempSrc:    Rectal  SpO2: 97% 96% 95% 95%  Weight:      Height:          Constitutional: Frail, confused, no distress Vitals:   05/22/20 1730 05/22/20 1800 05/22/20 1900 05/22/20 2000  BP: (!) 144/98 (!) 139/119 (!) 149/63 (!) 144/73  Pulse: 63 65 64 80  Resp: 17 19 20 20   Temp:    99.5 F (37.5 C)  TempSrc:    Rectal  SpO2: 97% 96% 95% 95%  Weight:      Height:       Eyes: PERRL, lids and conjunctivae normal ENMT: Mucous membranes are dry. Posterior pharynx clear of any exudate or lesions.Normal dentition.  Neck: normal, supple, no masses, no thyromegaly Respiratory: clear to auscultation bilaterally, no wheezing, no crackles. Normal respiratory effort. No accessory muscle use.  Cardiovascular: Regular rate and rhythm, no murmurs / rubs / gallops. No extremity edema. 2+ pedal pulses. No carotid bruits.  Abdomen: Mild suprapubic tenderness, no masses palpated. No hepatosplenomegaly. Bowel sounds positive.  Musculoskeletal: no clubbing / cyanosis. No joint deformity upper and lower extremities. Good ROM, no contractures. Normal muscle tone.  Skin: no rashes, lesions, ulcers. No induration Neurologic: CN 2-12 grossly intact. Sensation intact, DTR normal. Strength 5/5 in all 4.  Psychiatric: Confused, disoriented x3    Labs on Admission: I have personally reviewed following labs and imaging studies  CBC: Recent Labs  Lab 05/22/20 1451  WBC 3.4*  NEUTROABS 1.3*  HGB 13.1  HCT 40.4  MCV 94.6  PLT 0000000*   Basic Metabolic Panel: Recent Labs  Lab 05/22/20 1730  NA 139  K 3.9  CL 104  CO2 21*  GLUCOSE 81  BUN 18  CREATININE 0.94  CALCIUM 9.0   GFR: Estimated Creatinine  Clearance: 33.3 mL/min (by C-G formula based on SCr of 0.94 mg/dL). Liver Function Tests: Recent Labs  Lab 05/22/20 1730  AST 34  ALT 13  ALKPHOS 75  BILITOT 0.7  PROT 8.0  ALBUMIN 3.7   Recent Labs  Lab 05/22/20 1730  LIPASE 48   No results for input(s): AMMONIA in the last 168 hours. Coagulation Profile: No results for input(s): INR, PROTIME in the last 168 hours. Cardiac Enzymes: No results for input(s): CKTOTAL, CKMB, CKMBINDEX, TROPONINI in the last 168 hours. BNP (last 3 results) No results for input(s): PROBNP in the last 8760 hours. HbA1C: No results for input(s): HGBA1C in the last 72 hours. CBG: No results for input(s): GLUCAP in the last 168 hours. Lipid Profile: No results for input(s): CHOL, HDL, LDLCALC, TRIG, CHOLHDL, LDLDIRECT in the last 72 hours. Thyroid Function Tests: No results for input(s): TSH, T4TOTAL, FREET4, T3FREE, THYROIDAB in the last 72 hours. Anemia Panel: No results for input(s): VITAMINB12, FOLATE, FERRITIN, TIBC, IRON, RETICCTPCT in the last 72 hours. Urine analysis:    Component Value Date/Time   COLORURINE YELLOW 05/22/2020 1451   APPEARANCEUR HAZY (A) 05/22/2020 1451   LABSPEC 1.018 05/22/2020 1451   PHURINE 5.0 05/22/2020 1451   GLUCOSEU NEGATIVE 05/22/2020 1451   HGBUR MODERATE (A) 05/22/2020 1451   BILIRUBINUR NEGATIVE 05/22/2020 1451   KETONESUR 20 (A) 05/22/2020 1451   PROTEINUR 100 (A) 05/22/2020 1451   UROBILINOGEN 0.2 09/18/2014 1600   NITRITE POSITIVE (A) 05/22/2020 1451   LEUKOCYTESUR NEGATIVE 05/22/2020 1451   Sepsis Labs: @LABRCNTIP (procalcitonin:4,lacticidven:4) )No results found for this or any previous visit (from the past 240 hour(s)).   Radiological Exams on Admission: CT Abdomen Pelvis W Contrast  Result Date: 05/22/2020 CLINICAL DATA:  Abdominal pain.  No nausea or vomiting. EXAM: CT ABDOMEN AND PELVIS WITH CONTRAST TECHNIQUE: Multidetector CT imaging of the abdomen and pelvis was performed using the  standard protocol following bolus administration of intravenous contrast. CONTRAST:  75 mL Omnipaque COMPARISON:  CT abdomen pelvis 03/11/2020 FINDINGS: Lower chest: Lung bases are clear. Hepatobiliary: No focal hepatic lesion. Gallbladder normal. No intrahepatic duct dilatation. Mild extrahepatic biliary duct dilatation with the common bile duct measuring 9 mm and favored benign  senescent dilatation. Pancreas: Pancreas is normal. No ductal dilatation. No pancreatic inflammation. Spleen: Normal spleen Adrenals/urinary tract: Adrenal glands and kidneys are normal. The ureters and bladder normal. Stomach/Bowel: Large hiatal hernia with approximately half the stomach above the hemidiaphragms. Sliding-type hiatal hernia. Duodenum small-bowel normal. Cecum is normal. Appendix not identified. Scattered diverticula of the descending colon. Multiple diverticula sigmoid colon. No acute inflammation. No bowel obstruction. Vascular/Lymphatic: Abdominal aorta is normal caliber with atherosclerotic calcification. There is no retroperitoneal or periportal lymphadenopathy. No pelvic lymphadenopathy. Reproductive: Post hysterectomy.  Adnexa unremarkable Other: No free fluid. Musculoskeletal: No aggressive osseous lesion. Degenerative osteophytosis of the spine. Chronic compression fracture at L1. IMPRESSION: 1. No acute findings in the abdomen pelvis. 2. Large sliding-type hiatal hernia. 3. Sigmoid diverticulosis without diverticulitis. 4. Extrahepatic biliary dilatation is favored benign senescent duct dilatation. 5. Extensive degenerative changes of the thoracolumbar spine. Electronically Signed   By: Suzy Bouchard M.D.   On: 05/22/2020 19:01   DG Chest Portable 1 View  Result Date: 05/22/2020 CLINICAL DATA:  Shortness of breath EXAM: PORTABLE CHEST 1 VIEW COMPARISON:  08/09/2019 FINDINGS: The heart size and mediastinal contours are within normal limits. Both lungs are clear. No pleural effusion or pneumothorax. The  visualized skeletal structures are unremarkable. IMPRESSION: No acute process in the chest. Electronically Signed   By: Macy Mis M.D.   On: 05/22/2020 15:47      Assessment/Plan Principal Problem:   UTI (urinary tract infection) Active Problems:   Essential hypertension, benign   CKD (chronic kidney disease) stage 3, GFR 30-59 ml/min (HCC)     #1 acute on chronic UTI: Patient is symptomatic.  Due to her age and the fact that she has had chronic UTIs we will admit her to the hospital.  Initiate IV antibiotics and obtain urine cultures.  Transition to oral antibiotics if feasible prior to discharge.  #2 coronary artery disease: Stable.  Continue to monitor  #3 diverticular disease: No bleeding at this point.  Continue to monitor.  #4 chronic diastolic CHF: Appears to be at baseline.  Continue monitoring  #5 essential hypertension: Continue home regimen.  #6 chronic kidney disease stage III: Continue to monitor renal function   DVT prophylaxis: Lovenox Code Status: DNR Family Communication: No family at bedside Disposition Plan: Back to skilled facility Consults called: None Admission status: Inpatient  Severity of Illness: The appropriate patient status for this patient is INPATIENT. Inpatient status is judged to be reasonable and necessary in order to provide the required intensity of service to ensure the patient's safety. The patient's presenting symptoms, physical exam findings, and initial radiographic and laboratory data in the context of their chronic comorbidities is felt to place them at high risk for further clinical deterioration. Furthermore, it is not anticipated that the patient will be medically stable for discharge from the hospital within 2 midnights of admission. The following factors support the patient status of inpatient.   " The patient's presenting symptoms include weakness with abdominal pain. " The worrisome physical exam findings include frail woman  in no acute distress. " The initial radiographic and laboratory data are worrisome because of evidence of UTI. " The chronic co-morbidities include coronary artery disease.   * I certify that at the point of admission it is my clinical judgment that the patient will require inpatient hospital care spanning beyond 2 midnights from the point of admission due to high intensity of service, high risk for further deterioration and high frequency of surveillance required.*  Barbette Merino MD [facility] Pager [phone]  If 7PM-7AM, please contact night-coverage [email or web address] Password [facility]  05/22/2020, 9:23 PM

## 2020-05-22 NOTE — ED Triage Notes (Signed)
Arrives from [place] via EMS- Onset of abd pain last night.  Denies nausea or vomiting.  Patient reports last BM yesterday

## 2020-05-22 NOTE — ED Provider Notes (Signed)
  Physical Exam  BP (!) 149/63   Pulse 64   Temp 99.2 F (37.3 C) (Oral)   Resp 20   Ht 5\' 11"  (1.803 m)   Wt 68 kg   SpO2 95%   BMI 20.92 kg/m   Physical Exam Vitals and nursing note reviewed.  Constitutional:      Appearance: She is well-developed.  HENT:     Head: Normocephalic and atraumatic.  Cardiovascular:     Rate and Rhythm: Normal rate.  Pulmonary:     Effort: Pulmonary effort is normal.  Abdominal:     General: Abdomen is flat.     Palpations: Abdomen is soft.     Tenderness: There is abdominal tenderness in the suprapubic area. There is no left CVA tenderness.  Skin:    General: Skin is warm and dry.  Neurological:     Mental Status: She is alert and oriented to person, place, and time.    ED Course/Procedures   Clinical Course as of 05/22/20 1929  Mon May 22, 2020  1546 Nitrite(!): POSITIVE [JS]  1546 Bacteria, UA(!): MANY [JS]  1546 Fecal Occult Blood, POC: NEGATIVE [JS]    Clinical Course User Index [JS] Janeece Fitting, PA-C    Procedures  MDM  Patient care assumed from [place]. PA at shift change, please see note for a full HPI. Briefly, patient here with generalized abdominal pain that began last night.  Recent bowel movement noted for some blood in his stool with wiping.  Does have a prior history of GI bleed, last admitted 2 months ago for this.  Plan is for following up labs, CT imaging to further evaluate.  Hemoccult was obtained by my colleague, this was taken to the lab for further eval.  Vitals on arrival are within normal limits, she is saturation is 96% on room air, vitals are within her normal limits without any tachycardia.   Xray of her chest showed: No acute process in the chest.  Labs are remarkable for leukopenia, hemoglobin is within normal limits.  Her Hemoccult is negative, I do not suspect GI bleed at this time.  BUN is also within normal limits.  CMP without any electrolyte derangement, creatinine level is within normal  limits.  LFTs are unremarkable.  BNP is slightly elevated.  Lipase level is normal.  Troponin is slightly elevated.  Lactic acid repeat.  CT Abdomen/ pelvis showed: 1. No acute findings in the abdomen pelvis.  2. Large sliding-type hiatal hernia.  3. Sigmoid diverticulosis without diverticulitis.  4. Extrahepatic biliary dilatation is favored benign senescent duct  dilatation.  5. Extensive degenerative changes of the thoracolumbar spine.     7:11 PM Call placed to cousin [name], who is aware of results and has been informed. Placed called to hospitalist for admission, patient stable for further management.   7:29 PM spoke to Dr. Jonelle Sidle, hospitalist service appreciate his assistance.  Patient will be admitted for further management of her urinary tract infection.  She remains hemodynamically stable.   Portions of this note were generated with [employer]. Dictation errors may occur despite best attempts at proofreading.        Janeece Fitting, PA-C 05/22/20 1929    Lacretia Leigh, MD 05/22/20 2022

## 2020-05-23 DIAGNOSIS — N3001 Acute cystitis with hematuria: Secondary | ICD-10-CM | POA: Diagnosis not present

## 2020-05-23 LAB — COMPREHENSIVE METABOLIC PANEL
ALT: 12 U/L (ref 0–44)
AST: 32 U/L (ref 15–41)
Albumin: 3.1 g/dL — ABNORMAL LOW (ref 3.5–5.0)
Alkaline Phosphatase: 60 U/L (ref 38–126)
Anion gap: 11 (ref 5–15)
BUN: 17 mg/dL (ref 8–23)
CO2: 19 mmol/L — ABNORMAL LOW (ref 22–32)
Calcium: 8.4 mg/dL — ABNORMAL LOW (ref 8.9–10.3)
Chloride: 106 mmol/L (ref 98–111)
Creatinine, Ser: 0.84 mg/dL (ref 0.44–1.00)
GFR, Estimated: >60 mL/min (ref >60)
Glucose, Bld: 92 mg/dL (ref 70–99)
Potassium: 3 mmol/L — ABNORMAL LOW (ref 3.5–5.1)
Sodium: 136 mmol/L (ref 135–145)
Total Bilirubin: 0.2 mg/dL — ABNORMAL LOW (ref 0.3–1.2)
Total Protein: 6.8 g/dL (ref 6.5–8.1)

## 2020-05-23 LAB — CBC
HCT: 39.7 % (ref 36.0–46.0)
Hemoglobin: 12 g/dL (ref 12.0–15.0)
MCH: 30.2 pg (ref 26.0–34.0)
MCHC: 30.2 g/dL (ref 30.0–36.0)
MCV: 99.7 fL (ref 80.0–100.0)
Platelets: 120 10*3/uL — ABNORMAL LOW (ref 150–400)
RBC: 3.98 MIL/uL (ref 3.87–5.11)
RDW: 13.9 % (ref 11.5–15.5)
WBC: 3.3 10*3/uL — ABNORMAL LOW (ref 4.0–10.5)
nRBC: 0 % (ref 0.0–0.2)

## 2020-05-23 LAB — LACTIC ACID, PLASMA
Lactic Acid, Venous: 1.5 mmol/L (ref 0.5–1.9)
Lactic Acid, Venous: 2 mmol/L (ref 0.5–1.9)

## 2020-05-23 LAB — SARS CORONAVIRUS 2 (TAT 6-24 HRS): SARS Coronavirus 2: POSITIVE — AB

## 2020-05-23 LAB — MAGNESIUM: Magnesium: 1.7 mg/dL (ref 1.7–2.4)

## 2020-05-23 MED ORDER — POTASSIUM CHLORIDE CRYS ER 20 MEQ PO TBCR
40.0000 meq | EXTENDED_RELEASE_TABLET | ORAL | Status: AC
  Administered 2020-05-23 (×2): 40 meq via ORAL
  Filled 2020-05-23 (×2): qty 2

## 2020-05-23 MED ORDER — POTASSIUM CHLORIDE 10 MEQ/100ML IV SOLN
10.0000 meq | INTRAVENOUS | Status: DC
  Administered 2020-05-23: 10 meq via INTRAVENOUS
  Filled 2020-05-23: qty 100

## 2020-05-23 MED ORDER — ADULT MULTIVITAMIN W/MINERALS CH
1.0000 | ORAL_TABLET | Freq: Every day | ORAL | Status: DC
  Administered 2020-05-23 – 2020-05-29 (×7): 1 via ORAL
  Filled 2020-05-23 (×7): qty 1

## 2020-05-23 MED ORDER — POTASSIUM CHLORIDE CRYS ER 20 MEQ PO TBCR
40.0000 meq | EXTENDED_RELEASE_TABLET | ORAL | Status: DC
  Filled 2020-05-23: qty 2

## 2020-05-23 MED ORDER — SODIUM CHLORIDE 0.9 % IV SOLN
200.0000 mg | Freq: Once | INTRAVENOUS | Status: AC
  Administered 2020-05-23: 200 mg via INTRAVENOUS
  Filled 2020-05-23: qty 40

## 2020-05-23 MED ORDER — ISOSORBIDE MONONITRATE ER 30 MG PO TB24
30.0000 mg | ORAL_TABLET | Freq: Every day | ORAL | Status: DC
  Administered 2020-05-23 – 2020-05-29 (×7): 30 mg via ORAL
  Filled 2020-05-23 (×7): qty 1

## 2020-05-23 MED ORDER — AMLODIPINE BESYLATE 5 MG PO TABS
2.5000 mg | ORAL_TABLET | Freq: Every day | ORAL | Status: DC
  Administered 2020-05-23 – 2020-05-29 (×7): 2.5 mg via ORAL
  Filled 2020-05-23 (×7): qty 1

## 2020-05-23 MED ORDER — HYDROCORTISONE ACETATE 25 MG RE SUPP
25.0000 mg | Freq: Every day | RECTAL | Status: DC | PRN
  Filled 2020-05-23: qty 1

## 2020-05-23 MED ORDER — METOPROLOL SUCCINATE ER 25 MG PO TB24
12.5000 mg | ORAL_TABLET | Freq: Every day | ORAL | Status: DC
  Administered 2020-05-23 – 2020-05-29 (×7): 12.5 mg via ORAL
  Filled 2020-05-23 (×7): qty 1

## 2020-05-23 MED ORDER — ENSURE ENLIVE PO LIQD
237.0000 mL | Freq: Two times a day (BID) | ORAL | Status: DC
  Administered 2020-05-23 – 2020-05-29 (×9): 237 mL via ORAL

## 2020-05-23 MED ORDER — SODIUM CHLORIDE 0.9 % IV SOLN
100.0000 mg | Freq: Every day | INTRAVENOUS | Status: AC
  Administered 2020-05-24 – 2020-05-25 (×2): 100 mg via INTRAVENOUS
  Filled 2020-05-23 (×3): qty 20

## 2020-05-23 MED ORDER — PAROXETINE HCL 10 MG PO TABS
10.0000 mg | ORAL_TABLET | Freq: Every day | ORAL | Status: DC
  Administered 2020-05-23 – 2020-05-29 (×7): 10 mg via ORAL
  Filled 2020-05-23 (×7): qty 1

## 2020-05-23 MED ORDER — ZINC SULFATE 220 (50 ZN) MG PO CAPS
220.0000 mg | ORAL_CAPSULE | Freq: Every day | ORAL | Status: DC
  Administered 2020-05-23 – 2020-05-29 (×7): 220 mg via ORAL
  Filled 2020-05-23 (×7): qty 1

## 2020-05-23 MED ORDER — ASCORBIC ACID 500 MG PO TABS
500.0000 mg | ORAL_TABLET | Freq: Every day | ORAL | Status: DC
  Administered 2020-05-23 – 2020-05-29 (×7): 500 mg via ORAL
  Filled 2020-05-23 (×7): qty 1

## 2020-05-23 NOTE — Progress Notes (Signed)
PROGRESS NOTE    [name]  GGE:[id number] DOB: 1916/09/14 DOA: 05/22/2020 PCP: Josetta Huddle, MD  Brief Narrative: HPI per Dr. Jonelle Sidle on 05/22/2020 [name] is a 87 y.o. female with medical history significant of recurrent UTI, diastolic CHF, coronary artery disease, chronic kidney disease stage III, diverticular disease, hyperlipidemia, hypertension and prior GI bleed who was brought in from skilled facility was complaining of weakness abdominal pain.  No fever or chills.  No nausea vomiting or diarrhea.  Patient is not a good historian but records show that she had slight bleeding apparently in her last bowel movement.  Patient evaluated in the ER.  CT showed mainly sliding hiatal hernia and sigmoid diverticulosis but no diverticulitis.  Urinalysis consistent with acute UTI was positive nitrite bacteria.  Her fecal occult blood test is negative here.  Patient is being admitted with another episode of UTI...  ED Course: Temperature is 99.5 blood pressure 160/84, pulse 81 respiratory rate of 29 oxygen sat 94% room air.  Troponin is 25.  Chemistry largely within normal.  Lactic acid 2.4.  COVID-19 screen currently pending.  Chest x-ray showed no acute findings.  Urinalysis consistent with UTI with positive nitrite leukocyte and bacteria.  Patient being admitted with diagnosis of UTI symptomatic.   Assessment & Plan:   Principal Problem:   UTI (urinary tract infection) Active Problems:   Essential hypertension, benign   CKD (chronic kidney disease) stage 3, GFR 30-59 ml/min (HCC)   #1 UTI patient admitted with abdominal pain and weakness.  UA consistent with UTI.  Follow-up urine culture and continue IV Rocephin.  Patient has history of recurrent UTIs.  #2 incidental COVID-positive. Started remdesvir 4/26 for 3 doses   vit c zinc. crp d dimer in am Not hypoxic on room air.  #3 history of chronic diastolic CHF stable.  She is on Lasix at home which has not been restarted.   She is getting IV fluids for dehydration with ketones in the urine.  Reassess the need for IV fluids in a.m.  #4 history of essential hypertension-restarted Norvasc Imdur and Toprol.  #5 stage III CKD stable slow IV fluids being continued reassess in a.m.    #6 hypokalemia replete and recheck labs in AM. Mag 1.7 .   Estimated body mass index is 20.92 kg/m as calculated from the following:   Height as of this encounter: 5\' 11"  (1.803 m).   Weight as of this encounter: 68 kg.  DVT prophylaxis: Lovenox  code Status: DO NOT RESUSCITATE  family Communication: Discussed with her family [name] on the phone  disposition Plan:  Status is: Inpatient  Dispo: The patient is from: SNF              Anticipated d/c is to: SNF              Patient currently is not medically stable to d/c.   Difficult to place patient No   Consultants: None  Procedures: None Antimicrobials: None  Subjective: Patient is resting in bed very hard of hearing not in any distress  Objective: Vitals:   05/22/20 2000 05/22/20 2217 05/23/20 0155 05/23/20 0458  BP: (!) 144/73 (!) 134/58 136/67 128/66  Pulse: 80 70 67 63  Resp: 20 18 17 18   Temp: 99.5 F (37.5 C) 98.5 F (36.9 C) 98.1 F (36.7 C) 98.1 F (36.7 C)  TempSrc: Rectal Oral Oral Oral  SpO2: 95% 99% 96% 98%  Weight:      Height:  Intake/Output Summary (Last 24 hours) at 05/23/2020 1041 Last data filed at 05/23/2020 0700 Gross per 24 hour  Intake 603.13 ml  Output 0 ml  Net 603.13 ml   Filed Weights   05/22/20 1407  Weight: 68 kg    Examination:  General exam: Appears calm and comfortable  Respiratory system: Clear to auscultation. Respiratory effort normal. Cardiovascular system: S1 & S2 heard, RRR. No JVD, murmurs, rubs, gallops or clicks. No pedal edema. Gastrointestinal system: Abdomen is nondistended, soft and suprapubic tender. No organomegaly or masses felt. Normal bowel sounds heard. Central nervous system: Alert and  oriented. No focal neurological deficits. Extremities: Symmetric 5 x 5 power. Skin: No rashes, lesions or ulcers Psychiatry: Judgement and insight appear normal. Mood & affect appropriate.     Data Reviewed: I have personally reviewed following labs and imaging studies  CBC: Recent Labs  Lab 05/22/20 1451 05/23/20 0430  WBC 3.4* 3.3*  NEUTROABS 1.3*  --   HGB 13.1 12.0  HCT 40.4 39.7  MCV 94.6 99.7  PLT 125* 416*   Basic Metabolic Panel: Recent Labs  Lab 05/22/20 1730 05/23/20 0430  NA 139 136  K 3.9 3.0*  CL 104 106  CO2 21* 19*  GLUCOSE 81 92  BUN 18 17  CREATININE 0.94 0.84  CALCIUM 9.0 8.4*  MG  --  1.7   GFR: Estimated Creatinine Clearance: 37.3 mL/min (by C-G formula based on SCr of 0.84 mg/dL). Liver Function Tests: Recent Labs  Lab 05/22/20 1730 05/23/20 0430  AST 34 32  ALT 13 12  ALKPHOS 75 60  BILITOT 0.7 0.2*  PROT 8.0 6.8  ALBUMIN 3.7 3.1*   Recent Labs  Lab 05/22/20 1730  LIPASE 48   No results for input(s): AMMONIA in the last 168 hours. Coagulation Profile: No results for input(s): INR, PROTIME in the last 168 hours. Cardiac Enzymes: No results for input(s): CKTOTAL, CKMB, CKMBINDEX, TROPONINI in the last 168 hours. BNP (last 3 results) No results for input(s): PROBNP in the last 8760 hours. HbA1C: No results for input(s): HGBA1C in the last 72 hours. CBG: No results for input(s): GLUCAP in the last 168 hours. Lipid Profile: No results for input(s): CHOL, HDL, LDLCALC, TRIG, CHOLHDL, LDLDIRECT in the last 72 hours. Thyroid Function Tests: No results for input(s): TSH, T4TOTAL, FREET4, T3FREE, THYROIDAB in the last 72 hours. Anemia Panel: No results for input(s): VITAMINB12, FOLATE, FERRITIN, TIBC, IRON, RETICCTPCT in the last 72 hours. Sepsis Labs: Recent Labs  Lab 05/22/20 1451 05/22/20 1730  LATICACIDVEN 2.0* 2.4*    Recent Results (from the past 240 hour(s))  SARS CORONAVIRUS 2 (TAT 6-24 HRS) Nasopharyngeal  Nasopharyngeal Swab     Status: Abnormal   Collection Time: 05/22/20  7:08 PM   Specimen: Nasopharyngeal Swab  Result Value Ref Range Status   SARS Coronavirus 2 POSITIVE (A) NEGATIVE Final    Comment: (NOTE) SARS-CoV-2 target nucleic acids are DETECTED.  The SARS-CoV-2 RNA is generally detectable in upper and lower respiratory specimens during the acute phase of infection. Positive results are indicative of the presence of SARS-CoV-2 RNA. Clinical correlation with patient history and other diagnostic information is  necessary to determine patient infection status. Positive results do not rule out bacterial infection or co-infection with other viruses.  The expected result is Negative.  Fact Sheet for Patients: SugarRoll.be  Fact Sheet for Healthcare Providers: https://www.woods-.com/  This test is not yet approved or cleared by the [place] FDA and  has been authorized for  detection and/or diagnosis of SARS-CoV-2 by FDA under an Emergency Use Authorization (EUA). This EUA will remain  in effect (meaning this test can be used) for the duration of the COVID-19 declaration under Section 564(b)(1) of the Act, 21 U. S.C. section 360bbb-3(b)(1), unless the authorization is terminated or revoked sooner.   Performed at [facility], [place] [address], [place], [place] [address]          Radiology Studies: CT Abdomen Pelvis W Contrast  Result Date: 05/22/2020 CLINICAL DATA:  Abdominal pain.  No nausea or vomiting. EXAM: CT ABDOMEN AND PELVIS WITH CONTRAST TECHNIQUE: Multidetector CT imaging of the abdomen and pelvis was performed using the standard protocol following bolus administration of intravenous contrast. CONTRAST:  75 mL Omnipaque COMPARISON:  CT abdomen pelvis 03/11/2020 FINDINGS: Lower chest: Lung bases are clear. Hepatobiliary: No focal hepatic lesion. Gallbladder normal. No intrahepatic duct dilatation. Mild  extrahepatic biliary duct dilatation with the common bile duct measuring 9 mm and favored benign senescent dilatation. Pancreas: Pancreas is normal. No ductal dilatation. No pancreatic inflammation. Spleen: Normal spleen Adrenals/urinary tract: Adrenal glands and kidneys are normal. The ureters and bladder normal. Stomach/Bowel: Large hiatal hernia with approximately half the stomach above the hemidiaphragms. Sliding-type hiatal hernia. Duodenum small-bowel normal. Cecum is normal. Appendix not identified. Scattered diverticula of the descending colon. Multiple diverticula sigmoid colon. No acute inflammation. No bowel obstruction. Vascular/Lymphatic: Abdominal aorta is normal caliber with atherosclerotic calcification. There is no retroperitoneal or periportal lymphadenopathy. No pelvic lymphadenopathy. Reproductive: Post hysterectomy.  Adnexa unremarkable Other: No free fluid. Musculoskeletal: No aggressive osseous lesion. Degenerative osteophytosis of the spine. Chronic compression fracture at L1. IMPRESSION: 1. No acute findings in the abdomen pelvis. 2. Large sliding-type hiatal hernia. 3. Sigmoid diverticulosis without diverticulitis. 4. Extrahepatic biliary dilatation is favored benign senescent duct dilatation. 5. Extensive degenerative changes of the thoracolumbar spine. Electronically Signed   By: Suzy Bouchard M.D.   On: 05/22/2020 19:01   DG Chest Portable 1 View  Result Date: 05/22/2020 CLINICAL DATA:  Shortness of breath EXAM: PORTABLE CHEST 1 VIEW COMPARISON:  08/09/2019 FINDINGS: The heart size and mediastinal contours are within normal limits. Both lungs are clear. No pleural effusion or pneumothorax. The visualized skeletal structures are unremarkable. IMPRESSION: No acute process in the chest. Electronically Signed   By: Macy Mis M.D.   On: 05/22/2020 15:47        Scheduled Meds: . enoxaparin (LOVENOX) injection  40 mg Subcutaneous Q24H   Continuous Infusions: . sodium  chloride 75 mL/hr at 05/23/20 1038  . cefTRIAXone (ROCEPHIN)  IV    . potassium chloride       LOS: 1 day    Georgette Shell, MD [facility] 05/23/2020, 10:41 AM

## 2020-05-23 NOTE — Progress Notes (Signed)
Dr. Rodena Piety notified Patient refused her oral potassium

## 2020-05-23 NOTE — Progress Notes (Signed)
Patient Covid results came back positive, RN was not notified by lab. AC Joe was notified and order for transfer to 4W was placed.

## 2020-05-23 NOTE — Progress Notes (Signed)
Initial Nutrition Assessment  INTERVENTION:   -Ensure Enlive po BID, each supplement provides 350 kcal and 20 grams of protein  -Multivitamin with minerals daily  NUTRITION DIAGNOSIS:   Increased nutrient needs related to acute illness as evidenced by estimated needs.  GOAL:   Patient will meet greater than or equal to 90% of their needs  MONITOR:   PO intake,Supplement acceptance,Weight trends,Labs,I & O's  REASON FOR ASSESSMENT:   Malnutrition Screening Tool    ASSESSMENT:   87 y.o. female with medical history significant of recurrent UTI, diastolic CHF, coronary artery disease, chronic kidney disease stage III, diverticular disease, hyperlipidemia, hypertension and prior GI bleed who was brought in from skilled facility was complaining of weakness abdominal pain.  No fever or chills.  No nausea vomiting or diarrhea.  Patient is not a good historian but records show that she had slight bleeding apparently in her last bowel movement.  Patient evaluated in the ER.  CT showed mainly sliding hiatal hernia and sigmoid diverticulosis but no diverticulitis.  Urinalysis consistent with acute UTI was positive nitrite bacteria.  Her fecal occult blood test is negative here.  Patient is being admitted with another episode of UTI  Patient transferring to 4W at time of visit.  Pt currently COVID-19+. Pt refused breakfast this morning per RN documentation. Given acute illness and advanced age (89) pt would benefit from nutritional supplements. Will order Ensure.  Per weight records, pt has had no weight loss recently but unsure of accuracy of weight for admission.   Medications: KCl  Labs reviewed:  Low K  NUTRITION - FOCUSED PHYSICAL EXAM:  Transferring.  Diet Order:   Diet Order            Diet Heart Room service appropriate? Yes; Fluid consistency: Thin  Diet effective now                 EDUCATION NEEDS:   No education needs have been identified at this time  Skin:   Skin Assessment: Reviewed RN Assessment  Last BM:  4/25 -type 6 &7  Height:   Ht Readings from Last 1 Encounters:  05/22/20 5\' 11"  (1.803 m)    Weight:   Wt Readings from Last 1 Encounters:  05/22/20 68 kg   BMI:  Body mass index is 20.92 kg/m.  Estimated Nutritional Needs:   Kcal:  1500-1700  Protein:  65-75g  Fluid:  1.5L/day  Clayton Bibles, MS, RD, LDN Inpatient Clinical Dietitian Contact information available via Amion

## 2020-05-23 NOTE — Evaluation (Addendum)
Physical Therapy Evaluation Patient Details Name: [name] MRN: [id number] DOB: 1916/10/16 Today's Date: 05/23/2020   History of Present Illness  87 yo female admitted with weakness, UTI, COVID(+). Hx of vertigo, recurrent UTI, depression, sciatica, pulm HTN, CAD, CKD, CHF, shingles, DJD, orthostatic hypotension, falls, SDH  Clinical Impression  On eval, pt required Min-Mod A for mobility. She was able to stand and take a few side steps along the side of the bed with RW for support. Pt was tearful during session 2* pain from K+ infusion. Made RN aware. Pt was still fearful of pain so deferred attempt at ambulation around the room. Will plan to follow and progress activity as able.     Follow Up Recommendations SNF vs Home health PT at ALF;Supervision/Assistance - 24 hour (depends on progress and level of A ALF can provide)    Equipment Recommendations  None recommended by PT    Recommendations for Other Services       Precautions / Restrictions Precautions Precautions: Fall Restrictions Weight Bearing Restrictions: No      Mobility  Bed Mobility Overal bed mobility: Needs Assistance Bed Mobility: Supine to Sit     Supine to sit: Min guard;HOB elevated     General bed mobility comments: Increased time. Min guard for safety    Transfers Overall transfer level: Needs assistance Equipment used: Rolling walker (2 wheeled) Transfers: Sit to/from Stand Sit to Stand: Mod assist;From elevated surface         General transfer comment: Assist to rise, steady, control descent. Cues for safety, technique, hand placement.  Ambulation/Gait Ambulation/Gait assistance: Min assist   Assistive device: Rolling walker (2 wheeled)       General Gait Details: side steps along the side of bed with RW. Deferred ambulation around room at this time-pt upset about and fearful of pain 2* K+/IV infusion  Stairs            Wheelchair Mobility    Modified Rankin (Stroke  Patients Only)       Balance Overall balance assessment: Needs assistance         Standing balance support: Bilateral upper extremity supported Standing balance-Leahy Scale: Poor                               Pertinent Vitals/Pain Pain Assessment: Faces Faces Pain Scale: Hurts whole lot Pain Location: IV site with K+ infusing Pain Descriptors / Indicators: Crying Pain Intervention(s): Ice applied;Repositioned (made RN aware)    Home Living Family/patient expects to be discharged to:: Unsure     Type of Home: Assisted living Home Access: Level entry     Home Layout: One level Home Equipment: Walker - 4 wheels;Shower seat;Grab bars - tub/shower;Walker - 2 wheels;Hospital bed      Prior Function Level of Independence: Needs assistance   Gait / Transfers Assistance Needed: uses rollator for ambulation.  Had fall and SDH in July 2021. Pt denies any subsequent falls.  ADL's / Homemaking Assistance Needed: Pt reports staff assists with LE dressing as needed. Pt reports that she bathes and toilets herself and manages her own medication. Dining hall is significant distance so pt does have meals brought to room at times.  Uses transportation offered through [facility] and is active with their in-house PT and OT.        Hand Dominance        Extremity/Trunk Assessment   Upper Extremity Assessment Upper  Extremity Assessment: Defer to OT evaluation    Lower Extremity Assessment Lower Extremity Assessment: Generalized weakness    Cervical / Trunk Assessment Cervical / Trunk Assessment: Kyphotic  Communication   Communication: HOH  Cognition Arousal/Alertness: Awake/alert Behavior During Therapy: WFL for tasks assessed/performed Overall Cognitive Status: Within Functional Limits for tasks assessed                                        General Comments      Exercises     Assessment/Plan    PT Assessment Patient needs  continued PT services  PT Problem List Decreased strength;Decreased mobility;Decreased activity tolerance;Decreased balance;Decreased knowledge of use of DME;Pain       PT Treatment Interventions DME instruction;Gait training;Therapeutic exercise;Balance training;Functional mobility training;Therapeutic activities;Patient/family education    PT Goals (Current goals can be found in the Care Plan section)  Acute Rehab PT Goals Patient Stated Goal: less pain PT Goal Formulation: With patient Time For Goal Achievement: 06/06/20 Potential to Achieve Goals: Good    Frequency Min 3X/week   Barriers to discharge        Co-evaluation               AM-PAC PT "6 Clicks" Mobility  Outcome Measure Help needed turning from your back to your side while in a flat bed without using bedrails?: A Little Help needed moving from lying on your back to sitting on the side of a flat bed without using bedrails?: A Lot Help needed moving to and from a bed to a chair (including a wheelchair)?: A Little Help needed standing up from a chair using your arms (e.g., wheelchair or bedside chair)?: A Little Help needed to walk in hospital room?: A Lot Help needed climbing 3-5 steps with a railing? : A Lot 6 Click Score: 15    End of Session Equipment Utilized During Treatment: Gait belt Activity Tolerance: Patient limited by pain Patient left: in bed;with call bell/phone within reach;with bed alarm set   PT Visit Diagnosis: Pain;History of falling (Z91.81);Repeated falls (R29.6);Difficulty in walking, not elsewhere classified (R26.2);Muscle weakness (generalized) (M62.81)    Time: 3734-2876 PT Time Calculation (min) (ACUTE ONLY): 16 min   Charges:   PT Evaluation $PT Eval Moderate Complexity: 1 Mod             Doreatha Massed, PT Acute Rehabilitation  Office: [phone] Pager: [phone]

## 2020-05-24 DIAGNOSIS — I5042 Chronic combined systolic (congestive) and diastolic (congestive) heart failure: Secondary | ICD-10-CM

## 2020-05-24 DIAGNOSIS — N3000 Acute cystitis without hematuria: Secondary | ICD-10-CM

## 2020-05-24 DIAGNOSIS — R531 Weakness: Secondary | ICD-10-CM | POA: Diagnosis not present

## 2020-05-24 DIAGNOSIS — N1831 Chronic kidney disease, stage 3a: Secondary | ICD-10-CM | POA: Diagnosis not present

## 2020-05-24 DIAGNOSIS — I10 Essential (primary) hypertension: Secondary | ICD-10-CM | POA: Diagnosis not present

## 2020-05-24 LAB — COMPREHENSIVE METABOLIC PANEL
ALT: 9 U/L (ref 0–44)
AST: 34 U/L (ref 15–41)
Albumin: 2.9 g/dL — ABNORMAL LOW (ref 3.5–5.0)
Alkaline Phosphatase: 54 U/L (ref 38–126)
Anion gap: 9 (ref 5–15)
BUN: 11 mg/dL (ref 8–23)
CO2: 20 mmol/L — ABNORMAL LOW (ref 22–32)
Calcium: 8.2 mg/dL — ABNORMAL LOW (ref 8.9–10.3)
Chloride: 108 mmol/L (ref 98–111)
Creatinine, Ser: 0.83 mg/dL (ref 0.44–1.00)
GFR, Estimated: >60 mL/min (ref >60)
Glucose, Bld: 86 mg/dL (ref 70–99)
Potassium: 4 mmol/L (ref 3.5–5.1)
Sodium: 137 mmol/L (ref 135–145)
Total Bilirubin: 0.3 mg/dL (ref 0.3–1.2)
Total Protein: 6.4 g/dL — ABNORMAL LOW (ref 6.5–8.1)

## 2020-05-24 LAB — CBC
HCT: 34.8 % — ABNORMAL LOW (ref 36.0–46.0)
Hemoglobin: 11.5 g/dL — ABNORMAL LOW (ref 12.0–15.0)
MCH: 30.8 pg (ref 26.0–34.0)
MCHC: 33 g/dL (ref 30.0–36.0)
MCV: 93.3 fL (ref 80.0–100.0)
Platelets: 99 10*3/uL — ABNORMAL LOW (ref 150–400)
RBC: 3.73 MIL/uL — ABNORMAL LOW (ref 3.87–5.11)
RDW: 14 % (ref 11.5–15.5)
WBC: 3.1 10*3/uL — ABNORMAL LOW (ref 4.0–10.5)
nRBC: 0 % (ref 0.0–0.2)

## 2020-05-24 LAB — LACTIC ACID, PLASMA: Lactic Acid, Venous: 2 mmol/L (ref 0.5–1.9)

## 2020-05-24 MED ORDER — ONDANSETRON HCL 4 MG/2ML IJ SOLN
4.0000 mg | Freq: Three times a day (TID) | INTRAMUSCULAR | Status: DC | PRN
  Administered 2020-05-24: 4 mg via INTRAVENOUS
  Filled 2020-05-24: qty 2

## 2020-05-24 MED ORDER — POLYETHYLENE GLYCOL 3350 17 G PO PACK
17.0000 g | PACK | Freq: Two times a day (BID) | ORAL | Status: DC | PRN
  Administered 2020-05-29: 17 g via ORAL
  Filled 2020-05-24: qty 1

## 2020-05-24 MED ORDER — SENNOSIDES-DOCUSATE SODIUM 8.6-50 MG PO TABS
1.0000 | ORAL_TABLET | Freq: Two times a day (BID) | ORAL | Status: DC | PRN
  Administered 2020-05-29: 1 via ORAL
  Filled 2020-05-24: qty 1

## 2020-05-24 NOTE — TOC Progression Note (Signed)
Transition of Care [facility]) - Progression Note    Patient Details  Name: [name] MRN: [id number] Date of Birth: July 30, 1916  Transition of [facility]) CM/SW Contact  Ross Ludwig, [facility] Phone Number: 05/24/2020, 6:05 PM  Clinical Narrative:     Patient is from [facility].  Plan to return back with [facility] services.       Expected Discharge Plan and Services                                                 Social Determinants of Health (SDOH) Interventions    Readmission Risk Interventions Readmission Risk Prevention Plan 03/13/2020  Medication Screening Complete  Transportation Screening Complete  Some recent data might be hidden

## 2020-05-24 NOTE — Progress Notes (Signed)
PROGRESS NOTE  [name] XBD:[id number] DOB: [birth year]   PCP: Josetta Huddle, MD  Patient is from: ALF   DOA: 05/22/2020 LOS: 2  Chief complaints: Generalized weakness  Brief Narrative / Interim history: 87 year old F with PMH of recurrent UTI, diastolic CHF, CAD, CKD-3, prior GI bleed, diverticulosis, HTN and HLD presenting with generalized weakness and abdominal pain and admitted for UTI.  She also tested positive for COVID-19 but had no respiratory symptoms.  Subjective: Seen and examined earlier this morning.  She is awake and oriented to self, place and situation.  Feels "terrible".  Could not be more specific.  She denies chest pain, shortness of breath or cough.  Denies GI symptoms other than some suprapubic discomfort.  Objective: Vitals:   05/23/20 2039 05/24/20 0521 05/24/20 0927 05/24/20 1215  BP: 110/61 118/60 120/62 119/68  Pulse: 66 (!) 59 60 75  Resp: 19 17  16   Temp: 98.5 F (36.9 C) 98 F (36.7 C)  98.4 F (36.9 C)  TempSrc: Oral Oral    SpO2: 98% 98%  98%  Weight:      Height:        Intake/Output Summary (Last 24 hours) at 05/24/2020 1347 Last data filed at 05/24/2020 0700 Gross per 24 hour  Intake 1169.71 ml  Output --  Net 1169.71 ml   Filed Weights   05/22/20 1407  Weight: 68 kg    Examination:  GENERAL: No apparent distress.  Nontoxic. HEENT: MMM.  Vision and hearing grossly intact.  NECK: Supple.  No apparent JVD.  RESP: On RA.  No IWOB.  Fair aeration bilaterally. CVS:  RRR. Heart sounds normal.  ABD/GI/GU: BS+. Abd soft.  Diffuse tenderness. MSK/EXT:  Moves extremities. No apparent deformity. No edema.  SKIN: no apparent skin lesion or wound NEURO: Awake, alert and oriented to self, place and situation.  No apparent focal neuro deficit. PSYCH: Calm. Normal affect.  Procedures:  None  Microbiology summarized: 4/25-COVID-19 PCR positive 4/25-urine culture was E. coli  Assessment & Plan: E. coli UTI-history of recurrent  UTIs.  Presented with abdominal pain and weakness -Continue IV ceftriaxone pending culture sensitivities  COVID-19 infection-has generalized weakness and abdominal pain but no respiratory symptoms. -Continue IV remdesivir 4/26-4/28 -Supportive care  Chronic combined CHF: TTE in 2018 with LVEF of 45 to 83%, diastolic dysfunction and PAPP of 42.   Appears to be dehydrated.  Does not seem to be on diuretics at home. -Continue holding Lasix -Continue IV fluids -Monitor fluid and respiratory status -Continue home Imdur, metoprolol  Essential hypertension: Normotensive for most part -Continue home amlodipine, Imdur and Toprol  CKD-3A: Stable -Continue monitoring  Hypokalemia/hypomagnesemia: Resolved.:  Resolved.  Lactic acidosis: LA 2.0 -Continue IV fluids  Pancytopenia: Anemia stable. Recent Labs    03/11/20 0633 03/11/20 1415 03/11/20 2037 03/12/20 0627 03/13/20 0708 03/14/20 0551 03/15/20 0551 05/22/20 1451 05/23/20 0430 05/24/20 0548  HGB 11.0* 16.6* 10.9* 11.6* 12.0 11.2* 11.0* 13.1 12.0 11.5*   Thrombocytopenia Recent Labs  Lab 05/22/20 1451 05/23/20 0430 05/24/20 0548  PLT 125* 120* 99*  -Continue monitoring -May have to hold Lovenox if it continues to trend down  Increase nutrition needs Body mass index is 20.92 kg/m. Nutrition Problem: Increased nutrient needs Etiology: acute illness Signs/Symptoms: estimated needs Interventions: Ensure Enlive (each supplement provides 350kcal and 20 grams of protein),MVI   DVT prophylaxis:  enoxaparin (LOVENOX) injection 40 mg Start: 05/22/20 2300  Code Status: DNR/DNI Family Communication: Patient and/or RN. Available if any question. Level of  care: Med-Surg Status is: Inpatient  Remains inpatient appropriate because:Ongoing diagnostic testing needed not appropriate for outpatient work up, Unsafe d/c plan, IV treatments appropriate due to intensity of illness or inability to take PO and Inpatient level of care  appropriate due to severity of illness   Dispo: The patient is from: ALF              Anticipated d/c is to: ALF with [facility]              Patient currently is not medically stable to d/c.   Difficult to place patient No       Consultants:  None   Sch Meds:  Scheduled Meds: . amLODipine  2.5 mg Oral Daily  . vitamin C  500 mg Oral Daily  . enoxaparin (LOVENOX) injection  40 mg Subcutaneous Q24H  . feeding supplement  237 mL Oral BID BM  . isosorbide mononitrate  30 mg Oral Daily  . metoprolol succinate  12.5 mg Oral Daily  . multivitamin with minerals  1 tablet Oral Daily  . PARoxetine  10 mg Oral Daily  . zinc sulfate  220 mg Oral Daily   Continuous Infusions: . sodium chloride 75 mL/hr at 05/23/20 1038  . cefTRIAXone (ROCEPHIN)  IV 1 g (05/23/20 1716)  . remdesivir 100 mg in NS 100 mL 100 mg (05/24/20 0931)   PRN Meds:.hydrocortisone  Antimicrobials: Anti-infectives (From admission, onward)   Start     Dose/Rate Route Frequency Ordered Stop   05/24/20 1000  remdesivir 100 mg in sodium chloride 0.9 % 100 mL IVPB       "Followed by" Linked Group Details   100 mg 200 mL/hr over 30 Minutes Intravenous Daily 05/23/20 1736 05/26/20 0959   05/23/20 1830  remdesivir 200 mg in sodium chloride 0.9% 250 mL IVPB       "Followed by" Linked Group Details   200 mg 580 mL/hr over 30 Minutes Intravenous Once 05/23/20 1736 05/24/20 0458   05/23/20 1800  cefTRIAXone (ROCEPHIN) 1 g in sodium chloride 0.9 % 100 mL IVPB        1 g 200 mL/hr over 30 Minutes Intravenous Every 24 hours 05/22/20 2227     05/22/20 1745  cefTRIAXone (ROCEPHIN) 2 g in sodium chloride 0.9 % 100 mL IVPB        2 g 200 mL/hr over 30 Minutes Intravenous  Once 05/22/20 1733 05/22/20 1822       I have personally reviewed the following labs and images: CBC: Recent Labs  Lab 05/22/20 1451 05/23/20 0430 05/24/20 0548  WBC 3.4* 3.3* 3.1*  NEUTROABS 1.3*  --   --   HGB 13.1 12.0 11.5*  HCT 40.4 39.7 34.8*   MCV 94.6 99.7 93.3  PLT 125* 120* 99*   BMP &GFR Recent Labs  Lab 05/22/20 1730 05/23/20 0430 05/24/20 0548  NA 139 136 137  K 3.9 3.0* 4.0  CL 104 106 108  CO2 21* 19* 20*  GLUCOSE 81 92 86  BUN 18 17 11   CREATININE 0.94 0.84 0.83  CALCIUM 9.0 8.4* 8.2*  MG  --  1.7  --    Estimated Creatinine Clearance: 37.7 mL/min (by C-G formula based on SCr of 0.83 mg/dL). Liver & Pancreas: Recent Labs  Lab 05/22/20 1730 05/23/20 0430 05/24/20 0548  AST 34 32 34  ALT 13 12 9   ALKPHOS 75 60 54  BILITOT 0.7 0.2* 0.3  PROT 8.0 6.8 6.4*  ALBUMIN 3.7 3.1* 2.9*  Recent Labs  Lab 05/22/20 1730  LIPASE 48   No results for input(s): AMMONIA in the last 168 hours. Diabetic: No results for input(s): HGBA1C in the last 72 hours. No results for input(s): GLUCAP in the last 168 hours. Cardiac Enzymes: No results for input(s): CKTOTAL, CKMB, CKMBINDEX, TROPONINI in the last 168 hours. No results for input(s): PROBNP in the last 8760 hours. Coagulation Profile: No results for input(s): INR, PROTIME in the last 168 hours. Thyroid Function Tests: No results for input(s): TSH, T4TOTAL, FREET4, T3FREE, THYROIDAB in the last 72 hours. Lipid Profile: No results for input(s): CHOL, HDL, LDLCALC, TRIG, CHOLHDL, LDLDIRECT in the last 72 hours. Anemia Panel: No results for input(s): VITAMINB12, FOLATE, FERRITIN, TIBC, IRON, RETICCTPCT in the last 72 hours. Urine analysis:    Component Value Date/Time   COLORURINE YELLOW 05/22/2020 1451   APPEARANCEUR HAZY (A) 05/22/2020 1451   LABSPEC 1.018 05/22/2020 1451   PHURINE 5.0 05/22/2020 1451   GLUCOSEU NEGATIVE 05/22/2020 1451   HGBUR MODERATE (A) 05/22/2020 1451   BILIRUBINUR NEGATIVE 05/22/2020 1451   KETONESUR 20 (A) 05/22/2020 1451   PROTEINUR 100 (A) 05/22/2020 1451   UROBILINOGEN 0.2 09/18/2014 1600   NITRITE POSITIVE (A) 05/22/2020 1451   LEUKOCYTESUR NEGATIVE 05/22/2020 1451   Sepsis Labs: Invalid input(s): PROCALCITONIN,  [place]  Microbiology: Recent Results (from the past 240 hour(s))  Urine culture     Status: Abnormal (Preliminary result)   Collection Time: 05/22/20  2:51 PM   Specimen: Urine, Random  Result Value Ref Range Status   Specimen Description   Final    URINE, RANDOM Performed at [facility] [address] [place], [place] [address]    Special Requests   Final    NONE Performed at [facility], [place] [address] [place], [place] [address]    Culture (A)  Final    >=100,000 COLONIES/mL ESCHERICHIA COLI SUSCEPTIBILITIES TO FOLLOW CULTURE REINCUBATED FOR BETTER GROWTH Performed at [facility], [place] [address], [place], [place] [address]    Report Status PENDING  Incomplete  SARS CORONAVIRUS 2 (TAT 6-24 HRS) Nasopharyngeal Nasopharyngeal Swab     Status: Abnormal   Collection Time: 05/22/20  7:08 PM   Specimen: Nasopharyngeal Swab  Result Value Ref Range Status   SARS Coronavirus 2 POSITIVE (A) NEGATIVE Final    Comment: (NOTE) SARS-CoV-2 target nucleic acids are DETECTED.  The SARS-CoV-2 RNA is generally detectable in upper and lower respiratory specimens during the acute phase of infection. Positive results are indicative of the presence of SARS-CoV-2 RNA. Clinical correlation with patient history and other diagnostic information is  necessary to determine patient infection status. Positive results do not rule out bacterial infection or co-infection with other viruses.  The expected result is Negative.  Fact Sheet for Patients: SugarRoll.be  Fact Sheet for Healthcare Providers: https://www.woods-mathews.com/  This test is not yet approved or cleared by the [place] FDA and  has been authorized for detection and/or diagnosis of SARS-CoV-2 by FDA under an Emergency Use Authorization (EUA). This EUA will remain  in effect (meaning this test can be used) for the duration of  the COVID-19 declaration under Section 564(b)(1) of the Act, 21 U. S.C. section 360bbb-3(b)(1), unless the authorization is terminated or revoked sooner.   Performed at [facility], [place] [address], [place], [place] [address]     Radiology Studies: No results found.    Julaine Zimny T. [place]  If 7PM-7AM, please contact  night-coverage [email or web address] 05/24/2020, 1:47 PM

## 2020-05-24 NOTE — Progress Notes (Signed)
OT Cancellation Note  Patient Details Name: YILIA SACCA MRN: [id number] DOB: 1916-07-05   Cancelled Treatment:    Reason Eval/Treat Not Completed: Other (comment). Patient having episode of nausea and vomiting. RN just medicated. Will f/u as able.   Albena Comes L Ivett [place] 05/24/2020, 4:05 PM

## 2020-05-25 DIAGNOSIS — N3001 Acute cystitis with hematuria: Secondary | ICD-10-CM | POA: Diagnosis not present

## 2020-05-25 DIAGNOSIS — R531 Weakness: Secondary | ICD-10-CM | POA: Diagnosis not present

## 2020-05-25 DIAGNOSIS — N3000 Acute cystitis without hematuria: Secondary | ICD-10-CM | POA: Diagnosis not present

## 2020-05-25 DIAGNOSIS — N1831 Chronic kidney disease, stage 3a: Secondary | ICD-10-CM | POA: Diagnosis not present

## 2020-05-25 LAB — CBC
HCT: 34.7 % — ABNORMAL LOW (ref 36.0–46.0)
Hemoglobin: 10.9 g/dL — ABNORMAL LOW (ref 12.0–15.0)
MCH: 30.5 pg (ref 26.0–34.0)
MCHC: 31.4 g/dL (ref 30.0–36.0)
MCV: 97.2 fL (ref 80.0–100.0)
Platelets: 118 10*3/uL — ABNORMAL LOW (ref 150–400)
RBC: 3.57 MIL/uL — ABNORMAL LOW (ref 3.87–5.11)
RDW: 14 % (ref 11.5–15.5)
WBC: 3.3 10*3/uL — ABNORMAL LOW (ref 4.0–10.5)
nRBC: 0 % (ref 0.0–0.2)

## 2020-05-25 LAB — RENAL FUNCTION PANEL
Albumin: 2.8 g/dL — ABNORMAL LOW (ref 3.5–5.0)
Anion gap: 7 (ref 5–15)
BUN: 13 mg/dL (ref 8–23)
CO2: 21 mmol/L — ABNORMAL LOW (ref 22–32)
Calcium: 8.5 mg/dL — ABNORMAL LOW (ref 8.9–10.3)
Chloride: 111 mmol/L (ref 98–111)
Creatinine, Ser: 0.9 mg/dL (ref 0.44–1.00)
GFR, Estimated: 57 mL/min — ABNORMAL LOW (ref >60)
Glucose, Bld: 80 mg/dL (ref 70–99)
Phosphorus: 2.7 mg/dL (ref 2.5–4.6)
Potassium: 3.7 mmol/L (ref 3.5–5.1)
Sodium: 139 mmol/L (ref 135–145)

## 2020-05-25 LAB — LACTIC ACID, PLASMA: Lactic Acid, Venous: 1.1 mmol/L (ref 0.5–1.9)

## 2020-05-25 LAB — MAGNESIUM: Magnesium: 1.7 mg/dL (ref 1.7–2.4)

## 2020-05-25 MED ORDER — ACETAMINOPHEN 325 MG PO TABS
650.0000 mg | ORAL_TABLET | Freq: Four times a day (QID) | ORAL | Status: DC | PRN
  Administered 2020-05-25 – 2020-05-28 (×6): 650 mg via ORAL
  Filled 2020-05-25 (×6): qty 2

## 2020-05-25 MED ORDER — GUAIFENESIN-DM 100-10 MG/5ML PO SYRP
5.0000 mL | ORAL_SOLUTION | ORAL | Status: DC | PRN
  Administered 2020-05-25 – 2020-05-28 (×3): 5 mL via ORAL
  Filled 2020-05-25 (×4): qty 10

## 2020-05-25 MED ORDER — POTASSIUM CHLORIDE CRYS ER 20 MEQ PO TBCR
40.0000 meq | EXTENDED_RELEASE_TABLET | Freq: Once | ORAL | Status: AC
  Administered 2020-05-25: 40 meq via ORAL
  Filled 2020-05-25: qty 2

## 2020-05-25 MED ORDER — MAGNESIUM SULFATE 2 GM/50ML IV SOLN
2.0000 g | Freq: Once | INTRAVENOUS | Status: AC
  Administered 2020-05-25: 2 g via INTRAVENOUS
  Filled 2020-05-25: qty 50

## 2020-05-25 NOTE — TOC Initial Note (Signed)
Transition of Care [facility]) - Initial/Assessment Note    Patient Details  Name: [name] MRN: [id number] Date of Birth: [birth year]  [facility] CM/SW Contact:    Trish Mage, LCSW Phone Number: 05/25/2020, 2:16 PM  Clinical Narrative:   Resident of [place] positive, is in need of short term rehab.  Spoke with niece, who is also HC POA, who agrees with SNF placement, prefers [facility].  Bed search initiated. TOC will continue to follow during the course of hospitalization.                Expected Discharge Plan: Skilled Nursing Facility Barriers to Discharge: SNF Pending bed offer   Patient Goals and CMS Choice     Choice offered to / list presented to : [facility] POA / Guardian  Expected Discharge Plan and Services Expected Discharge Plan: [place]   Discharge Planning Services: CM Consult Post Acute Care Choice: [place] Living arrangements for the past 2 months: [place]                                      Prior Living Arrangements/Services Living arrangements for the past 2 months: [place] Lives with:: Facility Resident Patient language and need for interpreter reviewed:: Yes        Need for Family Participation in Patient Care: Yes (Comment) Care giver support system in place?: Yes (comment) Current home services: DME Criminal Activity/Legal Involvement Pertinent to Current Situation/Hospitalization: No - Comment as needed  Activities of Daily Living Home Assistive Devices/Equipment: Engineer, drilling (specify type),Dentures (specify type) (upper and lower dentures, rolator) ADL Screening (condition at time of admission) Patient's cognitive ability adequate to safely complete daily activities?: Yes Is the patient deaf or have difficulty hearing?: Yes Does the patient have difficulty seeing, even when wearing glasses/contacts?: No Does the patient have  difficulty concentrating, remembering, or making decisions?: No Patient able to express need for assistance with ADLs?: Yes Does the patient have difficulty dressing or bathing?: Yes Independently performs ADLs?: No Communication: Independent Dressing (OT): Needs assistance Is this a change from baseline?: Change from baseline, expected to last >3 days Grooming: Independent Feeding: Independent Bathing: Needs assistance Is this a change from baseline?: Change from baseline, expected to last >3 days Toileting: Needs assistance Is this a change from baseline?: Change from baseline, expected to last >3days In/Out Bed: Needs assistance Is this a change from baseline?: Change from baseline, expected to last >3 days Walks in Home: Needs assistance Is this a change from baseline?: Change from baseline, expected to last >3 days Does the patient have difficulty walking or climbing stairs?: Yes Weakness of Legs: Both Weakness of Arms/Hands: Both  Permission Sought/Granted Permission sought to share information with : Family Supports Permission granted to share information with : No  Share Information with NAME: Duwaine Maxin (Relative)   [phone]           Emotional Assessment         Alcohol / Substance Use: Not Applicable Psych Involvement: No (comment)  Admission diagnosis:  UTI (urinary tract infection) [N39.0] Weakness [R53.1] Acute cystitis without hematuria [N30.00] Patient Active Problem List   Diagnosis Date Noted  . UTI (urinary tract infection) 05/22/2020  . Rectal bleeding 03/11/2020  . Hematochezia 03/11/2020  . Orthostatic hypotension 08/13/2019  . Falls, sequela 08/13/2019  . DNR (do not  resuscitate) 08/11/2019  . SDH (subdural hematoma) ([place]) 08/09/2019  . Acute lower UTI 01/02/2018  . Acute cystitis without hematuria   . Acute encephalopathy 01/01/2018  . DJD (degenerative joint disease) 08/09/2016  . GI bleed 08/08/2016  . Abnormal echocardiogram  06/14/2016  . PVC's (premature ventricular contractions)   . Bacteremia 09/18/2014  . Fever   . Febrile illness 09/12/2014  . Diverticulitis 09/12/2014  . CKD (chronic kidney disease) stage 3, GFR 30-59 ml/min (HCC) 09/12/2014  . HTN (hypertension) 09/12/2014  . Thrombocytopenia ([place]) 09/12/2014  . Abnormal stress test 01/13/2014  . Rectal bleed 08/10/2013  . Acute renal failure ([place]) 08/10/2013  . Essential hypertension, benign 01/08/2013  . DOE (dyspnea on exertion) 01/08/2013   PCP:  Josetta Huddle, MD Pharmacy:   [facility] [place], [place] - [address] [place] [address] Phone: [phone] Fax: [phone]     Social Determinants of Health (SDOH) Interventions    Readmission Risk Interventions Readmission Risk Prevention Plan 03/13/2020  Medication Screening Complete  Transportation Screening Complete  Some recent data might be hidden

## 2020-05-25 NOTE — Care Management Important Message (Signed)
Important Message  Patient Details IM Letter given to the Patient. Name: DIANEY SUCHY MRN: [id number] Date of Birth: [birth year]   Medicare Important Message Given:  Yes     Kerin Salen 05/25/2020, 11:23 AM

## 2020-05-25 NOTE — Progress Notes (Signed)
PROGRESS NOTE  [name] DVV:[id number] DOB: 21-Sep-1916   PCP: Josetta Huddle, MD  Patient is from: ALF   DOA: 05/22/2020 LOS: 3  Chief complaints: Generalized weakness  Brief Narrative / Interim history: 87 year old F with PMH of recurrent UTI, diastolic CHF, CAD, CKD-3, prior GI bleed, diverticulosis, HTN and HLD presenting with generalized weakness and abdominal pain and admitted for UTI.  She also tested positive for COVID-19 but no significant respiratory symptoms other than DOA.  She is saturating 100% on RA.  She is vaccinated x2 but no booster.  She completed 3 days of remdesivir on 4/28.   Therapy recommended SNF  Subjective: Seen and examined earlier this morning.  She was sitting on bedside chair.  Continues to endorse generalized weakness.  Also reports shortness of breath with exertion.  Denies chest pain, nausea, vomiting or abdominal pain.  Objective: Vitals:   05/24/20 2118 05/25/20 0428 05/25/20 0837 05/25/20 1217  BP: (!) 104/59 (!) 119/54  129/62  Pulse: 66 (!) 57  (!) 55  Resp: 14 16  (!) 21  Temp: 99 F (37.2 C) 97.8 F (36.6 C)  98 F (36.7 C)  TempSrc: Oral Oral  Oral  SpO2: 97% 97% 100% 97%  Weight:      Height:        Intake/Output Summary (Last 24 hours) at 05/25/2020 1221 Last data filed at 05/25/2020 6269 Gross per 24 hour  Intake 2029.71 ml  Output 300 ml  Net 1729.71 ml   Filed Weights   05/22/20 1407  Weight: 68 kg    Examination:  GENERAL: No apparent distress.  Nontoxic. HEENT: MMM.  Vision grossly intact.  Diminished hearing. NECK: Supple.  No apparent JVD.  RESP: 100% on RA.  No IWOB.  Fair aeration bilaterally. CVS:  RRR. Heart sounds normal.  ABD/GI/GU: BS+. Abd soft.  Some discomfort over lower abdomen with palpation. MSK/EXT:  Moves extremities. No apparent deformity. No edema.  SKIN: no apparent skin lesion or wound NEURO: Awake, alert and oriented appropriately.  No apparent focal neuro deficit. PSYCH: Calm.  Normal affect.   Procedures:  None  Microbiology summarized: 4/25-COVID-19 PCR positive 4/25-urine culture was E. coli  Assessment & Plan: E. coli UTI-history of recurrent UTIs.  Presented with abdominal pain and weakness.  Urine culture with E. coli resistant to ampicillin, Cipro, Bactrim and Unasyn. -Continue IV ceftriaxone to complete 5 days course  COVID-19 infection-has generalized weakness and dyspnea on exertion but no desaturation. -Completed 3 days of IV remdesivir 4/26-4/28 -Supportive care  Chronic combined CHF: TTE in 2018 with LVEF of 45 to 48%, diastolic dysfunction and PAPP of 42.  Appears euvolemic.  Does not seem to be on diuretics at home. -Continue holding Lasix -Discontinue IV fluid -Monitor fluid and respiratory status -Continue home Imdur, metoprolol   Essential hypertension: Normotensive for most part -Continue home amlodipine, Imdur and Toprol  CKD-3A: Stable -Continue monitoring  Hypokalemia/hypomagnesemia: Improved. -K-Dur 40 mill equivalent x1 -IV magnesium 2 g x 1  Lactic acidosis: Resolved. -Discontinue IV fluid  Normocytic anemia: Drop in Hgb likely dilutional. Recent Labs    03/11/20 1415 03/11/20 2037 03/12/20 0627 03/13/20 0708 03/14/20 0551 03/15/20 0551 05/22/20 1451 05/23/20 0430 05/24/20 0548 05/25/20 0359  HGB 16.6* 10.9* 11.6* 12.0 11.2* 11.0* 13.1 12.0 11.5* 10.9*  -Continue monitoring  Thrombocytopenia: Likely due to gram-negative infection.  Improving. Recent Labs  Lab 05/22/20 1451 05/23/20 0430 05/24/20 0548 05/25/20 0359  PLT 125* 120* 99* 118*  -Continue monitoring -May  have to hold Lovenox if it continues to trend down  Leukopenia: Improving. -Continue monitoring  Generalized weakness/physical deconditioning-patient's niece, [facility] POA concerned about patient's weakness and ability to return to ALF.  Therapy recommended SNF. -TOC consulted for SNF  Increase nutrition needs Body mass index is 20.92  kg/m. Nutrition Problem: Increased nutrient needs Etiology: acute illness Signs/Symptoms: estimated needs Interventions: Ensure Enlive (each supplement provides 350kcal and 20 grams of protein),MVI   DVT prophylaxis:  enoxaparin (LOVENOX) injection 40 mg Start: 05/22/20 2300  Code Status: DNR/DNI Family Communication: Updated patient's niece, [facility] POA over the phone. Level of care: Med-Surg Status is: Inpatient  Remains inpatient appropriate because:Unsafe d/c plan and IV treatments appropriate due to intensity of illness or inability to take PO   Dispo: The patient is from: ALF              Anticipated d/c is to: SNF.              Patient currently is medically stable to d/c.   Difficult to place patient No       Consultants:  None   Sch Meds:  Scheduled Meds: . amLODipine  2.5 mg Oral Daily  . vitamin C  500 mg Oral Daily  . enoxaparin (LOVENOX) injection  40 mg Subcutaneous Q24H  . feeding supplement  237 mL Oral BID BM  . isosorbide mononitrate  30 mg Oral Daily  . metoprolol succinate  12.5 mg Oral Daily  . multivitamin with minerals  1 tablet Oral Daily  . PARoxetine  10 mg Oral Daily  . zinc sulfate  220 mg Oral Daily   Continuous Infusions: . cefTRIAXone (ROCEPHIN)  IV 1 g (05/24/20 1714)   PRN Meds:.acetaminophen, hydrocortisone, ondansetron (ZOFRAN) IV, polyethylene glycol, senna-docusate  Antimicrobials: Anti-infectives (From admission, onward)   Start     Dose/Rate Route Frequency Ordered Stop   05/24/20 1000  remdesivir 100 mg in sodium chloride 0.9 % 100 mL IVPB       "Followed by" Linked Group Details   100 mg 200 mL/hr over 30 Minutes Intravenous Daily 05/23/20 1736 05/25/20 1120   05/23/20 1830  remdesivir 200 mg in sodium chloride 0.9% 250 mL IVPB       "Followed by" Linked Group Details   200 mg 580 mL/hr over 30 Minutes Intravenous Once 05/23/20 1736 05/24/20 0458   05/23/20 1800  cefTRIAXone (ROCEPHIN) 1 g in sodium chloride 0.9 % 100 mL  IVPB        1 g 200 mL/hr over 30 Minutes Intravenous Every 24 hours 05/22/20 2227     05/22/20 1745  cefTRIAXone (ROCEPHIN) 2 g in sodium chloride 0.9 % 100 mL IVPB        2 g 200 mL/hr over 30 Minutes Intravenous  Once 05/22/20 1733 05/22/20 1822       I have personally reviewed the following labs and images: CBC: Recent Labs  Lab 05/22/20 1451 05/23/20 0430 05/24/20 0548 05/25/20 0359  WBC 3.4* 3.3* 3.1* 3.3*  NEUTROABS 1.3*  --   --   --   HGB 13.1 12.0 11.5* 10.9*  HCT 40.4 39.7 34.8* 34.7*  MCV 94.6 99.7 93.3 97.2  PLT 125* 120* 99* 118*   BMP &GFR Recent Labs  Lab 05/22/20 1730 05/23/20 0430 05/24/20 0548 05/25/20 0359  NA 139 136 137 139  K 3.9 3.0* 4.0 3.7  CL 104 106 108 111  CO2 21* 19* 20* 21*  GLUCOSE 81 92 86 80  BUN  18 17 11 13   CREATININE 0.94 0.84 0.83 0.90  CALCIUM 9.0 8.4* 8.2* 8.5*  MG  --  1.7  --  1.7  PHOS  --   --   --  2.7   Estimated Creatinine Clearance: 34.8 mL/min (by C-G formula based on SCr of 0.9 mg/dL). Liver & Pancreas: Recent Labs  Lab 05/22/20 1730 05/23/20 0430 05/24/20 0548 05/25/20 0359  AST 34 32 34  --   ALT 13 12 9   --   ALKPHOS 75 60 54  --   BILITOT 0.7 0.2* 0.3  --   PROT 8.0 6.8 6.4*  --   ALBUMIN 3.7 3.1* 2.9* 2.8*   Recent Labs  Lab 05/22/20 1730  LIPASE 48   No results for input(s): AMMONIA in the last 168 hours. Diabetic: No results for input(s): HGBA1C in the last 72 hours. No results for input(s): GLUCAP in the last 168 hours. Cardiac Enzymes: No results for input(s): CKTOTAL, CKMB, CKMBINDEX, TROPONINI in the last 168 hours. No results for input(s): PROBNP in the last 8760 hours. Coagulation Profile: No results for input(s): INR, PROTIME in the last 168 hours. Thyroid Function Tests: No results for input(s): TSH, T4TOTAL, FREET4, T3FREE, THYROIDAB in the last 72 hours. Lipid Profile: No results for input(s): CHOL, HDL, LDLCALC, TRIG, CHOLHDL, LDLDIRECT in the last 72 hours. Anemia  Panel: No results for input(s): VITAMINB12, FOLATE, FERRITIN, TIBC, IRON, RETICCTPCT in the last 72 hours. Urine analysis:    Component Value Date/Time   COLORURINE YELLOW 05/22/2020 1451   APPEARANCEUR HAZY (A) 05/22/2020 1451   LABSPEC 1.018 05/22/2020 1451   PHURINE 5.0 05/22/2020 1451   GLUCOSEU NEGATIVE 05/22/2020 1451   HGBUR MODERATE (A) 05/22/2020 1451   BILIRUBINUR NEGATIVE 05/22/2020 1451   KETONESUR 20 (A) 05/22/2020 1451   PROTEINUR 100 (A) 05/22/2020 1451   UROBILINOGEN 0.2 09/18/2014 1600   NITRITE POSITIVE (A) 05/22/2020 1451   LEUKOCYTESUR NEGATIVE 05/22/2020 1451   Sepsis Labs: Invalid input(s): PROCALCITONIN, [place]  Microbiology: Recent Results (from the past 240 hour(s))  Urine culture     Status: Abnormal (Preliminary result)   Collection Time: 05/22/20  2:51 PM   Specimen: Urine, Random  Result Value Ref Range Status   Specimen Description   Final    URINE, RANDOM Performed at [facility] [address], [place], [place] [address]    Special Requests   Final    NONE Performed at [facility], [place] [address], [place], [place] [address]    Culture (A)  Final    >=100,000 COLONIES/mL ESCHERICHIA COLI CULTURE REINCUBATED FOR BETTER GROWTH Performed at [facility], [place] [address], [place], [place] [address]    Report Status PENDING  Incomplete   Organism ID, Bacteria ESCHERICHIA COLI (A)  Final      Susceptibility   Escherichia coli - MIC*    AMPICILLIN >=32 RESISTANT Resistant     CEFAZOLIN 8 SENSITIVE Sensitive     CEFEPIME <=0.12 SENSITIVE Sensitive     CEFTRIAXONE <=0.25 SENSITIVE Sensitive     CIPROFLOXACIN >=4 RESISTANT Resistant     GENTAMICIN <=1 SENSITIVE Sensitive     IMIPENEM <=0.25 SENSITIVE Sensitive     NITROFURANTOIN <=16 SENSITIVE Sensitive     TRIMETH/SULFA >=320 RESISTANT Resistant     AMPICILLIN/SULBACTAM >=32 RESISTANT Resistant     PIP/TAZO <=4 SENSITIVE Sensitive     *  >=100,000 COLONIES/mL ESCHERICHIA COLI  SARS CORONAVIRUS 2 (TAT 6-24 HRS) Nasopharyngeal Nasopharyngeal Swab  Status: Abnormal   Collection Time: 05/22/20  7:08 PM   Specimen: Nasopharyngeal Swab  Result Value Ref Range Status   SARS Coronavirus 2 POSITIVE (A) NEGATIVE Final    Comment: (NOTE) SARS-CoV-2 target nucleic acids are DETECTED.  The SARS-CoV-2 RNA is generally detectable in upper and lower respiratory specimens during the acute phase of infection. Positive results are indicative of the presence of SARS-CoV-2 RNA. Clinical correlation with patient history and other diagnostic information is  necessary to determine patient infection status. Positive results do not rule out bacterial infection or co-infection with other viruses.  The expected result is Negative.  Fact Sheet for Patients: SugarRoll.be  Fact Sheet for Healthcare Providers: https://www.woods-mathews.com/  This test is not yet approved or cleared by the [place] FDA and  has been authorized for detection and/or diagnosis of SARS-CoV-2 by FDA under an Emergency Use Authorization (EUA). This EUA will remain  in effect (meaning this test can be used) for the duration of the COVID-19 declaration under Section 564(b)(1) of the Act, 21 U. S.C. section 360bbb-3(b)(1), unless the authorization is terminated or revoked sooner.   Performed at [facility], [place] [address] [place], [place] [address]     Radiology Studies: No results found.    Brittne Kawasaki T. [place]  If 7PM-7AM, please contact night-coverage [email or web address] 05/25/2020, 12:21 PM

## 2020-05-25 NOTE — Progress Notes (Signed)
Physical Therapy Treatment Patient Details Name: [name] MRN: [id number] DOB: 1916/06/27 Today's Date: 05/25/2020    History of Present Illness 87 yo female admitted with weakness, UTI, COVID(+). Hx of vertigo, recurrent UTI, depression, sciatica, pulm HTN, CAD, CKD, CHF, shingles, DJD, orthostatic hypotension, falls, SDH    PT Comments    Patient asking for warm blanket. Patient appears to be weaker than previous encounter. Minimally interacting , somewhat listless. Assisted to [facility] and back into bed. Patient  Stated that she wanted to  just get back into bed and get warm.. Per RN, patient ate very little at breakfast. Continue PT for progressive mobility.    Follow Up Recommendations  SNF-needs 24/7 caregivers.     Equipment Recommendations  None recommended by PT    Recommendations for Other Services       Precautions / Restrictions Precautions Precautions: Fall Restrictions Weight Bearing Restrictions: No    Mobility  Bed Mobility Overal bed mobility: Needs Assistance Bed Mobility: Sit to Supine     Supine to sit: Min assist;HOB elevated Sit to supine: Min assist   General bed mobility comments: assistance with legs onto bed.    Transfers Overall transfer level: Needs assistance  Transfers: Sit to/from Stand;Stand Pivot Transfers Sit to Stand: Min assist hand hold assist to stand and pivot. Stand pivot transfers: Min assist       General transfer comment: min assist to stand from recliner and BSC, min assistance to pivot to  [facility] and bed. Patient does push up from surfaces.  Ambulation/Gait                 Stairs             Wheelchair Mobility    Modified Rankin (Stroke Patients Only)       Balance Overall balance assessment: Needs assistance Sitting-balance support: Feet supported Sitting balance-Leahy Scale: Fair     Standing balance support: Single extremity supported Standing balance-Leahy Scale: Poor Standing  balance comment: requires some support                            Cognition Arousal/Alertness: Awake/alert Behavior During Therapy: Flat affect Overall Cognitive Status: Within Functional Limits for tasks assessed                                 General Comments: appears to be down, not feeling well,, frowing  alot and very little "energy"      Exercises      General Comments        Pertinent Vitals/Pain Pain Assessment: No/denies pain    Home Living Family/patient expects to be discharged to:: Unsure     Type of Home: Assisted living Home Access: Level entry   Home Layout: One level Home Equipment: Walker - 4 wheels;Shower seat;Grab bars - tub/shower;Walker - 2 wheels;Hospital bed      Prior Function Level of Independence: Needs assistance  Gait / Transfers Assistance Needed: uses rollator for ambulation.  Had fall and SDH in July 2021. Pt denies any subsequent falls. ADL's / Homemaking Assistance Needed: Pt reports staff assists with LE dressing as needed. Pt reports that she bathes and toilets herself and manages her own medication. Dining hall is significant distance so pt does have meals brought to room at times.  Uses transportation offered through [facility] and is active with their in-house PT  and OT.     PT Goals (current goals can now be found in the care plan section) Acute Rehab PT Goals Patient Stated Goal: to return to ILF Progress towards PT goals: Progressing toward goals    Frequency    Min 2X/week      PT Plan Current plan remains appropriate;Frequency needs to be updated    Co-evaluation              AM-PAC PT "6 Clicks" Mobility   Outcome Measure  Help needed turning from your back to your side while in a flat bed without using bedrails?: A Little Help needed moving from lying on your back to sitting on the side of a flat bed without using bedrails?: A Little Help needed moving to and from a bed to a  chair (including a wheelchair)?: A Lot Help needed standing up from a chair using your arms (e.g., wheelchair or bedside chair)?: A Lot Help needed to walk in hospital room?: A Lot Help needed climbing 3-5 steps with a railing? : Total 6 Click Score: 13    End of Session Equipment Utilized During Treatment: Gait belt Activity Tolerance: Patient limited by fatigue Patient left: in bed;with call bell/phone within reach;with bed alarm set Nurse Communication: Mobility status PT Visit Diagnosis: Pain;History of falling (Z91.81);Repeated falls (R29.6);Difficulty in walking, not elsewhere classified (R26.2);Muscle weakness (generalized) (M62.81)     Time: 9242-6834 PT Time Calculation (min) (ACUTE ONLY): 20 min  Charges:  $Therapeutic Activity: 8-22 mins                     Tresa Endo PT Acute Rehabilitation Services Pager [phone] Office [phone]    Claretha Cooper 05/25/2020, 1:13 PM

## 2020-05-25 NOTE — Evaluation (Signed)
Occupational Therapy Evaluation Patient Details Name: [name] MRN: [id number] DOB: 1916-10-03 Today's Date: 05/25/2020    History of Present Illness 87 yo female admitted with weakness, UTI, COVID(+). Hx of vertigo, recurrent UTI, depression, sciatica, pulm HTN, CAD, CKD, CHF, shingles, DJD, orthostatic hypotension, falls, SDH   Clinical Impression   [name] is a 87 year old woman who reports she is typically able to ambulate and perform ADLs without assistance at ILF. Today patient needing min assist for supine to sit, min-mod assist or standing and min guard for short ambulation in room,  assistance to don socks, perform toileting and perform LB bathing all which is below her normal level of independence. Patient exhibits generalized weakness, decreased activity tolerance and impaired balance. Patient HOH and her responses slow at times. Patient will benefit from skilled OT services while in hospital to improve deficits and learn compensatory strategies as needed in order to return to PLOF. Recommend SNF due to decline in functional abilities. If patient refuses SNF - she needs 24/7 assistance at least initially. She reports having a niece that helps her.     Follow Up Recommendations  SNF;Supervision/Assistance - 24 hour    Equipment Recommendations  None recommended by OT    Recommendations for Other Services       Precautions / Restrictions Precautions Precautions: Fall Restrictions Weight Bearing Restrictions: No      Mobility Bed Mobility Overal bed mobility: Needs Assistance Bed Mobility: Supine to Sit     Supine to sit: Min assist;HOB elevated     General bed mobility comments: Increased time. Min assist or trunk negotiation    Transfers Overall transfer level: Needs assistance Equipment used: Rolling walker (2 wheeled) Transfers: Sit to/from Stand Sit to Stand: Min assist;Mod assist         General transfer comment: Min assist to  stand from elevated bed height and mod assist to stand from [facility]. MIn guard to take steps to [facility] and then recliner.    Balance Overall balance assessment: Needs assistance Sitting-balance support: Feet supported Sitting balance-Leahy Scale: Fair     Standing balance support: Bilateral upper extremity supported Standing balance-Leahy Scale: Poor                             ADL either performed or assessed with clinical judgement   ADL Overall ADL's : Needs assistance/impaired Eating/Feeding: Set up;Sitting   Grooming: Set up;Sitting   Upper Body Bathing: Set up;Sitting   Lower Body Bathing: Minimal assistance;Sit to/from stand   Upper Body Dressing : Set up;Sitting   Lower Body Dressing: Moderate assistance;Sit to/from stand Lower Body Dressing Details (indicate cue type and reason): needs assistance to don socks Toilet Transfer: Minimal assistance;Stand-pivot;BSC;RW   Toileting- Clothing Manipulation and Hygiene: Minimal assistance;Sit to/from stand;Sitting/lateral lean               Vision Patient Visual Report: No change from baseline       Perception     Praxis      Pertinent Vitals/Pain Pain Assessment: No/denies pain     Hand Dominance Right   Extremity/Trunk Assessment Upper Extremity Assessment Upper Extremity Assessment: Generalized weakness   Lower Extremity Assessment Lower Extremity Assessment: Generalized weakness   Cervical / Trunk Assessment Cervical / Trunk Assessment: Kyphotic   Communication Communication Communication: HOH   Cognition Arousal/Alertness: Awake/alert Behavior During Therapy: WFL for tasks assessed/performed Overall Cognitive Status: Within Functional Limits for tasks  assessed                                 General Comments: Overall functional cognition but responses somewhat slow.   General Comments       Exercises     Shoulder Instructions      Home Living Family/patient expects  to be discharged to:: Unsure     Type of Home: Assisted living Home Access: Level entry     Home Layout: One level               Home Equipment: Walker - 4 wheels;Shower seat;Grab bars - tub/shower;Walker - 2 wheels;Hospital bed          Prior Functioning/Environment Level of Independence: Needs assistance  Gait / Transfers Assistance Needed: uses rollator for ambulation.  Had fall and SDH in July 2021. Pt denies any subsequent falls. ADL's / Homemaking Assistance Needed: Pt reports staff assists with LE dressing as needed. Pt reports that she bathes and toilets herself and manages her own medication. Dining hall is significant distance so pt does have meals brought to room at times.  Uses transportation offered through [facility] and is active with their in-house PT and OT.            OT Problem List: Decreased strength;Decreased activity tolerance;Impaired balance (sitting and/or standing)      OT Treatment/Interventions: Self-care/ADL training;Therapeutic exercise;DME and/or AE instruction;Therapeutic activities;Balance training;Patient/family education    OT Goals(Current goals can be found in the care plan section) Acute Rehab OT Goals Patient Stated Goal: to return to ILF OT Goal Formulation: With patient Time For Goal Achievement: 06/08/20 Potential to Achieve Goals: Good  OT Frequency: Min 2X/week   Barriers to D/C:            Co-evaluation              AM-PAC OT "6 Clicks" Daily Activity     Outcome Measure Help from another person eating meals?: A Little Help from another person taking care of personal grooming?: A Little Help from another person toileting, which includes using toliet, bedpan, or urinal?: A Little Help from another person bathing (including washing, rinsing, drying)?: A Little Help from another person to put on and taking off regular upper body clothing?: A Little Help from another person to put on and taking off regular  lower body clothing?: A Lot 6 Click Score: 17   End of Session Equipment Utilized During Treatment: Rolling walker Nurse Communication: Mobility status  Activity Tolerance: Patient limited by fatigue Patient left: in chair;with call bell/phone within reach;with chair alarm set;with nursing/sitter in room  OT Visit Diagnosis: Unsteadiness on feet (R26.81);Muscle weakness (generalized) (M62.81)                Time: 7989-2119 OT Time Calculation (min): 24 min Charges:  OT General Charges $OT Visit: 1 Visit OT Evaluation $OT Eval Low Complexity: 1 Low OT Treatments $Self Care/Home Management : 8-22 mins  Purvi Ruehl, OTR/L [place] [phone] Pager: [place] 05/25/2020, 10:43 AM

## 2020-05-25 NOTE — [place] FL2 (Signed)
[place] MEDICAID FL2 LEVEL OF CARE SCREENING TOOL     IDENTIFICATION  Patient Name: [name] Birthdate: April 25, 1916 Sex: female Admission Date (Current Location): 05/22/2020  [facility] and [place] Number:  Herbalist and Address:  [facility],  [place] [address], [place]      Provider Number: [id number]  Attending Physician Name and Address:  Mercy Riding, MD  Relative Name and Phone Number:  Duwaine Maxin (Relative)   [phone]    Current Level of Care: Hospital Recommended Level of Care: [place] Prior Approval Number:    Date Approved/Denied:   PASRR Number: [id number]  Discharge Plan: SNF    Current Diagnoses: Patient Active Problem List   Diagnosis Date Noted  . UTI (urinary tract infection) 05/22/2020  . Rectal bleeding 03/11/2020  . Hematochezia 03/11/2020  . Orthostatic hypotension 08/13/2019  . Falls, sequela 08/13/2019  . DNR (do not resuscitate) 08/11/2019  . SDH (subdural hematoma) ([place]) 08/09/2019  . Acute lower UTI 01/02/2018  . Acute cystitis without hematuria   . Acute encephalopathy 01/01/2018  . DJD (degenerative joint disease) 08/09/2016  . GI bleed 08/08/2016  . Abnormal echocardiogram 06/14/2016  . PVC's (premature ventricular contractions)   . Bacteremia 09/18/2014  . Fever   . Febrile illness 09/12/2014  . Diverticulitis 09/12/2014  . CKD (chronic kidney disease) stage 3, GFR 30-59 ml/min (HCC) 09/12/2014  . HTN (hypertension) 09/12/2014  . Thrombocytopenia ([place]) 09/12/2014  . Abnormal stress test 01/13/2014  . Rectal bleed 08/10/2013  . Acute renal failure ([place]) 08/10/2013  . Essential hypertension, benign 01/08/2013  . DOE (dyspnea on exertion) 01/08/2013    Orientation RESPIRATION BLADDER Height & Weight     Self,Place (some fluctuation)  Normal External catheter Weight: 68 kg Height:  5\' 11"  (180.3 cm)  BEHAVIORAL SYMPTOMS/MOOD NEUROLOGICAL BOWEL NUTRITION STATUS    (none)  (none) Incontinent Diet (see d/c summary)  AMBULATORY STATUS COMMUNICATION OF NEEDS Skin   Extensive Assist Verbally Normal                       Personal Care Assistance Level of Assistance  Bathing,Feeding,Dressing Bathing Assistance: Maximum assistance Feeding assistance: Limited assistance Dressing Assistance: Limited assistance     Functional Limitations Info  Sight,Hearing,Speech Sight Info: Adequate Hearing Info: Impaired Speech Info: Adequate    SPECIAL CARE FACTORS FREQUENCY  PT (By licensed PT),OT (By licensed OT)     PT Frequency: 5X/W OT Frequency: 5X/W            Contractures Contractures Info: Not present    Additional Factors Info  Code Status,Allergies Code Status Info: DNR Allergies Info: Ace Inhibitors, Penicillins, Tramadol, Strptomycin, Vesicare           Current Medications (05/25/2020):  This is the current hospital active medication list Current Facility-Administered Medications  Medication Dose Route Frequency Provider Last Rate Last Admin  . acetaminophen (TYLENOL) tablet 650 mg  650 mg Oral Q6H PRN Opyd, Ilene Qua, MD   650 mg at 05/25/20 0301  . amLODipine (NORVASC) tablet 2.5 mg  2.5 mg Oral Daily Georgette Shell, MD   2.5 mg at 05/25/20 1052  . ascorbic acid (VITAMIN C) tablet 500 mg  500 mg Oral Daily Georgette Shell, MD   500 mg at 05/25/20 1051  . cefTRIAXone (ROCEPHIN) 1 g in sodium chloride 0.9 % 100 mL IVPB  1 g Intravenous Q24H Gala Romney L, MD 200 mL/hr at 05/24/20 1714  1 g at 05/24/20 1714  . enoxaparin (LOVENOX) injection 40 mg  40 mg Subcutaneous Q24H Gala Romney L, MD   40 mg at 05/24/20 2147  . feeding supplement (ENSURE ENLIVE / ENSURE PLUS) liquid 237 mL  237 mL Oral BID BM Georgette Shell, MD   237 mL at 05/25/20 1101  . hydrocortisone (ANUSOL-HC) suppository 25 mg  25 mg Rectal Daily PRN Georgette Shell, MD      . isosorbide mononitrate (IMDUR) 24 hr tablet 30 mg  30 mg Oral  Daily Georgette Shell, MD   30 mg at 05/25/20 1051  . magnesium sulfate IVPB 2 g 50 mL  2 g Intravenous Once Wendee Beavers T, MD 50 mL/hr at 05/25/20 1303 2 g at 05/25/20 1303  . metoprolol succinate (TOPROL-XL) 24 hr tablet 12.5 mg  12.5 mg Oral Daily Georgette Shell, MD   12.5 mg at 05/25/20 1051  . multivitamin with minerals tablet 1 tablet  1 tablet Oral Daily Georgette Shell, MD   1 tablet at 05/25/20 1051  . ondansetron (ZOFRAN) injection 4 mg  4 mg Intravenous Q8H PRN Wendee Beavers T, MD   4 mg at 05/24/20 1429  . PARoxetine (PAXIL) tablet 10 mg  10 mg Oral Daily Georgette Shell, MD   10 mg at 05/25/20 1051  . polyethylene glycol (MIRALAX / GLYCOLAX) packet 17 g  17 g Oral BID PRN Gonfa, Taye T, MD      . senna-docusate (Senokot-S) tablet 1 tablet  1 tablet Oral BID PRN Wendee Beavers T, MD      . zinc sulfate capsule 220 mg  220 mg Oral Daily Georgette Shell, MD   220 mg at 05/25/20 1051     Discharge Medications: Please see discharge summary for a list of discharge medications.  Relevant Imaging Results:  Relevant Lab Results:   Additional Information SSN [place], [place]

## 2020-05-26 DIAGNOSIS — N3001 Acute cystitis with hematuria: Secondary | ICD-10-CM | POA: Diagnosis not present

## 2020-05-26 DIAGNOSIS — R531 Weakness: Secondary | ICD-10-CM | POA: Diagnosis not present

## 2020-05-26 DIAGNOSIS — N1831 Chronic kidney disease, stage 3a: Secondary | ICD-10-CM | POA: Diagnosis not present

## 2020-05-26 DIAGNOSIS — N3000 Acute cystitis without hematuria: Secondary | ICD-10-CM | POA: Diagnosis not present

## 2020-05-26 LAB — URINE CULTURE: Culture: >=100000 — AB

## 2020-05-26 NOTE — TOC Progression Note (Signed)
Transition of Care [facility]) - Progression Note    Patient Details  Name: [name] MRN: [id number] Date of Birth: 1916/03/13  Transition of Care [facility]) CM/SW [place], [place] Phone Number: 05/26/2020, 10:31 AM  Clinical Narrative:   Confirmed bed choice with Ms Berneta Sages.  Spoke with Star at [facility] who states they will have an isolated room bed for patient on Monday.  MD informed. TOC will continue to follow during the course of hospitalization.     Expected Discharge Plan: Skilled Nursing Facility Barriers to Discharge: SNF Pending bed offer  Expected Discharge Plan and Services Expected Discharge Plan: [place]   Discharge Planning Services: CM Consult Post Acute Care Choice: [place] Living arrangements for the past 2 months: Assisted Living Facility                                       Social Determinants of Health (SDOH) Interventions    Readmission Risk Interventions Readmission Risk Prevention Plan 03/13/2020  Medication Screening Complete  Transportation Screening Complete  Some recent data might be hidden

## 2020-05-26 NOTE — Plan of Care (Signed)

## 2020-05-26 NOTE — Progress Notes (Signed)
PROGRESS NOTE  [name] [id number] DOB: Mar 14, 1916   PCP: Josetta Huddle, MD  Patient is from: ALF   DOA: 05/22/2020 LOS: 4  Chief complaints: Generalized weakness  Brief Narrative / Interim history: 87 year old F with PMH of recurrent UTI, diastolic CHF, CAD, CKD-3, prior GI bleed, diverticulosis, HTN and HLD presenting with generalized weakness and abdominal pain and admitted for UTI.  She also tested positive for COVID-19 but no significant respiratory symptoms other than DOA.  She is saturating 100% on RA.  She is vaccinated x2 but no booster.  She completed 3 days of remdesivir on 4/28.   Therapy recommended SNF  Subjective: Seen and examined earlier this morning.  No major events overnight of this morning.  Still weak but better.  She is concerned about her eyeglasses.  She is not sure if she lost it at home or on EMS track.  She denies chest pain, dyspnea, GI or UTI symptoms.  Objective: Vitals:   05/25/20 1217 05/25/20 2121 05/26/20 0702 05/26/20 0945  BP: 129/62 123/63 131/71   Pulse: (!) 55 (!) 57 (!) 55 60  Resp: (!) 21 (!) 22 20   Temp: 98 F (36.7 C) 97.6 F (36.4 C) (!) 97.4 F (36.3 C)   TempSrc: Oral Oral Oral   SpO2: 97% 96% 96%   Weight:      Height:        Intake/Output Summary (Last 24 hours) at 05/26/2020 1252 Last data filed at 05/26/2020 1004 Gross per 24 hour  Intake 793.98 ml  Output 750 ml  Net 43.98 ml   Filed Weights   05/22/20 1407  Weight: 68 kg    Examination:  GENERAL: Frail looking elderly female.  No apparent distress. HEENT: MMM.  Vision and hearing grossly intact.  NECK: Supple.  No apparent JVD.  RESP: On RA.  No IWOB.  Fair aeration bilaterally. CVS:  RRR. Heart sounds normal.  ABD/GI/GU: BS+. Abd soft, NTND.  MSK/EXT:  Moves extremities. No apparent deformity. No edema.  SKIN: no apparent skin lesion or wound NEURO: Awake, alert and oriented appropriately.  No apparent focal neuro deficit. PSYCH: Calm. Normal  affect.   Procedures:  None  Microbiology summarized: 4/25-COVID-19 PCR positive 4/25-urine culture was E. coli  Assessment & Plan: E. coli UTI-history of recurrent UTIs.  Presented with abdominal pain and weakness.  Urine culture with E. coli resistant to ampicillin, Cipro, Bactrim and Unasyn. -IV ceftriaxone 4/25-4/29  COVID-19 infection-has generalized weakness and dyspnea on exertion but no desaturation. -Completed 3 days of IV remdesivir 4/26-4/28 -Supportive care  Chronic combined CHF: TTE in 2018 with LVEF of 45 to [id number], diastolic dysfunction and PAPP of 42.  Appears euvolemic.  Does not seem to be on diuretics at home. -Continue holding Lasix -Monitor fluid and respiratory status -Continue home Imdur, metoprolol   Essential hypertension: Normotensive for most part -Continue home amlodipine, Imdur and Toprol  CKD-3A: Stable -Continue monitoring  Hypokalemia/hypomagnesemia: Improved. -Recheck in the morning  Lactic acidosis: Resolved. -Discontinue IV fluid  Normocytic anemia: Drop in Hgb likely dilutional. Recent Labs    03/11/20 1415 03/11/20 2037 03/12/20 0627 03/13/20 0708 03/14/20 0551 03/15/20 0551 05/22/20 1451 05/23/20 0430 05/24/20 0548 05/25/20 0359  HGB 16.6* 10.9* 11.6* 12.0 11.2* 11.0* 13.1 12.0 11.5* 10.9*  -Continue monitoring  Thrombocytopenia: Likely due to gram-negative infection.  Improving. Recent Labs  Lab 05/22/20 1451 05/23/20 0430 05/24/20 0548 05/25/20 0359  PLT 125* 120* 99* 118*  -Continue monitoring -May have to hold  Lovenox if it continues to trend down  Leukopenia: Improving. -Continue monitoring  Generalized weakness/physical deconditioning-patient's niece, [facility] POA concerned about patient's weakness and ability to return to ALF.  Therapy recommended SNF. -TOC consulted for SNF  Increase nutrition needs Body mass index is 20.92 kg/m. Nutrition Problem: Increased nutrient needs Etiology: acute  illness Signs/Symptoms: estimated needs Interventions: Ensure Enlive (each supplement provides 350kcal and 20 grams of protein),MVI   DVT prophylaxis:  enoxaparin (LOVENOX) injection 40 mg Start: 05/22/20 2300  Code Status: DNR/DNI Family Communication: Updated patient's niece, [facility] POA over the phone on 4/28. Level of care: Med-Surg Status is: Inpatient  Remains inpatient appropriate because:Unsafe d/c plan   Dispo: The patient is from: ALF              Anticipated d/c is to: SNF.              Patient currently is medically stable to d/c.   Difficult to place patient No       Consultants:  None   Sch Meds:  Scheduled Meds: . amLODipine  2.5 mg Oral Daily  . vitamin C  500 mg Oral Daily  . enoxaparin (LOVENOX) injection  40 mg Subcutaneous Q24H  . feeding supplement  237 mL Oral BID BM  . isosorbide mononitrate  30 mg Oral Daily  . metoprolol succinate  12.5 mg Oral Daily  . multivitamin with minerals  1 tablet Oral Daily  . PARoxetine  10 mg Oral Daily  . zinc sulfate  220 mg Oral Daily   Continuous Infusions: . cefTRIAXone (ROCEPHIN)  IV 1 g (05/25/20 1706)   PRN Meds:.acetaminophen, guaiFENesin-dextromethorphan, hydrocortisone, ondansetron (ZOFRAN) IV, polyethylene glycol, senna-docusate  Antimicrobials: Anti-infectives (From admission, onward)   Start     Dose/Rate Route Frequency Ordered Stop   05/24/20 1000  remdesivir 100 mg in sodium chloride 0.9 % 100 mL IVPB       "Followed by" Linked Group Details   100 mg 200 mL/hr over 30 Minutes Intravenous Daily 05/23/20 1736 05/25/20 1100   05/23/20 1830  remdesivir 200 mg in sodium chloride 0.9% 250 mL IVPB       "Followed by" Linked Group Details   200 mg 580 mL/hr over 30 Minutes Intravenous Once 05/23/20 1736 05/24/20 0458   05/23/20 1800  cefTRIAXone (ROCEPHIN) 1 g in sodium chloride 0.9 % 100 mL IVPB        1 g 200 mL/hr over 30 Minutes Intravenous Every 24 hours 05/22/20 2227     05/22/20 1745   cefTRIAXone (ROCEPHIN) 2 g in sodium chloride 0.9 % 100 mL IVPB        2 g 200 mL/hr over 30 Minutes Intravenous  Once 05/22/20 1733 05/22/20 1822       I have personally reviewed the following labs and images: CBC: Recent Labs  Lab 05/22/20 1451 05/23/20 0430 05/24/20 0548 05/25/20 0359  WBC 3.4* 3.3* 3.1* 3.3*  NEUTROABS 1.3*  --   --   --   HGB 13.1 12.0 11.5* 10.9*  HCT 40.4 39.7 34.8* 34.7*  MCV 94.6 99.7 93.3 97.2  PLT 125* 120* 99* 118*   BMP &GFR Recent Labs  Lab 05/22/20 1730 05/23/20 0430 05/24/20 0548 05/25/20 0359  NA 139 136 137 139  K 3.9 3.0* 4.0 3.7  CL 104 106 108 111  CO2 21* 19* 20* 21*  GLUCOSE 81 92 86 80  BUN 18 17 11 13   CREATININE 0.94 0.84 0.83 0.90  CALCIUM 9.0 8.4*  8.2* 8.5*  MG  --  1.7  --  1.7  PHOS  --   --   --  2.7   Estimated Creatinine Clearance: 34.8 mL/min (by C-G formula based on SCr of 0.9 mg/dL). Liver & Pancreas: Recent Labs  Lab 05/22/20 1730 05/23/20 0430 05/24/20 0548 05/25/20 0359  AST 34 32 34  --   ALT 13 12 9   --   ALKPHOS 75 60 54  --   BILITOT 0.7 0.2* 0.3  --   PROT 8.0 6.8 6.4*  --   ALBUMIN 3.7 3.1* 2.9* 2.8*   Recent Labs  Lab 05/22/20 1730  LIPASE 48   No results for input(s): AMMONIA in the last 168 hours. Diabetic: No results for input(s): HGBA1C in the last 72 hours. No results for input(s): GLUCAP in the last 168 hours. Cardiac Enzymes: No results for input(s): CKTOTAL, CKMB, CKMBINDEX, TROPONINI in the last 168 hours. No results for input(s): PROBNP in the last 8760 hours. Coagulation Profile: No results for input(s): INR, PROTIME in the last 168 hours. Thyroid Function Tests: No results for input(s): TSH, T4TOTAL, FREET4, T3FREE, THYROIDAB in the last 72 hours. Lipid Profile: No results for input(s): CHOL, HDL, LDLCALC, TRIG, CHOLHDL, LDLDIRECT in the last 72 hours. Anemia Panel: No results for input(s): VITAMINB12, FOLATE, FERRITIN, TIBC, IRON, RETICCTPCT in the last 72  hours. Urine analysis:    Component Value Date/Time   COLORURINE YELLOW 05/22/2020 1451   APPEARANCEUR HAZY (A) 05/22/2020 1451   LABSPEC 1.018 05/22/2020 1451   PHURINE 5.0 05/22/2020 1451   GLUCOSEU NEGATIVE 05/22/2020 1451   HGBUR MODERATE (A) 05/22/2020 1451   BILIRUBINUR NEGATIVE 05/22/2020 1451   KETONESUR 20 (A) 05/22/2020 1451   PROTEINUR 100 (A) 05/22/2020 1451   UROBILINOGEN 0.2 09/18/2014 1600   NITRITE POSITIVE (A) 05/22/2020 1451   LEUKOCYTESUR NEGATIVE 05/22/2020 1451   Sepsis Labs: Invalid input(s): PROCALCITONIN, [place]  Microbiology: Recent Results (from the past 240 hour(s))  Urine culture     Status: Abnormal   Collection Time: 05/22/20  2:51 PM   Specimen: Urine, Random  Result Value Ref Range Status   Specimen Description   Final    URINE, RANDOM Performed at [facility] [address] [place], [place] [address]    Special Requests   Final    NONE Performed at [facility], [place] [address] [place],  [address]    Culture >=100,000 COLONIES/mL ESCHERICHIA COLI (A)  Final   Report Status 05/26/2020 FINAL  Final   Organism ID, Bacteria ESCHERICHIA COLI (A)  Final      Susceptibility   Escherichia coli - MIC*    AMPICILLIN >=32 RESISTANT Resistant     CEFAZOLIN 8 SENSITIVE Sensitive     CEFEPIME <=0.12 SENSITIVE Sensitive     CEFTRIAXONE <=0.25 SENSITIVE Sensitive     CIPROFLOXACIN >=4 RESISTANT Resistant     GENTAMICIN <=1 SENSITIVE Sensitive     IMIPENEM <=0.25 SENSITIVE Sensitive     NITROFURANTOIN <=16 SENSITIVE Sensitive     TRIMETH/SULFA >=320 RESISTANT Resistant     AMPICILLIN/SULBACTAM >=32 RESISTANT Resistant     PIP/TAZO <=4 SENSITIVE Sensitive     * >=100,000 COLONIES/mL ESCHERICHIA COLI  SARS CORONAVIRUS 2 (TAT 6-24 HRS) Nasopharyngeal Nasopharyngeal Swab     Status: Abnormal   Collection Time: 05/22/20  7:08 PM   Specimen: Nasopharyngeal Swab  Result Value Ref Range Status   SARS  Coronavirus 2 POSITIVE (A) NEGATIVE Final  Comment: (NOTE) SARS-CoV-2 target nucleic acids are DETECTED.  The SARS-CoV-2 RNA is generally detectable in upper and lower respiratory specimens during the acute phase of infection. Positive results are indicative of the presence of SARS-CoV-2 RNA. Clinical correlation with patient history and other diagnostic information is  necessary to determine patient infection status. Positive results do not rule out bacterial infection or co-infection with other viruses.  The expected result is Negative.  Fact Sheet for Patients: SugarRoll.be  Fact Sheet for Healthcare Providers: https://www.woods-mathews.com/  This test is not yet approved or cleared by the [place] FDA and  has been authorized for detection and/or diagnosis of SARS-CoV-2 by FDA under an Emergency Use Authorization (EUA). This EUA will remain  in effect (meaning this test can be used) for the duration of the COVID-19 declaration under Section 564(b)(1) of the Act, 21 U. S.C. section 360bbb-3(b)(1), unless the authorization is terminated or revoked sooner.   Performed at [facility], [place] [address], [place], [place] [address]     Radiology Studies: No results found.    Keilah Lemire T. [place]  If 7PM-7AM, please contact night-coverage [email or web address] 05/26/2020, 12:52 PM

## 2020-05-27 DIAGNOSIS — N3000 Acute cystitis without hematuria: Secondary | ICD-10-CM | POA: Diagnosis not present

## 2020-05-27 DIAGNOSIS — N3001 Acute cystitis with hematuria: Secondary | ICD-10-CM | POA: Diagnosis not present

## 2020-05-27 DIAGNOSIS — N1831 Chronic kidney disease, stage 3a: Secondary | ICD-10-CM | POA: Diagnosis not present

## 2020-05-27 DIAGNOSIS — R531 Weakness: Secondary | ICD-10-CM | POA: Diagnosis not present

## 2020-05-27 LAB — CBC
HCT: 33.4 % — ABNORMAL LOW (ref 36.0–46.0)
Hemoglobin: 10.7 g/dL — ABNORMAL LOW (ref 12.0–15.0)
MCH: 30.7 pg (ref 26.0–34.0)
MCHC: 32 g/dL (ref 30.0–36.0)
MCV: 95.7 fL (ref 80.0–100.0)
Platelets: 116 10*3/uL — ABNORMAL LOW (ref 150–400)
RBC: 3.49 MIL/uL — ABNORMAL LOW (ref 3.87–5.11)
RDW: 13.9 % (ref 11.5–15.5)
WBC: 4.3 10*3/uL (ref 4.0–10.5)
nRBC: 0.5 % — ABNORMAL HIGH (ref 0.0–0.2)

## 2020-05-27 LAB — RENAL FUNCTION PANEL
Albumin: 2.8 g/dL — ABNORMAL LOW (ref 3.5–5.0)
Anion gap: 7 (ref 5–15)
BUN: 13 mg/dL (ref 8–23)
CO2: 21 mmol/L — ABNORMAL LOW (ref 22–32)
Calcium: 8.3 mg/dL — ABNORMAL LOW (ref 8.9–10.3)
Chloride: 105 mmol/L (ref 98–111)
Creatinine, Ser: 0.81 mg/dL (ref 0.44–1.00)
GFR, Estimated: >60 mL/min (ref >60)
Glucose, Bld: 86 mg/dL (ref 70–99)
Phosphorus: 2.1 mg/dL — ABNORMAL LOW (ref 2.5–4.6)
Potassium: 3.9 mmol/L (ref 3.5–5.1)
Sodium: 133 mmol/L — ABNORMAL LOW (ref 135–145)

## 2020-05-27 LAB — MAGNESIUM: Magnesium: 1.7 mg/dL (ref 1.7–2.4)

## 2020-05-27 NOTE — Progress Notes (Signed)
PROGRESS NOTE  [name] PIR:[id number] DOB: 1916-09-04   PCP: Josetta Huddle, MD  Patient is from: ALF   DOA: 05/22/2020 LOS: 5  Chief complaints: Generalized weakness  Brief Narrative / Interim history: 87 year old F with PMH of recurrent UTI, diastolic CHF, CAD, CKD-3, prior GI bleed, diverticulosis, HTN and HLD presenting with generalized weakness and abdominal pain and admitted for UTI.  She also tested positive for COVID-19 but no significant respiratory symptoms other than DOA.  She is saturating 100% on RA.  She is vaccinated x2 but no booster.  She completed 3 days of remdesivir on 4/28.   Therapy recommended SNF  Subjective: Seen and examined earlier this morning.  No major events overnight of this morning.  She is sleepy but awakes to voice easily.  Looks tired but no apparent distress.  She responds no to chest pain, shortness of breath or belly pain.  Objective: Vitals:   05/26/20 1403 05/26/20 2213 05/27/20 0702 05/27/20 1241  BP: 122/65 127/61 (!) 149/74 115/73  Pulse: 63 60 88 65  Resp: 19 20 18 18   Temp: 97.9 F (36.6 C) 98.3 F (36.8 C) 97.6 F (36.4 C) 97.8 F (36.6 C)  TempSrc: Oral Oral Oral Oral  SpO2: 97% 100% 98% 96%  Weight:      Height:        Intake/Output Summary (Last 24 hours) at 05/27/2020 1424 Last data filed at 05/27/2020 1000 Gross per 24 hour  Intake 480 ml  Output 1050 ml  Net -570 ml   Filed Weights   05/22/20 1407  Weight: 68 kg    Examination:  GENERAL: No apparent distress.  Nontoxic. HEENT: MMM.  Vision and hearing grossly intact.  NECK: Supple.  No apparent JVD.  RESP: 98% on RA.  No IWOB.  Fair aeration bilaterally. CVS:  RRR. Heart sounds normal.  ABD/GI/GU: BS+. Abd soft, NTND.  MSK/EXT:  Moves extremities. No apparent deformity. No edema.  SKIN: no apparent skin lesion or wound NEURO: Sleepy but wakes to voice.  Oriented to self and place.  No apparent focal neuro deficit. PSYCH: Calm. Normal  affect.  Procedures:  None  Microbiology summarized: 4/25-COVID-19 PCR positive 4/25-urine culture was E. coli  Assessment & Plan: E. coli UTI-history of recurrent UTIs.  Presented with abdominal pain and weakness.  Urine culture with E. coli resistant to ampicillin, Cipro, Bactrim and Unasyn. -Completed antibiotic course with IV ceftriaxone 4/25-4/29  COVID-19 infection-has generalized weakness and dyspnea on exertion but no desaturation. -Completed 3 days of IV remdesivir 4/26-4/28 -Supportive care  Chronic combined CHF: TTE in 2018 with LVEF of 45 to 63%, diastolic dysfunction and PAPP of 42.  Appears euvolemic.  Does not seem to be on diuretics at home. -Continue holding Lasix -Monitor fluid and respiratory status -Continue home Imdur, metoprolol   Essential hypertension: Normotensive for most part -Continue home amlodipine, Imdur and Toprol  CKD-3A: Stable -Continue monitoring  Hypokalemia/hypomagnesemia: Resolved.  Lactic acidosis: Resolved. -Discontinue IV fluid  Normocytic anemia: Drop in Hgb likely dilutional.  H&H stable. Recent Labs    03/11/20 2037 03/12/20 0627 03/13/20 0708 03/14/20 0551 03/15/20 0551 05/22/20 1451 05/23/20 0430 05/24/20 0548 05/25/20 0359 05/27/20 0018  HGB 10.9* 11.6* 12.0 11.2* 11.0* 13.1 12.0 11.5* 10.9* 10.7*  -Continue monitoring  Thrombocytopenia: Likely due to gram-negative infection.  Improving. Recent Labs  Lab 05/22/20 1451 05/23/20 0430 05/24/20 0548 05/25/20 0359 05/27/20 0018  PLT 125* 120* 99* 118* 116*  -Continue monitoring -May have to hold Lovenox  if it continues to trend down  Leukopenia: Resolved.  Generalized weakness/physical deconditioning-patient's niece, [facility] POA concerned about patient's weakness and ability to return to ALF.  Therapy recommended SNF. -TOC consulted for SNF  Increase nutrition needs Body mass index is 20.92 kg/m. Nutrition Problem: Increased nutrient needs Etiology: acute  illness Signs/Symptoms: estimated needs Interventions: Ensure Enlive (each supplement provides 350kcal and 20 grams of protein),MVI   DVT prophylaxis:  enoxaparin (LOVENOX) injection 40 mg Start: 05/22/20 2300  Code Status: DNR/DNI Family Communication: Updated patient's niece, [facility] POA over the phone on 4/28. Level of care: Med-Surg Status is: Inpatient  Remains inpatient appropriate because:Unsafe d/c plan   Dispo: The patient is from: ALF              Anticipated d/c is to: SNF.              Patient currently is medically stable to d/c.   Difficult to place patient No       Consultants:  None   Sch Meds:  Scheduled Meds: . amLODipine  2.5 mg Oral Daily  . vitamin C  500 mg Oral Daily  . enoxaparin (LOVENOX) injection  40 mg Subcutaneous Q24H  . feeding supplement  237 mL Oral BID BM  . isosorbide mononitrate  30 mg Oral Daily  . metoprolol succinate  12.5 mg Oral Daily  . multivitamin with minerals  1 tablet Oral Daily  . PARoxetine  10 mg Oral Daily  . zinc sulfate  220 mg Oral Daily   Continuous Infusions:  PRN Meds:.acetaminophen, guaiFENesin-dextromethorphan, hydrocortisone, ondansetron (ZOFRAN) IV, polyethylene glycol, senna-docusate  Antimicrobials: Anti-infectives (From admission, onward)   Start     Dose/Rate Route Frequency Ordered Stop   05/24/20 1000  remdesivir 100 mg in sodium chloride 0.9 % 100 mL IVPB       "Followed by" Linked Group Details   100 mg 200 mL/hr over 30 Minutes Intravenous Daily 05/23/20 1736 05/25/20 1100   05/23/20 1830  remdesivir 200 mg in sodium chloride 0.9% 250 mL IVPB       "Followed by" Linked Group Details   200 mg 580 mL/hr over 30 Minutes Intravenous Once 05/23/20 1736 05/24/20 0458   05/23/20 1800  cefTRIAXone (ROCEPHIN) 1 g in sodium chloride 0.9 % 100 mL IVPB        1 g 200 mL/hr over 30 Minutes Intravenous Every 24 hours 05/22/20 2227 05/26/20 1740   05/22/20 1745  cefTRIAXone (ROCEPHIN) 2 g in sodium chloride  0.9 % 100 mL IVPB        2 g 200 mL/hr over 30 Minutes Intravenous  Once 05/22/20 1733 05/22/20 1822       I have personally reviewed the following labs and images: CBC: Recent Labs  Lab 05/22/20 1451 05/23/20 0430 05/24/20 0548 05/25/20 0359 05/27/20 0018  WBC 3.4* 3.3* 3.1* 3.3* 4.3  NEUTROABS 1.3*  --   --   --   --   HGB 13.1 12.0 11.5* 10.9* 10.7*  HCT 40.4 39.7 34.8* 34.7* 33.4*  MCV 94.6 99.7 93.3 97.2 95.7  PLT 125* 120* 99* 118* 116*   BMP &GFR Recent Labs  Lab 05/22/20 1730 05/23/20 0430 05/24/20 0548 05/25/20 0359 05/27/20 0018  NA 139 136 137 139 133*  K 3.9 3.0* 4.0 3.7 3.9  CL 104 106 108 111 105  CO2 21* 19* 20* 21* 21*  GLUCOSE 81 92 86 80 86  BUN 18 17 11 13 13   CREATININE 0.94 0.84 0.83  0.90 0.81  CALCIUM 9.0 8.4* 8.2* 8.5* 8.3*  MG  --  1.7  --  1.7 1.7  PHOS  --   --   --  2.7 2.1*   Estimated Creatinine Clearance: 38.7 mL/min (by C-G formula based on SCr of 0.81 mg/dL). Liver & Pancreas: Recent Labs  Lab 05/22/20 1730 05/23/20 0430 05/24/20 0548 05/25/20 0359 05/27/20 0018  AST 34 32 34  --   --   ALT 13 12 9   --   --   ALKPHOS 75 60 54  --   --   BILITOT 0.7 0.2* 0.3  --   --   PROT 8.0 6.8 6.4*  --   --   ALBUMIN 3.7 3.1* 2.9* 2.8* 2.8*   Recent Labs  Lab 05/22/20 1730  LIPASE 48   No results for input(s): AMMONIA in the last 168 hours. Diabetic: No results for input(s): HGBA1C in the last 72 hours. No results for input(s): GLUCAP in the last 168 hours. Cardiac Enzymes: No results for input(s): CKTOTAL, CKMB, CKMBINDEX, TROPONINI in the last 168 hours. No results for input(s): PROBNP in the last 8760 hours. Coagulation Profile: No results for input(s): INR, PROTIME in the last 168 hours. Thyroid Function Tests: No results for input(s): TSH, T4TOTAL, FREET4, T3FREE, THYROIDAB in the last 72 hours. Lipid Profile: No results for input(s): CHOL, HDL, LDLCALC, TRIG, CHOLHDL, LDLDIRECT in the last 72 hours. Anemia Panel: No  results for input(s): VITAMINB12, FOLATE, FERRITIN, TIBC, IRON, RETICCTPCT in the last 72 hours. Urine analysis:    Component Value Date/Time   COLORURINE YELLOW 05/22/2020 1451   APPEARANCEUR HAZY (A) 05/22/2020 1451   LABSPEC 1.018 05/22/2020 1451   PHURINE 5.0 05/22/2020 1451   GLUCOSEU NEGATIVE 05/22/2020 1451   HGBUR MODERATE (A) 05/22/2020 1451   BILIRUBINUR NEGATIVE 05/22/2020 1451   KETONESUR 20 (A) 05/22/2020 1451   PROTEINUR 100 (A) 05/22/2020 1451   UROBILINOGEN 0.2 09/18/2014 1600   NITRITE POSITIVE (A) 05/22/2020 1451   LEUKOCYTESUR NEGATIVE 05/22/2020 1451   Sepsis Labs: Invalid input(s): PROCALCITONIN, [place]  Microbiology: Recent Results (from the past 240 hour(s))  Urine culture     Status: Abnormal   Collection Time: 05/22/20  2:51 PM   Specimen: Urine, Random  Result Value Ref Range Status   Specimen Description   Final    URINE, RANDOM Performed at [facility] [address], [place], [place] [address]    Special Requests   Final    NONE Performed at [facility], [place] [address], [place], [place] [address]    Culture >=100,000 COLONIES/mL ESCHERICHIA COLI (A)  Final   Report Status 05/26/2020 FINAL  Final   Organism ID, Bacteria ESCHERICHIA COLI (A)  Final      Susceptibility   Escherichia coli - MIC*    AMPICILLIN >=32 RESISTANT Resistant     CEFAZOLIN 8 SENSITIVE Sensitive     CEFEPIME <=0.12 SENSITIVE Sensitive     CEFTRIAXONE <=0.25 SENSITIVE Sensitive     CIPROFLOXACIN >=4 RESISTANT Resistant     GENTAMICIN <=1 SENSITIVE Sensitive     IMIPENEM <=0.25 SENSITIVE Sensitive     NITROFURANTOIN <=16 SENSITIVE Sensitive     TRIMETH/SULFA >=320 RESISTANT Resistant     AMPICILLIN/SULBACTAM >=32 RESISTANT Resistant     PIP/TAZO <=4 SENSITIVE Sensitive     * >=100,000 COLONIES/mL ESCHERICHIA COLI  SARS CORONAVIRUS 2 (TAT 6-24 HRS) Nasopharyngeal Nasopharyngeal Swab     Status: Abnormal   Collection Time:  05/22/20  7:08 PM   Specimen: Nasopharyngeal Swab  Result Value Ref Range Status   SARS Coronavirus 2 POSITIVE (A) NEGATIVE Final    Comment: (NOTE) SARS-CoV-2 target nucleic acids are DETECTED.  The SARS-CoV-2 RNA is generally detectable in upper and lower respiratory specimens during the acute phase of infection. Positive results are indicative of the presence of SARS-CoV-2 RNA. Clinical correlation with patient history and other diagnostic information is  necessary to determine patient infection status. Positive results do not rule out bacterial infection or co-infection with other viruses.  The expected result is Negative.  Fact Sheet for Patients: SugarRoll.be  Fact Sheet for Healthcare Providers: https://www.woods-mathews.com/  This test is not yet approved or cleared by the [place] FDA and  has been authorized for detection and/or diagnosis of SARS-CoV-2 by FDA under an Emergency Use Authorization (EUA). This EUA will remain  in effect (meaning this test can be used) for the duration of the COVID-19 declaration under Section 564(b)(1) of the Act, 21 U. S.C. section 360bbb-3(b)(1), unless the authorization is terminated or revoked sooner.   Performed at [facility], [place] [address], [place], [place] [address]     Radiology Studies: No results found.    Shondrika Hoque T. [place]  If 7PM-7AM, please contact night-coverage [email or web address] 05/27/2020, 2:24 PM

## 2020-05-28 DIAGNOSIS — R531 Weakness: Secondary | ICD-10-CM | POA: Diagnosis not present

## 2020-05-28 DIAGNOSIS — R638 Other symptoms and signs concerning food and fluid intake: Secondary | ICD-10-CM

## 2020-05-28 DIAGNOSIS — N3000 Acute cystitis without hematuria: Secondary | ICD-10-CM | POA: Diagnosis not present

## 2020-05-28 DIAGNOSIS — N3001 Acute cystitis with hematuria: Secondary | ICD-10-CM | POA: Diagnosis not present

## 2020-05-28 DIAGNOSIS — J029 Acute pharyngitis, unspecified: Secondary | ICD-10-CM

## 2020-05-28 DIAGNOSIS — N1831 Chronic kidney disease, stage 3a: Secondary | ICD-10-CM | POA: Diagnosis not present

## 2020-05-28 LAB — RENAL FUNCTION PANEL
Albumin: 2.9 g/dL — ABNORMAL LOW (ref 3.5–5.0)
Anion gap: 8 (ref 5–15)
BUN: 10 mg/dL (ref 8–23)
CO2: 22 mmol/L (ref 22–32)
Calcium: 8.6 mg/dL — ABNORMAL LOW (ref 8.9–10.3)
Chloride: 109 mmol/L (ref 98–111)
Creatinine, Ser: 0.66 mg/dL (ref 0.44–1.00)
GFR, Estimated: >60 mL/min (ref >60)
Glucose, Bld: 78 mg/dL (ref 70–99)
Phosphorus: 2.4 mg/dL — ABNORMAL LOW (ref 2.5–4.6)
Potassium: 3.4 mmol/L — ABNORMAL LOW (ref 3.5–5.1)
Sodium: 139 mmol/L (ref 135–145)

## 2020-05-28 LAB — CBC
HCT: 34.1 % — ABNORMAL LOW (ref 36.0–46.0)
Hemoglobin: 11.1 g/dL — ABNORMAL LOW (ref 12.0–15.0)
MCH: 30.2 pg (ref 26.0–34.0)
MCHC: 32.6 g/dL (ref 30.0–36.0)
MCV: 92.7 fL (ref 80.0–100.0)
Platelets: 127 10*3/uL — ABNORMAL LOW (ref 150–400)
RBC: 3.68 MIL/uL — ABNORMAL LOW (ref 3.87–5.11)
RDW: 13.8 % (ref 11.5–15.5)
WBC: 3.8 10*3/uL — ABNORMAL LOW (ref 4.0–10.5)
nRBC: 0 % (ref 0.0–0.2)

## 2020-05-28 LAB — MAGNESIUM: Magnesium: 1.8 mg/dL (ref 1.7–2.4)

## 2020-05-28 MED ORDER — PHENOL 1.4 % MT LIQD: 1.0000 | OROMUCOSAL | Status: DC | PRN

## 2020-05-28 MED ORDER — POTASSIUM CHLORIDE CRYS ER 20 MEQ PO TBCR
40.0000 meq | EXTENDED_RELEASE_TABLET | Freq: Once | ORAL | Status: AC
  Administered 2020-05-28: 40 meq via ORAL
  Filled 2020-05-28: qty 2

## 2020-05-28 MED ORDER — POTASSIUM PHOSPHATES 15 MMOLE/5ML IV SOLN
20.0000 mmol | Freq: Once | INTRAVENOUS | Status: AC
  Administered 2020-05-28: 20 mmol via INTRAVENOUS
  Filled 2020-05-28: qty 6.67

## 2020-05-28 NOTE — Progress Notes (Signed)
Nutrition Follow-up  INTERVENTION:   -Continue Ensure Enlive po BID, each supplement provides 350 kcal and 20 grams of protein  -Magic cup TID with meals, each supplement provides 290 kcal and 9 grams of protein  -Multivitamin with minerals daily  NUTRITION DIAGNOSIS:   Increased nutrient needs related to acute illness as evidenced by estimated needs.  Ongoing.  GOAL:   Patient will meet greater than or equal to 90% of their needs  Progressing.  MONITOR:   PO intake,Supplement acceptance,Weight trends,Labs,I & O's  REASON FOR ASSESSMENT:   Consult Poor PO  ASSESSMENT:   87 y.o. female with medical history significant of recurrent UTI, diastolic CHF, coronary artery disease, chronic kidney disease stage III, diverticular disease, hyperlipidemia, hypertension and prior GI bleed who was brought in from skilled facility was complaining of weakness abdominal pain.  No fever or chills.  No nausea vomiting or diarrhea.  Patient is not a good historian but records show that she had slight bleeding apparently in her last bowel movement.  Patient evaluated in the ER.  CT showed mainly sliding hiatal hernia and sigmoid diverticulosis but no diverticulitis.  Urinalysis consistent with acute UTI was positive nitrite bacteria.  Her fecal occult blood test is negative here.  Patient is being admitted with another episode of UTI  Patient has been consuming 30-50% of meals. Breakfast this morning was cereal and a banana. Pt consumed a baked potato with fruit and a few bites of meat for lunch. Pt is accepting Ensure supplements. Will also add Magic cups to meals as well for additional kcals and protein.  Admission weight: 150 lbs. No other weights.  Medications: Vitamin C, Multivitamin with minerals daily, KLOR-CON, Zinc sulfate, K-Phos  Labs reviewed: Low K, Phos  Diet Order:   Diet Order            Diet Heart Room service appropriate? Yes; Fluid consistency: Thin  Diet effective now                  EDUCATION NEEDS:   No education needs have been identified at this time  Skin:  Skin Assessment: Reviewed RN Assessment  Last BM:  4/28 -type 6  Height:   Ht Readings from Last 1 Encounters:  05/22/20 5\' 11"  (1.803 m)    Weight:   Wt Readings from Last 1 Encounters:  05/22/20 68 kg   BMI:  Body mass index is 20.92 kg/m.  Estimated Nutritional Needs:   Kcal:  1500-1700  Protein:  65-75g  Fluid:  1.5L/day  Clayton Bibles, MS, RD, LDN Inpatient Clinical Dietitian Contact information available via Amion

## 2020-05-28 NOTE — Progress Notes (Signed)
Occupational Therapy Treatment Patient Details Name: [name] MRN: [id number] DOB: [birth year] Today's Date: 05/28/2020    History of present illness 87 yo female admitted with weakness, UTI, COVID(+). Hx of vertigo, recurrent UTI, depression, sciatica, pulm HTN, CAD, CKD, CHF, shingles, DJD, orthostatic hypotension, falls, SDH   OT comments  Pt progressing towards acute OT goals. Pt reports feeling poorly today. Focus of session was toilet transfer, pericare, and bed mobility. Pt in bed at end of session eating some lunch. D/c plan remains appropriate.    Follow Up Recommendations  SNF;Supervision/Assistance - 24 hour    Equipment Recommendations  None recommended by OT    Recommendations for Other Services      Precautions / Restrictions Precautions Precautions: Fall Precaution Comments: h/o falls Restrictions Weight Bearing Restrictions: No       Mobility Bed Mobility Overal bed mobility: Needs Assistance Bed Mobility: Sit to Supine       Sit to supine: Min assist   General bed mobility comments: assistance with legs onto bed.    Transfers Overall transfer level: Needs assistance Equipment used: Rolling walker (2 wheeled) Transfers: Sit to/from [facility] Sit to Stand: Min assist Stand pivot transfers: Min assist       General transfer comment: recliner>BSC>EOB. min A to steady. Utilized rw.    Balance Overall balance assessment: Needs assistance Sitting-balance support: Feet supported Sitting balance-Leahy Scale: Fair     Standing balance support: Single extremity supported;Bilateral upper extremity supported Standing balance-Leahy Scale: Poor Standing balance comment: requires some support                           ADL either performed or assessed with clinical judgement   ADL Overall ADL's : Needs assistance/impaired Eating/Feeding: Set up;Sitting Eating/Feeding Details (indicate cue type and reason): needs  full setup of meals (food cut, containers opened, etc) able to feed self.                     Toilet Transfer: Minimal Systems analyst Details (indicate cue type and reason): steadying assist Toileting- Clothing Manipulation and Hygiene: Minimal assistance;Sit to/from stand;Sitting/lateral lean Toileting - Clothing Manipulation Details (indicate cue type and reason): able to complete anterior pericare at min guard level in standing. Min A for posterior pericare in standing       General ADL Comments: Pt completed recliner>BSC>EOB transfers, pericare, and bed mobility. Setup needed for lunch tray, pt able to feed self independently.     Vision       Perception     Praxis      Cognition Arousal/Alertness: Awake/alert Behavior During Therapy: WFL for tasks assessed/performed Overall Cognitive Status: Within Functional Limits for tasks assessed                                          Exercises     Shoulder Instructions       General Comments      Pertinent Vitals/ Pain       Pain Assessment: Faces Faces Pain Scale: Hurts little more Pain Location: unspecified, grimacing during transfers/mobility Pain Descriptors / Indicators: Grimacing Pain Intervention(s): Monitored during session;Limited activity within patient's tolerance;Repositioned  Home Living  Prior Functioning/Environment              Frequency  Min 2X/week        Progress Toward Goals  OT Goals(current goals can now be found in the care plan section)  Progress towards OT goals: Progressing toward goals  Acute Rehab OT Goals Patient Stated Goal: to return to ILF OT Goal Formulation: With patient Time For Goal Achievement: 06/08/20 Potential to Achieve Goals: Good ADL Goals Pt Will Perform Lower Body Dressing: with supervision;sit to/from stand Pt Will Transfer to Toilet: with  supervision;regular height toilet;grab bars;ambulating Pt Will Perform Toileting - Clothing Manipulation and hygiene: with supervision;sit to/from stand Additional ADL Goal #1: Patient will stand at sink to perform grooming task as evidence of improving activity tolerance  Plan Discharge plan remains appropriate    Co-evaluation                 AM-PAC OT "6 Clicks" Daily Activity     Outcome Measure   Help from another person eating meals?: A Little Help from another person taking care of personal grooming?: A Little Help from another person toileting, which includes using toliet, bedpan, or urinal?: A Little Help from another person bathing (including washing, rinsing, drying)?: A Little Help from another person to put on and taking off regular upper body clothing?: A Little Help from another person to put on and taking off regular lower body clothing?: A Lot 6 Click Score: 17    End of Session Equipment Utilized During Treatment: Rolling walker  OT Visit Diagnosis: Unsteadiness on feet (R26.81);Muscle weakness (generalized) (M62.81)   Activity Tolerance Patient limited by fatigue   Patient Left in bed;with call bell/phone within reach;with bed alarm set   Nurse Communication          Time: 2595-6387 OT Time Calculation (min): 20 min  Charges: OT General Charges $OT Visit: 1 Visit OT Treatments $Self Care/Home Management : 8-22 mins  Tyrone Schimke, OT Acute Rehabilitation Services Pager: [phone] Office: [phone]    Hortencia Pilar 05/28/2020, 1:46 PM

## 2020-05-28 NOTE — Progress Notes (Signed)
PROGRESS NOTE  [name] XYV:[id number] DOB: April 29, 1916   PCP: Marden Noble, MD  Patient is from: ALF   DOA: 05/22/2020 LOS: 6  Chief complaints: Generalized weakness  Brief Narrative / Interim history: 87 year old F with PMH of recurrent UTI, diastolic CHF, CAD, CKD-3, prior GI bleed, diverticulosis, HTN and HLD presenting with generalized weakness and abdominal pain and admitted for UTI.  She also tested positive for COVID-19 but no significant respiratory symptoms other than DOA.  She is saturating 100% on RA.  She is vaccinated x2 but no booster.  She completed 3 days of remdesivir on 4/28.  Completed antibiotic course with 5 days of IV ceftriaxone for UTI.  Remains very weak and debilitated.  Therapy recommended SNF  Subjective: Seen and examined earlier this morning.  No major events overnight or this morning.  She complains sore throat and generalized weakness.  She denies chest pain, shortness of breath or cough.  Denies GI or UTI symptoms.  Objective: Vitals:   05/27/20 2045 05/28/20 0438 05/28/20 0940 05/28/20 1020  BP: 117/61 (!) 155/98 136/64   Pulse: (!) 58 69 (!) 56 60  Resp: 16 16 18    Temp: 98.1 F (36.7 C) 98 F (36.7 C) 97.7 F (36.5 C)   TempSrc: Oral Oral Oral   SpO2: 95% 97% 96%   Weight:      Height:        Intake/Output Summary (Last 24 hours) at 05/28/2020 1156 Last data filed at 05/28/2020 1000 Gross per 24 hour  Intake 480 ml  Output 350 ml  Net 130 ml   Filed Weights   05/22/20 1407  Weight: 68 kg    Examination:  GENERAL: Frail looking elderly female. HEENT: MMM.  Dentures in place.  Vision and hearing grossly intact.  NECK: Supple.  No apparent JVD.  RESP: On RA.  No IWOB.  Fair aeration bilaterally. CVS:  RRR. Heart sounds normal.  ABD/GI/GU: BS+. Abd soft, NTND.  MSK/EXT:  Moves extremities. No apparent deformity. No edema.  SKIN: no apparent skin lesion or wound NEURO: Awake, alert and oriented appropriately.  No apparent  focal neuro deficit but generalized weakness. PSYCH: Calm. Normal affect.  Procedures:  None  Microbiology summarized: 4/25-COVID-19 PCR positive 4/25-urine culture was E. coli  Assessment & Plan: E. coli UTI-history of recurrent UTIs.  Presented with abdominal pain and weakness.  Urine culture with E. coli resistant to ampicillin, Cipro, Bactrim and Unasyn. -Completed antibiotic course with IV ceftriaxone 4/25-4/29  COVID-19 infection-has generalized weakness and dyspnea on exertion but no desaturation. -Completed 3 days of IV remdesivir 4/26-4/28 -Supportive care  Chronic combined CHF: TTE in 2018 with LVEF of 45 to 50%, diastolic dysfunction and PAPP of 42.  Appears euvolemic.  Does not seem to be on diuretics at home. -Continue holding Lasix -Monitor fluid and respiratory status -Continue home Imdur, metoprolol   Essential hypertension: Normotensive for most part -Continue home amlodipine, Imdur and Toprol  CKD-3A: Stable -Continue monitoring  Hypokalemia/hypomagnesemia: K3.4. -K-Dur 40 mEq x 1 -IV potassium phosphate 20 mmol x 1  Lactic acidosis: Resolved. -Discontinue IV fluid  Normocytic anemia: Drop in Hgb likely dilutional.  H&H stable. Recent Labs    03/12/20 0627 03/13/20 0708 03/14/20 0551 03/15/20 0551 05/22/20 1451 05/23/20 0430 05/24/20 0548 05/25/20 0359 05/27/20 0018 05/28/20 0703  HGB 11.6* 12.0 11.2* 11.0* 13.1 12.0 11.5* 10.9* 10.7* 11.1*  -Continue monitoring  Thrombocytopenia: Likely due to gram-negative infection.  Improving. Recent Labs  Lab 05/22/20 1451 05/23/20  0430 05/24/20 0548 05/25/20 0359 05/27/20 0018 05/28/20 0703  PLT 125* 120* 99* 118* 116* 127*  -Continue monitoring  Leukopenia: Improved.  Generalized weakness/physical deconditioning-patient's niece, [facility] POA concerned about patient's weakness and ability to return to ALF.  Therapy recommended SNF. -TOC consulted for SNF  Sore throat: Likely from COVID infection.   No apparent lesion on exam. -Chloraseptic spray  Decreased oral intake: Patient has poor p.o. intake. -Appreciate input by dietitian Body mass index is 20.92 kg/m. Nutrition Problem: Increased nutrient needs Etiology: acute illness Signs/Symptoms: estimated needs Interventions: Ensure Enlive (each supplement provides 350kcal and 20 grams of protein),MVI   DVT prophylaxis:  enoxaparin (LOVENOX) injection 40 mg Start: 05/22/20 2300  Code Status: DNR/DNI Family Communication: Updated patient's niece, HC POA over the phone Level of care: Med-Surg Status is: Inpatient  Remains inpatient appropriate because:Unsafe d/c plan   Dispo: The patient is from: ALF              Anticipated d/c is to: SNF.              Patient currently is medically stable to d/c.   Difficult to place patient No       Consultants:  None   Sch Meds:  Scheduled Meds: . amLODipine  2.5 mg Oral Daily  . vitamin C  500 mg Oral Daily  . enoxaparin (LOVENOX) injection  40 mg Subcutaneous Q24H  . feeding supplement  237 mL Oral BID BM  . isosorbide mononitrate  30 mg Oral Daily  . metoprolol succinate  12.5 mg Oral Daily  . multivitamin with minerals  1 tablet Oral Daily  . PARoxetine  10 mg Oral Daily  . potassium chloride  40 mEq Oral Once  . zinc sulfate  220 mg Oral Daily   Continuous Infusions: . potassium PHOSPHATE IVPB (in mmol)     PRN Meds:.acetaminophen, guaiFENesin-dextromethorphan, hydrocortisone, ondansetron (ZOFRAN) IV, phenol, polyethylene glycol, senna-docusate  Antimicrobials: Anti-infectives (From admission, onward)   Start     Dose/Rate Route Frequency Ordered Stop   05/24/20 1000  remdesivir 100 mg in sodium chloride 0.9 % 100 mL IVPB       "Followed by" Linked Group Details   100 mg 200 mL/hr over 30 Minutes Intravenous Daily 05/23/20 1736 05/25/20 1100   05/23/20 1830  remdesivir 200 mg in sodium chloride 0.9% 250 mL IVPB       "Followed by" Linked Group Details   200  mg 580 mL/hr over 30 Minutes Intravenous Once 05/23/20 1736 05/24/20 0458   05/23/20 1800  cefTRIAXone (ROCEPHIN) 1 g in sodium chloride 0.9 % 100 mL IVPB        1 g 200 mL/hr over 30 Minutes Intravenous Every 24 hours 05/22/20 2227 05/26/20 1740   05/22/20 1745  cefTRIAXone (ROCEPHIN) 2 g in sodium chloride 0.9 % 100 mL IVPB        2 g 200 mL/hr over 30 Minutes Intravenous  Once 05/22/20 1733 05/22/20 1822       I have personally reviewed the following labs and images: CBC: Recent Labs  Lab 05/22/20 1451 05/23/20 0430 05/24/20 0548 05/25/20 0359 05/27/20 0018 05/28/20 0703  WBC 3.4* 3.3* 3.1* 3.3* 4.3 3.8*  NEUTROABS 1.3*  --   --   --   --   --   HGB 13.1 12.0 11.5* 10.9* 10.7* 11.1*  HCT 40.4 39.7 34.8* 34.7* 33.4* 34.1*  MCV 94.6 99.7 93.3 97.2 95.7 92.7  PLT 125* 120* 99* 118* 116* 127*  BMP &GFR Recent Labs  Lab 05/23/20 0430 05/24/20 0548 05/25/20 0359 05/27/20 0018 05/28/20 0703  NA 136 137 139 133* 139  K 3.0* 4.0 3.7 3.9 3.4*  CL 106 108 111 105 109  CO2 19* 20* 21* 21* 22  GLUCOSE 92 86 80 86 78  BUN 17 11 13 13 10   CREATININE 0.84 0.83 0.90 0.81 0.66  CALCIUM 8.4* 8.2* 8.5* 8.3* 8.6*  MG 1.7  --  1.7 1.7 1.8  PHOS  --   --  2.7 2.1* 2.4*   Estimated Creatinine Clearance: 39.1 mL/min (by C-G formula based on SCr of 0.66 mg/dL). Liver & Pancreas: Recent Labs  Lab 05/22/20 1730 05/23/20 0430 05/24/20 0548 05/25/20 0359 05/27/20 0018 05/28/20 0703  AST 34 32 34  --   --   --   ALT 13 12 9   --   --   --   ALKPHOS 75 60 54  --   --   --   BILITOT 0.7 0.2* 0.3  --   --   --   PROT 8.0 6.8 6.4*  --   --   --   ALBUMIN 3.7 3.1* 2.9* 2.8* 2.8* 2.9*   Recent Labs  Lab 05/22/20 1730  LIPASE 48   No results for input(s): AMMONIA in the last 168 hours. Diabetic: No results for input(s): HGBA1C in the last 72 hours. No results for input(s): GLUCAP in the last 168 hours. Cardiac Enzymes: No results for input(s): CKTOTAL, CKMB, CKMBINDEX,  TROPONINI in the last 168 hours. No results for input(s): PROBNP in the last 8760 hours. Coagulation Profile: No results for input(s): INR, PROTIME in the last 168 hours. Thyroid Function Tests: No results for input(s): TSH, T4TOTAL, FREET4, T3FREE, THYROIDAB in the last 72 hours. Lipid Profile: No results for input(s): CHOL, HDL, LDLCALC, TRIG, CHOLHDL, LDLDIRECT in the last 72 hours. Anemia Panel: No results for input(s): VITAMINB12, FOLATE, FERRITIN, TIBC, IRON, RETICCTPCT in the last 72 hours. Urine analysis:    Component Value Date/Time   COLORURINE YELLOW 05/22/2020 1451   APPEARANCEUR HAZY (A) 05/22/2020 1451   LABSPEC 1.018 05/22/2020 1451   PHURINE 5.0 05/22/2020 1451   GLUCOSEU NEGATIVE 05/22/2020 1451   HGBUR MODERATE (A) 05/22/2020 1451   BILIRUBINUR NEGATIVE 05/22/2020 1451   KETONESUR 20 (A) 05/22/2020 1451   PROTEINUR 100 (A) 05/22/2020 1451   UROBILINOGEN 0.2 09/18/2014 1600   NITRITE POSITIVE (A) 05/22/2020 1451   LEUKOCYTESUR NEGATIVE 05/22/2020 1451   Sepsis Labs: Invalid input(s): PROCALCITONIN, [place]  Microbiology: Recent Results (from the past 240 hour(s))  Urine culture     Status: Abnormal   Collection Time: 05/22/20  2:51 PM   Specimen: Urine, Random  Result Value Ref Range Status   Specimen Description   Final    URINE, RANDOM Performed at [facility] [address], [place], [place] [address]    Special Requests   Final    NONE Performed at [facility], [place] [address], [place], [place] [address]    Culture >=100,000 COLONIES/mL ESCHERICHIA COLI (A)  Final   Report Status 05/26/2020 FINAL  Final   Organism ID, Bacteria ESCHERICHIA COLI (A)  Final      Susceptibility   Escherichia coli - MIC*    AMPICILLIN >=32 RESISTANT Resistant     CEFAZOLIN 8 SENSITIVE Sensitive     CEFEPIME <=0.12 SENSITIVE Sensitive     CEFTRIAXONE <=0.25 SENSITIVE Sensitive     CIPROFLOXACIN >=4 RESISTANT Resistant  GENTAMICIN <=1 SENSITIVE Sensitive     IMIPENEM <=0.25 SENSITIVE Sensitive     NITROFURANTOIN <=16 SENSITIVE Sensitive     TRIMETH/SULFA >=320 RESISTANT Resistant     AMPICILLIN/SULBACTAM >=32 RESISTANT Resistant     PIP/TAZO <=4 SENSITIVE Sensitive     * >=100,000 COLONIES/mL ESCHERICHIA COLI  SARS CORONAVIRUS 2 (TAT 6-24 HRS) Nasopharyngeal Nasopharyngeal Swab     Status: Abnormal   Collection Time: 05/22/20  7:08 PM   Specimen: Nasopharyngeal Swab  Result Value Ref Range Status   SARS Coronavirus 2 POSITIVE (A) NEGATIVE Final    Comment: (NOTE) SARS-CoV-2 target nucleic acids are DETECTED.  The SARS-CoV-2 RNA is generally detectable in upper and lower respiratory specimens during the acute phase of infection. Positive results are indicative of the presence of SARS-CoV-2 RNA. Clinical correlation with patient history and other diagnostic information is  necessary to determine patient infection status. Positive results do not rule out bacterial infection or co-infection with other viruses.  The expected result is Negative.  Fact Sheet for Patients: SugarRoll.be  Fact Sheet for Healthcare Providers: https://www.woods-mathews.com/  This test is not yet approved or cleared by the [place] FDA and  has been authorized for detection and/or diagnosis of SARS-CoV-2 by FDA under an Emergency Use Authorization (EUA). This EUA will remain  in effect (meaning this test can be used) for the duration of the COVID-19 declaration under Section 564(b)(1) of the Act, 21 U. S.C. section 360bbb-3(b)(1), unless the authorization is terminated or revoked sooner.   Performed at [facility], [place] [address], [place], [place] [address]     Radiology Studies: No results found.    [address] [place] [address]  If 7PM-7AM, please contact night-coverage [email or web address] 05/28/2020, 11:56 AM

## 2020-05-29 DIAGNOSIS — Z8616 Personal history of COVID-19: Secondary | ICD-10-CM | POA: Diagnosis not present

## 2020-05-29 DIAGNOSIS — Z7401 Bed confinement status: Secondary | ICD-10-CM | POA: Diagnosis not present

## 2020-05-29 DIAGNOSIS — M6259 Muscle wasting and atrophy, not elsewhere classified, multiple sites: Secondary | ICD-10-CM | POA: Diagnosis not present

## 2020-05-29 DIAGNOSIS — E782 Mixed hyperlipidemia: Secondary | ICD-10-CM | POA: Diagnosis not present

## 2020-05-29 DIAGNOSIS — D696 Thrombocytopenia, unspecified: Secondary | ICD-10-CM

## 2020-05-29 DIAGNOSIS — R638 Other symptoms and signs concerning food and fluid intake: Secondary | ICD-10-CM | POA: Diagnosis not present

## 2020-05-29 DIAGNOSIS — U099 Post covid-19 condition, unspecified: Secondary | ICD-10-CM | POA: Diagnosis not present

## 2020-05-29 DIAGNOSIS — R5381 Other malaise: Secondary | ICD-10-CM | POA: Diagnosis not present

## 2020-05-29 DIAGNOSIS — K5790 Diverticulosis of intestine, part unspecified, without perforation or abscess without bleeding: Secondary | ICD-10-CM | POA: Diagnosis not present

## 2020-05-29 DIAGNOSIS — D649 Anemia, unspecified: Secondary | ICD-10-CM

## 2020-05-29 DIAGNOSIS — M2550 Pain in unspecified joint: Secondary | ICD-10-CM | POA: Diagnosis not present

## 2020-05-29 DIAGNOSIS — F331 Major depressive disorder, recurrent, moderate: Secondary | ICD-10-CM | POA: Diagnosis not present

## 2020-05-29 DIAGNOSIS — Z1612 Extended spectrum beta lactamase (ESBL) resistance: Secondary | ICD-10-CM | POA: Diagnosis not present

## 2020-05-29 DIAGNOSIS — R2681 Unsteadiness on feet: Secondary | ICD-10-CM | POA: Diagnosis not present

## 2020-05-29 DIAGNOSIS — B9620 Unspecified Escherichia coli [E. coli] as the cause of diseases classified elsewhere: Secondary | ICD-10-CM | POA: Diagnosis not present

## 2020-05-29 DIAGNOSIS — N3001 Acute cystitis with hematuria: Secondary | ICD-10-CM | POA: Diagnosis not present

## 2020-05-29 DIAGNOSIS — R41841 Cognitive communication deficit: Secondary | ICD-10-CM | POA: Diagnosis not present

## 2020-05-29 DIAGNOSIS — N3090 Cystitis, unspecified without hematuria: Secondary | ICD-10-CM | POA: Diagnosis not present

## 2020-05-29 DIAGNOSIS — U071 COVID-19: Secondary | ICD-10-CM

## 2020-05-29 DIAGNOSIS — Z87440 Personal history of urinary (tract) infections: Secondary | ICD-10-CM | POA: Diagnosis not present

## 2020-05-29 DIAGNOSIS — I2510 Atherosclerotic heart disease of native coronary artery without angina pectoris: Secondary | ICD-10-CM | POA: Diagnosis not present

## 2020-05-29 DIAGNOSIS — I5043 Acute on chronic combined systolic (congestive) and diastolic (congestive) heart failure: Secondary | ICD-10-CM

## 2020-05-29 DIAGNOSIS — F4320 Adjustment disorder, unspecified: Secondary | ICD-10-CM | POA: Diagnosis not present

## 2020-05-29 DIAGNOSIS — M159 Polyosteoarthritis, unspecified: Secondary | ICD-10-CM | POA: Diagnosis not present

## 2020-05-29 DIAGNOSIS — K5901 Slow transit constipation: Secondary | ICD-10-CM | POA: Diagnosis not present

## 2020-05-29 DIAGNOSIS — I10 Essential (primary) hypertension: Secondary | ICD-10-CM | POA: Diagnosis not present

## 2020-05-29 DIAGNOSIS — N390 Urinary tract infection, site not specified: Secondary | ICD-10-CM | POA: Diagnosis not present

## 2020-05-29 DIAGNOSIS — I5040 Unspecified combined systolic (congestive) and diastolic (congestive) heart failure: Secondary | ICD-10-CM | POA: Diagnosis not present

## 2020-05-29 DIAGNOSIS — I951 Orthostatic hypotension: Secondary | ICD-10-CM | POA: Diagnosis not present

## 2020-05-29 DIAGNOSIS — R5383 Other fatigue: Secondary | ICD-10-CM | POA: Diagnosis not present

## 2020-05-29 DIAGNOSIS — M6281 Muscle weakness (generalized): Secondary | ICD-10-CM | POA: Diagnosis not present

## 2020-05-29 DIAGNOSIS — I272 Pulmonary hypertension, unspecified: Secondary | ICD-10-CM | POA: Diagnosis not present

## 2020-05-29 DIAGNOSIS — B999 Unspecified infectious disease: Secondary | ICD-10-CM | POA: Diagnosis not present

## 2020-05-29 DIAGNOSIS — N1831 Chronic kidney disease, stage 3a: Secondary | ICD-10-CM | POA: Diagnosis not present

## 2020-05-29 DIAGNOSIS — K219 Gastro-esophageal reflux disease without esophagitis: Secondary | ICD-10-CM | POA: Diagnosis not present

## 2020-05-29 DIAGNOSIS — R2689 Other abnormalities of gait and mobility: Secondary | ICD-10-CM | POA: Diagnosis not present

## 2020-05-29 DIAGNOSIS — I5042 Chronic combined systolic (congestive) and diastolic (congestive) heart failure: Secondary | ICD-10-CM | POA: Diagnosis not present

## 2020-05-29 DIAGNOSIS — F329 Major depressive disorder, single episode, unspecified: Secondary | ICD-10-CM | POA: Diagnosis not present

## 2020-05-29 DIAGNOSIS — R6883 Chills (without fever): Secondary | ICD-10-CM | POA: Diagnosis not present

## 2020-05-29 DIAGNOSIS — Z23 Encounter for immunization: Secondary | ICD-10-CM | POA: Diagnosis not present

## 2020-05-29 DIAGNOSIS — Z029 Encounter for administrative examinations, unspecified: Secondary | ICD-10-CM | POA: Diagnosis not present

## 2020-05-29 DIAGNOSIS — R531 Weakness: Secondary | ICD-10-CM | POA: Diagnosis not present

## 2020-05-29 DIAGNOSIS — R410 Disorientation, unspecified: Secondary | ICD-10-CM | POA: Diagnosis not present

## 2020-05-29 DIAGNOSIS — R1311 Dysphagia, oral phase: Secondary | ICD-10-CM | POA: Diagnosis not present

## 2020-05-29 LAB — RENAL FUNCTION PANEL
Albumin: 2.9 g/dL — ABNORMAL LOW (ref 3.5–5.0)
Anion gap: 10 (ref 5–15)
BUN: 9 mg/dL (ref 8–23)
CO2: 19 mmol/L — ABNORMAL LOW (ref 22–32)
Calcium: 8.8 mg/dL — ABNORMAL LOW (ref 8.9–10.3)
Chloride: 109 mmol/L (ref 98–111)
Creatinine, Ser: 0.73 mg/dL (ref 0.44–1.00)
GFR, Estimated: >60 mL/min (ref >60)
Glucose, Bld: 86 mg/dL (ref 70–99)
Phosphorus: 3.2 mg/dL (ref 2.5–4.6)
Potassium: 4.3 mmol/L (ref 3.5–5.1)
Sodium: 138 mmol/L (ref 135–145)

## 2020-05-29 LAB — CBC
HCT: 35 % — ABNORMAL LOW (ref 36.0–46.0)
Hemoglobin: 11.6 g/dL — ABNORMAL LOW (ref 12.0–15.0)
MCH: 30.6 pg (ref 26.0–34.0)
MCHC: 33.1 g/dL (ref 30.0–36.0)
MCV: 92.3 fL (ref 80.0–100.0)
Platelets: 106 10*3/uL — ABNORMAL LOW (ref 150–400)
RBC: 3.79 MIL/uL — ABNORMAL LOW (ref 3.87–5.11)
RDW: 14.1 % (ref 11.5–15.5)
WBC: 4.8 10*3/uL (ref 4.0–10.5)
nRBC: 0 % (ref 0.0–0.2)

## 2020-05-29 LAB — MAGNESIUM: Magnesium: 1.9 mg/dL (ref 1.7–2.4)

## 2020-05-29 MED ORDER — ENSURE ENLIVE PO LIQD: 237.0000 mL | Freq: Two times a day (BID) | ORAL | 12 refills | Status: DC

## 2020-05-29 MED ORDER — ADULT MULTIVITAMIN W/MINERALS CH: 1.0000 | ORAL_TABLET | Freq: Every day | ORAL | Status: DC

## 2020-05-29 MED ORDER — POTASSIUM CHLORIDE CRYS ER 10 MEQ PO TBCR: 10.0000 meq | EXTENDED_RELEASE_TABLET | ORAL | Status: DC

## 2020-05-29 MED ORDER — SENNOSIDES-DOCUSATE SODIUM 8.6-50 MG PO TABS: 1.0000 | ORAL_TABLET | Freq: Two times a day (BID) | ORAL | Status: DC | PRN

## 2020-05-29 MED ORDER — FUROSEMIDE 20 MG PO TABS: 20.0000 mg | ORAL_TABLET | ORAL | Status: DC

## 2020-05-29 NOTE — Discharge Summary (Signed)
Physician Discharge Summary  [name] MVE:[id number] DOB: 04-05-1916 DOA: 05/22/2020  PCP: Josetta Huddle, MD  Admit date: 05/22/2020 Discharge date: 05/29/2020  Admitted From: ALF Disposition:  SNF  Recommendations for Outpatient Follow-up:  1. Follow ups as below. 2. Please obtain CBC/BMP/Mag at follow up 3. Please follow up on the following pending results: None   Discharge Condition: Stable CODE STATUS: DNR/DNI   Contact information for after-discharge care    Destination    [facility] .   Service: Skilled Nursing Contact information: [place] Hospital Course: 87 year old F with PMH of recurrent UTI, combined CHF, PAH, CAD, CKD-3, prior GI bleed, diverticulosis, HTN and HLD presenting with generalized weakness and abdominal pain and admitted for UTI.  She also tested positive for COVID-19 but no significant respiratory symptoms other than DOA.  She is saturating 100% on RA.  She is vaccinated x2 but no booster.  She completed 3 days of remdesivir on 4/28.  Completed antibiotic course with 5 days of IV ceftriaxone for UTI.  Remains very weak and debilitated.  Therapy recommended SNF.  Patient should be on airborne and contact precaution through 06/01/2020.   See individual problem list below for more on hospital course.  Discharge Diagnoses:  E. coli UTI-history of recurrent UTIs.  Presented with abdominal pain and weakness.  Urine culture with E. coli resistant to ampicillin, Cipro, Bactrim and Unasyn. -Completed antibiotic course with IV ceftriaxone 4/25-4/29  COVID-19 infection-has generalized weakness and dyspnea on exertion but no desaturation. -Completed 3 days of IV remdesivir 4/26-4/28 -Supportive care -Continue airborne and contact precautions through 06/01/2020  Chronic combined CHF/PAH: TTE in 2018 with LVEF of 45 to 28%, diastolic dysfunction and PAPP of  42.  Appears euvolemic.  No respiratory distress -Continue p.o. Lasix 20 mg Monday Wednesday and Friday with p.o. potassium 10 mEq -GDMT-metoprolol XL and Imdur.  Discontinue amlodipine -Liberate diet given poor p.o. intake.  Essential hypertension: Normotensive for most part -Cardiac meds as above  CKD-3A: Stable -Recheck in 1 week  Hypokalemia/hypomagnesemia:  Resolved  Lactic acidosis: Resolved.  Normocytic anemia: Drop in Hgb likely dilutional.  H&H stable. Recent Labs (within last 365 days)              Recent Labs    03/12/20 0627 03/13/20 0708 03/14/20 0551 03/15/20 0551 05/22/20 1451 05/23/20 0430 05/24/20 0548 05/25/20 0359 05/27/20 0018 05/28/20 0703  HGB 11.6* 12.0 11.2* 11.0* 13.1 12.0 11.5* 10.9* 10.7* 11.1*    -Continue monitoring  Thrombocytopenia: Likely due to gram-negative infection.  Improving. Last Labs           Recent Labs  Lab 05/22/20 1451 05/23/20 0430 05/24/20 0548 05/25/20 0359 05/27/20 0018 05/28/20 0703  PLT 125* 120* 99* 118* 116* 127*    -Continue monitoring  Leukopenia: Improved.  Generalized weakness/physical deconditioning-patient's niece, [facility] POA concerned about patient's weakness and ability to return to ALF.  Therapy recommended SNF. -TOC consulted for SNF  Sore throat: Likely from COVID infection.  No apparent lesion on exam.  Improved with Chloraseptic spray. -Chloraseptic spray as needed  Decreased oral intake: Patient has poor p.o. intake. -Appreciate input by dietitian Body mass index is 20.92 kg/m. Nutrition Problem: Increased nutrient needs Etiology: acute illness Signs/Symptoms: estimated needs Interventions: Ensure Enlive (each supplement provides 350kcal and 20 grams of protein),MVI  Discharge Exam: Vitals:   05/28/20 1949 05/29/20 0512  BP: (!) 106/59 134/77  Pulse: 66 67  Resp: 18 16  Temp: 98 F (36.7 C) 97.9 F (36.6 C)  SpO2: 96% 98%    GENERAL: Frail looking elderly  female.  No apparent distress. HEENT: MMM.  Vision and hearing grossly intact.  NECK: Supple.  No apparent JVD.  RESP: On RA.  No IWOB.  Fair aeration bilaterally. CVS:  RRR. Heart sounds normal.  ABD/GI/GU: Bowel sounds present. Soft. Non tender.  MSK/EXT:  Moves extremities. No apparent deformity. No edema.  SKIN: no apparent skin lesion or wound NEURO: Awake, alert and oriented appropriately.  No apparent focal neuro deficit other than generalized weakness. PSYCH: Calm. Normal affect.   Discharge Instructions  Discharge Instructions    Diet general   Complete by: As directed    Increase activity slowly   Complete by: As directed      Allergies as of 05/29/2020      Reactions   Ace Inhibitors Hives, Itching   Streptomycin Other (See Comments), Itching   Pruritus Pruritus   Tramadol Other (See Comments)   unknown   Vesicare [solifenacin Succinate] Itching   Penicillins Rash   Pruritic rash Has patient had a PCN reaction causing immediate rash, facial/tongue/throat swelling, SOB or lightheadedness with hypotension: unknown Has patient had a PCN reaction causing severe rash involving mucus membranes or skin necrosis: unknown Has patient had a PCN reaction that required hospitalization:unknown Has patient had a PCN reaction occurring within the last 10 years: yes If all of the above answers are "NO", then may proceed with Cephalosporin use.      Medication List    STOP taking these medications   amLODipine 2.5 MG tablet Commonly known as: NORVASC   loperamide 2 MG capsule Commonly known as: IMODIUM   meclizine 25 MG tablet Commonly known as: ANTIVERT   pantoprazole 40 MG tablet Commonly known as: PROTONIX   Premarin 0.3 MG tablet Generic drug: estrogens (conjugated)     TAKE these medications   acetaminophen 325 MG tablet Commonly known as: TYLENOL Take 325-650 mg by mouth every 6 (six) hours as needed for mild pain.   Anucort-HC 25 MG suppository Generic  drug: hydrocortisone Place 1 suppository rectally daily as needed for hemorrhoids.   clotrimazole 1 % cream Commonly known as: LOTRIMIN Apply 1 application topically 2 (two) times daily as needed (rash).   feeding supplement Liqd Take 237 mLs by mouth 2 (two) times daily between meals.   furosemide 20 MG tablet Commonly known as: LASIX Take 1 tablet (20 mg total) by mouth every Monday, Wednesday, and Friday. Take 0.5 tablet by mouth Monday, Wednesday, Friday & Saturday What changed:   medication strength  when to take this   isosorbide mononitrate 30 MG 24 hr tablet Commonly known as: IMDUR Take 0.5 tablets (15 mg total) by mouth daily for 14 days. What changed: how much to take   metoprolol succinate 25 MG 24 hr tablet Commonly known as: TOPROL-XL Take 0.5 tablets (12.5 mg total) by mouth daily for 14 days. What changed:   how much to take  when to take this   multivitamin with minerals Tabs tablet Take 1 tablet by mouth daily. Start taking on: May 30, 2020   PARoxetine 10 MG tablet Commonly known as: PAXIL Take 10 mg by mouth daily.   polyethylene glycol 17 g packet Commonly known as: MiraLax Take 17 g by mouth daily as needed for  mild constipation.   potassium chloride 10 MEQ tablet Commonly known as: KLOR-CON Take 1 tablet (10 mEq total) by mouth every Monday, Wednesday, and Friday. What changed:   how much to take  when to take this   senna-docusate 8.6-50 MG tablet Commonly known as: Senokot-S Take 1 tablet by mouth 2 (two) times daily as needed for mild constipation or moderate constipation.   Systane Balance 0.6 % Soln Generic drug: Propylene Glycol Place 1 drop into both eyes daily as needed (dry eyes).       Consultations:  None  Procedures/Studies:   CT Abdomen Pelvis W Contrast  Result Date: 05/22/2020 CLINICAL DATA:  Abdominal pain.  No nausea or vomiting. EXAM: CT ABDOMEN AND PELVIS WITH CONTRAST TECHNIQUE: Multidetector CT  imaging of the abdomen and pelvis was performed using the standard protocol following bolus administration of intravenous contrast. CONTRAST:  75 mL Omnipaque COMPARISON:  CT abdomen pelvis 03/11/2020 FINDINGS: Lower chest: Lung bases are clear. Hepatobiliary: No focal hepatic lesion. Gallbladder normal. No intrahepatic duct dilatation. Mild extrahepatic biliary duct dilatation with the common bile duct measuring 9 mm and favored benign senescent dilatation. Pancreas: Pancreas is normal. No ductal dilatation. No pancreatic inflammation. Spleen: Normal spleen Adrenals/urinary tract: Adrenal glands and kidneys are normal. The ureters and bladder normal. Stomach/Bowel: Large hiatal hernia with approximately half the stomach above the hemidiaphragms. Sliding-type hiatal hernia. Duodenum small-bowel normal. Cecum is normal. Appendix not identified. Scattered diverticula of the descending colon. Multiple diverticula sigmoid colon. No acute inflammation. No bowel obstruction. Vascular/Lymphatic: Abdominal aorta is normal caliber with atherosclerotic calcification. There is no retroperitoneal or periportal lymphadenopathy. No pelvic lymphadenopathy. Reproductive: Post hysterectomy.  Adnexa unremarkable Other: No free fluid. Musculoskeletal: No aggressive osseous lesion. Degenerative osteophytosis of the spine. Chronic compression fracture at L1. IMPRESSION: 1. No acute findings in the abdomen pelvis. 2. Large sliding-type hiatal hernia. 3. Sigmoid diverticulosis without diverticulitis. 4. Extrahepatic biliary dilatation is favored benign senescent duct dilatation. 5. Extensive degenerative changes of the thoracolumbar spine. Electronically Signed   By: Suzy Bouchard M.D.   On: 05/22/2020 19:01   DG Chest Portable 1 View  Result Date: 05/22/2020 CLINICAL DATA:  Shortness of breath EXAM: PORTABLE CHEST 1 VIEW COMPARISON:  08/09/2019 FINDINGS: The heart size and mediastinal contours are within normal limits. Both lungs  are clear. No pleural effusion or pneumothorax. The visualized skeletal structures are unremarkable. IMPRESSION: No acute process in the chest. Electronically Signed   By: Macy Mis M.D.   On: 05/22/2020 15:47       The results of significant diagnostics from this hospitalization (including imaging, microbiology, ancillary and laboratory) are listed below for reference.     Microbiology: Recent Results (from the past 240 hour(s))  Urine culture     Status: Abnormal   Collection Time: 05/22/20  2:51 PM   Specimen: Urine, Random  Result Value Ref Range Status   Specimen Description   Final    URINE, RANDOM Performed at [facility] [address], [place], [place] [address]    Special Requests   Final    NONE Performed at [facility], [place] [address], [place], [place] [address]    Culture >=100,000 COLONIES/mL ESCHERICHIA COLI (A)  Final   Report Status 05/26/2020 FINAL  Final   Organism ID, Bacteria ESCHERICHIA COLI (A)  Final      Susceptibility   Escherichia coli - MIC*    AMPICILLIN >=32 RESISTANT Resistant     CEFAZOLIN 8 SENSITIVE  Sensitive     CEFEPIME <=0.12 SENSITIVE Sensitive     CEFTRIAXONE <=0.25 SENSITIVE Sensitive     CIPROFLOXACIN >=4 RESISTANT Resistant     GENTAMICIN <=1 SENSITIVE Sensitive     IMIPENEM <=0.25 SENSITIVE Sensitive     NITROFURANTOIN <=16 SENSITIVE Sensitive     TRIMETH/SULFA >=320 RESISTANT Resistant     AMPICILLIN/SULBACTAM >=32 RESISTANT Resistant     PIP/TAZO <=4 SENSITIVE Sensitive     * >=100,000 COLONIES/mL ESCHERICHIA COLI  SARS CORONAVIRUS 2 (TAT 6-24 HRS) Nasopharyngeal Nasopharyngeal Swab     Status: Abnormal   Collection Time: 05/22/20  7:08 PM   Specimen: Nasopharyngeal Swab  Result Value Ref Range Status   SARS Coronavirus 2 POSITIVE (A) NEGATIVE Final    Comment: (NOTE) SARS-CoV-2 target nucleic acids are DETECTED.  The SARS-CoV-2 RNA is generally detectable in upper and  lower respiratory specimens during the acute phase of infection. Positive results are indicative of the presence of SARS-CoV-2 RNA. Clinical correlation with patient history and other diagnostic information is  necessary to determine patient infection status. Positive results do not rule out bacterial infection or co-infection with other viruses.  The expected result is Negative.  Fact Sheet for Patients: SugarRoll.be  Fact Sheet for Healthcare Providers: https://www.woods-mathews.com/  This test is not yet approved or cleared by the [place] FDA and  has been authorized for detection and/or diagnosis of SARS-CoV-2 by FDA under an Emergency Use Authorization (EUA). This EUA will remain  in effect (meaning this test can be used) for the duration of the COVID-19 declaration under Section 564(b)(1) of the Act, 21 U. S.C. section 360bbb-3(b)(1), unless the authorization is terminated or revoked sooner.   Performed at [facility], [place] [address], [place], [place] [address]      Labs:  CBC: Recent Labs  Lab 05/22/20 1451 05/23/20 0430 05/24/20 0548 05/25/20 0359 05/27/20 0018 05/28/20 0703 05/29/20 0759  WBC 3.4*   < > 3.1* 3.3* 4.3 3.8* 4.8  NEUTROABS 1.3*  --   --   --   --   --   --   HGB 13.1   < > 11.5* 10.9* 10.7* 11.1* 11.6*  HCT 40.4   < > 34.8* 34.7* 33.4* 34.1* 35.0*  MCV 94.6   < > 93.3 97.2 95.7 92.7 92.3  PLT 125*   < > 99* 118* 116* 127* 106*   < > = values in this interval not displayed.   BMP &GFR Recent Labs  Lab 05/23/20 0430 05/24/20 0548 05/25/20 0359 05/27/20 0018 05/28/20 0703 05/29/20 0759  NA 136 137 139 133* 139 138  K 3.0* 4.0 3.7 3.9 3.4* 4.3  CL 106 108 111 105 109 109  CO2 19* 20* 21* 21* 22 19*  GLUCOSE 92 86 80 86 78 86  BUN 17 11 13 13 10 9   CREATININE 0.84 0.83 0.90 0.81 0.66 0.73  CALCIUM 8.4* 8.2* 8.5* 8.3* 8.6* 8.8*  MG 1.7  --  1.7 1.7 1.8 1.9  PHOS  --   --  2.7 2.1*  2.4* 3.2   Estimated Creatinine Clearance: 39.1 mL/min (by C-G formula based on SCr of 0.73 mg/dL). Liver & Pancreas: Recent Labs  Lab 05/22/20 1730 05/23/20 0430 05/24/20 0548 05/25/20 0359 05/27/20 0018 05/28/20 0703 05/29/20 0759  AST 34 32 34  --   --   --   --   ALT 13 12 9   --   --   --   --  ALKPHOS 75 60 54  --   --   --   --   BILITOT 0.7 0.2* 0.3  --   --   --   --   PROT 8.0 6.8 6.4*  --   --   --   --   ALBUMIN 3.7 3.1* 2.9* 2.8* 2.8* 2.9* 2.9*   Recent Labs  Lab 05/22/20 1730  LIPASE 48   No results for input(s): AMMONIA in the last 168 hours. Diabetic: No results for input(s): HGBA1C in the last 72 hours. No results for input(s): GLUCAP in the last 168 hours. Cardiac Enzymes: No results for input(s): CKTOTAL, CKMB, CKMBINDEX, TROPONINI in the last 168 hours. No results for input(s): PROBNP in the last 8760 hours. Coagulation Profile: No results for input(s): INR, PROTIME in the last 168 hours. Thyroid Function Tests: No results for input(s): TSH, T4TOTAL, FREET4, T3FREE, THYROIDAB in the last 72 hours. Lipid Profile: No results for input(s): CHOL, HDL, LDLCALC, TRIG, CHOLHDL, LDLDIRECT in the last 72 hours. Anemia Panel: No results for input(s): VITAMINB12, FOLATE, FERRITIN, TIBC, IRON, RETICCTPCT in the last 72 hours. Urine analysis:    Component Value Date/Time   COLORURINE YELLOW 05/22/2020 1451   APPEARANCEUR HAZY (A) 05/22/2020 1451   LABSPEC 1.018 05/22/2020 1451   PHURINE 5.0 05/22/2020 1451   GLUCOSEU NEGATIVE 05/22/2020 1451   HGBUR MODERATE (A) 05/22/2020 1451   BILIRUBINUR NEGATIVE 05/22/2020 1451   KETONESUR 20 (A) 05/22/2020 1451   PROTEINUR 100 (A) 05/22/2020 1451   UROBILINOGEN 0.2 09/18/2014 1600   NITRITE POSITIVE (A) 05/22/2020 1451   LEUKOCYTESUR NEGATIVE 05/22/2020 1451   Sepsis Labs: Invalid input(s): PROCALCITONIN, LACTICIDVEN   Time coordinating discharge: 40 minutes  SIGNED:  Mercy Riding, MD  [facility] 05/29/2020, 10:36 AM  If 7PM-7AM, please contact night-coverage [email or web address]

## 2020-05-29 NOTE — Care Management Important Message (Signed)
Important Message  Patient Details IM Letter given to the Patient. Name: [name] MRN: [id number] Date of Birth: August 03, 1916   Medicare Important Message Given:  Yes     Kerin Salen 05/29/2020, 11:06 AM

## 2020-05-29 NOTE — Progress Notes (Addendum)
Pt discharged at this time to [facility]. Prior to DC, IV site was removed. Pt was dressed and belongings were packed including her hearing aids and dentures. Pt stable at time of DC.

## 2020-05-30 ENCOUNTER — Other Ambulatory Visit: Payer: Self-pay | Admitting: *Deleted

## 2020-05-30 DIAGNOSIS — B9620 Unspecified Escherichia coli [E. coli] as the cause of diseases classified elsewhere: Secondary | ICD-10-CM | POA: Diagnosis not present

## 2020-05-30 DIAGNOSIS — N390 Urinary tract infection, site not specified: Secondary | ICD-10-CM | POA: Diagnosis not present

## 2020-05-30 DIAGNOSIS — U071 COVID-19: Secondary | ICD-10-CM | POA: Diagnosis not present

## 2020-05-30 DIAGNOSIS — K5790 Diverticulosis of intestine, part unspecified, without perforation or abscess without bleeding: Secondary | ICD-10-CM | POA: Diagnosis not present

## 2020-05-30 NOTE — Patient Outreach (Signed)
Member screened for potential [facility] Care Management needs. [name] is residing in [facility] per [facility] (Patient [name]).   Communication sent to facility SW to inquire about transition plans.   Will continue to follow for potential [facility] Care Management needs while member resides in SNF.    Marthenia Rolling, MSN, RN,BSN [place] Acute Care Coordinator [phone] [facility]) [phone]  (Toll free office)

## 2020-06-01 DIAGNOSIS — M159 Polyosteoarthritis, unspecified: Secondary | ICD-10-CM | POA: Diagnosis not present

## 2020-06-01 DIAGNOSIS — Z8616 Personal history of COVID-19: Secondary | ICD-10-CM | POA: Diagnosis not present

## 2020-06-01 DIAGNOSIS — R2681 Unsteadiness on feet: Secondary | ICD-10-CM | POA: Diagnosis not present

## 2020-06-01 DIAGNOSIS — I5042 Chronic combined systolic (congestive) and diastolic (congestive) heart failure: Secondary | ICD-10-CM | POA: Diagnosis not present

## 2020-06-01 DIAGNOSIS — I2510 Atherosclerotic heart disease of native coronary artery without angina pectoris: Secondary | ICD-10-CM | POA: Diagnosis not present

## 2020-06-01 DIAGNOSIS — E782 Mixed hyperlipidemia: Secondary | ICD-10-CM | POA: Diagnosis not present

## 2020-06-01 DIAGNOSIS — K219 Gastro-esophageal reflux disease without esophagitis: Secondary | ICD-10-CM | POA: Diagnosis not present

## 2020-06-01 DIAGNOSIS — F329 Major depressive disorder, single episode, unspecified: Secondary | ICD-10-CM | POA: Diagnosis not present

## 2020-06-01 DIAGNOSIS — N390 Urinary tract infection, site not specified: Secondary | ICD-10-CM | POA: Diagnosis not present

## 2020-06-01 DIAGNOSIS — I10 Essential (primary) hypertension: Secondary | ICD-10-CM | POA: Diagnosis not present

## 2020-06-01 DIAGNOSIS — M6281 Muscle weakness (generalized): Secondary | ICD-10-CM | POA: Diagnosis not present

## 2020-06-05 DIAGNOSIS — I2510 Atherosclerotic heart disease of native coronary artery without angina pectoris: Secondary | ICD-10-CM | POA: Diagnosis not present

## 2020-06-05 DIAGNOSIS — I10 Essential (primary) hypertension: Secondary | ICD-10-CM | POA: Diagnosis not present

## 2020-06-05 DIAGNOSIS — F329 Major depressive disorder, single episode, unspecified: Secondary | ICD-10-CM | POA: Diagnosis not present

## 2020-06-05 DIAGNOSIS — I5042 Chronic combined systolic (congestive) and diastolic (congestive) heart failure: Secondary | ICD-10-CM | POA: Diagnosis not present

## 2020-06-05 DIAGNOSIS — E782 Mixed hyperlipidemia: Secondary | ICD-10-CM | POA: Diagnosis not present

## 2020-06-05 DIAGNOSIS — N390 Urinary tract infection, site not specified: Secondary | ICD-10-CM | POA: Diagnosis not present

## 2020-06-05 DIAGNOSIS — Z8616 Personal history of COVID-19: Secondary | ICD-10-CM | POA: Diagnosis not present

## 2020-06-05 DIAGNOSIS — M159 Polyosteoarthritis, unspecified: Secondary | ICD-10-CM | POA: Diagnosis not present

## 2020-06-05 DIAGNOSIS — R2681 Unsteadiness on feet: Secondary | ICD-10-CM | POA: Diagnosis not present

## 2020-06-05 DIAGNOSIS — K219 Gastro-esophageal reflux disease without esophagitis: Secondary | ICD-10-CM | POA: Diagnosis not present

## 2020-06-05 DIAGNOSIS — M6281 Muscle weakness (generalized): Secondary | ICD-10-CM | POA: Diagnosis not present

## 2020-06-09 DIAGNOSIS — R5383 Other fatigue: Secondary | ICD-10-CM | POA: Diagnosis not present

## 2020-06-09 DIAGNOSIS — I5040 Unspecified combined systolic (congestive) and diastolic (congestive) heart failure: Secondary | ICD-10-CM | POA: Diagnosis not present

## 2020-06-09 DIAGNOSIS — Z8616 Personal history of COVID-19: Secondary | ICD-10-CM | POA: Diagnosis not present

## 2020-06-09 DIAGNOSIS — N3090 Cystitis, unspecified without hematuria: Secondary | ICD-10-CM | POA: Diagnosis not present

## 2020-06-12 DIAGNOSIS — I10 Essential (primary) hypertension: Secondary | ICD-10-CM | POA: Diagnosis not present

## 2020-06-12 DIAGNOSIS — N390 Urinary tract infection, site not specified: Secondary | ICD-10-CM | POA: Diagnosis not present

## 2020-06-12 DIAGNOSIS — I2510 Atherosclerotic heart disease of native coronary artery without angina pectoris: Secondary | ICD-10-CM | POA: Diagnosis not present

## 2020-06-12 DIAGNOSIS — E782 Mixed hyperlipidemia: Secondary | ICD-10-CM | POA: Diagnosis not present

## 2020-06-12 DIAGNOSIS — F329 Major depressive disorder, single episode, unspecified: Secondary | ICD-10-CM | POA: Diagnosis not present

## 2020-06-12 DIAGNOSIS — K219 Gastro-esophageal reflux disease without esophagitis: Secondary | ICD-10-CM | POA: Diagnosis not present

## 2020-06-12 DIAGNOSIS — Z8616 Personal history of COVID-19: Secondary | ICD-10-CM | POA: Diagnosis not present

## 2020-06-12 DIAGNOSIS — F331 Major depressive disorder, recurrent, moderate: Secondary | ICD-10-CM | POA: Diagnosis not present

## 2020-06-12 DIAGNOSIS — M159 Polyosteoarthritis, unspecified: Secondary | ICD-10-CM | POA: Diagnosis not present

## 2020-06-12 DIAGNOSIS — R2681 Unsteadiness on feet: Secondary | ICD-10-CM | POA: Diagnosis not present

## 2020-06-12 DIAGNOSIS — I5042 Chronic combined systolic (congestive) and diastolic (congestive) heart failure: Secondary | ICD-10-CM | POA: Diagnosis not present

## 2020-06-12 DIAGNOSIS — M6281 Muscle weakness (generalized): Secondary | ICD-10-CM | POA: Diagnosis not present

## 2020-06-14 ENCOUNTER — Other Ambulatory Visit: Payer: Self-pay | Admitting: *Deleted

## 2020-06-14 NOTE — Patient Outreach (Signed)
[place] Coordinator follow up. Member screened for potential [facility] Care Management needs.  Update received from [facility] indicating member's transition plan meeting is scheduled for next week. Lived in [place] prior.   No identifiable [facility] Care Management needs at this time.    Marthenia Rolling, MSN, RN,BSN [place] Acute Care Coordinator [phone] [facility]) [phone]  (Toll free office)

## 2020-06-19 DIAGNOSIS — R2681 Unsteadiness on feet: Secondary | ICD-10-CM | POA: Diagnosis not present

## 2020-06-19 DIAGNOSIS — N390 Urinary tract infection, site not specified: Secondary | ICD-10-CM | POA: Diagnosis not present

## 2020-06-19 DIAGNOSIS — I10 Essential (primary) hypertension: Secondary | ICD-10-CM | POA: Diagnosis not present

## 2020-06-19 DIAGNOSIS — F329 Major depressive disorder, single episode, unspecified: Secondary | ICD-10-CM | POA: Diagnosis not present

## 2020-06-19 DIAGNOSIS — I5042 Chronic combined systolic (congestive) and diastolic (congestive) heart failure: Secondary | ICD-10-CM | POA: Diagnosis not present

## 2020-06-19 DIAGNOSIS — K219 Gastro-esophageal reflux disease without esophagitis: Secondary | ICD-10-CM | POA: Diagnosis not present

## 2020-06-19 DIAGNOSIS — M159 Polyosteoarthritis, unspecified: Secondary | ICD-10-CM | POA: Diagnosis not present

## 2020-06-19 DIAGNOSIS — E782 Mixed hyperlipidemia: Secondary | ICD-10-CM | POA: Diagnosis not present

## 2020-06-19 DIAGNOSIS — M6281 Muscle weakness (generalized): Secondary | ICD-10-CM | POA: Diagnosis not present

## 2020-06-19 DIAGNOSIS — I2510 Atherosclerotic heart disease of native coronary artery without angina pectoris: Secondary | ICD-10-CM | POA: Diagnosis not present

## 2020-06-19 DIAGNOSIS — Z8616 Personal history of COVID-19: Secondary | ICD-10-CM | POA: Diagnosis not present

## 2020-06-20 DIAGNOSIS — I5040 Unspecified combined systolic (congestive) and diastolic (congestive) heart failure: Secondary | ICD-10-CM | POA: Diagnosis not present

## 2020-06-20 DIAGNOSIS — K5901 Slow transit constipation: Secondary | ICD-10-CM | POA: Diagnosis not present

## 2020-06-20 DIAGNOSIS — N390 Urinary tract infection, site not specified: Secondary | ICD-10-CM | POA: Diagnosis not present

## 2020-06-20 DIAGNOSIS — U071 COVID-19: Secondary | ICD-10-CM | POA: Diagnosis not present

## 2020-06-22 DIAGNOSIS — R6883 Chills (without fever): Secondary | ICD-10-CM | POA: Diagnosis not present

## 2020-06-22 DIAGNOSIS — U099 Post covid-19 condition, unspecified: Secondary | ICD-10-CM | POA: Diagnosis not present

## 2020-06-22 DIAGNOSIS — Z87440 Personal history of urinary (tract) infections: Secondary | ICD-10-CM | POA: Diagnosis not present

## 2020-06-26 DIAGNOSIS — N390 Urinary tract infection, site not specified: Secondary | ICD-10-CM | POA: Diagnosis not present

## 2020-06-26 DIAGNOSIS — I5042 Chronic combined systolic (congestive) and diastolic (congestive) heart failure: Secondary | ICD-10-CM | POA: Diagnosis not present

## 2020-06-26 DIAGNOSIS — E782 Mixed hyperlipidemia: Secondary | ICD-10-CM | POA: Diagnosis not present

## 2020-06-26 DIAGNOSIS — M159 Polyosteoarthritis, unspecified: Secondary | ICD-10-CM | POA: Diagnosis not present

## 2020-06-26 DIAGNOSIS — I10 Essential (primary) hypertension: Secondary | ICD-10-CM | POA: Diagnosis not present

## 2020-06-26 DIAGNOSIS — M6281 Muscle weakness (generalized): Secondary | ICD-10-CM | POA: Diagnosis not present

## 2020-06-26 DIAGNOSIS — F329 Major depressive disorder, single episode, unspecified: Secondary | ICD-10-CM | POA: Diagnosis not present

## 2020-06-26 DIAGNOSIS — I2510 Atherosclerotic heart disease of native coronary artery without angina pectoris: Secondary | ICD-10-CM | POA: Diagnosis not present

## 2020-06-26 DIAGNOSIS — Z8616 Personal history of COVID-19: Secondary | ICD-10-CM | POA: Diagnosis not present

## 2020-06-26 DIAGNOSIS — K219 Gastro-esophageal reflux disease without esophagitis: Secondary | ICD-10-CM | POA: Diagnosis not present

## 2020-06-26 DIAGNOSIS — R2681 Unsteadiness on feet: Secondary | ICD-10-CM | POA: Diagnosis not present

## 2020-06-27 DIAGNOSIS — Z029 Encounter for administrative examinations, unspecified: Secondary | ICD-10-CM | POA: Diagnosis not present

## 2020-06-27 DIAGNOSIS — B999 Unspecified infectious disease: Secondary | ICD-10-CM | POA: Diagnosis not present

## 2020-06-27 DIAGNOSIS — Z1612 Extended spectrum beta lactamase (ESBL) resistance: Secondary | ICD-10-CM | POA: Diagnosis not present

## 2020-06-27 DIAGNOSIS — N390 Urinary tract infection, site not specified: Secondary | ICD-10-CM | POA: Diagnosis not present

## 2020-07-10 DIAGNOSIS — F329 Major depressive disorder, single episode, unspecified: Secondary | ICD-10-CM | POA: Diagnosis not present

## 2020-07-10 DIAGNOSIS — I5042 Chronic combined systolic (congestive) and diastolic (congestive) heart failure: Secondary | ICD-10-CM | POA: Diagnosis not present

## 2020-07-10 DIAGNOSIS — R5381 Other malaise: Secondary | ICD-10-CM | POA: Diagnosis not present

## 2020-07-10 DIAGNOSIS — M159 Polyosteoarthritis, unspecified: Secondary | ICD-10-CM | POA: Diagnosis not present

## 2020-07-10 DIAGNOSIS — Z1612 Extended spectrum beta lactamase (ESBL) resistance: Secondary | ICD-10-CM | POA: Diagnosis not present

## 2020-07-10 DIAGNOSIS — M6281 Muscle weakness (generalized): Secondary | ICD-10-CM | POA: Diagnosis not present

## 2020-07-10 DIAGNOSIS — R2681 Unsteadiness on feet: Secondary | ICD-10-CM | POA: Diagnosis not present

## 2020-07-10 DIAGNOSIS — I10 Essential (primary) hypertension: Secondary | ICD-10-CM | POA: Diagnosis not present

## 2020-07-10 DIAGNOSIS — E782 Mixed hyperlipidemia: Secondary | ICD-10-CM | POA: Diagnosis not present

## 2020-07-10 DIAGNOSIS — Z8616 Personal history of COVID-19: Secondary | ICD-10-CM | POA: Diagnosis not present

## 2020-07-10 DIAGNOSIS — K219 Gastro-esophageal reflux disease without esophagitis: Secondary | ICD-10-CM | POA: Diagnosis not present

## 2020-07-10 DIAGNOSIS — I2510 Atherosclerotic heart disease of native coronary artery without angina pectoris: Secondary | ICD-10-CM | POA: Diagnosis not present

## 2020-07-10 DIAGNOSIS — N390 Urinary tract infection, site not specified: Secondary | ICD-10-CM | POA: Diagnosis not present

## 2020-07-14 DIAGNOSIS — I951 Orthostatic hypotension: Secondary | ICD-10-CM | POA: Diagnosis not present

## 2020-07-14 DIAGNOSIS — N390 Urinary tract infection, site not specified: Secondary | ICD-10-CM | POA: Diagnosis not present

## 2020-07-14 DIAGNOSIS — U071 COVID-19: Secondary | ICD-10-CM | POA: Diagnosis not present

## 2020-07-14 DIAGNOSIS — I5040 Unspecified combined systolic (congestive) and diastolic (congestive) heart failure: Secondary | ICD-10-CM | POA: Diagnosis not present

## 2020-07-14 DIAGNOSIS — F4320 Adjustment disorder, unspecified: Secondary | ICD-10-CM | POA: Diagnosis not present

## 2020-07-14 DIAGNOSIS — D649 Anemia, unspecified: Secondary | ICD-10-CM | POA: Diagnosis not present

## 2020-07-17 DIAGNOSIS — R109 Unspecified abdominal pain: Secondary | ICD-10-CM | POA: Diagnosis not present

## 2020-07-17 DIAGNOSIS — M6281 Muscle weakness (generalized): Secondary | ICD-10-CM | POA: Diagnosis not present

## 2020-07-17 DIAGNOSIS — R278 Other lack of coordination: Secondary | ICD-10-CM | POA: Diagnosis not present

## 2020-07-17 DIAGNOSIS — R2689 Other abnormalities of gait and mobility: Secondary | ICD-10-CM | POA: Diagnosis not present

## 2020-07-18 DIAGNOSIS — R109 Unspecified abdominal pain: Secondary | ICD-10-CM | POA: Diagnosis not present

## 2020-07-18 DIAGNOSIS — R278 Other lack of coordination: Secondary | ICD-10-CM | POA: Diagnosis not present

## 2020-07-18 DIAGNOSIS — M6281 Muscle weakness (generalized): Secondary | ICD-10-CM | POA: Diagnosis not present

## 2020-07-18 DIAGNOSIS — R2689 Other abnormalities of gait and mobility: Secondary | ICD-10-CM | POA: Diagnosis not present

## 2020-07-19 DIAGNOSIS — R109 Unspecified abdominal pain: Secondary | ICD-10-CM | POA: Diagnosis not present

## 2020-07-19 DIAGNOSIS — R278 Other lack of coordination: Secondary | ICD-10-CM | POA: Diagnosis not present

## 2020-07-19 DIAGNOSIS — R2689 Other abnormalities of gait and mobility: Secondary | ICD-10-CM | POA: Diagnosis not present

## 2020-07-19 DIAGNOSIS — M6281 Muscle weakness (generalized): Secondary | ICD-10-CM | POA: Diagnosis not present

## 2020-07-20 DIAGNOSIS — I10 Essential (primary) hypertension: Secondary | ICD-10-CM | POA: Diagnosis not present

## 2020-07-20 DIAGNOSIS — M6281 Muscle weakness (generalized): Secondary | ICD-10-CM | POA: Diagnosis not present

## 2020-07-20 DIAGNOSIS — B351 Tinea unguium: Secondary | ICD-10-CM | POA: Diagnosis not present

## 2020-07-20 DIAGNOSIS — Z87440 Personal history of urinary (tract) infections: Secondary | ICD-10-CM | POA: Diagnosis not present

## 2020-07-20 DIAGNOSIS — G609 Hereditary and idiopathic neuropathy, unspecified: Secondary | ICD-10-CM | POA: Diagnosis not present

## 2020-07-20 DIAGNOSIS — R109 Unspecified abdominal pain: Secondary | ICD-10-CM | POA: Diagnosis not present

## 2020-07-20 DIAGNOSIS — F322 Major depressive disorder, single episode, severe without psychotic features: Secondary | ICD-10-CM | POA: Diagnosis not present

## 2020-07-20 DIAGNOSIS — R278 Other lack of coordination: Secondary | ICD-10-CM | POA: Diagnosis not present

## 2020-07-20 DIAGNOSIS — K5900 Constipation, unspecified: Secondary | ICD-10-CM | POA: Diagnosis not present

## 2020-07-20 DIAGNOSIS — R2689 Other abnormalities of gait and mobility: Secondary | ICD-10-CM | POA: Diagnosis not present

## 2020-07-21 DIAGNOSIS — R2689 Other abnormalities of gait and mobility: Secondary | ICD-10-CM | POA: Diagnosis not present

## 2020-07-21 DIAGNOSIS — R278 Other lack of coordination: Secondary | ICD-10-CM | POA: Diagnosis not present

## 2020-07-21 DIAGNOSIS — R109 Unspecified abdominal pain: Secondary | ICD-10-CM | POA: Diagnosis not present

## 2020-07-21 DIAGNOSIS — M6281 Muscle weakness (generalized): Secondary | ICD-10-CM | POA: Diagnosis not present

## 2020-07-24 DIAGNOSIS — M6281 Muscle weakness (generalized): Secondary | ICD-10-CM | POA: Diagnosis not present

## 2020-07-24 DIAGNOSIS — R2689 Other abnormalities of gait and mobility: Secondary | ICD-10-CM | POA: Diagnosis not present

## 2020-07-24 DIAGNOSIS — R109 Unspecified abdominal pain: Secondary | ICD-10-CM | POA: Diagnosis not present

## 2020-07-24 DIAGNOSIS — R278 Other lack of coordination: Secondary | ICD-10-CM | POA: Diagnosis not present

## 2020-07-25 DIAGNOSIS — R109 Unspecified abdominal pain: Secondary | ICD-10-CM | POA: Diagnosis not present

## 2020-07-25 DIAGNOSIS — R2689 Other abnormalities of gait and mobility: Secondary | ICD-10-CM | POA: Diagnosis not present

## 2020-07-25 DIAGNOSIS — R278 Other lack of coordination: Secondary | ICD-10-CM | POA: Diagnosis not present

## 2020-07-25 DIAGNOSIS — M6281 Muscle weakness (generalized): Secondary | ICD-10-CM | POA: Diagnosis not present

## 2020-07-27 DIAGNOSIS — R278 Other lack of coordination: Secondary | ICD-10-CM | POA: Diagnosis not present

## 2020-07-27 DIAGNOSIS — R2689 Other abnormalities of gait and mobility: Secondary | ICD-10-CM | POA: Diagnosis not present

## 2020-07-27 DIAGNOSIS — M6281 Muscle weakness (generalized): Secondary | ICD-10-CM | POA: Diagnosis not present

## 2020-07-27 DIAGNOSIS — R109 Unspecified abdominal pain: Secondary | ICD-10-CM | POA: Diagnosis not present

## 2020-08-01 DIAGNOSIS — R278 Other lack of coordination: Secondary | ICD-10-CM | POA: Diagnosis not present

## 2020-08-01 DIAGNOSIS — M6281 Muscle weakness (generalized): Secondary | ICD-10-CM | POA: Diagnosis not present

## 2020-08-01 DIAGNOSIS — R2689 Other abnormalities of gait and mobility: Secondary | ICD-10-CM | POA: Diagnosis not present

## 2020-08-01 DIAGNOSIS — R109 Unspecified abdominal pain: Secondary | ICD-10-CM | POA: Diagnosis not present

## 2020-08-02 DIAGNOSIS — R278 Other lack of coordination: Secondary | ICD-10-CM | POA: Diagnosis not present

## 2020-08-02 DIAGNOSIS — R109 Unspecified abdominal pain: Secondary | ICD-10-CM | POA: Diagnosis not present

## 2020-08-02 DIAGNOSIS — R2689 Other abnormalities of gait and mobility: Secondary | ICD-10-CM | POA: Diagnosis not present

## 2020-08-02 DIAGNOSIS — M6281 Muscle weakness (generalized): Secondary | ICD-10-CM | POA: Diagnosis not present

## 2020-08-03 DIAGNOSIS — R109 Unspecified abdominal pain: Secondary | ICD-10-CM | POA: Diagnosis not present

## 2020-08-03 DIAGNOSIS — R2689 Other abnormalities of gait and mobility: Secondary | ICD-10-CM | POA: Diagnosis not present

## 2020-08-03 DIAGNOSIS — R278 Other lack of coordination: Secondary | ICD-10-CM | POA: Diagnosis not present

## 2020-08-03 DIAGNOSIS — M6281 Muscle weakness (generalized): Secondary | ICD-10-CM | POA: Diagnosis not present

## 2020-08-04 DIAGNOSIS — R2689 Other abnormalities of gait and mobility: Secondary | ICD-10-CM | POA: Diagnosis not present

## 2020-08-04 DIAGNOSIS — R109 Unspecified abdominal pain: Secondary | ICD-10-CM | POA: Diagnosis not present

## 2020-08-04 DIAGNOSIS — M6281 Muscle weakness (generalized): Secondary | ICD-10-CM | POA: Diagnosis not present

## 2020-08-04 DIAGNOSIS — R278 Other lack of coordination: Secondary | ICD-10-CM | POA: Diagnosis not present

## 2020-08-07 DIAGNOSIS — R278 Other lack of coordination: Secondary | ICD-10-CM | POA: Diagnosis not present

## 2020-08-07 DIAGNOSIS — R109 Unspecified abdominal pain: Secondary | ICD-10-CM | POA: Diagnosis not present

## 2020-08-07 DIAGNOSIS — M6281 Muscle weakness (generalized): Secondary | ICD-10-CM | POA: Diagnosis not present

## 2020-08-07 DIAGNOSIS — R2689 Other abnormalities of gait and mobility: Secondary | ICD-10-CM | POA: Diagnosis not present

## 2020-08-08 DIAGNOSIS — R109 Unspecified abdominal pain: Secondary | ICD-10-CM | POA: Diagnosis not present

## 2020-08-08 DIAGNOSIS — M6281 Muscle weakness (generalized): Secondary | ICD-10-CM | POA: Diagnosis not present

## 2020-08-08 DIAGNOSIS — R278 Other lack of coordination: Secondary | ICD-10-CM | POA: Diagnosis not present

## 2020-08-08 DIAGNOSIS — R2689 Other abnormalities of gait and mobility: Secondary | ICD-10-CM | POA: Diagnosis not present

## 2020-08-09 DIAGNOSIS — R2689 Other abnormalities of gait and mobility: Secondary | ICD-10-CM | POA: Diagnosis not present

## 2020-08-09 DIAGNOSIS — M6281 Muscle weakness (generalized): Secondary | ICD-10-CM | POA: Diagnosis not present

## 2020-08-09 DIAGNOSIS — R278 Other lack of coordination: Secondary | ICD-10-CM | POA: Diagnosis not present

## 2020-08-09 DIAGNOSIS — R109 Unspecified abdominal pain: Secondary | ICD-10-CM | POA: Diagnosis not present

## 2020-08-11 DIAGNOSIS — R278 Other lack of coordination: Secondary | ICD-10-CM | POA: Diagnosis not present

## 2020-08-11 DIAGNOSIS — R2689 Other abnormalities of gait and mobility: Secondary | ICD-10-CM | POA: Diagnosis not present

## 2020-08-11 DIAGNOSIS — M6281 Muscle weakness (generalized): Secondary | ICD-10-CM | POA: Diagnosis not present

## 2020-08-11 DIAGNOSIS — R109 Unspecified abdominal pain: Secondary | ICD-10-CM | POA: Diagnosis not present

## 2020-08-17 DIAGNOSIS — R109 Unspecified abdominal pain: Secondary | ICD-10-CM | POA: Diagnosis not present

## 2020-08-17 DIAGNOSIS — R278 Other lack of coordination: Secondary | ICD-10-CM | POA: Diagnosis not present

## 2020-08-17 DIAGNOSIS — M6281 Muscle weakness (generalized): Secondary | ICD-10-CM | POA: Diagnosis not present

## 2020-08-17 DIAGNOSIS — R2689 Other abnormalities of gait and mobility: Secondary | ICD-10-CM | POA: Diagnosis not present

## 2020-08-18 DIAGNOSIS — R2689 Other abnormalities of gait and mobility: Secondary | ICD-10-CM | POA: Diagnosis not present

## 2020-08-18 DIAGNOSIS — M6281 Muscle weakness (generalized): Secondary | ICD-10-CM | POA: Diagnosis not present

## 2020-08-18 DIAGNOSIS — R109 Unspecified abdominal pain: Secondary | ICD-10-CM | POA: Diagnosis not present

## 2020-08-18 DIAGNOSIS — R278 Other lack of coordination: Secondary | ICD-10-CM | POA: Diagnosis not present

## 2020-08-21 DIAGNOSIS — R2689 Other abnormalities of gait and mobility: Secondary | ICD-10-CM | POA: Diagnosis not present

## 2020-08-21 DIAGNOSIS — M6281 Muscle weakness (generalized): Secondary | ICD-10-CM | POA: Diagnosis not present

## 2020-08-21 DIAGNOSIS — R278 Other lack of coordination: Secondary | ICD-10-CM | POA: Diagnosis not present

## 2020-08-21 DIAGNOSIS — R109 Unspecified abdominal pain: Secondary | ICD-10-CM | POA: Diagnosis not present

## 2020-08-22 DIAGNOSIS — R109 Unspecified abdominal pain: Secondary | ICD-10-CM | POA: Diagnosis not present

## 2020-08-22 DIAGNOSIS — R2689 Other abnormalities of gait and mobility: Secondary | ICD-10-CM | POA: Diagnosis not present

## 2020-08-22 DIAGNOSIS — M6281 Muscle weakness (generalized): Secondary | ICD-10-CM | POA: Diagnosis not present

## 2020-08-22 DIAGNOSIS — R278 Other lack of coordination: Secondary | ICD-10-CM | POA: Diagnosis not present

## 2020-08-24 DIAGNOSIS — R109 Unspecified abdominal pain: Secondary | ICD-10-CM | POA: Diagnosis not present

## 2020-08-24 DIAGNOSIS — M6281 Muscle weakness (generalized): Secondary | ICD-10-CM | POA: Diagnosis not present

## 2020-08-24 DIAGNOSIS — R278 Other lack of coordination: Secondary | ICD-10-CM | POA: Diagnosis not present

## 2020-08-24 DIAGNOSIS — R2689 Other abnormalities of gait and mobility: Secondary | ICD-10-CM | POA: Diagnosis not present

## 2020-09-05 DIAGNOSIS — Z87440 Personal history of urinary (tract) infections: Secondary | ICD-10-CM | POA: Diagnosis not present

## 2020-09-05 DIAGNOSIS — H04123 Dry eye syndrome of bilateral lacrimal glands: Secondary | ICD-10-CM | POA: Diagnosis not present

## 2020-09-05 DIAGNOSIS — R8290 Unspecified abnormal findings in urine: Secondary | ICD-10-CM | POA: Diagnosis not present

## 2020-09-05 DIAGNOSIS — I10 Essential (primary) hypertension: Secondary | ICD-10-CM | POA: Diagnosis not present

## 2020-09-05 DIAGNOSIS — F322 Major depressive disorder, single episode, severe without psychotic features: Secondary | ICD-10-CM | POA: Diagnosis not present

## 2020-09-05 DIAGNOSIS — B351 Tinea unguium: Secondary | ICD-10-CM | POA: Diagnosis not present

## 2020-09-05 DIAGNOSIS — L89302 Pressure ulcer of unspecified buttock, stage 2: Secondary | ICD-10-CM | POA: Diagnosis not present

## 2020-09-05 DIAGNOSIS — M25571 Pain in right ankle and joints of right foot: Secondary | ICD-10-CM | POA: Diagnosis not present

## 2020-09-05 DIAGNOSIS — G609 Hereditary and idiopathic neuropathy, unspecified: Secondary | ICD-10-CM | POA: Diagnosis not present

## 2020-11-09 DIAGNOSIS — F322 Major depressive disorder, single episode, severe without psychotic features: Secondary | ICD-10-CM | POA: Diagnosis not present

## 2020-11-09 DIAGNOSIS — Z23 Encounter for immunization: Secondary | ICD-10-CM | POA: Diagnosis not present

## 2020-11-09 DIAGNOSIS — J3489 Other specified disorders of nose and nasal sinuses: Secondary | ICD-10-CM | POA: Diagnosis not present

## 2020-11-09 DIAGNOSIS — R197 Diarrhea, unspecified: Secondary | ICD-10-CM | POA: Diagnosis not present

## 2020-11-09 DIAGNOSIS — B351 Tinea unguium: Secondary | ICD-10-CM | POA: Diagnosis not present

## 2020-11-09 DIAGNOSIS — I10 Essential (primary) hypertension: Secondary | ICD-10-CM | POA: Diagnosis not present

## 2020-11-09 DIAGNOSIS — G609 Hereditary and idiopathic neuropathy, unspecified: Secondary | ICD-10-CM | POA: Diagnosis not present

## 2020-11-09 DIAGNOSIS — Z87440 Personal history of urinary (tract) infections: Secondary | ICD-10-CM | POA: Diagnosis not present

## 2020-11-09 DIAGNOSIS — M25571 Pain in right ankle and joints of right foot: Secondary | ICD-10-CM | POA: Diagnosis not present

## 2020-11-09 DIAGNOSIS — H04123 Dry eye syndrome of bilateral lacrimal glands: Secondary | ICD-10-CM | POA: Diagnosis not present

## 2020-11-16 DIAGNOSIS — Z23 Encounter for immunization: Secondary | ICD-10-CM | POA: Diagnosis not present

## 2020-11-20 DIAGNOSIS — R399 Unspecified symptoms and signs involving the genitourinary system: Secondary | ICD-10-CM | POA: Diagnosis not present

## 2020-11-20 DIAGNOSIS — K5792 Diverticulitis of intestine, part unspecified, without perforation or abscess without bleeding: Secondary | ICD-10-CM | POA: Diagnosis not present

## 2020-11-20 DIAGNOSIS — R1032 Left lower quadrant pain: Secondary | ICD-10-CM | POA: Diagnosis not present

## 2020-12-15 DIAGNOSIS — K5732 Diverticulitis of large intestine without perforation or abscess without bleeding: Secondary | ICD-10-CM | POA: Diagnosis not present

## 2020-12-15 DIAGNOSIS — R1032 Left lower quadrant pain: Secondary | ICD-10-CM | POA: Diagnosis not present

## 2020-12-25 DIAGNOSIS — K5732 Diverticulitis of large intestine without perforation or abscess without bleeding: Secondary | ICD-10-CM | POA: Diagnosis not present

## 2020-12-26 DIAGNOSIS — S20419A Abrasion of unspecified back wall of thorax, initial encounter: Secondary | ICD-10-CM | POA: Diagnosis not present

## 2020-12-26 DIAGNOSIS — W19XXXA Unspecified fall, initial encounter: Secondary | ICD-10-CM | POA: Diagnosis not present

## 2020-12-27 DIAGNOSIS — I5032 Chronic diastolic (congestive) heart failure: Secondary | ICD-10-CM | POA: Diagnosis not present

## 2020-12-27 DIAGNOSIS — E785 Hyperlipidemia, unspecified: Secondary | ICD-10-CM | POA: Diagnosis not present

## 2020-12-27 DIAGNOSIS — K219 Gastro-esophageal reflux disease without esophagitis: Secondary | ICD-10-CM | POA: Diagnosis not present

## 2020-12-27 DIAGNOSIS — Z7951 Long term (current) use of inhaled steroids: Secondary | ICD-10-CM | POA: Diagnosis not present

## 2020-12-27 DIAGNOSIS — H9190 Unspecified hearing loss, unspecified ear: Secondary | ICD-10-CM | POA: Diagnosis not present

## 2020-12-27 DIAGNOSIS — M5116 Intervertebral disc disorders with radiculopathy, lumbar region: Secondary | ICD-10-CM | POA: Diagnosis not present

## 2020-12-27 DIAGNOSIS — Z9181 History of falling: Secondary | ICD-10-CM | POA: Diagnosis not present

## 2020-12-27 DIAGNOSIS — I130 Hypertensive heart and chronic kidney disease with heart failure and stage 1 through stage 4 chronic kidney disease, or unspecified chronic kidney disease: Secondary | ICD-10-CM | POA: Diagnosis not present

## 2020-12-27 DIAGNOSIS — R350 Frequency of micturition: Secondary | ICD-10-CM | POA: Diagnosis not present

## 2020-12-27 DIAGNOSIS — R634 Abnormal weight loss: Secondary | ICD-10-CM | POA: Diagnosis not present

## 2020-12-27 DIAGNOSIS — M4126 Other idiopathic scoliosis, lumbar region: Secondary | ICD-10-CM | POA: Diagnosis not present

## 2020-12-27 DIAGNOSIS — N183 Chronic kidney disease, stage 3 unspecified: Secondary | ICD-10-CM | POA: Diagnosis not present

## 2020-12-27 DIAGNOSIS — Z6820 Body mass index (BMI) 20.0-20.9, adult: Secondary | ICD-10-CM | POA: Diagnosis not present

## 2020-12-27 DIAGNOSIS — K5904 Chronic idiopathic constipation: Secondary | ICD-10-CM | POA: Diagnosis not present

## 2020-12-27 DIAGNOSIS — F329 Major depressive disorder, single episode, unspecified: Secondary | ICD-10-CM | POA: Diagnosis not present

## 2020-12-27 DIAGNOSIS — M10379 Gout due to renal impairment, unspecified ankle and foot: Secondary | ICD-10-CM | POA: Diagnosis not present

## 2020-12-27 DIAGNOSIS — W19XXXD Unspecified fall, subsequent encounter: Secondary | ICD-10-CM | POA: Diagnosis not present

## 2020-12-27 DIAGNOSIS — K449 Diaphragmatic hernia without obstruction or gangrene: Secondary | ICD-10-CM | POA: Diagnosis not present

## 2020-12-27 DIAGNOSIS — R630 Anorexia: Secondary | ICD-10-CM | POA: Diagnosis not present

## 2020-12-27 DIAGNOSIS — I2510 Atherosclerotic heart disease of native coronary artery without angina pectoris: Secondary | ICD-10-CM | POA: Diagnosis not present

## 2020-12-27 DIAGNOSIS — M150 Primary generalized (osteo)arthritis: Secondary | ICD-10-CM | POA: Diagnosis not present

## 2020-12-27 DIAGNOSIS — K5790 Diverticulosis of intestine, part unspecified, without perforation or abscess without bleeding: Secondary | ICD-10-CM | POA: Diagnosis not present

## 2020-12-27 DIAGNOSIS — R739 Hyperglycemia, unspecified: Secondary | ICD-10-CM | POA: Diagnosis not present

## 2020-12-27 DIAGNOSIS — K5792 Diverticulitis of intestine, part unspecified, without perforation or abscess without bleeding: Secondary | ICD-10-CM | POA: Diagnosis not present

## 2020-12-27 DIAGNOSIS — F419 Anxiety disorder, unspecified: Secondary | ICD-10-CM | POA: Diagnosis not present

## 2021-01-04 DIAGNOSIS — I5032 Chronic diastolic (congestive) heart failure: Secondary | ICD-10-CM | POA: Diagnosis not present

## 2021-01-04 DIAGNOSIS — M5116 Intervertebral disc disorders with radiculopathy, lumbar region: Secondary | ICD-10-CM | POA: Diagnosis not present

## 2021-01-04 DIAGNOSIS — M4126 Other idiopathic scoliosis, lumbar region: Secondary | ICD-10-CM | POA: Diagnosis not present

## 2021-01-04 DIAGNOSIS — N183 Chronic kidney disease, stage 3 unspecified: Secondary | ICD-10-CM | POA: Diagnosis not present

## 2021-01-04 DIAGNOSIS — M150 Primary generalized (osteo)arthritis: Secondary | ICD-10-CM | POA: Diagnosis not present

## 2021-01-04 DIAGNOSIS — I130 Hypertensive heart and chronic kidney disease with heart failure and stage 1 through stage 4 chronic kidney disease, or unspecified chronic kidney disease: Secondary | ICD-10-CM | POA: Diagnosis not present

## 2021-01-11 DIAGNOSIS — I130 Hypertensive heart and chronic kidney disease with heart failure and stage 1 through stage 4 chronic kidney disease, or unspecified chronic kidney disease: Secondary | ICD-10-CM | POA: Diagnosis not present

## 2021-01-11 DIAGNOSIS — M150 Primary generalized (osteo)arthritis: Secondary | ICD-10-CM | POA: Diagnosis not present

## 2021-01-11 DIAGNOSIS — M5116 Intervertebral disc disorders with radiculopathy, lumbar region: Secondary | ICD-10-CM | POA: Diagnosis not present

## 2021-01-11 DIAGNOSIS — N183 Chronic kidney disease, stage 3 unspecified: Secondary | ICD-10-CM | POA: Diagnosis not present

## 2021-01-11 DIAGNOSIS — I5032 Chronic diastolic (congestive) heart failure: Secondary | ICD-10-CM | POA: Diagnosis not present

## 2021-01-11 DIAGNOSIS — M4126 Other idiopathic scoliosis, lumbar region: Secondary | ICD-10-CM | POA: Diagnosis not present

## 2021-01-12 DIAGNOSIS — I130 Hypertensive heart and chronic kidney disease with heart failure and stage 1 through stage 4 chronic kidney disease, or unspecified chronic kidney disease: Secondary | ICD-10-CM | POA: Diagnosis not present

## 2021-01-12 DIAGNOSIS — M150 Primary generalized (osteo)arthritis: Secondary | ICD-10-CM | POA: Diagnosis not present

## 2021-01-12 DIAGNOSIS — M5116 Intervertebral disc disorders with radiculopathy, lumbar region: Secondary | ICD-10-CM | POA: Diagnosis not present

## 2021-01-12 DIAGNOSIS — M4126 Other idiopathic scoliosis, lumbar region: Secondary | ICD-10-CM | POA: Diagnosis not present

## 2021-01-12 DIAGNOSIS — N183 Chronic kidney disease, stage 3 unspecified: Secondary | ICD-10-CM | POA: Diagnosis not present

## 2021-01-12 DIAGNOSIS — I5032 Chronic diastolic (congestive) heart failure: Secondary | ICD-10-CM | POA: Diagnosis not present

## 2021-01-15 DIAGNOSIS — M150 Primary generalized (osteo)arthritis: Secondary | ICD-10-CM | POA: Diagnosis not present

## 2021-01-15 DIAGNOSIS — I130 Hypertensive heart and chronic kidney disease with heart failure and stage 1 through stage 4 chronic kidney disease, or unspecified chronic kidney disease: Secondary | ICD-10-CM | POA: Diagnosis not present

## 2021-01-15 DIAGNOSIS — M4126 Other idiopathic scoliosis, lumbar region: Secondary | ICD-10-CM | POA: Diagnosis not present

## 2021-01-15 DIAGNOSIS — M5116 Intervertebral disc disorders with radiculopathy, lumbar region: Secondary | ICD-10-CM | POA: Diagnosis not present

## 2021-01-15 DIAGNOSIS — I5032 Chronic diastolic (congestive) heart failure: Secondary | ICD-10-CM | POA: Diagnosis not present

## 2021-01-15 DIAGNOSIS — N183 Chronic kidney disease, stage 3 unspecified: Secondary | ICD-10-CM | POA: Diagnosis not present

## 2021-01-17 DIAGNOSIS — I5032 Chronic diastolic (congestive) heart failure: Secondary | ICD-10-CM | POA: Diagnosis not present

## 2021-01-17 DIAGNOSIS — M150 Primary generalized (osteo)arthritis: Secondary | ICD-10-CM | POA: Diagnosis not present

## 2021-01-17 DIAGNOSIS — M5116 Intervertebral disc disorders with radiculopathy, lumbar region: Secondary | ICD-10-CM | POA: Diagnosis not present

## 2021-01-17 DIAGNOSIS — M4126 Other idiopathic scoliosis, lumbar region: Secondary | ICD-10-CM | POA: Diagnosis not present

## 2021-01-17 DIAGNOSIS — I130 Hypertensive heart and chronic kidney disease with heart failure and stage 1 through stage 4 chronic kidney disease, or unspecified chronic kidney disease: Secondary | ICD-10-CM | POA: Diagnosis not present

## 2021-01-17 DIAGNOSIS — N183 Chronic kidney disease, stage 3 unspecified: Secondary | ICD-10-CM | POA: Diagnosis not present

## 2021-01-23 DIAGNOSIS — M150 Primary generalized (osteo)arthritis: Secondary | ICD-10-CM | POA: Diagnosis not present

## 2021-01-23 DIAGNOSIS — M4126 Other idiopathic scoliosis, lumbar region: Secondary | ICD-10-CM | POA: Diagnosis not present

## 2021-01-23 DIAGNOSIS — I130 Hypertensive heart and chronic kidney disease with heart failure and stage 1 through stage 4 chronic kidney disease, or unspecified chronic kidney disease: Secondary | ICD-10-CM | POA: Diagnosis not present

## 2021-01-23 DIAGNOSIS — M5116 Intervertebral disc disorders with radiculopathy, lumbar region: Secondary | ICD-10-CM | POA: Diagnosis not present

## 2021-01-23 DIAGNOSIS — N183 Chronic kidney disease, stage 3 unspecified: Secondary | ICD-10-CM | POA: Diagnosis not present

## 2021-01-23 DIAGNOSIS — I5032 Chronic diastolic (congestive) heart failure: Secondary | ICD-10-CM | POA: Diagnosis not present

## 2021-02-14 DIAGNOSIS — K625 Hemorrhage of anus and rectum: Secondary | ICD-10-CM | POA: Diagnosis not present

## 2021-02-14 DIAGNOSIS — G4700 Insomnia, unspecified: Secondary | ICD-10-CM | POA: Diagnosis not present

## 2021-02-14 DIAGNOSIS — R1030 Lower abdominal pain, unspecified: Secondary | ICD-10-CM | POA: Diagnosis not present

## 2021-03-01 DIAGNOSIS — N390 Urinary tract infection, site not specified: Secondary | ICD-10-CM | POA: Diagnosis not present

## 2021-03-19 DIAGNOSIS — N390 Urinary tract infection, site not specified: Secondary | ICD-10-CM | POA: Diagnosis not present

## 2021-04-12 DIAGNOSIS — K648 Other hemorrhoids: Secondary | ICD-10-CM | POA: Diagnosis not present

## 2021-04-14 ENCOUNTER — Emergency Department (HOSPITAL_COMMUNITY)
Admission: EM | Admit: 2021-04-14 | Discharge: 2021-04-14 | Disposition: A | Discharge status: Home / Self Care | Payer: Medicare Other | Attending: Emergency Medicine | Admitting: Emergency Medicine

## 2021-04-14 ENCOUNTER — Encounter (HOSPITAL_COMMUNITY): Payer: Self-pay

## 2021-04-14 ENCOUNTER — Other Ambulatory Visit: Payer: Self-pay

## 2021-04-14 ENCOUNTER — Emergency Department (HOSPITAL_COMMUNITY): Payer: Medicare Other

## 2021-04-14 DIAGNOSIS — R0602 Shortness of breath: Secondary | ICD-10-CM | POA: Insufficient documentation

## 2021-04-14 DIAGNOSIS — R0600 Dyspnea, unspecified: Secondary | ICD-10-CM | POA: Diagnosis not present

## 2021-04-14 DIAGNOSIS — R001 Bradycardia, unspecified: Secondary | ICD-10-CM | POA: Diagnosis not present

## 2021-04-14 LAB — CBC WITH DIFFERENTIAL/PLATELET
Abs Immature Granulocytes: 0.06 10*3/uL (ref 0.00–0.07)
Basophils Absolute: 0 10*3/uL (ref 0.0–0.1)
Basophils Relative: 0 %
Eosinophils Absolute: 0 10*3/uL (ref 0.0–0.5)
Eosinophils Relative: 0 %
HCT: 36.4 % (ref 36.0–46.0)
Hemoglobin: 11.8 g/dL — ABNORMAL LOW (ref 12.0–15.0)
Immature Granulocytes: 1 %
Lymphocytes Relative: 25 %
Lymphs Abs: 2 10*3/uL (ref 0.7–4.0)
MCH: 29.9 pg (ref 26.0–34.0)
MCHC: 32.4 g/dL (ref 30.0–36.0)
MCV: 92.4 fL (ref 80.0–100.0)
Monocytes Absolute: 1 10*3/uL (ref 0.1–1.0)
Monocytes Relative: 13 %
Neutro Abs: 4.9 10*3/uL (ref 1.7–7.7)
Neutrophils Relative %: 61 %
Platelets: 243 10*3/uL (ref 150–400)
RBC: 3.94 MIL/uL (ref 3.87–5.11)
RDW: 13.5 % (ref 11.5–15.5)
WBC: 7.9 10*3/uL (ref 4.0–10.5)
nRBC: 0 % (ref 0.0–0.2)

## 2021-04-14 LAB — BASIC METABOLIC PANEL
Anion gap: 9 (ref 5–15)
BUN: 8 mg/dL (ref 8–23)
CO2: 24 mmol/L (ref 22–32)
Calcium: 8.6 mg/dL — ABNORMAL LOW (ref 8.9–10.3)
Chloride: 105 mmol/L (ref 98–111)
Creatinine, Ser: 0.71 mg/dL (ref 0.44–1.00)
GFR, Estimated: >60 mL/min (ref >60)
Glucose, Bld: 117 mg/dL — ABNORMAL HIGH (ref 70–99)
Potassium: 3.1 mmol/L — ABNORMAL LOW (ref 3.5–5.1)
Sodium: 138 mmol/L (ref 135–145)

## 2021-04-14 NOTE — Discharge Instructions (Signed)
Return for any problem.  ?

## 2021-04-14 NOTE — ED Provider Notes (Signed)
?[facility] DEPT ?Provider Note ? ? ?CSN: [id number] ?Arrival date & time: 04/14/21  1444 ? ?  ? ?History ? ?No chief complaint on file. ? ? ?[name] is a 88 y.o. female. ? ?88 year old female with prior medical history as detailed below presents for evaluation.  Patient complains of transient feeling of shortness of breath yesterday. ? ?Her sensation of dyspnea has resolved.  She denies current complaint.  She denies chest pain or other associated symptoms.  She denies recent illness or fever. ? ?She also reports that she has been living with her niece.  She is recently decided that she would like to move to a nursing home.  She request assistance with same. ? ?The history is provided by the patient and medical records.  ?Illness ?Location:  Transient dyspnea ?Severity:  Mild ?Onset quality:  Unable to specify ?Timing:  Unable to specify ?Progression:  Resolved ?Chronicity:  New ? ?  ? ?Home Medications ?Prior to Admission medications   ?Medication Sig Start Date End Date Taking? Authorizing Provider  ?acetaminophen (TYLENOL) 325 MG tablet Take 325-650 mg by mouth every 6 (six) hours as needed for mild pain.    [provider]  ?ANUCORT-HC 25 MG suppository Place 1 suppository rectally daily as needed for hemorrhoids. 02/07/20   [provider]  ?clotrimazole (LOTRIMIN) 1 % cream Apply 1 application topically 2 (two) times daily as needed (rash).    [provider]  ?feeding supplement (ENSURE ENLIVE / ENSURE PLUS) LIQD Take 237 mLs by mouth 2 (two) times daily between meals. 05/29/20   Mercy Riding, MD  ?furosemide (LASIX) 20 MG tablet Take 1 tablet (20 mg total) by mouth every Monday, Wednesday, and Friday. Take 0.5 tablet by mouth Monday, Wednesday, Friday & Saturday 05/29/20   Mercy Riding, MD  ?isosorbide mononitrate (IMDUR) 30 MG 24 hr tablet Take 0.5 tablets (15 mg total) by mouth daily for 14 days. ?Patient taking differently: Take 30 mg by  mouth daily. 03/15/20 03/29/20  Kayleen Memos, DO  ?metoprolol succinate (TOPROL-XL) 25 MG 24 hr tablet Take 0.5 tablets (12.5 mg total) by mouth daily for 14 days. ?Patient taking differently: Take 25 mg by mouth in the morning and at bedtime. 03/15/20 03/29/20  Kayleen Memos, DO  ?Multiple Vitamin (MULTIVITAMIN WITH MINERALS) TABS tablet Take 1 tablet by mouth daily. 05/30/20   Mercy Riding, MD  ?PARoxetine (PAXIL) 10 MG tablet Take 10 mg by mouth daily. 03/10/20   [provider]  ?polyethylene glycol (MIRALAX) packet Take 17 g by mouth daily as needed for mild constipation. 08/21/17   Jani Gravel, MD  ?potassium chloride (KLOR-CON) 10 MEQ tablet Take 1 tablet (10 mEq total) by mouth every Monday, Wednesday, and Friday. 05/29/20   Mercy Riding, MD  ?Propylene Glycol (SYSTANE BALANCE) 0.6 % SOLN Place 1 drop into both eyes daily as needed (dry eyes).    [provider]  ?senna-docusate (SENOKOT-S) 8.6-50 MG tablet Take 1 tablet by mouth 2 (two) times daily as needed for mild constipation or moderate constipation. 05/29/20   Mercy Riding, MD  ?   ? ?Allergies    ?Ace inhibitors, Streptomycin, Tramadol, Vesicare [solifenacin succinate], and Penicillins   ? ?Review of Systems   ?Review of Systems  ?All other systems reviewed and are negative. ? ?Physical Exam ?Updated Vital Signs ?BP 119/64   Pulse 77   Temp 97.6 ?F (36.4 ?C) (Oral)   Resp 14  SpO2 98%  ?Physical Exam ?Vitals and nursing note reviewed.  ?Constitutional:   ?   General: She is not in acute distress. ?   Appearance: Normal appearance. She is well-developed.  ?HENT:  ?   Head: Normocephalic and atraumatic.  ?Eyes:  ?   Conjunctiva/sclera: Conjunctivae normal.  ?   Pupils: Pupils are equal, round, and reactive to light.  ?Cardiovascular:  ?   Rate and Rhythm: Normal rate and regular rhythm.  ?   Heart sounds: Normal heart sounds.  ?Pulmonary:  ?   Effort: Pulmonary effort is normal. No respiratory distress.  ?   Breath sounds: Normal breath  sounds.  ?Abdominal:  ?   General: There is no distension.  ?   Palpations: Abdomen is soft.  ?   Tenderness: There is no abdominal tenderness.  ?Musculoskeletal:     ?   General: No deformity. Normal range of motion.  ?   Cervical back: Normal range of motion and neck supple.  ?Skin: ?   General: Skin is warm and dry.  ?Neurological:  ?   General: No focal deficit present.  ?   Mental Status: She is alert and oriented to person, place, and time.  ? ? ?ED Results / Procedures / Treatments   ?Labs ?(all labs ordered are listed, but only abnormal results are displayed) ?Labs Reviewed  ?CBC WITH DIFFERENTIAL/PLATELET - Abnormal; Notable for the following components:  ?    Result Value  ? Hemoglobin 11.8 (*)   ? All other components within normal limits  ?BASIC METABOLIC PANEL - Abnormal; Notable for the following components:  ? Potassium 3.1 (*)   ? Glucose, Bld 117 (*)   ? Calcium 8.6 (*)   ? All other components within normal limits  ? ? ?EKG ?None ? ?Radiology ?DG Chest Port 1 View ? ?Result Date: 04/14/2021 ?CLINICAL DATA:  Shortness of breath EXAM: PORTABLE CHEST 1 VIEW COMPARISON:  05/22/2020 FINDINGS: The heart size and mediastinal contours are within normal limits. Both lungs are clear. The visualized skeletal structures are unremarkable. IMPRESSION: No active disease. Electronically Signed   By: Elmer Picker M.D.   On: 04/14/2021 15:26   ? ?Procedures ?Procedures  ? ? ?Medications Ordered in ED ?Medications - No data to display ? ?ED Course/ Medical Decision Making/ A&P ?  ?                        ?Medical Decision Making ?Amount and/or Complexity of Data Reviewed ?Labs: ordered. ?Radiology: ordered. ? ? ? ?Medical Screen Complete ? ?This patient presented to the ED with complaint of transient dyspnea. ? ?This complaint involves an extensive number of treatment options. The initial differential diagnosis includes, but is not limited to, pneumonia, pneumothorax, metabolic abnormality, etc. ? ?This  presentation is: Acute, Chronic, Self-Limited, Previously Undiagnosed, Uncertain Prognosis, Complicated, Systemic Symptoms, and Threat to Life/Bodily Function ? ?Patient is presenting with complaint of transient dyspnea.  Patient feels improved at time of evaluation. ? ?Evaluation in ED is without evidence of significant abnormality. ? ?Patient's pulse ox is 100% on room air.  Chest x-ray is without significant abnormality. ? ?Other screening labs are without significant abnormality. ? ?Patient and patient's niece are comfortable with discharge.  There is a plan for possible nursing home placement in the next several days.  Niece appears to be comfortable with discharge and understands how to have the patient placed. ? ?Ambulatory referral to social work made. ? ?Importance of  close follow-up is stressed.  Strict return precaution given and understood. ? ?Co morbidities that complicated the patient's evaluation ? ?Advanced age ? ? ?Additional history obtained: ? ?External records from outside sources obtained and reviewed including prior ED visits and prior Inpatient records.  ? ? ?Lab Tests: ? ?I ordered and personally interpreted labs.  The pertinent results include:  CBC BMP ? ? ?Imaging Studies ordered: ? ?I ordered imaging studies including CXR  ?I independently visualized and interpreted obtained imaging which showed NAD ?I agree with the radiologist interpretation. ? ? ?Cardiac Monitoring: ? ?The patient was maintained on a cardiac monitor.  I personally viewed and interpreted the cardiac monitor which showed an underlying rhythm of: NSR ? ? ?Problem List / ED Course: ? ?Transient Dyspnea  ? ? ?Reevaluation: ? ?After the interventions noted above, I reevaluated the patient and found that they have: improved ? ?Disposition: ? ?After consideration of the diagnostic results and the patients response to treatment, I feel that the patent would benefit from close outpatient follow up.  ? ? ? ? ? ? ? ? ?Final  Clinical Impression(s) / ED Diagnoses ?Final diagnoses:  ?Dyspnea, unspecified type  ? ? ?Rx / DC Orders ?ED Discharge Orders   ? ?      Ordered  ?  Ambulatory referral to Social Work       ?Comments: Patient and family

## 2021-04-14 NOTE — ED Triage Notes (Signed)
Patient coming from home with c/o SOB started this morning. She is not on oxygen at home. ?

## 2021-05-03 DIAGNOSIS — I10 Essential (primary) hypertension: Secondary | ICD-10-CM | POA: Diagnosis not present

## 2021-05-03 DIAGNOSIS — R350 Frequency of micturition: Secondary | ICD-10-CM | POA: Diagnosis not present

## 2021-05-30 DIAGNOSIS — Z20822 Contact with and (suspected) exposure to covid-19: Secondary | ICD-10-CM | POA: Diagnosis not present

## 2021-07-12 DIAGNOSIS — M1990 Unspecified osteoarthritis, unspecified site: Secondary | ICD-10-CM | POA: Diagnosis not present

## 2021-07-12 DIAGNOSIS — R109 Unspecified abdominal pain: Secondary | ICD-10-CM | POA: Diagnosis not present

## 2021-07-12 DIAGNOSIS — I10 Essential (primary) hypertension: Secondary | ICD-10-CM | POA: Diagnosis not present

## 2021-07-12 DIAGNOSIS — I5032 Chronic diastolic (congestive) heart failure: Secondary | ICD-10-CM | POA: Diagnosis not present

## 2021-07-12 DIAGNOSIS — Z87440 Personal history of urinary (tract) infections: Secondary | ICD-10-CM | POA: Diagnosis not present

## 2021-07-12 DIAGNOSIS — I2510 Atherosclerotic heart disease of native coronary artery without angina pectoris: Secondary | ICD-10-CM | POA: Diagnosis not present

## 2021-07-26 DIAGNOSIS — I2510 Atherosclerotic heart disease of native coronary artery without angina pectoris: Secondary | ICD-10-CM | POA: Diagnosis not present

## 2021-07-26 DIAGNOSIS — R109 Unspecified abdominal pain: Secondary | ICD-10-CM | POA: Diagnosis not present

## 2021-07-26 DIAGNOSIS — I10 Essential (primary) hypertension: Secondary | ICD-10-CM | POA: Diagnosis not present

## 2021-07-26 DIAGNOSIS — R198 Other specified symptoms and signs involving the digestive system and abdomen: Secondary | ICD-10-CM | POA: Diagnosis not present

## 2021-07-26 DIAGNOSIS — M1990 Unspecified osteoarthritis, unspecified site: Secondary | ICD-10-CM | POA: Diagnosis not present

## 2021-07-26 DIAGNOSIS — I5032 Chronic diastolic (congestive) heart failure: Secondary | ICD-10-CM | POA: Diagnosis not present

## 2021-07-26 DIAGNOSIS — Z87440 Personal history of urinary (tract) infections: Secondary | ICD-10-CM | POA: Diagnosis not present

## 2021-09-06 DIAGNOSIS — Z681 Body mass index (BMI) 19 or less, adult: Secondary | ICD-10-CM | POA: Diagnosis not present

## 2021-09-06 DIAGNOSIS — R0989 Other specified symptoms and signs involving the circulatory and respiratory systems: Secondary | ICD-10-CM | POA: Diagnosis not present

## 2021-09-06 DIAGNOSIS — U071 COVID-19: Secondary | ICD-10-CM | POA: Diagnosis not present

## 2021-09-28 DIAGNOSIS — K529 Noninfective gastroenteritis and colitis, unspecified: Secondary | ICD-10-CM | POA: Diagnosis not present

## 2021-09-28 DIAGNOSIS — Z23 Encounter for immunization: Secondary | ICD-10-CM | POA: Diagnosis not present

## 2021-09-28 DIAGNOSIS — F322 Major depressive disorder, single episode, severe without psychotic features: Secondary | ICD-10-CM | POA: Diagnosis not present

## 2021-09-28 DIAGNOSIS — R42 Dizziness and giddiness: Secondary | ICD-10-CM | POA: Diagnosis not present

## 2021-09-28 DIAGNOSIS — I2510 Atherosclerotic heart disease of native coronary artery without angina pectoris: Secondary | ICD-10-CM | POA: Diagnosis not present

## 2021-09-28 DIAGNOSIS — R630 Anorexia: Secondary | ICD-10-CM | POA: Diagnosis not present

## 2021-10-22 DIAGNOSIS — R4182 Altered mental status, unspecified: Secondary | ICD-10-CM | POA: Diagnosis not present

## 2021-10-30 DIAGNOSIS — Z23 Encounter for immunization: Secondary | ICD-10-CM | POA: Diagnosis not present

## 2021-10-30 DIAGNOSIS — E7800 Pure hypercholesterolemia, unspecified: Secondary | ICD-10-CM | POA: Diagnosis not present

## 2021-10-30 DIAGNOSIS — F322 Major depressive disorder, single episode, severe without psychotic features: Secondary | ICD-10-CM | POA: Diagnosis not present

## 2021-10-30 DIAGNOSIS — N390 Urinary tract infection, site not specified: Secondary | ICD-10-CM | POA: Diagnosis not present

## 2021-10-30 DIAGNOSIS — I10 Essential (primary) hypertension: Secondary | ICD-10-CM | POA: Diagnosis not present

## 2021-10-30 DIAGNOSIS — I2510 Atherosclerotic heart disease of native coronary artery without angina pectoris: Secondary | ICD-10-CM | POA: Diagnosis not present

## 2021-11-21 ENCOUNTER — Emergency Department (HOSPITAL_COMMUNITY): Payer: Medicare Other

## 2021-11-21 ENCOUNTER — Inpatient Hospital Stay (HOSPITAL_COMMUNITY)
Admission: EM | Admit: 2021-11-21 | Discharge: 2021-11-25 | DRG: 542 | Disposition: A | Discharge status: Skilled Nursing Facility | Payer: Medicare Other | Attending: Internal Medicine | Admitting: Internal Medicine

## 2021-11-21 ENCOUNTER — Encounter (HOSPITAL_COMMUNITY): Payer: Self-pay

## 2021-11-21 DIAGNOSIS — S065X9D Traumatic subdural hemorrhage with loss of consciousness of unspecified duration, subsequent encounter: Secondary | ICD-10-CM | POA: Diagnosis not present

## 2021-11-21 DIAGNOSIS — R0609 Other forms of dyspnea: Secondary | ICD-10-CM | POA: Diagnosis present

## 2021-11-21 DIAGNOSIS — Z87440 Personal history of urinary (tract) infections: Secondary | ICD-10-CM

## 2021-11-21 DIAGNOSIS — D696 Thrombocytopenia, unspecified: Secondary | ICD-10-CM | POA: Diagnosis not present

## 2021-11-21 DIAGNOSIS — Z8249 Family history of ischemic heart disease and other diseases of the circulatory system: Secondary | ICD-10-CM

## 2021-11-21 DIAGNOSIS — R296 Repeated falls: Secondary | ICD-10-CM | POA: Diagnosis present

## 2021-11-21 DIAGNOSIS — S299XXA Unspecified injury of thorax, initial encounter: Secondary | ICD-10-CM | POA: Diagnosis not present

## 2021-11-21 DIAGNOSIS — R079 Chest pain, unspecified: Secondary | ICD-10-CM | POA: Diagnosis not present

## 2021-11-21 DIAGNOSIS — I272 Pulmonary hypertension, unspecified: Secondary | ICD-10-CM | POA: Diagnosis not present

## 2021-11-21 DIAGNOSIS — N390 Urinary tract infection, site not specified: Secondary | ICD-10-CM | POA: Diagnosis present

## 2021-11-21 DIAGNOSIS — Z79890 Hormone replacement therapy (postmenopausal): Secondary | ICD-10-CM | POA: Diagnosis not present

## 2021-11-21 DIAGNOSIS — L89892 Pressure ulcer of other site, stage 2: Secondary | ICD-10-CM | POA: Diagnosis not present

## 2021-11-21 DIAGNOSIS — Z9181 History of falling: Secondary | ICD-10-CM | POA: Diagnosis not present

## 2021-11-21 DIAGNOSIS — G9341 Metabolic encephalopathy: Secondary | ICD-10-CM | POA: Diagnosis not present

## 2021-11-21 DIAGNOSIS — Z8616 Personal history of COVID-19: Secondary | ICD-10-CM | POA: Diagnosis not present

## 2021-11-21 DIAGNOSIS — B9620 Unspecified Escherichia coli [E. coli] as the cause of diseases classified elsewhere: Secondary | ICD-10-CM | POA: Diagnosis present

## 2021-11-21 DIAGNOSIS — M8580 Other specified disorders of bone density and structure, unspecified site: Secondary | ICD-10-CM | POA: Diagnosis present

## 2021-11-21 DIAGNOSIS — M40204 Unspecified kyphosis, thoracic region: Secondary | ICD-10-CM | POA: Diagnosis not present

## 2021-11-21 DIAGNOSIS — Z6820 Body mass index (BMI) 20.0-20.9, adult: Secondary | ICD-10-CM

## 2021-11-21 DIAGNOSIS — K8689 Other specified diseases of pancreas: Secondary | ICD-10-CM | POA: Diagnosis not present

## 2021-11-21 DIAGNOSIS — Z9049 Acquired absence of other specified parts of digestive tract: Secondary | ICD-10-CM

## 2021-11-21 DIAGNOSIS — W19XXXA Unspecified fall, initial encounter: Secondary | ICD-10-CM | POA: Diagnosis not present

## 2021-11-21 DIAGNOSIS — Z043 Encounter for examination and observation following other accident: Secondary | ICD-10-CM | POA: Diagnosis not present

## 2021-11-21 DIAGNOSIS — S3991XA Unspecified injury of abdomen, initial encounter: Secondary | ICD-10-CM | POA: Diagnosis not present

## 2021-11-21 DIAGNOSIS — B961 Klebsiella pneumoniae [K. pneumoniae] as the cause of diseases classified elsewhere: Secondary | ICD-10-CM | POA: Diagnosis not present

## 2021-11-21 DIAGNOSIS — Z66 Do not resuscitate: Secondary | ICD-10-CM | POA: Diagnosis present

## 2021-11-21 DIAGNOSIS — R109 Unspecified abdominal pain: Secondary | ICD-10-CM | POA: Diagnosis not present

## 2021-11-21 DIAGNOSIS — N3000 Acute cystitis without hematuria: Secondary | ICD-10-CM

## 2021-11-21 DIAGNOSIS — S22080A Wedge compression fracture of T11-T12 vertebra, initial encounter for closed fracture: Secondary | ICD-10-CM

## 2021-11-21 DIAGNOSIS — N179 Acute kidney failure, unspecified: Secondary | ICD-10-CM | POA: Diagnosis not present

## 2021-11-21 DIAGNOSIS — N183 Chronic kidney disease, stage 3 unspecified: Secondary | ICD-10-CM | POA: Diagnosis present

## 2021-11-21 DIAGNOSIS — K449 Diaphragmatic hernia without obstruction or gangrene: Secondary | ICD-10-CM | POA: Diagnosis not present

## 2021-11-21 DIAGNOSIS — Z880 Allergy status to penicillin: Secondary | ICD-10-CM

## 2021-11-21 DIAGNOSIS — M542 Cervicalgia: Secondary | ICD-10-CM | POA: Diagnosis not present

## 2021-11-21 DIAGNOSIS — Z79899 Other long term (current) drug therapy: Secondary | ICD-10-CM | POA: Diagnosis not present

## 2021-11-21 DIAGNOSIS — S22080D Wedge compression fracture of T11-T12 vertebra, subsequent encounter for fracture with routine healing: Secondary | ICD-10-CM | POA: Diagnosis not present

## 2021-11-21 DIAGNOSIS — M419 Scoliosis, unspecified: Secondary | ICD-10-CM | POA: Diagnosis present

## 2021-11-21 DIAGNOSIS — S060XAA Concussion with loss of consciousness status unknown, initial encounter: Secondary | ICD-10-CM | POA: Diagnosis present

## 2021-11-21 DIAGNOSIS — E43 Unspecified severe protein-calorie malnutrition: Secondary | ICD-10-CM | POA: Diagnosis present

## 2021-11-21 DIAGNOSIS — Z888 Allergy status to other drugs, medicaments and biological substances status: Secondary | ICD-10-CM

## 2021-11-21 DIAGNOSIS — I10 Essential (primary) hypertension: Secondary | ICD-10-CM | POA: Diagnosis present

## 2021-11-21 DIAGNOSIS — W010XXA Fall on same level from slipping, tripping and stumbling without subsequent striking against object, initial encounter: Secondary | ICD-10-CM | POA: Diagnosis present

## 2021-11-21 DIAGNOSIS — M4316 Spondylolisthesis, lumbar region: Secondary | ICD-10-CM | POA: Diagnosis not present

## 2021-11-21 DIAGNOSIS — Y92009 Unspecified place in unspecified non-institutional (private) residence as the place of occurrence of the external cause: Secondary | ICD-10-CM | POA: Diagnosis not present

## 2021-11-21 DIAGNOSIS — I130 Hypertensive heart and chronic kidney disease with heart failure and stage 1 through stage 4 chronic kidney disease, or unspecified chronic kidney disease: Secondary | ICD-10-CM | POA: Diagnosis present

## 2021-11-21 DIAGNOSIS — M47816 Spondylosis without myelopathy or radiculopathy, lumbar region: Secondary | ICD-10-CM | POA: Diagnosis present

## 2021-11-21 DIAGNOSIS — J9811 Atelectasis: Secondary | ICD-10-CM | POA: Diagnosis not present

## 2021-11-21 DIAGNOSIS — Z7401 Bed confinement status: Secondary | ICD-10-CM | POA: Diagnosis not present

## 2021-11-21 DIAGNOSIS — R1084 Generalized abdominal pain: Secondary | ICD-10-CM | POA: Diagnosis not present

## 2021-11-21 DIAGNOSIS — E785 Hyperlipidemia, unspecified: Secondary | ICD-10-CM | POA: Diagnosis present

## 2021-11-21 DIAGNOSIS — I2510 Atherosclerotic heart disease of native coronary artery without angina pectoris: Secondary | ICD-10-CM | POA: Diagnosis present

## 2021-11-21 DIAGNOSIS — N1831 Chronic kidney disease, stage 3a: Secondary | ICD-10-CM | POA: Diagnosis not present

## 2021-11-21 DIAGNOSIS — I959 Hypotension, unspecified: Secondary | ICD-10-CM | POA: Diagnosis not present

## 2021-11-21 DIAGNOSIS — I5042 Chronic combined systolic (congestive) and diastolic (congestive) heart failure: Secondary | ICD-10-CM | POA: Diagnosis present

## 2021-11-21 DIAGNOSIS — R102 Pelvic and perineal pain: Secondary | ICD-10-CM | POA: Diagnosis not present

## 2021-11-21 DIAGNOSIS — E876 Hypokalemia: Secondary | ICD-10-CM | POA: Diagnosis present

## 2021-11-21 DIAGNOSIS — M170 Bilateral primary osteoarthritis of knee: Secondary | ICD-10-CM | POA: Diagnosis not present

## 2021-11-21 DIAGNOSIS — I509 Heart failure, unspecified: Secondary | ICD-10-CM

## 2021-11-21 DIAGNOSIS — R609 Edema, unspecified: Secondary | ICD-10-CM | POA: Diagnosis not present

## 2021-11-21 DIAGNOSIS — S0990XA Unspecified injury of head, initial encounter: Secondary | ICD-10-CM | POA: Diagnosis not present

## 2021-11-21 DIAGNOSIS — K922 Gastrointestinal hemorrhage, unspecified: Secondary | ICD-10-CM | POA: Diagnosis not present

## 2021-11-21 DIAGNOSIS — K5730 Diverticulosis of large intestine without perforation or abscess without bleeding: Secondary | ICD-10-CM | POA: Diagnosis not present

## 2021-11-21 DIAGNOSIS — M47812 Spondylosis without myelopathy or radiculopathy, cervical region: Secondary | ICD-10-CM | POA: Diagnosis not present

## 2021-11-21 DIAGNOSIS — M4854XA Collapsed vertebra, not elsewhere classified, thoracic region, initial encounter for fracture: Secondary | ICD-10-CM | POA: Diagnosis not present

## 2021-11-21 DIAGNOSIS — L8990 Pressure ulcer of unspecified site, unspecified stage: Secondary | ICD-10-CM | POA: Insufficient documentation

## 2021-11-21 DIAGNOSIS — G319 Degenerative disease of nervous system, unspecified: Secondary | ICD-10-CM | POA: Diagnosis not present

## 2021-11-21 LAB — URINALYSIS, ROUTINE W REFLEX MICROSCOPIC
Bilirubin Urine: NEGATIVE
Glucose, UA: NEGATIVE mg/dL
Ketones, ur: NEGATIVE mg/dL
Nitrite: NEGATIVE
Protein, ur: NEGATIVE mg/dL
Specific Gravity, Urine: 1.032 — ABNORMAL HIGH (ref 1.005–1.030)
pH: 5 (ref 5.0–8.0)

## 2021-11-21 LAB — I-STAT CHEM 8, ED
BUN: 14 mg/dL (ref 8–23)
Calcium, Ion: 1.16 mmol/L (ref 1.15–1.40)
Chloride: 109 mmol/L (ref 98–111)
Creatinine, Ser: 0.9 mg/dL (ref 0.44–1.00)
Glucose, Bld: 98 mg/dL (ref 70–99)
HCT: 35 % — ABNORMAL LOW (ref 36.0–46.0)
Hemoglobin: 11.9 g/dL — ABNORMAL LOW (ref 12.0–15.0)
Potassium: 3.8 mmol/L (ref 3.5–5.1)
Sodium: 143 mmol/L (ref 135–145)
TCO2: 24 mmol/L (ref 22–32)

## 2021-11-21 LAB — COMPREHENSIVE METABOLIC PANEL
ALT: 7 U/L (ref 0–44)
AST: 17 U/L (ref 15–41)
Albumin: 3 g/dL — ABNORMAL LOW (ref 3.5–5.0)
Alkaline Phosphatase: 66 U/L (ref 38–126)
Anion gap: 13 (ref 5–15)
BUN: 11 mg/dL (ref 8–23)
CO2: 23 mmol/L (ref 22–32)
Calcium: 9 mg/dL (ref 8.9–10.3)
Chloride: 107 mmol/L (ref 98–111)
Creatinine, Ser: 0.98 mg/dL (ref 0.44–1.00)
GFR, Estimated: 51 mL/min — ABNORMAL LOW (ref >60)
Glucose, Bld: 101 mg/dL — ABNORMAL HIGH (ref 70–99)
Potassium: 3.7 mmol/L (ref 3.5–5.1)
Sodium: 143 mmol/L (ref 135–145)
Total Bilirubin: 0.1 mg/dL — ABNORMAL LOW (ref 0.3–1.2)
Total Protein: 7.1 g/dL (ref 6.5–8.1)

## 2021-11-21 LAB — CBC WITH DIFFERENTIAL/PLATELET
Abs Immature Granulocytes: 0.08 10*3/uL — ABNORMAL HIGH (ref 0.00–0.07)
Basophils Absolute: 0 10*3/uL (ref 0.0–0.1)
Basophils Relative: 0 %
Eosinophils Absolute: 0 10*3/uL (ref 0.0–0.5)
Eosinophils Relative: 0 %
HCT: 35.3 % — ABNORMAL LOW (ref 36.0–46.0)
Hemoglobin: 11.1 g/dL — ABNORMAL LOW (ref 12.0–15.0)
Immature Granulocytes: 1 %
Lymphocytes Relative: 31 %
Lymphs Abs: 2.1 10*3/uL (ref 0.7–4.0)
MCH: 29.7 pg (ref 26.0–34.0)
MCHC: 31.4 g/dL (ref 30.0–36.0)
MCV: 94.4 fL (ref 80.0–100.0)
Monocytes Absolute: 0.8 10*3/uL (ref 0.1–1.0)
Monocytes Relative: 12 %
Neutro Abs: 3.8 10*3/uL (ref 1.7–7.7)
Neutrophils Relative %: 56 %
Platelets: 191 10*3/uL (ref 150–400)
RBC: 3.74 MIL/uL — ABNORMAL LOW (ref 3.87–5.11)
RDW: 14.7 % (ref 11.5–15.5)
WBC: 6.7 10*3/uL (ref 4.0–10.5)
nRBC: 0 % (ref 0.0–0.2)

## 2021-11-21 LAB — LIPASE, BLOOD: Lipase: 32 U/L (ref 11–51)

## 2021-11-21 LAB — TROPONIN I (HIGH SENSITIVITY)
Troponin I (High Sensitivity): 11 ng/L (ref <18)
Troponin I (High Sensitivity): 11 ng/L (ref <18)

## 2021-11-21 MED ORDER — IOHEXOL 350 MG/ML SOLN
75.0000 mL | Freq: Once | INTRAVENOUS | Status: AC | PRN
  Administered 2021-11-21: 75 mL via INTRAVENOUS

## 2021-11-21 MED ORDER — ACETAMINOPHEN 325 MG PO TABS
650.0000 mg | ORAL_TABLET | Freq: Four times a day (QID) | ORAL | Status: DC | PRN
  Administered 2021-11-23 – 2021-11-24 (×5): 650 mg via ORAL
  Filled 2021-11-21 (×5): qty 2

## 2021-11-21 MED ORDER — ALBUTEROL SULFATE (2.5 MG/3ML) 0.083% IN NEBU: 2.5000 mg | INHALATION_SOLUTION | RESPIRATORY_TRACT | Status: DC | PRN

## 2021-11-21 MED ORDER — ACETAMINOPHEN 325 MG PO TABS
650.0000 mg | ORAL_TABLET | Freq: Once | ORAL | Status: AC
  Administered 2021-11-21: 650 mg via ORAL
  Filled 2021-11-21: qty 2

## 2021-11-21 MED ORDER — SODIUM CHLORIDE 0.9 % IV SOLN
1.0000 g | Freq: Once | INTRAVENOUS | Status: AC
  Administered 2021-11-21: 1 g via INTRAVENOUS
  Filled 2021-11-21: qty 10

## 2021-11-21 MED ORDER — ACETAMINOPHEN 650 MG RE SUPP: 650.0000 mg | Freq: Four times a day (QID) | RECTAL | Status: DC | PRN

## 2021-11-21 NOTE — Progress Notes (Signed)
Orthopedic Tech Progress Note Patient Details:  [name] April 09, 1916 [id number] Level 2 Trauma  Patient ID: [name], female   DOB: 08/27/1916, 88 y.o.   MRN: [id number]  Jearld Lesch 11/21/2021, 4:01 PM

## 2021-11-21 NOTE — Progress Notes (Signed)
Orthopedic Tech Progress Note Patient Details:  [name] Jan 15, 1917 [id number]  Patient ID: [name], female   DOB: [birth year], 88 y.o.   MRN: [id number] Notified RN of an abrasion spotted on the patients back prior to applying the TLSO brace. Vernona Rieger 11/21/2021, 8:55 PM

## 2021-11-21 NOTE — ED Provider Notes (Signed)
[facility] EMERGENCY DEPARTMENT Provider Note   CSN: [id number] Arrival date & time: 11/21/21  1554     History  Chief Complaint  Patient presents with   [name] is a 88 y.o. female.  Patient brought in from home as trauma after falling backwards striking her head.  She complains of head pain, neck pain and abdominal pain.  States she was trying to wash her hands and stepped backwards losing her balance and falling striking her head.  Has pain to the back of her head, her right neck has a scrape to her thoracic back per EMS.  Also complaining of abdominal pain which is new since the fall but denies any pain prior to the fall.  Family apparently concern for UTI as she has frequent falls.  Patient denies any preceding dizziness or lightheadedness.  No chest pain or shortness of breath.  Complains of pain to her head, neck and mid back She states her abdomen was not hurting prior to the fall and denies hitting it during the fall.  History of pulmonary hypertension, CKD, CHF, hyperlipidemia and hypertension.  The history is provided by the patient and the EMS personnel.  Fall Associated symptoms include abdominal pain and headaches. Pertinent negatives include no shortness of breath.       Home Medications Prior to Admission medications   Medication Sig Start Date End Date Taking? Authorizing Provider  acetaminophen (TYLENOL) 325 MG tablet Take 325-650 mg by mouth every 6 (six) hours as needed for mild pain.    [provider]  ANUCORT-HC 25 MG suppository Place 1 suppository rectally daily as needed for hemorrhoids. 02/07/20   [provider]  clotrimazole (LOTRIMIN) 1 % cream Apply 1 application topically 2 (two) times daily as needed (rash).    [provider]  feeding supplement (ENSURE ENLIVE / ENSURE PLUS) LIQD Take 237 mLs by mouth 2 (two) times daily between meals. 05/29/20   Mercy Riding, MD  furosemide (LASIX)  20 MG tablet Take 1 tablet (20 mg total) by mouth every Monday, Wednesday, and Friday. Take 0.5 tablet by mouth Monday, Wednesday, Friday & Saturday 05/29/20   Mercy Riding, MD  isosorbide mononitrate (IMDUR) 30 MG 24 hr tablet Take 0.5 tablets (15 mg total) by mouth daily for 14 days. Patient taking differently: Take 30 mg by mouth daily. 03/15/20 03/29/20  Kayleen Memos, DO  metoprolol succinate (TOPROL-XL) 25 MG 24 hr tablet Take 0.5 tablets (12.5 mg total) by mouth daily for 14 days. Patient taking differently: Take 25 mg by mouth in the morning and at bedtime. 03/15/20 03/29/20  Kayleen Memos, DO  Multiple Vitamin (MULTIVITAMIN WITH MINERALS) TABS tablet Take 1 tablet by mouth daily. 05/30/20   Mercy Riding, MD  PARoxetine (PAXIL) 10 MG tablet Take 10 mg by mouth daily. 03/10/20   [provider]  polyethylene glycol (MIRALAX) packet Take 17 g by mouth daily as needed for mild constipation. 08/21/17   Jani Gravel, MD  potassium chloride (KLOR-CON) 10 MEQ tablet Take 1 tablet (10 mEq total) by mouth every Monday, Wednesday, and Friday. 05/29/20   Mercy Riding, MD  Propylene Glycol (SYSTANE BALANCE) 0.6 % SOLN Place 1 drop into both eyes daily as needed (dry eyes).    [provider]  senna-docusate (SENOKOT-S) 8.6-50 MG tablet Take 1 tablet by mouth 2 (two) times daily as needed for mild constipation or moderate constipation. 05/29/20   Cyndia Skeeters,  Charlesetta Ivory, MD      Allergies    Ace inhibitors, Streptomycin, Tramadol, Vesicare [solifenacin succinate], and Penicillins    Review of Systems   Review of Systems  Constitutional:  Negative for activity change, appetite change and fever.  Respiratory:  Negative for chest tightness and shortness of breath.   Gastrointestinal:  Positive for abdominal pain. Negative for diarrhea, nausea and vomiting.  Genitourinary:  Negative for dysuria and hematuria.  Musculoskeletal:  Positive for arthralgias, back pain, myalgias and neck pain.  Neurological:   Positive for weakness and headaches.   all other systems are negative except as noted in the HPI and PMH.    Physical Exam Updated Vital Signs BP (!) 156/72   Pulse 64   Temp 98 F (36.7 C) (Oral)   Resp 16   Ht '5\' 6"'$  (1.676 m)   Wt 57.6 kg   SpO2 100%   BMI 20.50 kg/m  Physical Exam Vitals and nursing note reviewed.  Constitutional:      General: She is not in acute distress.    Appearance: She is well-developed.  HENT:     Head: Normocephalic.     Comments: Hematoma left occiput.    Mouth/Throat:     Pharynx: No oropharyngeal exudate.     Comments: No septal hematoma or hemotympanum Eyes:     Conjunctiva/sclera: Conjunctivae normal.     Pupils: Pupils are equal, round, and reactive to light.  Neck:     Comments: Diffuse paraspinal tenderness on the right.  C-collar placed on arrival. Cardiovascular:     Rate and Rhythm: Normal rate and regular rhythm.     Heart sounds: Normal heart sounds. No murmur heard. Pulmonary:     Effort: Pulmonary effort is normal. No respiratory distress.     Breath sounds: Normal breath sounds.  Abdominal:     Palpations: Abdomen is soft.     Tenderness: There is abdominal tenderness. There is no guarding or rebound.     Comments: Periumbilical tenderness with possible bladder fullness and right lower quadrant tenderness.  Musculoskeletal:        General: Tenderness present. Normal range of motion.     Cervical back: Normal range of motion and neck supple.     Right lower leg: Edema present.     Left lower leg: Edema present.     Comments: Abrasion mid thoracic spine, no step-offs or deformities.  Pelvis is stable, able to range hips without pain bilaterally  Skin:    General: Skin is warm.  Neurological:     Mental Status: She is alert and oriented to person, place, and time.     Cranial Nerves: No cranial nerve deficit.     Motor: No abnormal muscle tone.     Coordination: Coordination normal.     Comments:  5/5 strength  throughout. CN 2-12 intact.Equal grip strength.   Psychiatric:        Behavior: Behavior normal.     ED Results / Procedures / Treatments   Labs (all labs ordered are listed, but only abnormal results are displayed) Labs Reviewed  CBC WITH DIFFERENTIAL/PLATELET - Abnormal; Notable for the following components:      Result Value   RBC 3.74 (*)    Hemoglobin 11.1 (*)    HCT 35.3 (*)    Abs Immature Granulocytes 0.08 (*)    All other components within normal limits  COMPREHENSIVE METABOLIC PANEL - Abnormal; Notable for the following components:   Glucose, Bld 101 (*)  Albumin 3.0 (*)    Total Bilirubin 0.1 (*)    GFR, Estimated 51 (*)    All other components within normal limits  URINALYSIS, ROUTINE W REFLEX MICROSCOPIC - Abnormal; Notable for the following components:   APPearance HAZY (*)    Specific Gravity, Urine 1.032 (*)    Hgb urine dipstick SMALL (*)    Leukocytes,Ua TRACE (*)    Bacteria, UA MANY (*)    All other components within normal limits  I-STAT CHEM 8, ED - Abnormal; Notable for the following components:   Hemoglobin 11.9 (*)    HCT 35.0 (*)    All other components within normal limits  URINE CULTURE  LIPASE, BLOOD  TROPONIN I (HIGH SENSITIVITY)  TROPONIN I (HIGH SENSITIVITY)    EKG EKG Interpretation  Date/Time:  Wednesday November 21 2021 19:04:33 EDT Ventricular Rate:  63 PR Interval:  220 QRS Duration: 88 QT Interval:  458 QTC Calculation: 469 R Axis:   -31 Text Interpretation: Sinus rhythm Multiple ventricular premature complexes Prolonged PR interval Probable left ventricular hypertrophy Anterior Q waves, possibly due to LVH No significant change was found Confirmed by Ezequiel Essex [phone]) on 11/21/2021 7:07:30 PM  Radiology CT CHEST ABDOMEN PELVIS W CONTRAST  Result Date: 11/21/2021 CLINICAL DATA:  Polytrauma, blunt.  Fall EXAM: CT CHEST, ABDOMEN, AND PELVIS WITH CONTRAST TECHNIQUE: Multidetector CT imaging of the chest, abdomen and  pelvis was performed following the standard protocol during bolus administration of intravenous contrast. RADIATION DOSE REDUCTION: This exam was performed according to the departmental dose-optimization program which includes automated exposure control, adjustment of the mA and/or kV according to patient size and/or use of iterative reconstruction technique. CONTRAST:  53m OMNIPAQUE IOHEXOL 350 MG/ML SOLN COMPARISON:  None Available. FINDINGS: CHEST: Cardiovascular: No aortic injury. The thoracic aorta is normal in caliber. The heart is normal in size. No significant pericardial effusion. Atherosclerotic plaque. Mediastinum/Nodes: No pneumomediastinum. No mediastinal hematoma. The esophagus is unremarkable. At least moderate volume hiatal hernia. The thyroid is unremarkable. The central airways are patent. No mediastinal, hilar, or axillary lymphadenopathy. Lungs/Pleura: Bilateral lower lobe subsegmental atelectasis. No focal consolidation. No pulmonary nodule. No pulmonary mass. No pulmonary contusion or laceration. No pneumatocele formation. No pleural effusion. No pneumothorax. No hemothorax. Musculoskeletal/Chest wall: No chest wall mass. No acute rib or sternal fracture. Please see separately dictated CT thoracolumbar spine 11/21/2021. Degenerative Changes of the partially visualized right upper extremity. ABDOMEN / PELVIS: Hepatobiliary: Not enlarged. No focal lesion. No laceration or subcapsular hematoma. The gallbladder is otherwise unremarkable with no radio-opaque gallstones. Intra and extrahepatic biliary ductal dilatation. Pancreas: Normal pancreatic contour. Main pancreatic duct dilatation measuring up to 6 mm. Spleen: Not enlarged. No focal lesion. No laceration, subcapsular hematoma, or vascular injury. Adrenals/Urinary Tract: No nodularity bilaterally. Bilateral kidneys enhance symmetrically. No hydronephrosis. No contusion, laceration, or subcapsular hematoma. Subcentimeter hypodensities are too  small to characterize. No injury to the vascular structures or collecting systems. No hydroureter. The urinary bladder is unremarkable. Stomach/Bowel: No small or large bowel wall thickening or dilatation. The appendix is not definitely identified with no inflammatory changes in the right lower quadrant to suggest acute appendicitis. Vasculature/Lymphatics: No abdominal aorta or iliac aneurysm. No active contrast extravasation or pseudoaneurysm. No abdominal, pelvic, inguinal lymphadenopathy. Reproductive: Status post hysterectomy.  No adnexal masses. Other: No simple free fluid ascites. No pneumoperitoneum. No hemoperitoneum. No mesenteric hematoma identified. No organized fluid collection. Musculoskeletal: No significant soft tissue hematoma. No acute pelvic fracture. Please see separately dictated CT thoracolumbar  spine 11/21/2021. Ports and Devices: None. IMPRESSION: 1. No acute traumatic injury to the chest, abdomen, or pelvis. 2. Please see separately dictated CT thoracolumbar spine 11/21/2021. 3. Other imaging findings of potential clinical significance: At least moderate volume hiatal hernia. 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 Colonic diverticulosis with no acute diverticulitis. Electronically Signed   By: Iven Finn M.D.   On: 11/21/2021 18:15   CT Head Wo Contrast  Result Date: 11/21/2021 CLINICAL DATA:  Fall.  On blood thinner EXAM: CT HEAD WITHOUT CONTRAST CT CERVICAL SPINE WITHOUT CONTRAST TECHNIQUE: Multidetector CT imaging of the head and cervical spine was performed following the standard protocol without intravenous contrast. Multiplanar CT image reconstructions of the cervical spine were also generated. RADIATION DOSE REDUCTION: This exam was performed according to the departmental dose-optimization program which includes automated exposure control, adjustment of the mA and/or kV according to patient size and/or use of iterative reconstruction technique.  COMPARISON:  CT head 08/11/2019 FINDINGS: CT HEAD FINDINGS Brain: Motion degraded study. Generalized atrophy. Chronic microvascular ischemic change in the white matter. Interval resolution of subdural hematoma on the left Negative for acute infarct, hemorrhage, mass Vascular: Negative for hyperdense vessel Skull: Negative Sinuses/Orbits: Mild mucosal edema paranasal sinuses. Bilateral cataract extraction Other: None CT CERVICAL SPINE FINDINGS Alignment: Mild anterolisthesis C3-4 and C4-5 and C7-T1 Skull base and vertebrae: Negative for fracture. Cystic endplate changes at J6-9 and C6-7 as noted in 2018 CT. Soft tissues and spinal canal: Negative for soft tissue mass or swelling Disc levels: Advanced spondylosis. Multilevel disc and facet degeneration. Foraminal narrowing bilaterally at multiple levels due to spurring. Central canal patent. Upper chest: Lung apices clear bilaterally Other: None IMPRESSION: No acute intracranial abnormality. Atrophy and chronic microvascular ischemic change in the white matter. Cervical spondylosis. Negative for fracture. Electronically Signed   By: Franchot Gallo M.D.   On: 11/21/2021 18:08   CT Cervical Spine Wo Contrast  Result Date: 11/21/2021 CLINICAL DATA:  Fall.  On blood thinner EXAM: CT HEAD WITHOUT CONTRAST CT CERVICAL SPINE WITHOUT CONTRAST TECHNIQUE: Multidetector CT imaging of the head and cervical spine was performed following the standard protocol without intravenous contrast. Multiplanar CT image reconstructions of the cervical spine were also generated. RADIATION DOSE REDUCTION: This exam was performed according to the departmental dose-optimization program which includes automated exposure control, adjustment of the mA and/or kV according to patient size and/or use of iterative reconstruction technique. COMPARISON:  CT head 08/11/2019 FINDINGS: CT HEAD FINDINGS Brain: Motion degraded study. Generalized atrophy. Chronic microvascular ischemic change in the white  matter. Interval resolution of subdural hematoma on the left Negative for acute infarct, hemorrhage, mass Vascular: Negative for hyperdense vessel Skull: Negative Sinuses/Orbits: Mild mucosal edema paranasal sinuses. Bilateral cataract extraction Other: None CT CERVICAL SPINE FINDINGS Alignment: Mild anterolisthesis C3-4 and C4-5 and C7-T1 Skull base and vertebrae: Negative for fracture. Cystic endplate changes at C7-8 and C6-7 as noted in 2018 CT. Soft tissues and spinal canal: Negative for soft tissue mass or swelling Disc levels: Advanced spondylosis. Multilevel disc and facet degeneration. Foraminal narrowing bilaterally at multiple levels due to spurring. Central canal patent. Upper chest: Lung apices clear bilaterally Other: None IMPRESSION: No acute intracranial abnormality. Atrophy and chronic microvascular ischemic change in the white matter. Cervical spondylosis. Negative for fracture. Electronically Signed   By: Franchot Gallo M.D.   On: 11/21/2021 18:08   DG Pelvis Portable  Result Date: 11/21/2021 CLINICAL DATA:  Neck and back pain after fall EXAM: PORTABLE  PELVIS 1-2 VIEWS COMPARISON:  07/23/2018 FINDINGS: Supine frontal view of the pelvis includes both hips. No acute displaced fracture, subluxation, or dislocation. There is severe bilateral hip osteoarthritis again noted. The sacroiliac joints are unremarkable. Soft tissues are normal. IMPRESSION: 1. Stable bilateral hip osteoarthritis.  No displaced fracture. Electronically Signed   By: Randa Ngo M.D.   On: 11/21/2021 16:21   DG Chest Portable 1 View  Result Date: 11/21/2021 CLINICAL DATA:  Neck and back pain after fall, diffuse abdominal pain EXAM: PORTABLE CHEST 1 VIEW COMPARISON:  04/14/2021 FINDINGS: Single frontal view of the chest demonstrates an unremarkable cardiac silhouette. Continued ectasia of the thoracic aorta. No acute airspace disease, effusion, or pneumothorax. Multilevel thoracolumbar spondylosis. No evidence of acute  fracture. IMPRESSION: 1. No acute intrathoracic process. Electronically Signed   By: Randa Ngo M.D.   On: 11/21/2021 16:20    Procedures Procedures    Medications Ordered in ED Medications - No data to display  ED Course/ Medical Decision Making/ A&P                           Medical Decision Making Amount and/or Complexity of Data Reviewed Labs: ordered. Decision-making details documented in ED Course. Radiology: ordered and independent interpretation performed. Decision-making details documented in ED Course. ECG/medicine tests: ordered and independent interpretation performed. Decision-making details documented in ED Course.  Risk OTC drugs. Prescription drug management. Decision regarding hospitalization.  Fall backwards after losing balance and striking head and back.  No loss of consciousness.  Complains of head, neck, back pain.  C-collar placed on arrival.  She is neurologically intact and moving all extremities.  Also complains of abdominal pain which is new since the fall.  Bladder scan shows 36 cc of urine.  No evidence of retention.  Labs reassuring.  Hemoglobin at baseline, creatinine at baseline.  Chest x-ray negative for infiltrate.  Pelvic x-ray negative.  Results reviewed and interpreted by me.  CT head and C-spine are negative for acute traumatic injury.  C-collar was cleared.  CT abdomen and pelvis does not show any acute traumatic pathology.  Does show hiatal hernia as well as double duct sign. LFTs and lipase are normal.  Discussed with patient's POA and niece [name] at bedside.  She confirms patient is DNR and DNI.  She states even if patient had pancreatic mass they would not want to have further work-up, surgery or radiation or chemotherapy.  Urinalysis is positive for infection.  Culture is sent.  Suspect this may be contributed to her altered mental status and frequent falls.  May have concussion as well.  CT imaging as above.  No gross traumatic  abnormality seen  We will place patient in TLSO brace.  She has no retropulsion or neurological deficits on exam.  Jeannett Senior at bedside reports patient is more confused than her baseline and does not feel comfortable taking her home.  We will plan overnight observation admission for IV antibiotics, IV fluids and monitoring her mental status.  Discussed with Dr. Marcello Moores.       Final Clinical Impression(s) / ED Diagnoses Final diagnoses:  None    Rx / DC Orders ED Discharge Orders     None         Raman Featherston, Annie Main, MD 11/21/21 2234

## 2021-11-21 NOTE — ED Notes (Signed)
Pt taken to CT with TRN at this time.

## 2021-11-21 NOTE — Progress Notes (Signed)
Orthopedic Tech Progress Note Patient Details:  [name] 08/20/1916 [id number]  Ortho Devices Type of Ortho Device: Thoracolumbar corset (TLSO) Ortho Device/Splint Interventions: Ordered, Application, Adjustment   Post Interventions Patient Tolerated: Well Instructions Provided: Adjustment of device TLSO applied with assistance from [facility] (ortho tech), Vernona Rieger 11/21/2021, 8:47 PM

## 2021-11-21 NOTE — ED Triage Notes (Signed)
Pt  BIBA from home. Pt c/o Neck and back pain following a fall. Pt also c/o diffuse ABD pain. Denies LOC on scene. Pt on thinners. Mid thoracic abrasion on back. Hematoma to back of head.   VS BP-130-70 Pulse-66 98 RA CBG-133

## 2021-11-21 NOTE — ED Notes (Signed)
Pt in room A&O x3. Hard of hearing. C/o diffuse abd pain. NV intact distally. Pt able to move toes bilaterally. Updated on plan of care.

## 2021-11-21 NOTE — H&P (Addendum)
History and Physical    [name] LXB:[id number] DOB: 1916-09-03 DOA: 11/21/2021  PCP: Seward Carol, MD  Patient coming from: home  I have personally briefly reviewed patient's old medical records in [place]  Chief Complaint:  fall with injury/ confusion x 1 week   HPI: [name] is a 87 y.o. female with medical history significant for combined CHF, PAH, CAD, CKD-3, prior GI bleed, diverticulosis, HTN and HLD covid 19 ( 8/23 with recovery).recurrent UTI, who over the last week per family has had more frequent falls, as well as noted mild confusion. Patient today had fall at home which resulted in injury.  Patient  fell back wards ,hitting her head and as well as her back.   Due to this patient was brought in for evaluation. Patient in ED had complaint of back, Head and abdominal pain. Patient currently states her pain is well controlled at the moment. When asked about her abdominal pain noted that she has no pain currently and states at time its worse with movement. She notes no  n/v/d/sob/ chest pain/ fever or chills. Of noted  Duwaine Maxin (Relative) POA,[phone] [facility]) she request that all medical information discussed solely with her.  ED Course:  Vitals :afeb,  135/82, hr 79, rr 18 hr 71  sat 94% on ra  Labs: Wbc 6.7, hgb 11.1,plt 191 Na 143, K 3.7, glu 101, cr 0.98,  Lipase 32 CE Cr11 Cxr: NAD Xr pelvis:1. Stable bilateral hip osteoarthritis.  Non- displaced fracture. CTH:No acute intracranial abnormality. Atrophy and chronic microvascular ischemic change in the white matter.  UA + Cervical spondylosis. Negative for fracture.  CT Lumbar spine: IMPRESSION: 1. Acute fracture through the anterior superior endplate of C16, involving the anterior osteophytes, without retropulsion. No additional fracture is seen in the thoracic or lumbar spine, although evaluation is limited by osteopenia. This could be further evaluated with MRI if clinically  indicated. 2. Multilevel degenerative changes in the thoracic and lumbar spine, as described above. 3. For soft tissue findings, please see same-day CT chest abdomen pelvis.   CT abd; 1. No acute traumatic injury to the chest, abdomen, or pelvis.   2. Please see separately dictated CT thoracolumbar spine 11/21/2021. 3. Other imaging findings of potential clinical significance: At least moderate volume hiatal hernia. 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 Colonic diverticulosis with no acute diverticulitis. Review of Systems: As per HPI otherwise 10 point review of systems negative.   Past Medical History:  Diagnosis Date   Allergic rhinitis    CAD (coronary artery disease)    CHF (congestive heart failure) (HCC)    CKD (chronic kidney disease) stage 3, GFR 30-59 ml/min (HCC) 09/12/2014   Diverticular disease    Diverticulosis    DJD (degenerative joint disease) of knee    DJD (degenerative joint disease) of lumbar spine    scoliosis   Eczema    Esophageal erosions    from nsaid    GI bleed    Hormone replacement therapy (postmenopausal)    Hyperglycemia    Hyperlipidemia    Hypertension    Pneumonia    july 2009   Pulmonary hypertension ([place])    a. 2D echo 03/2008 showed normal biventricular thickness, chamber size and systolic function, EF 38-45%, mild mitral regurgitation, aortic sclerosis, moderate pulmonary HTN, pulmonic regurgitation.   PVC's (premature ventricular contractions)    Reactive depression (situational)    Sciatica    right lower extremity  Shingles    right thoracic 2008   Vertigo     Past Surgical History:  Procedure Laterality Date   APPENDECTOMY     BACK SURGERY     BREAST LUMPECTOMY     FLEXIBLE SIGMOIDOSCOPY Left 08/13/2013   Procedure: FLEXIBLE SIGMOIDOSCOPY;  Surgeon: Arta Silence, MD;  Location: [facility];  Service: Endoscopy;  Laterality: Left;   TOTAL ABDOMINAL HYSTERECTOMY       reports that she has  never smoked. She has never used smokeless tobacco. She reports current alcohol use. She reports that she does not use drugs.  Allergies  Allergen Reactions   Ace Inhibitors Hives and Itching   Streptomycin Other (See Comments) and Itching    Pruritus Pruritus    Tramadol Other (See Comments)    unknown   Vesicare [Solifenacin Succinate] Itching   Penicillins Rash    Pruritic rash Has patient had a PCN reaction causing immediate rash, facial/tongue/throat swelling, SOB or lightheadedness with hypotension: unknown Has patient had a PCN reaction causing severe rash involving mucus membranes or skin necrosis: unknown Has patient had a PCN reaction that required hospitalization:unknown Has patient had a PCN reaction occurring within the last 10 years: yes If all of the above answers are "NO", then may proceed with Cephalosporin use.     Family History  Problem Relation Age of Onset   Heart disease Father    Heart attack Father    Heart disease Mother    Heart attack Mother    Heart disease Daughter    Heart disease Son    Cancer Brother    Heart disease Sister    Heart attack Sister        X2   Heart attack Brother    Hypertension Brother    Stroke Neg Hx     Prior to Admission medications   Medication Sig Start Date End Date Taking? Authorizing Provider  acetaminophen (TYLENOL) 325 MG tablet Take 325-650 mg by mouth every 6 (six) hours as needed for mild pain.    [provider]  ANUCORT-HC 25 MG suppository Place 1 suppository rectally daily as needed for hemorrhoids. 02/07/20   [provider]  clotrimazole (LOTRIMIN) 1 % cream Apply 1 application topically 2 (two) times daily as needed (rash).    [provider]  feeding supplement (ENSURE ENLIVE / ENSURE PLUS) LIQD Take 237 mLs by mouth 2 (two) times daily between meals. 05/29/20   Mercy Riding, MD  furosemide (LASIX) 20 MG tablet Take 1 tablet (20 mg total) by mouth every Monday, Wednesday, and  Friday. Take 0.5 tablet by mouth Monday, Wednesday, Friday & Saturday 05/29/20   Mercy Riding, MD  isosorbide mononitrate (IMDUR) 30 MG 24 hr tablet Take 0.5 tablets (15 mg total) by mouth daily for 14 days. Patient taking differently: Take 30 mg by mouth daily. 03/15/20 03/29/20  Kayleen Memos, DO  metoprolol succinate (TOPROL-XL) 25 MG 24 hr tablet Take 0.5 tablets (12.5 mg total) by mouth daily for 14 days. Patient taking differently: Take 25 mg by mouth in the morning and at bedtime. 03/15/20 03/29/20  Kayleen Memos, DO  Multiple Vitamin (MULTIVITAMIN WITH MINERALS) TABS tablet Take 1 tablet by mouth daily. 05/30/20   Mercy Riding, MD  PARoxetine (PAXIL) 10 MG tablet Take 10 mg by mouth daily. 03/10/20   [provider]  polyethylene glycol (MIRALAX) packet Take 17 g by mouth daily as needed for mild constipation. 08/21/17   Jani Gravel,  MD  potassium chloride (KLOR-CON) 10 MEQ tablet Take 1 tablet (10 mEq total) by mouth every Monday, Wednesday, and Friday. 05/29/20   Mercy Riding, MD  Propylene Glycol (SYSTANE BALANCE) 0.6 % SOLN Place 1 drop into both eyes daily as needed (dry eyes).    [provider]  senna-docusate (SENOKOT-S) 8.6-50 MG tablet Take 1 tablet by mouth 2 (two) times daily as needed for mild constipation or moderate constipation. 05/29/20   Mercy Riding, MD    Physical Exam: Vitals:   11/21/21 1600 11/21/21 1815 11/21/21 1842 11/21/21 1900  BP:  135/82  (!) 156/72  Pulse:  79  64  Resp:  18  16  Temp:   98 F (36.7 C)   TempSrc:   Oral   SpO2:  94%  100%  Weight: 57.6 kg     Height: '5\' 6"'$  (1.676 m)       Constitutional: NAD, calm, comfortable.very pleasant Vitals:   11/21/21 1600 11/21/21 1815 11/21/21 1842 11/21/21 1900  BP:  135/82  (!) 156/72  Pulse:  79  64  Resp:  18  16  Temp:   98 F (36.7 C)   TempSrc:   Oral   SpO2:  94%  100%  Weight: 57.6 kg     Height: '5\' 6"'$  (1.676 m)      Eyes: PERRL, lids and conjunctivae normal ENMT: Mucous  membranes are moist. Posterior pharynx clear of any exudate or lesions.Normal dentition.  Neck: normal, supple, no masses, no thyromegaly Respiratory: clear to auscultation bilaterally, no wheezing, no crackles. Normal respiratory effort. No accessory muscle use.  Cardiovascular: Regular rate and rhythm, no murmurs / rubs / gallops. Trace extremity edema. Warm extremities. Abdomen: + suprapubic/RLQ tenderness, no masses palpated. No hepatosplenomegaly. Bowel sounds positive.  Musculoskeletal: no clubbing / cyanosis. No joint deformity upper and lower extremities. Good ROM, no contractures. Normal muscle tone.  Skin: + scalp hematoma, no rashes, lesions, ulcers. No induration Neurologic: CN 2-12 grossly intact. Sensation intact,Strength 3-4/5 in all lower extremities 5/5 in upper extremiteis Psychiatric: Normal judgment and insight. Alert and oriented to self Normal mood.    Labs on Admission: I have personally reviewed following labs and imaging studies  CBC: Recent Labs  Lab 11/21/21 1612 11/21/21 1621  WBC 6.7  --   NEUTROABS 3.8  --   HGB 11.1* 11.9*  HCT 35.3* 35.0*  MCV 94.4  --   PLT 191  --    Basic Metabolic Panel: Recent Labs  Lab 11/21/21 1612 11/21/21 1621  NA 143 143  K 3.7 3.8  CL 107 109  CO2 23  --   GLUCOSE 101* 98  BUN 11 14  CREATININE 0.98 0.90  CALCIUM 9.0  --    GFR: Estimated Creatinine Clearance: 28 mL/min (by C-G formula based on SCr of 0.9 mg/dL). Liver Function Tests: Recent Labs  Lab 11/21/21 1612  AST 17  ALT 7  ALKPHOS 66  BILITOT 0.1*  PROT 7.1  ALBUMIN 3.0*   Recent Labs  Lab 11/21/21 1612  LIPASE 32   No results for input(s): "AMMONIA" in the last 168 hours. Coagulation Profile: No results for input(s): "INR", "PROTIME" in the last 168 hours. Cardiac Enzymes: No results for input(s): "CKTOTAL", "CKMB", "CKMBINDEX", "TROPONINI" in the last 168 hours. BNP (last 3 results) No results for input(s): "PROBNP" in the last 8760  hours. HbA1C: No results for input(s): "HGBA1C" in the last 72 hours. CBG: No results for input(s): "GLUCAP" in the  last 168 hours. Lipid Profile: No results for input(s): "CHOL", "HDL", "LDLCALC", "TRIG", "CHOLHDL", "LDLDIRECT" in the last 72 hours. Thyroid Function Tests: No results for input(s): "TSH", "T4TOTAL", "FREET4", "T3FREE", "THYROIDAB" in the last 72 hours. Anemia Panel: No results for input(s): "VITAMINB12", "FOLATE", "FERRITIN", "TIBC", "IRON", "RETICCTPCT" in the last 72 hours. Urine analysis:    Component Value Date/Time   COLORURINE YELLOW 11/21/2021 2000   APPEARANCEUR HAZY (A) 11/21/2021 2000   LABSPEC 1.032 (H) 11/21/2021 2000   PHURINE 5.0 11/21/2021 2000   GLUCOSEU NEGATIVE 11/21/2021 2000   HGBUR SMALL (A) 11/21/2021 2000   BILIRUBINUR NEGATIVE 11/21/2021 2000   KETONESUR NEGATIVE 11/21/2021 2000   PROTEINUR NEGATIVE 11/21/2021 2000   UROBILINOGEN 0.2 09/18/2014 1600   NITRITE NEGATIVE 11/21/2021 2000   LEUKOCYTESUR TRACE (A) 11/21/2021 2000    Radiological Exams on Admission: CT L-SPINE NO CHARGE  Result Date: 11/21/2021 CLINICAL DATA:  Fall on blood thinners EXAM: CT Thoracic and Lumbar spine with contrast TECHNIQUE: Multiplanar CT images of the thoracic and lumbar spine were reconstructed from contemporary CT of the Chest, Abdomen, and Pelvis. RADIATION DOSE REDUCTION: This exam was performed according to the departmental dose-optimization program which includes automated exposure control, adjustment of the mA and/or kV according to patient size and/or use of iterative reconstruction technique. CONTRAST:  No additional contrast given, 75 mL Omnipaque 350 given for concurrent CT chest abdomen pelvis COMPARISON:  CT abdomen pelvis 05/22/2020, thoracic spine radiographs 09/05/2016 FINDINGS: CT THORACIC SPINE FINDINGS Alignment: S shaped curvature of the thoracolumbar spine. Preservation of the normal thoracic kyphosis. No listhesis. Vertebrae: Acute fracture  through the anterior superior endplate of K44 (series 5, image 44), involving the anterior osteophytes. Flowing osteophytes are noted T10-L2, with focal disruption at T 12. Osseous fusion of T12-L2. No definite retropulsion of the posterior cortex of T12, although there are large posterior osteophytes at T12-L1; these appear unchanged compared to 05/22/2020. No other acute fracture or suspicious osseous lesion. Paraspinal and other soft tissues: Please see same-day CT chest abdomen pelvis. Disc levels: Multilevel degenerative changes. Small disc osteophyte formations at T8-T9 and T9-T10. No high-grade spinal canal stenosis. Mild left neural foraminal narrowing at T9-T10 and T11-T12. CT LUMBAR SPINE FINDINGS Segmentation: 5 lumbar-type vertebral bodies. Alignment: S shaped curvature of the thoracolumbar spine, with dextrocurvature of the thoracolumbar junction and compensatory levocurvature of the lower lumbar spine. 3 mm retrolisthesis of L2 on L3. 6 mm retrolisthesis L3 on L4. 3 mm retrolisthesis L4 on L5. Vertebrae: As described above, there is an acute fracture through the anterosuperior aspect of T12, involving the large flowing anterior osteophytes, which extend to the level of L2. No additional fracture is seen, although evaluation is limited by degree of osteopenia. Osseous fusion of T12-L2. Additional acute fracture is seen. No suspicious osseous lesion. Paraspinal and other soft tissues: Please see same-day CT chest abdomen pelvis. Disc levels: T12-L1: Osseous fusion across the disc space with large circumferential osteophytes. Severe left and mild right facet arthropathy. Moderate to severe spinal canal stenosis. No neural foraminal narrowing. L1-L2: Osseous fusion across the disc space with circumferential osteophytes. Moderate facet arthropathy. Moderate spinal canal stenosis. Mild bilateral neural foraminal narrowing. L2-L3: Trace retrolisthesis and disc osteophyte complex. Moderate facet arthropathy.  Moderate spinal canal stenosis. Moderate left neural foraminal narrowing. L3-L4: Retrolisthesis. Small posterior disc osteophyte complex. Moderate facet arthropathy. No spinal canal stenosis. Moderate bilateral neural foraminal narrowing. L4-L5: Trace retrolisthesis and moderate disc bulge. Moderate to severe facet arthropathy. Mild spinal canal stenosis. Moderate bilateral  neural foraminal narrowing. L5-S1: Disc height loss and disc osteophyte complex. Moderate facet arthropathy. No spinal canal stenosis. Severe left and mild right neural foraminal narrowing. IMPRESSION: 1. Acute fracture through the anterior superior endplate of D32, involving the anterior osteophytes, without retropulsion. No additional fracture is seen in the thoracic or lumbar spine, although evaluation is limited by osteopenia. This could be further evaluated with MRI if clinically indicated. 2. Multilevel degenerative changes in the thoracic and lumbar spine, as described above. 3. For soft tissue findings, please see same-day CT chest abdomen pelvis. These results were called by telephone at the time of interpretation on 11/21/2021 at 7:16 pm to provider [facility] , who verbally acknowledged these results. Electronically Signed   By: Merilyn Baba M.D.   On: 11/21/2021 19:20   CT T-SPINE NO CHARGE  Result Date: 11/21/2021 CLINICAL DATA:  Fall on blood thinners EXAM: CT Thoracic and Lumbar spine with contrast TECHNIQUE: Multiplanar CT images of the thoracic and lumbar spine were reconstructed from contemporary CT of the Chest, Abdomen, and Pelvis. RADIATION DOSE REDUCTION: This exam was performed according to the departmental dose-optimization program which includes automated exposure control, adjustment of the mA and/or kV according to patient size and/or use of iterative reconstruction technique. CONTRAST:  No additional contrast given, 75 mL Omnipaque 350 given for concurrent CT chest abdomen pelvis COMPARISON:  CT abdomen  pelvis 05/22/2020, thoracic spine radiographs 09/05/2016 FINDINGS: CT THORACIC SPINE FINDINGS Alignment: S shaped curvature of the thoracolumbar spine. Preservation of the normal thoracic kyphosis. No listhesis. Vertebrae: Acute fracture through the anterior superior endplate of K02 (series 5, image 44), involving the anterior osteophytes. Flowing osteophytes are noted T10-L2, with focal disruption at T 12. Osseous fusion of T12-L2. No definite retropulsion of the posterior cortex of T12, although there are large posterior osteophytes at T12-L1; these appear unchanged compared to 05/22/2020. No other acute fracture or suspicious osseous lesion. Paraspinal and other soft tissues: Please see same-day CT chest abdomen pelvis. Disc levels: Multilevel degenerative changes. Small disc osteophyte formations at T8-T9 and T9-T10. No high-grade spinal canal stenosis. Mild left neural foraminal narrowing at T9-T10 and T11-T12. CT LUMBAR SPINE FINDINGS Segmentation: 5 lumbar-type vertebral bodies. Alignment: S shaped curvature of the thoracolumbar spine, with dextrocurvature of the thoracolumbar junction and compensatory levocurvature of the lower lumbar spine. 3 mm retrolisthesis of L2 on L3. 6 mm retrolisthesis L3 on L4. 3 mm retrolisthesis L4 on L5. Vertebrae: As described above, there is an acute fracture through the anterosuperior aspect of T12, involving the large flowing anterior osteophytes, which extend to the level of L2. No additional fracture is seen, although evaluation is limited by degree of osteopenia. Osseous fusion of T12-L2. Additional acute fracture is seen. No suspicious osseous lesion. Paraspinal and other soft tissues: Please see same-day CT chest abdomen pelvis. Disc levels: T12-L1: Osseous fusion across the disc space with large circumferential osteophytes. Severe left and mild right facet arthropathy. Moderate to severe spinal canal stenosis. No neural foraminal narrowing. L1-L2: Osseous fusion across  the disc space with circumferential osteophytes. Moderate facet arthropathy. Moderate spinal canal stenosis. Mild bilateral neural foraminal narrowing. L2-L3: Trace retrolisthesis and disc osteophyte complex. Moderate facet arthropathy. Moderate spinal canal stenosis. Moderate left neural foraminal narrowing. L3-L4: Retrolisthesis. Small posterior disc osteophyte complex. Moderate facet arthropathy. No spinal canal stenosis. Moderate bilateral neural foraminal narrowing. L4-L5: Trace retrolisthesis and moderate disc bulge. Moderate to severe facet arthropathy. Mild spinal canal stenosis. Moderate bilateral neural foraminal narrowing. L5-S1: Disc height loss and  disc osteophyte complex. Moderate facet arthropathy. No spinal canal stenosis. Severe left and mild right neural foraminal narrowing. IMPRESSION: 1. Acute fracture through the anterior superior endplate of W09, involving the anterior osteophytes, without retropulsion. No additional fracture is seen in the thoracic or lumbar spine, although evaluation is limited by osteopenia. This could be further evaluated with MRI if clinically indicated. 2. Multilevel degenerative changes in the thoracic and lumbar spine, as described above. 3. For soft tissue findings, please see same-day CT chest abdomen pelvis. These results were called by telephone at the time of interpretation on 11/21/2021 at 7:16 pm to provider [facility] , who verbally acknowledged these results. Electronically Signed   By: Merilyn Baba M.D.   On: 11/21/2021 19:20   CT CHEST ABDOMEN PELVIS W CONTRAST  Result Date: 11/21/2021 CLINICAL DATA:  Polytrauma, blunt.  Fall EXAM: CT CHEST, ABDOMEN, AND PELVIS WITH CONTRAST TECHNIQUE: Multidetector CT imaging of the chest, abdomen and pelvis was performed following the standard protocol during bolus administration of intravenous contrast. RADIATION DOSE REDUCTION: This exam was performed according to the departmental dose-optimization program  which includes automated exposure control, adjustment of the mA and/or kV according to patient size and/or use of iterative reconstruction technique. CONTRAST:  69m OMNIPAQUE IOHEXOL 350 MG/ML SOLN COMPARISON:  None Available. FINDINGS: CHEST: Cardiovascular: No aortic injury. The thoracic aorta is normal in caliber. The heart is normal in size. No significant pericardial effusion. Atherosclerotic plaque. Mediastinum/Nodes: No pneumomediastinum. No mediastinal hematoma. The esophagus is unremarkable. At least moderate volume hiatal hernia. The thyroid is unremarkable. The central airways are patent. No mediastinal, hilar, or axillary lymphadenopathy. Lungs/Pleura: Bilateral lower lobe subsegmental atelectasis. No focal consolidation. No pulmonary nodule. No pulmonary mass. No pulmonary contusion or laceration. No pneumatocele formation. No pleural effusion. No pneumothorax. No hemothorax. Musculoskeletal/Chest wall: No chest wall mass. No acute rib or sternal fracture. Please see separately dictated CT thoracolumbar spine 11/21/2021. Degenerative Changes of the partially visualized right upper extremity. ABDOMEN / PELVIS: Hepatobiliary: Not enlarged. No focal lesion. No laceration or subcapsular hematoma. The gallbladder is otherwise unremarkable with no radio-opaque gallstones. Intra and extrahepatic biliary ductal dilatation. Pancreas: Normal pancreatic contour. Main pancreatic duct dilatation measuring up to 6 mm. Spleen: Not enlarged. No focal lesion. No laceration, subcapsular hematoma, or vascular injury. Adrenals/Urinary Tract: No nodularity bilaterally. Bilateral kidneys enhance symmetrically. No hydronephrosis. No contusion, laceration, or subcapsular hematoma. Subcentimeter hypodensities are too small to characterize. No injury to the vascular structures or collecting systems. No hydroureter. The urinary bladder is unremarkable. Stomach/Bowel: No small or large bowel wall thickening or dilatation. The  appendix is not definitely identified with no inflammatory changes in the right lower quadrant to suggest acute appendicitis. Vasculature/Lymphatics: No abdominal aorta or iliac aneurysm. No active contrast extravasation or pseudoaneurysm. No abdominal, pelvic, inguinal lymphadenopathy. Reproductive: Status post hysterectomy.  No adnexal masses. Other: No simple free fluid ascites. No pneumoperitoneum. No hemoperitoneum. No mesenteric hematoma identified. No organized fluid collection. Musculoskeletal: No significant soft tissue hematoma. No acute pelvic fracture. Please see separately dictated CT thoracolumbar spine 11/21/2021. Ports and Devices: None. IMPRESSION: 1. No acute traumatic injury to the chest, abdomen, or pelvis. 2. Please see separately dictated CT thoracolumbar spine 11/21/2021. 3. Other imaging findings of potential clinical significance: At least moderate volume hiatal hernia. 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 Colonic diverticulosis with no acute diverticulitis. Electronically Signed   By: MIven FinnM.D.   On: 11/21/2021 18:15   CT Head Wo Contrast  Result Date: 11/21/2021 CLINICAL DATA:  Fall.  On blood thinner EXAM: CT HEAD WITHOUT CONTRAST CT CERVICAL SPINE WITHOUT CONTRAST TECHNIQUE: Multidetector CT imaging of the head and cervical spine was performed following the standard protocol without intravenous contrast. Multiplanar CT image reconstructions of the cervical spine were also generated. RADIATION DOSE REDUCTION: This exam was performed according to the departmental dose-optimization program which includes automated exposure control, adjustment of the mA and/or kV according to patient size and/or use of iterative reconstruction technique. COMPARISON:  CT head 08/11/2019 FINDINGS: CT HEAD FINDINGS Brain: Motion degraded study. Generalized atrophy. Chronic microvascular ischemic change in the white matter. Interval resolution of subdural hematoma on  the left Negative for acute infarct, hemorrhage, mass Vascular: Negative for hyperdense vessel Skull: Negative Sinuses/Orbits: Mild mucosal edema paranasal sinuses. Bilateral cataract extraction Other: None CT CERVICAL SPINE FINDINGS Alignment: Mild anterolisthesis C3-4 and C4-5 and C7-T1 Skull base and vertebrae: Negative for fracture. Cystic endplate changes at P5-9 and C6-7 as noted in 2018 CT. Soft tissues and spinal canal: Negative for soft tissue mass or swelling Disc levels: Advanced spondylosis. Multilevel disc and facet degeneration. Foraminal narrowing bilaterally at multiple levels due to spurring. Central canal patent. Upper chest: Lung apices clear bilaterally Other: None IMPRESSION: No acute intracranial abnormality. Atrophy and chronic microvascular ischemic change in the white matter. Cervical spondylosis. Negative for fracture. Electronically Signed   By: Franchot Gallo M.D.   On: 11/21/2021 18:08   CT Cervical Spine Wo Contrast  Result Date: 11/21/2021 CLINICAL DATA:  Fall.  On blood thinner EXAM: CT HEAD WITHOUT CONTRAST CT CERVICAL SPINE WITHOUT CONTRAST TECHNIQUE: Multidetector CT imaging of the head and cervical spine was performed following the standard protocol without intravenous contrast. Multiplanar CT image reconstructions of the cervical spine were also generated. RADIATION DOSE REDUCTION: This exam was performed according to the departmental dose-optimization program which includes automated exposure control, adjustment of the mA and/or kV according to patient size and/or use of iterative reconstruction technique. COMPARISON:  CT head 08/11/2019 FINDINGS: CT HEAD FINDINGS Brain: Motion degraded study. Generalized atrophy. Chronic microvascular ischemic change in the white matter. Interval resolution of subdural hematoma on the left Negative for acute infarct, hemorrhage, mass Vascular: Negative for hyperdense vessel Skull: Negative Sinuses/Orbits: Mild mucosal edema paranasal  sinuses. Bilateral cataract extraction Other: None CT CERVICAL SPINE FINDINGS Alignment: Mild anterolisthesis C3-4 and C4-5 and C7-T1 Skull base and vertebrae: Negative for fracture. Cystic endplate changes at F6-3 and C6-7 as noted in 2018 CT. Soft tissues and spinal canal: Negative for soft tissue mass or swelling Disc levels: Advanced spondylosis. Multilevel disc and facet degeneration. Foraminal narrowing bilaterally at multiple levels due to spurring. Central canal patent. Upper chest: Lung apices clear bilaterally Other: None IMPRESSION: No acute intracranial abnormality. Atrophy and chronic microvascular ischemic change in the white matter. Cervical spondylosis. Negative for fracture. Electronically Signed   By: Franchot Gallo M.D.   On: 11/21/2021 18:08   DG Pelvis Portable  Result Date: 11/21/2021 CLINICAL DATA:  Neck and back pain after fall EXAM: PORTABLE PELVIS 1-2 VIEWS COMPARISON:  07/23/2018 FINDINGS: Supine frontal view of the pelvis includes both hips. No acute displaced fracture, subluxation, or dislocation. There is severe bilateral hip osteoarthritis again noted. The sacroiliac joints are unremarkable. Soft tissues are normal. IMPRESSION: 1. Stable bilateral hip osteoarthritis.  No displaced fracture. Electronically Signed   By: Randa Ngo M.D.   On: 11/21/2021 16:21   DG Chest Portable 1 View  Result Date: 11/21/2021 CLINICAL DATA:  Neck and back pain after fall, diffuse abdominal pain EXAM: PORTABLE CHEST 1 VIEW COMPARISON:  04/14/2021 FINDINGS: Single frontal view of the chest demonstrates an unremarkable cardiac silhouette. Continued ectasia of the thoracic aorta. No acute airspace disease, effusion, or pneumothorax. Multilevel thoracolumbar spondylosis. No evidence of acute fracture. IMPRESSION: 1. No acute intrathoracic process. Electronically Signed   By: Randa Ngo M.D.   On: 11/21/2021 16:20    EKG: Independently reviewed. See above   Assessment/Plan  Recurrent  UTI -noted associated mild confusion and increase falls at home -place on CTX  -f/u on culture data  -previous UTI ,Ecoli    Fall with injury  T12 compression fracture  Scalp hematoma  Probable concussion -placed on TLSO  -PT/OT -pain controlled with tylenol currently -supportive care  -monitor on neuro checks  -Per POA no aggressive measures/surgery  Abdominal pain  -ct abd no acute finding  - possible related to uti/ vs msk s/p fall  -supportive care  -continue to monitor symptoms   Double duct sign/Pancreas  -? Possible lesion nos  - per POA no further work requested    Chronic combined CHFpef/PAH:  -resume GDMT once med rec completed    Essential hypertension:  -stable on current regimen  -resume as able   CKD-3A -stable at baseline   DVT prophylaxis: scd Code Status: DNR Family Communication:  Disposition Plan: patient  expected to be admitted greater than 2 midnights  Consults called: n/a Admission status: med tele   Clance Boll MD [facility]   If 7PM-7AM, please contact night-coverage [email or web address] Password [email or web address]  11/21/2021, 9:09 PM

## 2021-11-21 NOTE — ED Notes (Signed)
Trauma Response Nurse Documentation   [name] is a 88 y.o. female arriving to [facility] ED via EMS  On clopidogrel 75 mg daily. Trauma was activated as a Level 2 by ED Charge RN based on the following trauma criteria Elderly patients > 65 with head trauma on anti-coagulation (excluding ASA). Trauma team at the bedside on patient arrival.   Patient cleared for CT by Dr. Wyvonnia Dusky. Pt transported to CT with trauma response nurse present to monitor. RN remained with the patient throughout their absence from the department for clinical observation.   GCS 15.  History   Past Medical History:  Diagnosis Date   Allergic rhinitis    CAD (coronary artery disease)    CHF (congestive heart failure) (HCC)    CKD (chronic kidney disease) stage 3, GFR 30-59 ml/min (HCC) 09/12/2014   Diverticular disease    Diverticulosis    DJD (degenerative joint disease) of knee    DJD (degenerative joint disease) of lumbar spine    scoliosis   Eczema    Esophageal erosions    from nsaid    GI bleed    Hormone replacement therapy (postmenopausal)    Hyperglycemia    Hyperlipidemia    Hypertension    Pneumonia    july 2009   Pulmonary hypertension ([place])    a. 2D echo 03/2008 showed normal biventricular thickness, chamber size and systolic function, EF 44-01%, mild mitral regurgitation, aortic sclerosis, moderate pulmonary HTN, pulmonic regurgitation.   PVC's (premature ventricular contractions)    Reactive depression (situational)    Sciatica    right lower extremity   Shingles    right thoracic 2008   Vertigo      Past Surgical History:  Procedure Laterality Date   APPENDECTOMY     BACK SURGERY     BREAST LUMPECTOMY     FLEXIBLE SIGMOIDOSCOPY Left 08/13/2013   Procedure: FLEXIBLE SIGMOIDOSCOPY;  Surgeon: Arta Silence, MD;  Location: [facility];  Service: Endoscopy;  Laterality: Left;   TOTAL ABDOMINAL HYSTERECTOMY       Initial Focused Assessment (If applicable, or please see  trauma documentation): - GCS 15 - PERRLA 2's sluggish - c/o HA, neck pain, abd pain and mid back pain - Abrasion to thoracic back region - hematoma to posterior occiput region - Hard of hearing (hearing aids in place bilaterally)  CT's Completed:   CT Head, CT C-Spine, CT Chest w/ contrast, and CT abdomen/pelvis w/ contrast   Interventions:  - 20G PIV to L AC - c-collar applied - Trauma labs - CXR - Pelvic XR - logrolled pt - undressed pt and assessed body - Gave hearing aids and glasses to pt's niece, [name].  Plan for disposition:  Other Unsure at this time - probable discharge.  Consults completed:  none at 1735.  Event Summary: Pt was washing her hands when she fell backwards striking her mid back and the back of her head.  No LOC.  Pt lives with her niece, [name].  Bedside handoff with ED RN Sharion Dove  Trauma Response RN  Please call TRN at [phone] for further assistance.

## 2021-11-22 DIAGNOSIS — N1831 Chronic kidney disease, stage 3a: Secondary | ICD-10-CM | POA: Diagnosis not present

## 2021-11-22 DIAGNOSIS — I10 Essential (primary) hypertension: Secondary | ICD-10-CM

## 2021-11-22 DIAGNOSIS — W19XXXA Unspecified fall, initial encounter: Secondary | ICD-10-CM

## 2021-11-22 DIAGNOSIS — S22080A Wedge compression fracture of T11-T12 vertebra, initial encounter for closed fracture: Secondary | ICD-10-CM

## 2021-11-22 DIAGNOSIS — I509 Heart failure, unspecified: Secondary | ICD-10-CM

## 2021-11-22 DIAGNOSIS — I5042 Chronic combined systolic (congestive) and diastolic (congestive) heart failure: Secondary | ICD-10-CM | POA: Diagnosis not present

## 2021-11-22 DIAGNOSIS — Z66 Do not resuscitate: Secondary | ICD-10-CM | POA: Diagnosis not present

## 2021-11-22 DIAGNOSIS — R0609 Other forms of dyspnea: Secondary | ICD-10-CM

## 2021-11-22 LAB — COMPREHENSIVE METABOLIC PANEL
ALT: 8 U/L (ref 0–44)
AST: 23 U/L (ref 15–41)
Albumin: 3 g/dL — ABNORMAL LOW (ref 3.5–5.0)
Alkaline Phosphatase: 71 U/L (ref 38–126)
Anion gap: 9 (ref 5–15)
BUN: 9 mg/dL (ref 8–23)
CO2: 24 mmol/L (ref 22–32)
Calcium: 8.6 mg/dL — ABNORMAL LOW (ref 8.9–10.3)
Chloride: 108 mmol/L (ref 98–111)
Creatinine, Ser: 0.85 mg/dL (ref 0.44–1.00)
GFR, Estimated: 60 mL/min — ABNORMAL LOW (ref >60)
Glucose, Bld: 100 mg/dL — ABNORMAL HIGH (ref 70–99)
Potassium: 3.1 mmol/L — ABNORMAL LOW (ref 3.5–5.1)
Sodium: 141 mmol/L (ref 135–145)
Total Bilirubin: 0.4 mg/dL (ref 0.3–1.2)
Total Protein: 7.2 g/dL (ref 6.5–8.1)

## 2021-11-22 LAB — CBC
HCT: 38.1 % (ref 36.0–46.0)
Hemoglobin: 12.1 g/dL (ref 12.0–15.0)
MCH: 29.8 pg (ref 26.0–34.0)
MCHC: 31.8 g/dL (ref 30.0–36.0)
MCV: 93.8 fL (ref 80.0–100.0)
Platelets: 198 10*3/uL (ref 150–400)
RBC: 4.06 MIL/uL (ref 3.87–5.11)
RDW: 14.7 % (ref 11.5–15.5)
WBC: 9.7 10*3/uL (ref 4.0–10.5)
nRBC: 0 % (ref 0.0–0.2)

## 2021-11-22 MED ORDER — PANTOPRAZOLE SODIUM 40 MG PO TBEC
40.0000 mg | DELAYED_RELEASE_TABLET | ORAL | Status: DC
  Administered 2021-11-23: 40 mg via ORAL
  Filled 2021-11-22: qty 1

## 2021-11-22 MED ORDER — PROPYLENE GLYCOL 0.6 % OP SOLN: 1.0000 [drp] | Freq: Every day | OPHTHALMIC | Status: DC | PRN

## 2021-11-22 MED ORDER — METOPROLOL SUCCINATE ER 25 MG PO TB24
12.5000 mg | ORAL_TABLET | Freq: Every day | ORAL | Status: DC
  Administered 2021-11-23 – 2021-11-25 (×3): 12.5 mg via ORAL
  Filled 2021-11-22 (×4): qty 1

## 2021-11-22 MED ORDER — ESTROGENS CONJUGATED 0.3 MG PO TABS
0.3000 mg | ORAL_TABLET | Freq: Every day | ORAL | Status: DC
  Administered 2021-11-23 – 2021-11-25 (×3): 0.3 mg via ORAL
  Filled 2021-11-22 (×4): qty 1

## 2021-11-22 MED ORDER — SODIUM CHLORIDE 0.9 % IV SOLN
1.0000 g | INTRAVENOUS | Status: DC
  Administered 2021-11-22 – 2021-11-23 (×2): 1 g via INTRAVENOUS
  Filled 2021-11-22 (×2): qty 10

## 2021-11-22 MED ORDER — POLYVINYL ALCOHOL 1.4 % OP SOLN: 1.0000 [drp] | OPHTHALMIC | Status: DC | PRN

## 2021-11-22 MED ORDER — ISOSORBIDE MONONITRATE ER 30 MG PO TB24
15.0000 mg | ORAL_TABLET | Freq: Every day | ORAL | Status: DC
  Administered 2021-11-23 – 2021-11-25 (×3): 15 mg via ORAL
  Filled 2021-11-22 (×5): qty 1

## 2021-11-22 MED ORDER — TRAZODONE HCL 50 MG PO TABS
25.0000 mg | ORAL_TABLET | Freq: Every evening | ORAL | Status: DC | PRN
  Administered 2021-11-23: 25 mg via ORAL
  Filled 2021-11-22: qty 1

## 2021-11-22 MED ORDER — POTASSIUM CHLORIDE CRYS ER 20 MEQ PO TBCR
40.0000 meq | EXTENDED_RELEASE_TABLET | Freq: Once | ORAL | Status: AC
  Administered 2021-11-22: 40 meq via ORAL
  Filled 2021-11-22: qty 2

## 2021-11-22 MED ORDER — AMLODIPINE BESYLATE 2.5 MG PO TABS
2.5000 mg | ORAL_TABLET | Freq: Every day | ORAL | Status: DC
  Administered 2021-11-23 – 2021-11-25 (×3): 2.5 mg via ORAL
  Filled 2021-11-22 (×4): qty 1

## 2021-11-22 MED ORDER — ACETAMINOPHEN 325 MG PO TABS: 325.0000 mg | ORAL_TABLET | Freq: Four times a day (QID) | ORAL | Status: DC | PRN

## 2021-11-22 MED ORDER — PRENATAL MULTIVITAMIN CH
1.0000 | ORAL_TABLET | Freq: Every day | ORAL | Status: DC
  Administered 2021-11-23 – 2021-11-25 (×3): 1 via ORAL
  Filled 2021-11-22 (×5): qty 1

## 2021-11-22 MED ORDER — POTASSIUM CHLORIDE CRYS ER 20 MEQ PO TBCR
40.0000 meq | EXTENDED_RELEASE_TABLET | Freq: Every day | ORAL | Status: DC
  Administered 2021-11-23: 40 meq via ORAL
  Filled 2021-11-22 (×2): qty 2

## 2021-11-22 MED ORDER — POLYETHYLENE GLYCOL 3350 17 G PO PACK: 17.0000 g | PACK | Freq: Every day | ORAL | Status: DC | PRN

## 2021-11-22 MED ORDER — MECLIZINE HCL 25 MG PO TABS: 25.0000 mg | ORAL_TABLET | Freq: Two times a day (BID) | ORAL | Status: DC | PRN

## 2021-11-22 MED ORDER — LOPERAMIDE HCL 2 MG PO CAPS
2.0000 mg | ORAL_CAPSULE | ORAL | Status: DC
  Administered 2021-11-23: 2 mg via ORAL
  Filled 2021-11-22: qty 1

## 2021-11-22 MED ORDER — PAROXETINE HCL 10 MG PO TABS
10.0000 mg | ORAL_TABLET | Freq: Every day | ORAL | Status: DC
  Administered 2021-11-23 – 2021-11-25 (×3): 10 mg via ORAL
  Filled 2021-11-22 (×4): qty 1

## 2021-11-22 NOTE — Progress Notes (Addendum)
PROGRESS NOTE    [name]  FOY:[id number] DOB: 08-Dec-1916 DOA: 11/21/2021 PCP: Seward Carol, MD    Brief Narrative:  [name] is a 88 y.o. female with past medical history of CHF, PAH, coronary artery disease, CKD stage III, prior GI bleed, diverticulosis, hypertension and hyperlipidemia, history of COVID-19 infection, recurrent UTI presented to hospital for frequent falls and confusion.  She had a fall with trauma to her head as well.  In the ED, initial vitals were stable.  CBC without leukocytosis hemoglobin 11.1.  Sodium 143 with potassium of 3.8.  Creatinine was 0.9.  Urinalysis showed 6-10 white cells with trace leukocytes.  Urine culture was sent from the ED.  CT head without any acute findings.  CT of the lumbar spine showed fracture through the superior endplate of V67 with multilevel degenerative changes.  Patient was then admitted hospital for further evaluation and treatment.  Assessment and plan. Principal Problem:   Falls Active Problems:   Essential hypertension, benign   DOE (dyspnea on exertion)   CKD (chronic kidney disease) stage 3, GFR 30-59 ml/min (HCC)   DNR (do not resuscitate)   UTI (urinary tract infection)   T12 compression fracture (HCC)   Chronic CHF ([place])   Fall with injury  T12 compression fracture  Scalp hematoma  Probable concussion. Patient is currently on TSLO brace and ambulating.Marland Kitchen  No pain while resting.  Occupational therapy has seen the patient and recommended skilled nursing facility placement.  Check PT.Marland Kitchen  Analgesia.  Recurrent UTI Had increased confusion and falls at home.  On IV Rocephin at this time.  Follow urine culture.  Patient had previous UTI with E. coli.  Mild hypokalemia.  Potassium of 3.1.  We will replace orally.  Check BMP in AM.   Abdominal pain  CT scan of the abdomen pelvis without any acute findings.On Rocephin.  Denies active pain at this time.  Double duct sign/Pancreas  Possible underlying  lesion.Marland Kitchen  POA does not wish for further work-up.  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  On Lasix, amlodipine, metoprolol at home.  Hold off with Lasix today.   Essential hypertension:  On metoprolol and amlodipine at home.  Will resume.  Latest blood pressure of 150/74   CKD stage IIIa. We will continue to monitor creatinine.  Creatinine at baseline at 0.85.   Deconditioning debility, recurrent falls.  Patient has been seen by Occupational Therapy who recommended skilled nursing facility placement.  PT recommendations.    DVT prophylaxis: SCDs Start: 11/21/21 2253   Code Status:     Code Status: DNR  Disposition: Skilled nursing facility as per OT recommendation  Status is: Inpatient Remains inpatient appropriate because: Falls, electrolyte imbalance, possible need for rehabilitation at a skilled nursing facility   Family Communication: None at bedside  Consultants:  None  Procedures:  None  Antimicrobials:  Rocephin IV  Anti-infectives (From admission, onward)    Start     Dose/Rate Route Frequency Ordered Stop   11/21/21 2045  cefTRIAXone (ROCEPHIN) 1 g in sodium chloride 0.9 % 100 mL IVPB        1 g 200 mL/hr over 30 Minutes Intravenous  Once 11/21/21 2037 11/22/21 0131        Subjective: Today, patient was seen and examined at bedside.  Patient complains of generalized weakness.  Hard of hearing.  Denies any nausea vomiting.  Complains of mild pain in the back while movement.  Objective: Vitals:   11/22/21 0629 11/22/21  0700 11/22/21 0715 11/22/21 0730  BP:  (!) 141/90 (!) 150/62 127/63  Pulse:  65 66 64  Resp:  '12 18 14  '$ Temp: 97.8 F (36.6 C)   98.2 F (36.8 C)  TempSrc:      SpO2:  99% 100% 100%  Weight:      Height:        Intake/Output Summary (Last 24 hours) at 11/22/2021 1129 Last data filed at 11/22/2021 0328 Gross per 24 hour  Intake 0 ml  Output 100 ml  Net -100 ml   Filed Weights   11/21/21 1600  Weight: 57.6 kg     Physical Examination: Body mass index is 20.5 kg/m.  General:  , not in obvious distress hard of hearing, thinly built, elderly female HENT:   No scleral pallor or icterus noted. Oral mucosa is moist.  Chest:  Clear breath sounds.  Diminished breath sounds bilaterally. No crackles or wheezes.  Tenderness over the upper back on palpation. CVS: S1 &S2 heard. No murmur.  Regular rate and rhythm. Abdomen: Soft, nontender, nondistended.  Bowel sounds are heard.   Extremities: No cyanosis, clubbing or edema.  Peripheral pulses are palpable. Psych: Alert, awake and Communicative,  CNS:  No cranial nerve deficits.  Moves extremities, generalized weakness Skin: Warm and dry.  No rashes noted.  Data Reviewed:   CBC: Recent Labs  Lab 11/21/21 1612 11/21/21 1621 11/22/21 0420  WBC 6.7  --  9.7  NEUTROABS 3.8  --   --   HGB 11.1* 11.9* 12.1  HCT 35.3* 35.0* 38.1  MCV 94.4  --  93.8  PLT 191  --  784    Basic Metabolic Panel: Recent Labs  Lab 11/21/21 1612 11/21/21 1621 11/22/21 0420  NA 143 143 141  K 3.7 3.8 3.1*  CL 107 109 108  CO2 23  --  24  GLUCOSE 101* 98 100*  BUN '11 14 9  '$ CREATININE 0.98 0.90 0.85  CALCIUM 9.0  --  8.6*    Liver Function Tests: Recent Labs  Lab 11/21/21 1612 11/22/21 0420  AST 17 23  ALT 7 8  ALKPHOS 66 71  BILITOT 0.1* 0.4  PROT 7.1 7.2  ALBUMIN 3.0* 3.0*     Radiology Studies: CT L-SPINE NO CHARGE  Result Date: 11/21/2021 CLINICAL DATA:  Fall on blood thinners EXAM: CT Thoracic and Lumbar spine with contrast TECHNIQUE: Multiplanar CT images of the thoracic and lumbar spine were reconstructed from contemporary CT of the Chest, Abdomen, and Pelvis. RADIATION DOSE REDUCTION: This exam was performed according to the departmental dose-optimization program which includes automated exposure control, adjustment of the mA and/or kV according to patient size and/or use of iterative reconstruction technique. CONTRAST:  No additional contrast  given, 75 mL Omnipaque 350 given for concurrent CT chest abdomen pelvis COMPARISON:  CT abdomen pelvis 05/22/2020, thoracic spine radiographs 09/05/2016 FINDINGS: CT THORACIC SPINE FINDINGS Alignment: S shaped curvature of the thoracolumbar spine. Preservation of the normal thoracic kyphosis. No listhesis. Vertebrae: Acute fracture through the anterior superior endplate of O96 (series 5, image 44), involving the anterior osteophytes. Flowing osteophytes are noted T10-L2, with focal disruption at T 12. Osseous fusion of T12-L2. No definite retropulsion of the posterior cortex of T12, although there are large posterior osteophytes at T12-L1; these appear unchanged compared to 05/22/2020. No other acute fracture or suspicious osseous lesion. Paraspinal and other soft tissues: Please see same-day CT chest abdomen pelvis. Disc levels: Multilevel degenerative changes. Small disc osteophyte formations at  T8-T9 and T9-T10. No high-grade spinal canal stenosis. Mild left neural foraminal narrowing at T9-T10 and T11-T12. CT LUMBAR SPINE FINDINGS Segmentation: 5 lumbar-type vertebral bodies. Alignment: S shaped curvature of the thoracolumbar spine, with dextrocurvature of the thoracolumbar junction and compensatory levocurvature of the lower lumbar spine. 3 mm retrolisthesis of L2 on L3. 6 mm retrolisthesis L3 on L4. 3 mm retrolisthesis L4 on L5. Vertebrae: As described above, there is an acute fracture through the anterosuperior aspect of T12, involving the large flowing anterior osteophytes, which extend to the level of L2. No additional fracture is seen, although evaluation is limited by degree of osteopenia. Osseous fusion of T12-L2. Additional acute fracture is seen. No suspicious osseous lesion. Paraspinal and other soft tissues: Please see same-day CT chest abdomen pelvis. Disc levels: T12-L1: Osseous fusion across the disc space with large circumferential osteophytes. Severe left and mild right facet arthropathy.  Moderate to severe spinal canal stenosis. No neural foraminal narrowing. L1-L2: Osseous fusion across the disc space with circumferential osteophytes. Moderate facet arthropathy. Moderate spinal canal stenosis. Mild bilateral neural foraminal narrowing. L2-L3: Trace retrolisthesis and disc osteophyte complex. Moderate facet arthropathy. Moderate spinal canal stenosis. Moderate left neural foraminal narrowing. L3-L4: Retrolisthesis. Small posterior disc osteophyte complex. Moderate facet arthropathy. No spinal canal stenosis. Moderate bilateral neural foraminal narrowing. L4-L5: Trace retrolisthesis and moderate disc bulge. Moderate to severe facet arthropathy. Mild spinal canal stenosis. Moderate bilateral neural foraminal narrowing. L5-S1: Disc height loss and disc osteophyte complex. Moderate facet arthropathy. No spinal canal stenosis. Severe left and mild right neural foraminal narrowing. IMPRESSION: 1. Acute fracture through the anterior superior endplate of R44, involving the anterior osteophytes, without retropulsion. No additional fracture is seen in the thoracic or lumbar spine, although evaluation is limited by osteopenia. This could be further evaluated with MRI if clinically indicated. 2. Multilevel degenerative changes in the thoracic and lumbar spine, as described above. 3. For soft tissue findings, please see same-day CT chest abdomen pelvis. These results were called by telephone at the time of interpretation on 11/21/2021 at 7:16 pm to provider [facility] , who verbally acknowledged these results. Electronically Signed   By: Merilyn Baba M.D.   On: 11/21/2021 19:20   CT T-SPINE NO CHARGE  Result Date: 11/21/2021 CLINICAL DATA:  Fall on blood thinners EXAM: CT Thoracic and Lumbar spine with contrast TECHNIQUE: Multiplanar CT images of the thoracic and lumbar spine were reconstructed from contemporary CT of the Chest, Abdomen, and Pelvis. RADIATION DOSE REDUCTION: This exam was performed  according to the departmental dose-optimization program which includes automated exposure control, adjustment of the mA and/or kV according to patient size and/or use of iterative reconstruction technique. CONTRAST:  No additional contrast given, 75 mL Omnipaque 350 given for concurrent CT chest abdomen pelvis COMPARISON:  CT abdomen pelvis 05/22/2020, thoracic spine radiographs 09/05/2016 FINDINGS: CT THORACIC SPINE FINDINGS Alignment: S shaped curvature of the thoracolumbar spine. Preservation of the normal thoracic kyphosis. No listhesis. Vertebrae: Acute fracture through the anterior superior endplate of R15 (series 5, image 44), involving the anterior osteophytes. Flowing osteophytes are noted T10-L2, with focal disruption at T 12. Osseous fusion of T12-L2. No definite retropulsion of the posterior cortex of T12, although there are large posterior osteophytes at T12-L1; these appear unchanged compared to 05/22/2020. No other acute fracture or suspicious osseous lesion. Paraspinal and other soft tissues: Please see same-day CT chest abdomen pelvis. Disc levels: Multilevel degenerative changes. Small disc osteophyte formations at T8-T9 and T9-T10. No high-grade spinal canal stenosis.  Mild left neural foraminal narrowing at T9-T10 and T11-T12. CT LUMBAR SPINE FINDINGS Segmentation: 5 lumbar-type vertebral bodies. Alignment: S shaped curvature of the thoracolumbar spine, with dextrocurvature of the thoracolumbar junction and compensatory levocurvature of the lower lumbar spine. 3 mm retrolisthesis of L2 on L3. 6 mm retrolisthesis L3 on L4. 3 mm retrolisthesis L4 on L5. Vertebrae: As described above, there is an acute fracture through the anterosuperior aspect of T12, involving the large flowing anterior osteophytes, which extend to the level of L2. No additional fracture is seen, although evaluation is limited by degree of osteopenia. Osseous fusion of T12-L2. Additional acute fracture is seen. No suspicious  osseous lesion. Paraspinal and other soft tissues: Please see same-day CT chest abdomen pelvis. Disc levels: T12-L1: Osseous fusion across the disc space with large circumferential osteophytes. Severe left and mild right facet arthropathy. Moderate to severe spinal canal stenosis. No neural foraminal narrowing. L1-L2: Osseous fusion across the disc space with circumferential osteophytes. Moderate facet arthropathy. Moderate spinal canal stenosis. Mild bilateral neural foraminal narrowing. L2-L3: Trace retrolisthesis and disc osteophyte complex. Moderate facet arthropathy. Moderate spinal canal stenosis. Moderate left neural foraminal narrowing. L3-L4: Retrolisthesis. Small posterior disc osteophyte complex. Moderate facet arthropathy. No spinal canal stenosis. Moderate bilateral neural foraminal narrowing. L4-L5: Trace retrolisthesis and moderate disc bulge. Moderate to severe facet arthropathy. Mild spinal canal stenosis. Moderate bilateral neural foraminal narrowing. L5-S1: Disc height loss and disc osteophyte complex. Moderate facet arthropathy. No spinal canal stenosis. Severe left and mild right neural foraminal narrowing. IMPRESSION: 1. Acute fracture through the anterior superior endplate of Z61, involving the anterior osteophytes, without retropulsion. No additional fracture is seen in the thoracic or lumbar spine, although evaluation is limited by osteopenia. This could be further evaluated with MRI if clinically indicated. 2. Multilevel degenerative changes in the thoracic and lumbar spine, as described above. 3. For soft tissue findings, please see same-day CT chest abdomen pelvis. These results were called by telephone at the time of interpretation on 11/21/2021 at 7:16 pm to provider [facility] , who verbally acknowledged these results. Electronically Signed   By: Merilyn Baba M.D.   On: 11/21/2021 19:20   CT CHEST ABDOMEN PELVIS W CONTRAST  Result Date: 11/21/2021 CLINICAL DATA:  Polytrauma,  blunt.  Fall EXAM: CT CHEST, ABDOMEN, AND PELVIS WITH CONTRAST TECHNIQUE: Multidetector CT imaging of the chest, abdomen and pelvis was performed following the standard protocol during bolus administration of intravenous contrast. RADIATION DOSE REDUCTION: This exam was performed according to the departmental dose-optimization program which includes automated exposure control, adjustment of the mA and/or kV according to patient size and/or use of iterative reconstruction technique. CONTRAST:  40m OMNIPAQUE IOHEXOL 350 MG/ML SOLN COMPARISON:  None Available. FINDINGS: CHEST: Cardiovascular: No aortic injury. The thoracic aorta is normal in caliber. The heart is normal in size. No significant pericardial effusion. Atherosclerotic plaque. Mediastinum/Nodes: No pneumomediastinum. No mediastinal hematoma. The esophagus is unremarkable. At least moderate volume hiatal hernia. The thyroid is unremarkable. The central airways are patent. No mediastinal, hilar, or axillary lymphadenopathy. Lungs/Pleura: Bilateral lower lobe subsegmental atelectasis. No focal consolidation. No pulmonary nodule. No pulmonary mass. No pulmonary contusion or laceration. No pneumatocele formation. No pleural effusion. No pneumothorax. No hemothorax. Musculoskeletal/Chest wall: No chest wall mass. No acute rib or sternal fracture. Please see separately dictated CT thoracolumbar spine 11/21/2021. Degenerative Changes of the partially visualized right upper extremity. ABDOMEN / PELVIS: Hepatobiliary: Not enlarged. No focal lesion. No laceration or subcapsular hematoma. The gallbladder is otherwise unremarkable with  no radio-opaque gallstones. Intra and extrahepatic biliary ductal dilatation. Pancreas: Normal pancreatic contour. Main pancreatic duct dilatation measuring up to 6 mm. Spleen: Not enlarged. No focal lesion. No laceration, subcapsular hematoma, or vascular injury. Adrenals/Urinary Tract: No nodularity bilaterally. Bilateral kidneys  enhance symmetrically. No hydronephrosis. No contusion, laceration, or subcapsular hematoma. Subcentimeter hypodensities are too small to characterize. No injury to the vascular structures or collecting systems. No hydroureter. The urinary bladder is unremarkable. Stomach/Bowel: No small or large bowel wall thickening or dilatation. The appendix is not definitely identified with no inflammatory changes in the right lower quadrant to suggest acute appendicitis. Vasculature/Lymphatics: No abdominal aorta or iliac aneurysm. No active contrast extravasation or pseudoaneurysm. No abdominal, pelvic, inguinal lymphadenopathy. Reproductive: Status post hysterectomy.  No adnexal masses. Other: No simple free fluid ascites. No pneumoperitoneum. No hemoperitoneum. No mesenteric hematoma identified. No organized fluid collection. Musculoskeletal: No significant soft tissue hematoma. No acute pelvic fracture. Please see separately dictated CT thoracolumbar spine 11/21/2021. Ports and Devices: None. IMPRESSION: 1. No acute traumatic injury to the chest, abdomen, or pelvis. 2. Please see separately dictated CT thoracolumbar spine 11/21/2021. 3. Other imaging findings of potential clinical significance: At least moderate volume hiatal hernia. 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 Colonic diverticulosis with no acute diverticulitis. Electronically Signed   By: Iven Finn M.D.   On: 11/21/2021 18:15   CT Head Wo Contrast  Result Date: 11/21/2021 CLINICAL DATA:  Fall.  On blood thinner EXAM: CT HEAD WITHOUT CONTRAST CT CERVICAL SPINE WITHOUT CONTRAST TECHNIQUE: Multidetector CT imaging of the head and cervical spine was performed following the standard protocol without intravenous contrast. Multiplanar CT image reconstructions of the cervical spine were also generated. RADIATION DOSE REDUCTION: This exam was performed according to the departmental dose-optimization program which includes  automated exposure control, adjustment of the mA and/or kV according to patient size and/or use of iterative reconstruction technique. COMPARISON:  CT head 08/11/2019 FINDINGS: CT HEAD FINDINGS Brain: Motion degraded study. Generalized atrophy. Chronic microvascular ischemic change in the white matter. Interval resolution of subdural hematoma on the left Negative for acute infarct, hemorrhage, mass Vascular: Negative for hyperdense vessel Skull: Negative Sinuses/Orbits: Mild mucosal edema paranasal sinuses. Bilateral cataract extraction Other: None CT CERVICAL SPINE FINDINGS Alignment: Mild anterolisthesis C3-4 and C4-5 and C7-T1 Skull base and vertebrae: Negative for fracture. Cystic endplate changes at G2-5 and C6-7 as noted in 2018 CT. Soft tissues and spinal canal: Negative for soft tissue mass or swelling Disc levels: Advanced spondylosis. Multilevel disc and facet degeneration. Foraminal narrowing bilaterally at multiple levels due to spurring. Central canal patent. Upper chest: Lung apices clear bilaterally Other: None IMPRESSION: No acute intracranial abnormality. Atrophy and chronic microvascular ischemic change in the white matter. Cervical spondylosis. Negative for fracture. Electronically Signed   By: Franchot Gallo M.D.   On: 11/21/2021 18:08   CT Cervical Spine Wo Contrast  Result Date: 11/21/2021 CLINICAL DATA:  Fall.  On blood thinner EXAM: CT HEAD WITHOUT CONTRAST CT CERVICAL SPINE WITHOUT CONTRAST TECHNIQUE: Multidetector CT imaging of the head and cervical spine was performed following the standard protocol without intravenous contrast. Multiplanar CT image reconstructions of the cervical spine were also generated. RADIATION DOSE REDUCTION: This exam was performed according to the departmental dose-optimization program which includes automated exposure control, adjustment of the mA and/or kV according to patient size and/or use of iterative reconstruction technique. COMPARISON:  CT head  08/11/2019 FINDINGS: CT HEAD FINDINGS Brain: Motion degraded study. Generalized atrophy. Chronic  microvascular ischemic change in the white matter. Interval resolution of subdural hematoma on the left Negative for acute infarct, hemorrhage, mass Vascular: Negative for hyperdense vessel Skull: Negative Sinuses/Orbits: Mild mucosal edema paranasal sinuses. Bilateral cataract extraction Other: None CT CERVICAL SPINE FINDINGS Alignment: Mild anterolisthesis C3-4 and C4-5 and C7-T1 Skull base and vertebrae: Negative for fracture. Cystic endplate changes at O8-4 and C6-7 as noted in 2018 CT. Soft tissues and spinal canal: Negative for soft tissue mass or swelling Disc levels: Advanced spondylosis. Multilevel disc and facet degeneration. Foraminal narrowing bilaterally at multiple levels due to spurring. Central canal patent. Upper chest: Lung apices clear bilaterally Other: None IMPRESSION: No acute intracranial abnormality. Atrophy and chronic microvascular ischemic change in the white matter. Cervical spondylosis. Negative for fracture. Electronically Signed   By: Franchot Gallo M.D.   On: 11/21/2021 18:08   DG Pelvis Portable  Result Date: 11/21/2021 CLINICAL DATA:  Neck and back pain after fall EXAM: PORTABLE PELVIS 1-2 VIEWS COMPARISON:  07/23/2018 FINDINGS: Supine frontal view of the pelvis includes both hips. No acute displaced fracture, subluxation, or dislocation. There is severe bilateral hip osteoarthritis again noted. The sacroiliac joints are unremarkable. Soft tissues are normal. IMPRESSION: 1. Stable bilateral hip osteoarthritis.  No displaced fracture. Electronically Signed   By: Randa Ngo M.D.   On: 11/21/2021 16:21   DG Chest Portable 1 View  Result Date: 11/21/2021 CLINICAL DATA:  Neck and back pain after fall, diffuse abdominal pain EXAM: PORTABLE CHEST 1 VIEW COMPARISON:  04/14/2021 FINDINGS: Single frontal view of the chest demonstrates an unremarkable cardiac silhouette. Continued  ectasia of the thoracic aorta. No acute airspace disease, effusion, or pneumothorax. Multilevel thoracolumbar spondylosis. No evidence of acute fracture. IMPRESSION: 1. No acute intrathoracic process. Electronically Signed   By: Randa Ngo M.D.   On: 11/21/2021 16:20      LOS: 1 day    Flora Lipps, MD [facility] Available via Epic secure chat 7am-7pm After these hours, please refer to coverage provider listed on [email or web address] 11/22/2021, 11:29 AM

## 2021-11-22 NOTE — ED Notes (Signed)
Patient unable to stay awake to take medications. Medications held at this time

## 2021-11-22 NOTE — Progress Notes (Signed)
CSW submitted inquiry to determine if patient is eligible for the [place] waiver and she is eligible.  Madilyn Fireman, MSW, LCSW Transitions of Care  Clinical Social Worker II [phone]

## 2021-11-22 NOTE — Progress Notes (Signed)
Orthopedic Tech Progress Note Patient Details:  [name] [birth year] [id number]  Ortho Devices Type of Ortho Device: Thoracolumbar corset (TLSO) Ortho Device/Splint Interventions: Ordered, Application, Adjustment   Post Interventions Patient Tolerated: Well Instructions Provided: Adjustment of device Received a call from RN regarding the patient and TLSO, as it had shifted. Upon my arrival the patient had removed her brace and was very agitated with it, I readjusted the brace and instructed the patient on the importance of wearing it. Vernona Rieger 11/22/2021, 12:37 AM

## 2021-11-22 NOTE — Progress Notes (Signed)
Occupational Therapy Evaluation Patient Details Name: [name] MRN: [id number] DOB: 31-Jan-1916 Today's Date: 11/22/2021   History of Present Illness 88 yo female presents to [facility] s/p fall + head trauma. Cspine cleared, CTH negative, CT abd/pelvis negative for acute findings. CT lumbar spine shows acute fx through anterior aspect of superior endplate of F68, no retropulsion. UA + UTI. PMH includes CAD, HF, CKD III, diverticulosis, DJD, GIB, HTN, HLD.   Clinical Impression   PTA, pt from ALF. With prior hospitalization, pt previously able to mobilize with Rollator and receive light assistance with ADLs. Pt unable to confirm current level of function due to AMS. Pt limited by pain so unable to progress OOB today. Pt able to demo rolling side to side with Min-Mod A for ADL assist, skin check and to remove TLSO brace. Due to pt skin integrity concerns, confirmed with MD ok to update TLSO order to OOB only. Pt requires Max A -Total A for UB/LB ADLs bed level at this time. As pt at increased risk for future falls, recommend SNF rehab prior to return to ALF. Will continue to follow acutely.     Recommendations for follow up therapy are one component of a multi-disciplinary discharge planning process, led by the attending physician.  Recommendations may be updated based on patient status, additional functional criteria and insurance authorization.   Follow Up Recommendations  Skilled nursing-short term rehab (<3 hours/day)    Assistance Recommended at Discharge Frequent or constant Supervision/Assistance  Patient can return home with the following A lot of help with walking and/or transfers;A lot of help with bathing/dressing/bathroom    Functional Status Assessment  Patient has had a recent decline in their functional status and demonstrates the ability to make significant improvements in function in a reasonable and predictable amount of time.  Equipment Recommendations  Other (comment)  (TBD pending progress)    Recommendations for Other Services       Precautions / Restrictions Precautions Precautions: Fall;Back Precaution Booklet Issued: No Required Braces or Orthoses: Spinal Brace Spinal Brace: Thoracolumbosacral orthotic Restrictions Weight Bearing Restrictions: No Other Position/Activity Restrictions: Confirmed with MD 10/26 permission to update TLSO order to OOB only to preserve skin integrity      Mobility Bed Mobility Overal bed mobility: Needs Assistance Bed Mobility: Rolling Rolling: Mod assist         General bed mobility comments: Min-Mod A to roll side to side for brief change, skin check and TLSO brace removal    Transfers                          Balance                                           ADL either performed or assessed with clinical judgement   ADL Overall ADL's : Needs assistance/impaired Eating/Feeding: Set up;Bed level   Grooming: Minimal assistance;Bed level   Upper Body Bathing: Moderate assistance;Bed level   Lower Body Bathing: Total assistance;Bed level   Upper Body Dressing : Maximal assistance;Bed level   Lower Body Dressing: Total assistance;Bed level Lower Body Dressing Details (indicate cue type and reason): assist with pull up brief change     Toileting- Clothing Manipulation and Hygiene: Total assistance;Bed level         General ADL Comments: Limited by back pain and pt reported  coldness. Unable to progress OOB today     Vision Baseline Vision/History: 1 Wears glasses Ability to See in Adequate Light: 1 Impaired Patient Visual Report:  (to be further assessed) Vision Assessment?: No apparent visual deficits Additional Comments: able to scan to look at therapists, did not formally assess     Perception     Praxis      Pertinent Vitals/Pain Pain Assessment Pain Assessment: Faces Faces Pain Scale: Hurts even more Pain Location: noted grimacing with bed mobility  though pt touched face when asked to point to where pain was Pain Descriptors / Indicators: Grimacing, Guarding Pain Intervention(s): Monitored during session, Limited activity within patient's tolerance, Repositioned     Hand Dominance     Extremity/Trunk Assessment Upper Extremity Assessment Upper Extremity Assessment: Generalized weakness   Lower Extremity Assessment Lower Extremity Assessment: Defer to PT evaluation   Cervical / Trunk Assessment Cervical / Trunk Assessment: Kyphotic;Other exceptions Cervical / Trunk Exceptions: redness noted on back and small abraison, per chart, ortho tech had notified RN regarding Clinical cytogeneticist Communication: HOH   Cognition Arousal/Alertness: Awake/alert Behavior During Therapy: Flat affect Overall Cognitive Status: No family/caregiver present to determine baseline cognitive functioning Area of Impairment: Orientation, Memory, Following commands                 Orientation Level: Disoriented to, Time, Situation   Memory: Decreased short-term memory Following Commands: Follows one step commands with increased time       General Comments: reports being in hospital in [place], aware of a fall when asked but had forgotten that brace was present and then removed during session. Pt reports year as 1983     General Comments  HR 70s, indicated vtach during session that resolved and pt in no apparent distress    Exercises     Shoulder Instructions      Home Living Family/patient expects to be discharged to:: Assisted living Living Arrangements: Other (Comment) (sister) Available Help at Discharge: Family                         Home Equipment: Rollator (4 wheels);Shower Land (2 wheels);Hospital bed   Additional Comments: Info obtained from chart as pt poor historian.      Prior Functioning/Environment Prior Level of Function : Needs assist;Patient poor historian/Family  not available;History of Falls (last six months)             Mobility Comments: uses Rollator for mobility per chart ADLs Comments: per hospitalization one yeatr ago, pt receives assist for LB dressing but typically able to bathe/toilet self. meals delivered to room. Pt unable to provide CLOF today due to AMS        OT Problem List: Decreased strength;Decreased activity tolerance;Decreased cognition;Decreased safety awareness;Decreased knowledge of use of DME or AE;Decreased knowledge of precautions;Pain      OT Treatment/Interventions: Self-care/ADL training;Therapeutic exercise;Energy conservation;DME and/or AE instruction;Therapeutic activities;Patient/family education    OT Goals(Current goals can be found in the care plan section) Acute Rehab OT Goals Patient Stated Goal: not be cold anymore, appreciative to have brace removed OT Goal Formulation: Patient unable to participate in goal setting Time For Goal Achievement: 12/06/21 Potential to Achieve Goals: Good  OT Frequency: Min 2X/week    Co-evaluation PT/OT/SLP Co-Evaluation/Treatment: Yes Reason for Co-Treatment: Necessary to address cognition/behavior during functional activity;For patient/therapist safety;To address functional/ADL transfers;Other (comment) (pain)   OT goals addressed during session: ADL's and self-care;Strengthening/ROM  AM-PAC OT "6 Clicks" Daily Activity     Outcome Measure Help from another person eating meals?: A Little Help from another person taking care of personal grooming?: A Little Help from another person toileting, which includes using toliet, bedpan, or urinal?: Total Help from another person bathing (including washing, rinsing, drying)?: A Lot Help from another person to put on and taking off regular upper body clothing?: A Lot Help from another person to put on and taking off regular lower body clothing?: Total 6 Click Score: 12   End of Session Equipment Utilized During  Treatment: Back brace Nurse Communication: Mobility status;Precautions;Other (comment) (TLSO brace)  Activity Tolerance: Patient limited by pain Patient left: in bed;with call bell/phone within reach  OT Visit Diagnosis: Pain;Other abnormalities of gait and mobility (R26.89);Muscle weakness (generalized) (M62.81);Other symptoms and signs involving cognitive function Pain - part of body:  (back)                Time: 5284-1324 OT Time Calculation (min): 17 min Charges:  OT General Charges $OT Visit: 1 Visit OT Evaluation $OT Eval Moderate Complexity: 1 Mod  Malachy Chamber, OTR/L Acute Rehab Services Office: [phone]   Layla Maw 11/22/2021, 10:55 AM

## 2021-11-22 NOTE — Hospital Course (Signed)
[name] is a 88 y.o. female with past medical history of CHF, PAH, coronary artery disease, CKD stage III, prior GI bleed, diverticulosis, hypertension and hyperlipidemia, history of COVID-19 infection, recurrent UTI presented to hospital for frequent falls and confusion.  She had a fall with trauma to her head as well.  In the ED, initial vitals were stable.  CBC without leukocytosis hemoglobin 11.1.  Sodium 143 with potassium of 3.8.  Creatinine was 0.9.  Urinalysis showed 6-10 white cells with trace leukocytes.  Urine culture was sent from the ED.  CT head without any acute findings.  CT of the lumbar spine showed fracture through the superior endplate of P03 with multilevel degenerative changes.  Patient was then admitted hospital for further evaluation and treatment.  Assessment and plan.  Recurrent UTI Had increased confusion and falls at home.  On Rocephin at this time.  Follow urine culture.  Patient had previous UTI with E. coli.  Fall with injury  T12 compression fracture  Scalp hematoma  Probable concussion. Patient is currently on TSLO brace.  Check PT OT.  Analgesia.  Mild hypokalemia.  Potassium of 3.1.  We will replace orally.   Abdominal pain  CT scan of the abdomen pelvis without any acute findings. On Rocephin.  Double duct sign/Pancreas  Possible underlying lesion.Marland Kitchen  POA does not wish for further work-up.  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 Med rec pending.  On Lasix Imdur metoprolol at home.   Essential hypertension:  On metoprolol at home.  Med rec pending.   CKD stage IIIa. We will continue to monitor creatinine.  Creatinine at baseline at 0.85.

## 2021-11-22 NOTE — Evaluation (Signed)
Physical Therapy Evaluation Patient Details Name: [name] MRN: [id number] DOB: 10-Sep-1916 Today's Date: 11/22/2021  History of Present Illness  88 yo female presents to [facility] s/p fall + head trauma. Cspine cleared, CTH negative, CT abd/pelvis negative for acute findings. CT lumbar spine shows acute fx through anterior aspect of superior endplate of B09, no retropulsion. UA + UTI. PMH includes CAD, HF, CKD III, diverticulosis, DJD, GIB, HTN, HLD.  Clinical Impression   Pt presents with generalized weakness, back and generalized pain with movement, impaired cognition, impaired activity tolerance, and difficulty mobilizing. Pt to benefit from acute PT to address deficits. Pt tolerated bed-level evaluation only, rolling L and R and repositioning requiring moderate physical assist. PT anticipates SNF needs post-acutely, unsure of pt PLOF given pt is a poor historian this date.  PT to progress mobility as tolerated, and will continue to follow acutely.         Recommendations for follow up therapy are one component of a multi-disciplinary discharge planning process, led by the attending physician.  Recommendations may be updated based on patient status, additional functional criteria and insurance authorization.  Follow Up Recommendations Skilled nursing-short term rehab (<3 hours/day) Can patient physically be transported by private vehicle: No    Assistance Recommended at Discharge Frequent or constant Supervision/Assistance  Patient can return home with the following  A lot of help with walking and/or transfers;A lot of help with bathing/dressing/bathroom    Equipment Recommendations None recommended by PT  Recommendations for Other Services       Functional Status Assessment Patient has had a recent decline in their functional status and demonstrates the ability to make significant improvements in function in a reasonable and predictable amount of time.     Precautions /  Restrictions Precautions Precautions: Fall;Back Precaution Booklet Issued: No Required Braces or Orthoses: Spinal Brace Spinal Brace: Thoracolumbosacral orthotic Restrictions Weight Bearing Restrictions: No Other Position/Activity Restrictions: Confirmed with MD 10/26 permission to update TLSO order to OOB only to preserve skin integrity      Mobility  Bed Mobility Overal bed mobility: Needs Assistance Bed Mobility: Rolling Rolling: Mod assist, +2 for safety/equipment         General bed mobility comments: Min-Mod A to roll side to side for brief change, skin check and TLSO brace removal. cues for sequencing and reaching    Transfers                   General transfer comment: nt - pt declines    Ambulation/Gait                  Stairs            Wheelchair Mobility    Modified Rankin (Stroke Patients Only)       Balance Overall balance assessment: History of Falls, Needs assistance                                           Pertinent Vitals/Pain Pain Assessment Pain Assessment: Faces Faces Pain Scale: Hurts even more Pain Location: noted grimacing with bed mobility though pt touched face when asked to point to where pain was Pain Descriptors / Indicators: Grimacing, Guarding Pain Intervention(s): Limited activity within patient's tolerance, Monitored during session, Repositioned    Home Living Family/patient expects to be discharged to:: Assisted living Living Arrangements: Other (Comment) (sister) Available Help at  Discharge: Family             Home Equipment: Rollator (4 wheels);Shower Land (2 wheels);Hospital bed Additional Comments: Info obtained from chart as pt poor historian.    Prior Function Prior Level of Function : Needs assist;Patient poor historian/Family not available;History of Falls (last six months)             Mobility Comments: uses Rollator for mobility per chart ADLs  Comments: per hospitalization one year ago, pt receives assist for LB dressing but typically able to bathe/toilet self. meals delivered to room. Pt unable to provide CLOF today due to AMS     Hand Dominance   Dominant Hand: Right    Extremity/Trunk Assessment   Upper Extremity Assessment Upper Extremity Assessment: Defer to OT evaluation    Lower Extremity Assessment Lower Extremity Assessment: Generalized weakness    Cervical / Trunk Assessment Cervical / Trunk Assessment: Kyphotic;Other exceptions Cervical / Trunk Exceptions: redness noted on back and small abrasion, per chart, ortho tech had notified RN regarding abrasion  Communication   Communication: HOH  Cognition Arousal/Alertness: Awake/alert Behavior During Therapy: Flat affect Overall Cognitive Status: No family/caregiver present to determine baseline cognitive functioning Area of Impairment: Orientation, Memory, Following commands                 Orientation Level: Disoriented to, Time, Situation   Memory: Decreased short-term memory Following Commands: Follows one step commands with increased time       General Comments: reports being in hospital in [place], aware of a fall when asked but had forgotten that brace was present and then removed during session. Pt reports year as 1983        General Comments General comments (skin integrity, edema, etc.): HR 70s throughout session, alarming "vtach" when pt is at rest but pt in no apparent distress    Exercises     Assessment/Plan    PT Assessment Patient needs continued PT services  PT Problem List Decreased strength;Decreased mobility;Decreased safety awareness;Decreased activity tolerance;Decreased balance;Decreased knowledge of use of DME;Pain;Decreased cognition;Cardiopulmonary status limiting activity;Decreased skin integrity       PT Treatment Interventions DME instruction;Therapeutic activities;Gait training;Therapeutic  exercise;Patient/family education;Balance training;Functional mobility training;Neuromuscular re-education    PT Goals (Current goals can be found in the Care Plan section)  Acute Rehab PT Goals PT Goal Formulation: With patient Time For Goal Achievement: 12/06/21 Potential to Achieve Goals: Good    Frequency Min 2X/week     Co-evaluation   Reason for Co-Treatment: Necessary to address cognition/behavior during functional activity;For patient/therapist safety;To address functional/ADL transfers;Other (comment) (pain)   OT goals addressed during session: ADL's and self-care;Strengthening/ROM       AM-PAC PT "6 Clicks" Mobility  Outcome Measure Help needed turning from your back to your side while in a flat bed without using bedrails?: A Lot Help needed moving from lying on your back to sitting on the side of a flat bed without using bedrails?: A Lot Help needed moving to and from a bed to a chair (including a wheelchair)?: Total Help needed standing up from a chair using your arms (Morgan.g., wheelchair or bedside chair)?: Total Help needed to walk in hospital room?: Total Help needed climbing 3-5 steps with a railing? : Total 6 Click Score: 8    End of Session   Activity Tolerance: Patient limited by fatigue;Patient limited by pain Patient left: in bed;with call bell/phone within reach Nurse Communication: Mobility status PT Visit Diagnosis: Other abnormalities of gait  and mobility (R26.89);Pain;Repeated falls (R29.6) Pain - Right/Left:  (mid) Pain - part of body:  (back)    Time: 0757-3225 PT Time Calculation (min) (ACUTE ONLY): 18 min   Charges:   PT Evaluation $PT Eval Low Complexity: 1 Low         [name] S, PT DPT Acute Rehabilitation Services Pager [phone]  Office [phone]   [name] [name] 11/22/2021, 11:49 AM

## 2021-11-22 NOTE — [place] FL2 (Signed)
[place] MEDICAID FL2 LEVEL OF CARE SCREENING TOOL     IDENTIFICATION  Patient Name: [name] Birthdate: 05-16-1916 Sex: female Admission Date (Current Location): 11/21/2021  [facility] and [place] Number:  Herbalist and Address:  [facility]. [facility], [place] [address] [place], [place] [address]      Provider Number: [id number]  Attending Physician Name and Address:  Flora Lipps, MD  Relative Name and Phone Number:       Current Level of Care: Hospital Recommended Level of Care: [place] Prior Approval Number:    Date Approved/Denied:   PASRR Number: [id number]  Discharge Plan: SNF    Current Diagnoses: Patient Active Problem List   Diagnosis Date Noted   Falls 11/22/2021   T12 compression fracture ([place]) 11/22/2021   Chronic CHF ([place]) 11/22/2021   UTI (urinary tract infection) 05/22/2020   Rectal bleeding 03/11/2020   Hematochezia 03/11/2020   Orthostatic hypotension 08/13/2019   Falls, sequela 08/13/2019   DNR (do not resuscitate) 08/11/2019   SDH (subdural hematoma) ([place]) 08/09/2019   Acute lower UTI 01/02/2018   Acute cystitis without hematuria    Acute encephalopathy 01/01/2018   DJD (degenerative joint disease) 08/09/2016   GI bleed 08/08/2016   Abnormal echocardiogram 06/14/2016   PVC's (premature ventricular contractions)    Bacteremia 09/18/2014   Fever    Febrile illness 09/12/2014   Diverticulitis 09/12/2014   CKD (chronic kidney disease) stage 3, GFR 30-59 ml/min (HCC) 09/12/2014   HTN (hypertension) 09/12/2014   Thrombocytopenia ([place]) 09/12/2014   Abnormal stress test 01/13/2014   Rectal bleed 08/10/2013   Acute renal failure ([place]) 08/10/2013   Essential hypertension, benign 01/08/2013   DOE (dyspnea on exertion) 01/08/2013    Orientation RESPIRATION BLADDER Height & Weight     Self, Time, Situation, Place  Normal Continent, External catheter Weight: 127 lb (57.6 kg) Height:  '5\' 6"'$   (167.6 cm)  BEHAVIORAL SYMPTOMS/MOOD NEUROLOGICAL BOWEL NUTRITION STATUS      Continent Diet  AMBULATORY STATUS COMMUNICATION OF NEEDS Skin   Extensive Assist Verbally Normal                       Personal Care Assistance Level of Assistance  Bathing, Feeding, Dressing Bathing Assistance: Limited assistance Feeding assistance: Limited assistance Dressing Assistance: Limited assistance     Functional Limitations Info  Speech, Hearing, Sight Sight Info: Adequate (Glasses) Hearing Info: Adequate Speech Info: Adequate    SPECIAL CARE FACTORS FREQUENCY  PT (By licensed PT), OT (By licensed OT)     PT Frequency: 5x weekly OT Frequency: 5x weekly            Contractures Contractures Info: Not present    Additional Factors Info  Code Status, Allergies Code Status Info: DNR Allergies Info: Ace Inhibitors   Streptomycin   Tramadol   Vesicare (Solifenacin Succinate)   Penicillins           Current Medications (11/22/2021):  This is the current hospital active medication list Current Facility-Administered Medications  Medication Dose Route Frequency Provider Last Rate Last Admin   acetaminophen (TYLENOL) tablet 650 mg  650 mg Oral Q6H PRN Clance Boll, MD       Or   acetaminophen (TYLENOL) suppository 650 mg  650 mg Rectal Q6H PRN Clance Boll, MD       albuterol (PROVENTIL) (2.5 MG/3ML) 0.083% nebulizer solution 2.5 mg  2.5 mg Nebulization Q2H PRN Clance Boll,  MD       amLODipine (NORVASC) tablet 2.5 mg  2.5 mg Oral Daily Pokhrel, Laxman, MD       estrogens (conjugated) (PREMARIN) tablet 0.3 mg  0.3 mg Oral Daily Pokhrel, Laxman, MD       isosorbide mononitrate (IMDUR) 24 hr tablet 15 mg  15 mg Oral Daily Pokhrel, Laxman, MD       [START ON 11/23/2021] loperamide (IMODIUM) capsule 2 mg  2 mg Oral Q M,W,F Pokhrel, Laxman, MD       meclizine (ANTIVERT) tablet 25 mg  25 mg Oral BID PRN Pokhrel, Laxman, MD       metoprolol succinate (TOPROL-XL) 24 hr  tablet 12.5 mg  12.5 mg Oral Daily Pokhrel, Laxman, MD       [START ON 11/23/2021] pantoprazole (PROTONIX) EC tablet 40 mg  40 mg Oral Q M,W,F Pokhrel, Laxman, MD       PARoxetine (PAXIL) tablet 10 mg  10 mg Oral Daily Pokhrel, Laxman, MD       polyethylene glycol (MIRALAX / GLYCOLAX) packet 17 g  17 g Oral Daily PRN Pokhrel, Laxman, MD       polyvinyl alcohol (LIQUIFILM TEARS) 1.4 % ophthalmic solution 1 drop  1 drop Both Eyes PRN Pokhrel, Laxman, MD       potassium chloride SA (KLOR-CON M) CR tablet 40 mEq  40 mEq Oral Daily Pokhrel, Laxman, MD       prenatal multivitamin tablet 1 tablet  1 tablet Oral Q1200 Pokhrel, Laxman, MD       traZODone (DESYREL) tablet 25 mg  25 mg Oral QHS PRN Pokhrel, Laxman, MD       Current Outpatient Medications  Medication Sig Dispense Refill   acetaminophen (TYLENOL) 325 MG tablet Take 325-650 mg by mouth every 6 (six) hours as needed for mild pain.     amLODipine (NORVASC) 2.5 MG tablet Take 2.5 mg by mouth daily.     furosemide (LASIX) 40 MG tablet Take 20 mg by mouth See admin instructions. MON WED FRI SAT     isosorbide mononitrate (IMDUR) 30 MG 24 hr tablet Take 0.5 tablets (15 mg total) by mouth daily for 14 days. (Patient taking differently: Take 30 mg by mouth daily.) 7 tablet 0   loperamide (IMODIUM) 2 MG capsule Take 2 mg by mouth every Monday, Wednesday, and Friday.     meclizine (ANTIVERT) 25 MG tablet Take 25 mg by mouth 2 (two) times daily as needed for dizziness.     metoprolol succinate (TOPROL-XL) 25 MG 24 hr tablet Take 0.5 tablets (12.5 mg total) by mouth daily for 14 days. (Patient taking differently: Take 25 mg by mouth in the morning and at bedtime.) 7 tablet 0   pantoprazole (PROTONIX) 40 MG tablet Take 40 mg by mouth every Monday, Wednesday, and Friday.     PARoxetine (PAXIL) 10 MG tablet Take 10 mg by mouth daily.     polyethylene glycol (MIRALAX) packet Take 17 g by mouth daily as needed for mild constipation. 14 each 0   potassium  chloride SA (KLOR-CON M) 20 MEQ tablet Take 20 mEq by mouth daily.     PREMARIN 0.3 MG tablet Take 0.15 mg by mouth daily.     Prenatal Vit-Fe Fumarate-FA (PRENATAL MULTIVITAMIN) TABS tablet Take 1 tablet by mouth daily at 12 noon.     Propylene Glycol (SYSTANE BALANCE) 0.6 % SOLN Place 1 drop into both eyes daily as needed (dry eyes).     traZODone (  DESYREL) 50 MG tablet Take 25-50 mg by mouth at bedtime as needed for sleep.     furosemide (LASIX) 20 MG tablet Take 1 tablet (20 mg total) by mouth every Monday, Wednesday, and Friday. Take 0.5 tablet by mouth Monday, Wednesday, Friday & Saturday (Patient not taking: Reported on 11/22/2021)     potassium chloride (KLOR-CON) 10 MEQ tablet Take 1 tablet (10 mEq total) by mouth every Monday, Wednesday, and Friday. (Patient not taking: Reported on 11/22/2021)       Discharge Medications: Please see discharge summary for a list of discharge medications.  Relevant Imaging Results:  Relevant Lab Results:   Additional Information SSN: [id number]  Archie Endo, LCSW

## 2021-11-23 DIAGNOSIS — Z66 Do not resuscitate: Secondary | ICD-10-CM | POA: Diagnosis not present

## 2021-11-23 DIAGNOSIS — N1831 Chronic kidney disease, stage 3a: Secondary | ICD-10-CM | POA: Diagnosis not present

## 2021-11-23 DIAGNOSIS — E876 Hypokalemia: Secondary | ICD-10-CM

## 2021-11-23 DIAGNOSIS — L8990 Pressure ulcer of unspecified site, unspecified stage: Secondary | ICD-10-CM | POA: Insufficient documentation

## 2021-11-23 DIAGNOSIS — I5042 Chronic combined systolic (congestive) and diastolic (congestive) heart failure: Secondary | ICD-10-CM | POA: Diagnosis not present

## 2021-11-23 DIAGNOSIS — W19XXXA Unspecified fall, initial encounter: Secondary | ICD-10-CM | POA: Diagnosis not present

## 2021-11-23 LAB — BASIC METABOLIC PANEL
Anion gap: 12 (ref 5–15)
BUN: 10 mg/dL (ref 8–23)
CO2: 23 mmol/L (ref 22–32)
Calcium: 8.9 mg/dL (ref 8.9–10.3)
Chloride: 108 mmol/L (ref 98–111)
Creatinine, Ser: 0.82 mg/dL (ref 0.44–1.00)
GFR, Estimated: >60 mL/min (ref >60)
Glucose, Bld: 83 mg/dL (ref 70–99)
Potassium: 3.1 mmol/L — ABNORMAL LOW (ref 3.5–5.1)
Sodium: 143 mmol/L (ref 135–145)

## 2021-11-23 LAB — CBC
HCT: 35.7 % — ABNORMAL LOW (ref 36.0–46.0)
Hemoglobin: 11.8 g/dL — ABNORMAL LOW (ref 12.0–15.0)
MCH: 30.3 pg (ref 26.0–34.0)
MCHC: 33.1 g/dL (ref 30.0–36.0)
MCV: 91.8 fL (ref 80.0–100.0)
Platelets: 197 10*3/uL (ref 150–400)
RBC: 3.89 MIL/uL (ref 3.87–5.11)
RDW: 14.9 % (ref 11.5–15.5)
WBC: 8.4 10*3/uL (ref 4.0–10.5)
nRBC: 0 % (ref 0.0–0.2)

## 2021-11-23 LAB — MAGNESIUM: Magnesium: 1.6 mg/dL — ABNORMAL LOW (ref 1.7–2.4)

## 2021-11-23 MED ORDER — MAGNESIUM SULFATE 2 GM/50ML IV SOLN
2.0000 g | Freq: Once | INTRAVENOUS | Status: AC
  Administered 2021-11-23: 2 g via INTRAVENOUS
  Filled 2021-11-23: qty 50

## 2021-11-23 MED ORDER — POTASSIUM CHLORIDE CRYS ER 20 MEQ PO TBCR
40.0000 meq | EXTENDED_RELEASE_TABLET | Freq: Two times a day (BID) | ORAL | Status: AC
  Administered 2021-11-23 – 2021-11-24 (×3): 40 meq via ORAL
  Filled 2021-11-23 (×3): qty 2

## 2021-11-23 MED ORDER — HYDROCODONE-ACETAMINOPHEN 5-325 MG PO TABS
1.0000 | ORAL_TABLET | Freq: Four times a day (QID) | ORAL | Status: DC | PRN
  Administered 2021-11-23: 1 via ORAL
  Filled 2021-11-23: qty 1

## 2021-11-23 MED ORDER — LIDOCAINE 5 % EX PTCH
1.0000 | MEDICATED_PATCH | CUTANEOUS | Status: DC
  Administered 2021-11-23 – 2021-11-24 (×2): 1 via TRANSDERMAL
  Filled 2021-11-23 (×2): qty 1

## 2021-11-23 MED ORDER — MAGNESIUM OXIDE -MG SUPPLEMENT 400 (240 MG) MG PO TABS
400.0000 mg | ORAL_TABLET | Freq: Two times a day (BID) | ORAL | Status: DC
  Administered 2021-11-23 – 2021-11-25 (×5): 400 mg via ORAL
  Filled 2021-11-23 (×5): qty 1

## 2021-11-23 NOTE — Progress Notes (Signed)
Initial Nutrition Assessment  DOCUMENTATION CODES:  Severe malnutrition in context of social or environmental circumstances  INTERVENTION:  Continue regular diet, encourage PO intake Changed to ordering assist Ice cream with all meals Request new measured weight  NUTRITION DIAGNOSIS:   Severe Malnutrition (in the context of social/environmental circumstances) related to  (inadequate oral intake) as evidenced by severe fat depletion, severe muscle depletion.  GOAL:   Patient will meet greater than or equal to 90% of their needs  MONITOR:   PO intake, Labs, Skin  REASON FOR ASSESSMENT:   Malnutrition Screening Tool    ASSESSMENT:   Pt with hx of CHF, CAD, CKD3, HTN, and HLD presented to ED after a fall at home where she stuck her head. Found to have a UTI  Pt resting in bed at the time of assessment eating lunch. Pt happy to be on a regular diet now and pleased with MD that he told her she could have some salt.   Significant fat and muscle deficits present on exam. Visitor in room endorses weight loss, states that pt eats well most days, but that some days pt just wants to sleep all day. Used to weigh ~130 lb but at recent PCP visit was down to 108 lbs.   Pt does not like ensure or boost, states she will not drink them but does like strawberry ice cream not strawberry. Added ice cream to all trays and encouraged good PO intake.  Average Meal Intake: 10/26: 75% intake x 1 recorded meals  Nutritionally Relevant Medications: Scheduled Meds:  loperamide  2 mg Oral Q M,W,F   magnesium oxide  400 mg Oral BID   pantoprazole  40 mg Oral Q M,W,F   potassium chloride  40 mEq Oral BID   prenatal multivitamin  1 tablet Oral Q1200   Continuous Infusions:  cefTRIAXone (ROCEPHIN)  IV 1 g (11/22/21 2152)   PRN Meds: meclizine, polyethylene glycol  Labs Reviewed: K 3.1 Mg 1.6  NUTRITION - FOCUSED PHYSICAL EXAM:  Flowsheet Row Most Recent Value  Orbital Region Severe  depletion  Upper Arm Region Severe depletion  Thoracic and Lumbar Region Severe depletion  Buccal Region Mild depletion  Temple Region Severe depletion  Clavicle Bone Region Severe depletion  Clavicle and Acromion Bone Region Severe depletion  Scapular Bone Region Severe depletion  Dorsal Hand Severe depletion  Patellar Region Mild depletion  Anterior Thigh Region Mild depletion  Posterior Calf Region Mild depletion  Edema (RD Assessment) Mild  Hair Reviewed  Eyes Reviewed  Mouth Reviewed  Skin Reviewed  Nails Reviewed    Diet Order:   Diet Order             Diet regular Room service appropriate? Yes; Fluid consistency: Thin  Diet effective now                   EDUCATION NEEDS:   No education needs have been identified at this time  Skin:  Skin Assessment: Reviewed RN Assessment (stage 2 Vertebrae)  Last BM:  10/27 - type 6  Height:  Ht Readings from Last 1 Encounters:  11/21/21 '5\' 6"'$  (1.676 m)    Weight:  Wt Readings from Last 1 Encounters:  11/21/21 57.6 kg    Ideal Body Weight:  59.1 kg  BMI:  Body mass index is 20.5 kg/m.  Estimated Nutritional Needs:  Kcal:  1400-1600 kcal/d Protein:  70-85g/d Fluid:  >/=1.5L/d    Ranell Patrick, RD, LDN Clinical Dietitian RD pager #  available in [facility]  After hours/weekend pager # available in [facility]

## 2021-11-23 NOTE — TOC CAGE-AID Note (Signed)
Transition of Care [facility]) - CAGE-AID Screening   Patient Details  Name: [name] MRN: [id number] Date of Birth: Feb 19, 1916  [facility]) CM/SW Contact:    Coralee Pesa, [place] Phone Number: 11/23/2021, 10:41 AM   Clinical Narrative: Per chart review, pt is currently disoriented and not appropriate for CAGE- AID assessment.  CAGE-AID Screening: Substance Abuse Screening unable to be completed due to: : Patient unable to participate (Disoriented)

## 2021-11-23 NOTE — Progress Notes (Addendum)
PROGRESS NOTE    [name]  YWV:[id number] DOB: 20-Oct-1916 DOA: 11/21/2021 PCP: Seward Carol, MD    Brief Narrative:   [name] is a 88 y.o. female with past medical history of congestive heart failure, pulmonary arterial hypertension, coronary artery disease, CKD stage III, prior GI bleed, diverticulosis, hypertension and hyperlipidemia, history of COVID-19 infection, recurrent UTI presented to hospital for frequent falls and confusion.  She had a fall with trauma to her head as well.  In the ED, initial vitals were stable.  CBC without leukocytosis hemoglobin 11.1.  Sodium 143 with potassium of 3.8.  Creatinine was 0.9.  Urinalysis showed 6-10 white cells with trace leukocytes.  Urine culture was sent from the ED.  CT head without any acute findings.  CT of the lumbar spine showed fracture through the superior endplate of W54 with multilevel degenerative changes.  Patient was then admitted hospital for further evaluation and treatment.  Assessment and plan. Principal Problem:   Falls Active Problems:   Essential hypertension, benign   DOE (dyspnea on exertion)   CKD (chronic kidney disease) stage 3, GFR 30-59 ml/min (HCC)   DNR (do not resuscitate)   UTI (urinary tract infection)   T12 compression fracture (HCC)   Chronic CHF (HCC)   Pressure injury of skin   Hypokalemia   Hypomagnesemia   Fall with injury  T12 compression fracture  Scalp hematoma  Probable concussion. Patient is currently on TSLO brace will ambulating.  No pain while resting.  Physical and occupational therapy has seen the patient and recommended skilled nursing facility placement.  Continue analgesia for pain.  Recurrent UTI Had increased confusion and falls at home.  On IV Rocephin at this time.  Urine culture showing E. coli and Klebsiella..  Patient had previous UTI with E. coli.  Mild hypokalemia.  Potassium of 3.1.  Continue to replenish orally.  Check levels in AM.     Hypomagnesemia.  We will replace orally.  Add one-time dose of 2 g of magnesium sulfate due to persistent hypokalemia.  Abdominal pain  Improved.  CT scan of the abdomen pelvis without any acute findings.On Rocephin.  Urine culture with E. coli and Klebsiella.  Double duct sign/Pancreas  Possible underlying lesion.Marland Kitchen  POA does not wish for further work-up.  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  On Lasix, amlodipine, metoprolol at home.  Hold off with Lasix today.   Essential hypertension:  On metoprolol and amlodipine at home.  Continue for now.  Blood pressure is controlled at this time.   CKD stage IIIa. We will continue to monitor creatinine.  Creatinine at baseline at 0.8   Deconditioning debility, recurrent falls.  Patient has been seen by physical and Occupational Therapy who recommended skilled nursing facility placement.      DVT prophylaxis: SCDs Start: 11/21/21 2253   Code Status:     Code Status: DNR  Disposition: Skilled nursing facility as per PT recommendation.  Status is: Inpatient  Remains inpatient appropriate because: Falls, electrolyte imbalance, need for rehabilitation at skilled nursing facility   Family Communication:  Spoke with the patient's niece Ms. [name] on the phone and updated her about the clinical condition of the patient.  Consultants:  None  Procedures:  None  Antimicrobials:  Rocephin IV  Anti-infectives (From admission, onward)    Start     Dose/Rate Route Frequency Ordered Stop   11/22/21 2200  cefTRIAXone (ROCEPHIN) 1 g in sodium chloride 0.9 % 100 mL IVPB  1 g 200 mL/hr over 30 Minutes Intravenous Every 24 hours 11/22/21 2040     11/21/21 2045  cefTRIAXone (ROCEPHIN) 1 g in sodium chloride 0.9 % 100 mL IVPB        1 g 200 mL/hr over 30 Minutes Intravenous  Once 11/21/21 2037 11/22/21 0131      Subjective: Today, patient was seen and examined at bedside.  Complains of mild pain on the back.  Denies  any nausea vomiting fever chills or rigor  Objective: Vitals:   11/22/21 2335 11/23/21 0400 11/23/21 0833 11/23/21 1109  BP: 123/69 124/62 122/68 133/74  Pulse: 86 83 76 87  Resp: '17 16 17 17  '$ Temp: 98.4 F (36.9 C) (!) 97.5 F (36.4 C) 98.5 F (36.9 C) 98 F (36.7 C)  TempSrc: Oral Axillary Oral Oral  SpO2: 98% 96% 93% 100%  Weight:      Height:        Intake/Output Summary (Last 24 hours) at 11/23/2021 1150 Last data filed at 11/23/2021 0422 Gross per 24 hour  Intake 400 ml  Output 800 ml  Net -400 ml   Filed Weights   11/21/21 1600  Weight: 57.6 kg    Physical Examination: Body mass index is 20.5 kg/m.   General:  not in obvious distress, elderly female, thinly built, HENT:   No scleral pallor or icterus noted. Oral mucosa is moist.  Chest:  Clear breath sounds.  Diminished breath sounds bilaterally. No crackles or wheezes.  Tenderness over the thoracic spine. CVS: S1 &S2 heard. No murmur.  Regular rate and rhythm. Abdomen: Soft, nontender, nondistended.  Bowel sounds are heard.   Extremities: No cyanosis, clubbing or edema.  Peripheral pulses are palpable. Psych: Alert, awake and Communicative. CNS:  No cranial nerve deficits.  Moves extremities. Skin: Warm and dry.  No rashes noted.  Data Reviewed:   CBC: Recent Labs  Lab 11/21/21 1612 11/21/21 1621 11/22/21 0420 11/23/21 0637  WBC 6.7  --  9.7 8.4  NEUTROABS 3.8  --   --   --   HGB 11.1* 11.9* 12.1 11.8*  HCT 35.3* 35.0* 38.1 35.7*  MCV 94.4  --  93.8 91.8  PLT 191  --  198 086    Basic Metabolic Panel: Recent Labs  Lab 11/21/21 1612 11/21/21 1621 11/22/21 0420 11/23/21 0637  NA 143 143 141 143  K 3.7 3.8 3.1* 3.1*  CL 107 109 108 108  CO2 23  --  24 23  GLUCOSE 101* 98 100* 83  BUN '11 14 9 10  '$ CREATININE 0.98 0.90 0.85 0.82  CALCIUM 9.0  --  8.6* 8.9  MG  --   --   --  1.6*    Liver Function Tests: Recent Labs  Lab 11/21/21 1612 11/22/21 0420  AST 17 23  ALT 7 8  ALKPHOS 66  71  BILITOT 0.1* 0.4  PROT 7.1 7.2  ALBUMIN 3.0* 3.0*     Radiology Studies: CT L-SPINE NO CHARGE  Result Date: 11/21/2021 CLINICAL DATA:  Fall on blood thinners EXAM: CT Thoracic and Lumbar spine with contrast TECHNIQUE: Multiplanar CT images of the thoracic and lumbar spine were reconstructed from contemporary CT of the Chest, Abdomen, and Pelvis. RADIATION DOSE REDUCTION: This exam was performed according to the departmental dose-optimization program which includes automated exposure control, adjustment of the mA and/or kV according to patient size and/or use of iterative reconstruction technique. CONTRAST:  No additional contrast given, 75 mL Omnipaque 350 given for concurrent  CT chest abdomen pelvis COMPARISON:  CT abdomen pelvis 05/22/2020, thoracic spine radiographs 09/05/2016 FINDINGS: CT THORACIC SPINE FINDINGS Alignment: S shaped curvature of the thoracolumbar spine. Preservation of the normal thoracic kyphosis. No listhesis. Vertebrae: Acute fracture through the anterior superior endplate of G81 (series 5, image 44), involving the anterior osteophytes. Flowing osteophytes are noted T10-L2, with focal disruption at T 12. Osseous fusion of T12-L2. No definite retropulsion of the posterior cortex of T12, although there are large posterior osteophytes at T12-L1; these appear unchanged compared to 05/22/2020. No other acute fracture or suspicious osseous lesion. Paraspinal and other soft tissues: Please see same-day CT chest abdomen pelvis. Disc levels: Multilevel degenerative changes. Small disc osteophyte formations at T8-T9 and T9-T10. No high-grade spinal canal stenosis. Mild left neural foraminal narrowing at T9-T10 and T11-T12. CT LUMBAR SPINE FINDINGS Segmentation: 5 lumbar-type vertebral bodies. Alignment: S shaped curvature of the thoracolumbar spine, with dextrocurvature of the thoracolumbar junction and compensatory levocurvature of the lower lumbar spine. 3 mm retrolisthesis of L2 on L3.  6 mm retrolisthesis L3 on L4. 3 mm retrolisthesis L4 on L5. Vertebrae: As described above, there is an acute fracture through the anterosuperior aspect of T12, involving the large flowing anterior osteophytes, which extend to the level of L2. No additional fracture is seen, although evaluation is limited by degree of osteopenia. Osseous fusion of T12-L2. Additional acute fracture is seen. No suspicious osseous lesion. Paraspinal and other soft tissues: Please see same-day CT chest abdomen pelvis. Disc levels: T12-L1: Osseous fusion across the disc space with large circumferential osteophytes. Severe left and mild right facet arthropathy. Moderate to severe spinal canal stenosis. No neural foraminal narrowing. L1-L2: Osseous fusion across the disc space with circumferential osteophytes. Moderate facet arthropathy. Moderate spinal canal stenosis. Mild bilateral neural foraminal narrowing. L2-L3: Trace retrolisthesis and disc osteophyte complex. Moderate facet arthropathy. Moderate spinal canal stenosis. Moderate left neural foraminal narrowing. L3-L4: Retrolisthesis. Small posterior disc osteophyte complex. Moderate facet arthropathy. No spinal canal stenosis. Moderate bilateral neural foraminal narrowing. L4-L5: Trace retrolisthesis and moderate disc bulge. Moderate to severe facet arthropathy. Mild spinal canal stenosis. Moderate bilateral neural foraminal narrowing. L5-S1: Disc height loss and disc osteophyte complex. Moderate facet arthropathy. No spinal canal stenosis. Severe left and mild right neural foraminal narrowing. IMPRESSION: 1. Acute fracture through the anterior superior endplate of L57, involving the anterior osteophytes, without retropulsion. No additional fracture is seen in the thoracic or lumbar spine, although evaluation is limited by osteopenia. This could be further evaluated with MRI if clinically indicated. 2. Multilevel degenerative changes in the thoracic and lumbar spine, as described  above. 3. For soft tissue findings, please see same-day CT chest abdomen pelvis. These results were called by telephone at the time of interpretation on 11/21/2021 at 7:16 pm to provider [facility] , who verbally acknowledged these results. Electronically Signed   By: Merilyn Baba M.D.   On: 11/21/2021 19:20   CT T-SPINE NO CHARGE  Result Date: 11/21/2021 CLINICAL DATA:  Fall on blood thinners EXAM: CT Thoracic and Lumbar spine with contrast TECHNIQUE: Multiplanar CT images of the thoracic and lumbar spine were reconstructed from contemporary CT of the Chest, Abdomen, and Pelvis. RADIATION DOSE REDUCTION: This exam was performed according to the departmental dose-optimization program which includes automated exposure control, adjustment of the mA and/or kV according to patient size and/or use of iterative reconstruction technique. CONTRAST:  No additional contrast given, 75 mL Omnipaque 350 given for concurrent CT chest abdomen pelvis COMPARISON:  CT abdomen  pelvis 05/22/2020, thoracic spine radiographs 09/05/2016 FINDINGS: CT THORACIC SPINE FINDINGS Alignment: S shaped curvature of the thoracolumbar spine. Preservation of the normal thoracic kyphosis. No listhesis. Vertebrae: Acute fracture through the anterior superior endplate of V78 (series 5, image 44), involving the anterior osteophytes. Flowing osteophytes are noted T10-L2, with focal disruption at T 12. Osseous fusion of T12-L2. No definite retropulsion of the posterior cortex of T12, although there are large posterior osteophytes at T12-L1; these appear unchanged compared to 05/22/2020. No other acute fracture or suspicious osseous lesion. Paraspinal and other soft tissues: Please see same-day CT chest abdomen pelvis. Disc levels: Multilevel degenerative changes. Small disc osteophyte formations at T8-T9 and T9-T10. No high-grade spinal canal stenosis. Mild left neural foraminal narrowing at T9-T10 and T11-T12. CT LUMBAR SPINE FINDINGS  Segmentation: 5 lumbar-type vertebral bodies. Alignment: S shaped curvature of the thoracolumbar spine, with dextrocurvature of the thoracolumbar junction and compensatory levocurvature of the lower lumbar spine. 3 mm retrolisthesis of L2 on L3. 6 mm retrolisthesis L3 on L4. 3 mm retrolisthesis L4 on L5. Vertebrae: As described above, there is an acute fracture through the anterosuperior aspect of T12, involving the large flowing anterior osteophytes, which extend to the level of L2. No additional fracture is seen, although evaluation is limited by degree of osteopenia. Osseous fusion of T12-L2. Additional acute fracture is seen. No suspicious osseous lesion. Paraspinal and other soft tissues: Please see same-day CT chest abdomen pelvis. Disc levels: T12-L1: Osseous fusion across the disc space with large circumferential osteophytes. Severe left and mild right facet arthropathy. Moderate to severe spinal canal stenosis. No neural foraminal narrowing. L1-L2: Osseous fusion across the disc space with circumferential osteophytes. Moderate facet arthropathy. Moderate spinal canal stenosis. Mild bilateral neural foraminal narrowing. L2-L3: Trace retrolisthesis and disc osteophyte complex. Moderate facet arthropathy. Moderate spinal canal stenosis. Moderate left neural foraminal narrowing. L3-L4: Retrolisthesis. Small posterior disc osteophyte complex. Moderate facet arthropathy. No spinal canal stenosis. Moderate bilateral neural foraminal narrowing. L4-L5: Trace retrolisthesis and moderate disc bulge. Moderate to severe facet arthropathy. Mild spinal canal stenosis. Moderate bilateral neural foraminal narrowing. L5-S1: Disc height loss and disc osteophyte complex. Moderate facet arthropathy. No spinal canal stenosis. Severe left and mild right neural foraminal narrowing. IMPRESSION: 1. Acute fracture through the anterior superior endplate of I69, involving the anterior osteophytes, without retropulsion. No additional  fracture is seen in the thoracic or lumbar spine, although evaluation is limited by osteopenia. This could be further evaluated with MRI if clinically indicated. 2. Multilevel degenerative changes in the thoracic and lumbar spine, as described above. 3. For soft tissue findings, please see same-day CT chest abdomen pelvis. These results were called by telephone at the time of interpretation on 11/21/2021 at 7:16 pm to provider [facility] , who verbally acknowledged these results. Electronically Signed   By: Merilyn Baba M.D.   On: 11/21/2021 19:20   CT CHEST ABDOMEN PELVIS W CONTRAST  Result Date: 11/21/2021 CLINICAL DATA:  Polytrauma, blunt.  Fall EXAM: CT CHEST, ABDOMEN, AND PELVIS WITH CONTRAST TECHNIQUE: Multidetector CT imaging of the chest, abdomen and pelvis was performed following the standard protocol during bolus administration of intravenous contrast. RADIATION DOSE REDUCTION: This exam was performed according to the departmental dose-optimization program which includes automated exposure control, adjustment of the mA and/or kV according to patient size and/or use of iterative reconstruction technique. CONTRAST:  23m OMNIPAQUE IOHEXOL 350 MG/ML SOLN COMPARISON:  None Available. FINDINGS: CHEST: Cardiovascular: No aortic injury. The thoracic aorta is normal in caliber. The  heart is normal in size. No significant pericardial effusion. Atherosclerotic plaque. Mediastinum/Nodes: No pneumomediastinum. No mediastinal hematoma. The esophagus is unremarkable. At least moderate volume hiatal hernia. The thyroid is unremarkable. The central airways are patent. No mediastinal, hilar, or axillary lymphadenopathy. Lungs/Pleura: Bilateral lower lobe subsegmental atelectasis. No focal consolidation. No pulmonary nodule. No pulmonary mass. No pulmonary contusion or laceration. No pneumatocele formation. No pleural effusion. No pneumothorax. No hemothorax. Musculoskeletal/Chest wall: No chest wall mass. No  acute rib or sternal fracture. Please see separately dictated CT thoracolumbar spine 11/21/2021. Degenerative Changes of the partially visualized right upper extremity. ABDOMEN / PELVIS: Hepatobiliary: Not enlarged. No focal lesion. No laceration or subcapsular hematoma. The gallbladder is otherwise unremarkable with no radio-opaque gallstones. Intra and extrahepatic biliary ductal dilatation. Pancreas: Normal pancreatic contour. Main pancreatic duct dilatation measuring up to 6 mm. Spleen: Not enlarged. No focal lesion. No laceration, subcapsular hematoma, or vascular injury. Adrenals/Urinary Tract: No nodularity bilaterally. Bilateral kidneys enhance symmetrically. No hydronephrosis. No contusion, laceration, or subcapsular hematoma. Subcentimeter hypodensities are too small to characterize. No injury to the vascular structures or collecting systems. No hydroureter. The urinary bladder is unremarkable. Stomach/Bowel: No small or large bowel wall thickening or dilatation. The appendix is not definitely identified with no inflammatory changes in the right lower quadrant to suggest acute appendicitis. Vasculature/Lymphatics: No abdominal aorta or iliac aneurysm. No active contrast extravasation or pseudoaneurysm. No abdominal, pelvic, inguinal lymphadenopathy. Reproductive: Status post hysterectomy.  No adnexal masses. Other: No simple free fluid ascites. No pneumoperitoneum. No hemoperitoneum. No mesenteric hematoma identified. No organized fluid collection. Musculoskeletal: No significant soft tissue hematoma. No acute pelvic fracture. Please see separately dictated CT thoracolumbar spine 11/21/2021. Ports and Devices: None. IMPRESSION: 1. No acute traumatic injury to the chest, abdomen, or pelvis. 2. Please see separately dictated CT thoracolumbar spine 11/21/2021. 3. Other imaging findings of potential clinical significance: At least moderate volume hiatal hernia. Double duct sign with mild biliary and pancreatic  duct dilatation. Colonic diverticulosis with no acute diverticulitis. Electronically Signed   By: Iven Finn M.D.   On: 11/21/2021 18:15   CT Head Wo Contrast  Result Date: 11/21/2021 CLINICAL DATA:  Fall.  On blood thinner EXAM: CT HEAD WITHOUT CONTRAST CT CERVICAL SPINE WITHOUT CONTRAST TECHNIQUE: Multidetector CT imaging of the head and cervical spine was performed following the standard protocol without intravenous contrast. Multiplanar CT image reconstructions of the cervical spine were also generated. RADIATION DOSE REDUCTION: This exam was performed according to the departmental dose-optimization program which includes automated exposure control, adjustment of the mA and/or kV according to patient size and/or use of iterative reconstruction technique. COMPARISON:  CT head 08/11/2019 FINDINGS: CT HEAD FINDINGS Brain: Motion degraded study. Generalized atrophy. Chronic microvascular ischemic change in the white matter. Interval resolution of subdural hematoma on the left Negative for acute infarct, hemorrhage, mass Vascular: Negative for hyperdense vessel Skull: Negative Sinuses/Orbits: Mild mucosal edema paranasal sinuses. Bilateral cataract extraction Other: None CT CERVICAL SPINE FINDINGS Alignment: Mild anterolisthesis C3-4 and C4-5 and C7-T1 Skull base and vertebrae: Negative for fracture. Cystic endplate changes at J6-9 and C6-7 as noted in 2018 CT. Soft tissues and spinal canal: Negative for soft tissue mass or swelling Disc levels: Advanced spondylosis. Multilevel disc and facet degeneration. Foraminal narrowing bilaterally at multiple levels due to spurring. Central canal patent. Upper chest: Lung apices clear bilaterally Other: None IMPRESSION: No acute intracranial abnormality. Atrophy and chronic microvascular ischemic change in the white matter. Cervical spondylosis. Negative for fracture. Electronically Signed  By: Franchot Gallo M.D.   On: 11/21/2021 18:08   CT Cervical Spine Wo  Contrast  Result Date: 11/21/2021 CLINICAL DATA:  Fall.  On blood thinner EXAM: CT HEAD WITHOUT CONTRAST CT CERVICAL SPINE WITHOUT CONTRAST TECHNIQUE: Multidetector CT imaging of the head and cervical spine was performed following the standard protocol without intravenous contrast. Multiplanar CT image reconstructions of the cervical spine were also generated. RADIATION DOSE REDUCTION: This exam was performed according to the departmental dose-optimization program which includes automated exposure control, adjustment of the mA and/or kV according to patient size and/or use of iterative reconstruction technique. COMPARISON:  CT head 08/11/2019 FINDINGS: CT HEAD FINDINGS Brain: Motion degraded study. Generalized atrophy. Chronic microvascular ischemic change in the white matter. Interval resolution of subdural hematoma on the left Negative for acute infarct, hemorrhage, mass Vascular: Negative for hyperdense vessel Skull: Negative Sinuses/Orbits: Mild mucosal edema paranasal sinuses. Bilateral cataract extraction Other: None CT CERVICAL SPINE FINDINGS Alignment: Mild anterolisthesis C3-4 and C4-5 and C7-T1 Skull base and vertebrae: Negative for fracture. Cystic endplate changes at T7-0 and C6-7 as noted in 2018 CT. Soft tissues and spinal canal: Negative for soft tissue mass or swelling Disc levels: Advanced spondylosis. Multilevel disc and facet degeneration. Foraminal narrowing bilaterally at multiple levels due to spurring. Central canal patent. Upper chest: Lung apices clear bilaterally Other: None IMPRESSION: No acute intracranial abnormality. Atrophy and chronic microvascular ischemic change in the white matter. Cervical spondylosis. Negative for fracture. Electronically Signed   By: Franchot Gallo M.D.   On: 11/21/2021 18:08   DG Pelvis Portable  Result Date: 11/21/2021 CLINICAL DATA:  Neck and back pain after fall EXAM: PORTABLE PELVIS 1-2 VIEWS COMPARISON:  07/23/2018 FINDINGS: Supine frontal view of  the pelvis includes both hips. No acute displaced fracture, subluxation, or dislocation. There is severe bilateral hip osteoarthritis again noted. The sacroiliac joints are unremarkable. Soft tissues are normal. IMPRESSION: 1. Stable bilateral hip osteoarthritis.  No displaced fracture. Electronically Signed   By: Randa Ngo M.D.   On: 11/21/2021 16:21   DG Chest Portable 1 View  Result Date: 11/21/2021 CLINICAL DATA:  Neck and back pain after fall, diffuse abdominal pain EXAM: PORTABLE CHEST 1 VIEW COMPARISON:  04/14/2021 FINDINGS: Single frontal view of the chest demonstrates an unremarkable cardiac silhouette. Continued ectasia of the thoracic aorta. No acute airspace disease, effusion, or pneumothorax. Multilevel thoracolumbar spondylosis. No evidence of acute fracture. IMPRESSION: 1. No acute intrathoracic process. Electronically Signed   By: Randa Ngo M.D.   On: 11/21/2021 16:20      LOS: 2 days    Flora Lipps, MD [facility] Available via Epic secure chat 7am-7pm After these hours, please refer to coverage provider listed on [email or web address] 11/23/2021, 11:50 AM

## 2021-11-23 NOTE — Care Management Important Message (Signed)
Important Message  Patient Details  Name: [name] MRN: [id number] Date of Birth: May 09, 1916   Medicare Important Message Given:  Yes     Hannah Beat 11/23/2021, 2:54 PM

## 2021-11-23 NOTE — TOC Initial Note (Addendum)
Transition of Care [facility]) - Initial/Assessment Note    Patient Details  Name: [name] MRN: [id number] Date of Birth: 03-Jul-1916  Transition [facility] CM/SW Contact:    Coralee Pesa, [place] Phone Number: 11/23/2021, 2:05 PM  Clinical Narrative:                 CSW met with pt and Niece, [name] at bedside. Family is agreeable to SNF with a preference for [facility] or [facility], both she has been to before. Pt lives with niece and will return when she is able. CSW will fax out referrals and provide bed offers when available. Pt is eligible for [facility] ACO REACH Waiver. TOC will continue to follow for DC needs.  4:00 Pt and family have accepted [facility] for rehab. They can take pt over the weekend if medically ready. Pt's niece wants to be here when pt is transported, TOC to keep her updated on plans. Niece asks if it is possible for pt to discharge Monday, CSW noted that is up to the medical team. TOC will continue to follow for DC needs. Expected Discharge Plan: [place] Barriers to Discharge: SNF Pending bed offer, Continued Medical Work up   Patient Goals and CMS Choice Patient states their goals for this hospitalization and ongoing recovery are:: Pt would like to go to [facility] for rehab CMS Medicare.gov Compare Post Acute Care list provided to:: Patient Represenative (must comment) [name], Niece, HCPOA) Choice offered to / list presented to : Patient, [facility] POA / Guardian  Expected Discharge Plan and Services Expected Discharge Plan: [place]       Living arrangements for the past 2 months: Single Family Home                                      Prior Living Arrangements/Services Living arrangements for the past 2 months: Single Family Home Lives with:: Self, Relatives Patient language and need for interpreter reviewed:: Yes Do you feel safe going back to the place where you live?: Yes      Need for Family Participation in Patient  Care: Yes (Comment) Care giver support system in place?: Yes (comment)   Criminal Activity/Legal Involvement Pertinent to Current Situation/Hospitalization: No - Comment as needed  Activities of Daily Living   ADL Screening (condition at time of admission) Patient's cognitive ability adequate to safely complete daily activities?: Yes Is the patient deaf or have difficulty hearing?: Yes Does the patient have difficulty seeing, even when wearing glasses/contacts?: No Does the patient have difficulty concentrating, remembering, or making decisions?: Yes Patient able to express need for assistance with ADLs?: Yes Does the patient have difficulty dressing or bathing?: Yes Independently performs ADLs?: No Communication: Appropriate for developmental age Dressing (OT): Needs assistance Is this a change from baseline?: Pre-admission baseline Grooming: Needs assistance Is this a change from baseline?: Pre-admission baseline Feeding: Needs assistance Is this a change from baseline?: Pre-admission baseline Toileting: Needs assistance Is this a change from baseline?: Pre-admission baseline In/Out Bed: Needs assistance Is this a change from baseline?: Pre-admission baseline Walks in Home: Needs assistance Is this a change from baseline?: Pre-admission baseline Does the patient have difficulty walking or climbing stairs?: Yes Weakness of Legs: Both Weakness of Arms/Hands: None  Permission Sought/Granted Permission sought to share information with : Family Supports Permission granted to share information with : Yes, Verbal Permission Granted  Share  Information with NAME: [name]     Permission granted to share info w Relationship: Niece, HCPOA     Emotional Assessment Appearance:: Appears stated age Attitude/Demeanor/Rapport: Engaged Affect (typically observed): Appropriate Orientation: : Oriented to Self, Oriented to Place, Oriented to  Time Alcohol / Substance Use: Not  Applicable Psych Involvement: No (comment)  Admission diagnosis:  UTI (urinary tract infection) [N39.0] Acute cystitis without hematuria [N30.00] Fall, initial encounter [W19.XXXA] Compression fracture of T12 vertebra, initial encounter ([place]) [Y70.623J] Patient Active Problem List   Diagnosis Date Noted   Pressure injury of skin 11/23/2021   Hypokalemia 11/23/2021   Hypomagnesemia 11/23/2021   Falls 11/22/2021   T12 compression fracture ([place]) 11/22/2021   Chronic CHF ([place]) 11/22/2021   UTI (urinary tract infection) 05/22/2020   Rectal bleeding 03/11/2020   Hematochezia 03/11/2020   Orthostatic hypotension 08/13/2019   Falls, sequela 08/13/2019   DNR (do not resuscitate) 08/11/2019   SDH (subdural hematoma) ([place]) 08/09/2019   Acute lower UTI 01/02/2018   Acute cystitis without hematuria    Acute encephalopathy 01/01/2018   DJD (degenerative joint disease) 08/09/2016   GI bleed 08/08/2016   Abnormal echocardiogram 06/14/2016   PVC's (premature ventricular contractions)    Bacteremia 09/18/2014   Fever    Febrile illness 09/12/2014   Diverticulitis 09/12/2014   CKD (chronic kidney disease) stage 3, GFR 30-59 ml/min ([place]) 09/12/2014   HTN (hypertension) 09/12/2014   Thrombocytopenia ([place]) 09/12/2014   Abnormal stress test 01/13/2014   Rectal bleed 08/10/2013   Acute renal failure ([place]) 08/10/2013   Essential hypertension, benign 01/08/2013   DOE (dyspnea on exertion) 01/08/2013   PCP:  Seward Carol, MD Pharmacy:   [facility] [place], [address] AT [address] [place] [address] Phone: [phone] Fax: [phone]     Social Determinants of Health (SDOH) Interventions    Readmission Risk Interventions    03/13/2020    4:00 PM  Readmission Risk Prevention Plan  Medication Screening Complete  Transportation Screening Complete

## 2021-11-24 DIAGNOSIS — E876 Hypokalemia: Secondary | ICD-10-CM | POA: Diagnosis not present

## 2021-11-24 DIAGNOSIS — E43 Unspecified severe protein-calorie malnutrition: Secondary | ICD-10-CM | POA: Insufficient documentation

## 2021-11-24 DIAGNOSIS — S22080A Wedge compression fracture of T11-T12 vertebra, initial encounter for closed fracture: Secondary | ICD-10-CM | POA: Diagnosis not present

## 2021-11-24 DIAGNOSIS — Z66 Do not resuscitate: Secondary | ICD-10-CM | POA: Diagnosis not present

## 2021-11-24 LAB — BASIC METABOLIC PANEL
Anion gap: 8 (ref 5–15)
BUN: 11 mg/dL (ref 8–23)
CO2: 22 mmol/L (ref 22–32)
Calcium: 8.6 mg/dL — ABNORMAL LOW (ref 8.9–10.3)
Chloride: 111 mmol/L (ref 98–111)
Creatinine, Ser: 0.83 mg/dL (ref 0.44–1.00)
GFR, Estimated: >60 mL/min (ref >60)
Glucose, Bld: 102 mg/dL — ABNORMAL HIGH (ref 70–99)
Potassium: 4.7 mmol/L (ref 3.5–5.1)
Sodium: 141 mmol/L (ref 135–145)

## 2021-11-24 LAB — CBC
HCT: 31.6 % — ABNORMAL LOW (ref 36.0–46.0)
Hemoglobin: 10.2 g/dL — ABNORMAL LOW (ref 12.0–15.0)
MCH: 30.2 pg (ref 26.0–34.0)
MCHC: 32.3 g/dL (ref 30.0–36.0)
MCV: 93.5 fL (ref 80.0–100.0)
Platelets: 169 10*3/uL (ref 150–400)
RBC: 3.38 MIL/uL — ABNORMAL LOW (ref 3.87–5.11)
RDW: 15 % (ref 11.5–15.5)
WBC: 8.1 10*3/uL (ref 4.0–10.5)
nRBC: 0 % (ref 0.0–0.2)

## 2021-11-24 LAB — MAGNESIUM: Magnesium: 2.2 mg/dL (ref 1.7–2.4)

## 2021-11-24 LAB — URINE CULTURE: Culture: >=100000 — AB

## 2021-11-24 MED ORDER — ISOSORBIDE MONONITRATE ER 30 MG PO TB24: 15.0000 mg | ORAL_TABLET | Freq: Every day | ORAL | Status: DC

## 2021-11-24 MED ORDER — LIDOCAINE 5 % EX PTCH: 1.0000 | MEDICATED_PATCH | CUTANEOUS | 0 refills | Status: DC

## 2021-11-24 MED ORDER — MAGNESIUM OXIDE -MG SUPPLEMENT 400 (240 MG) MG PO TABS: 400.0000 mg | ORAL_TABLET | Freq: Every day | ORAL | Status: DC

## 2021-11-24 MED ORDER — METOPROLOL SUCCINATE ER 25 MG PO TB24: 12.5000 mg | ORAL_TABLET | Freq: Every day | ORAL | Status: DC

## 2021-11-24 MED ORDER — CEFDINIR 300 MG PO CAPS
300.0000 mg | ORAL_CAPSULE | Freq: Every day | ORAL | Status: DC
  Administered 2021-11-24 – 2021-11-25 (×2): 300 mg via ORAL
  Filled 2021-11-24 (×2): qty 1

## 2021-11-24 NOTE — Progress Notes (Signed)
PROGRESS NOTE    [name]  VOH:[id number] DOB: 1916/05/25 DOA: 11/21/2021 PCP: Seward Carol, MD    Brief Narrative:   [name] is a 88 y.o. female with past medical history of congestive heart failure, pulmonary arterial hypertension, coronary artery disease, CKD stage III, prior GI bleed, diverticulosis, hypertension and hyperlipidemia, history of COVID-19 infection, recurrent UTI presented to hospital for frequent falls and confusion.  She had a fall with trauma to her head as well.  In the ED, initial vitals were stable.  CBC without leukocytosis hemoglobin 11.1.  Sodium 143 with potassium of 3.8.  Creatinine was 0.9.  Urinalysis showed 6-10 white cells with trace leukocytes.  Urine culture was sent from the ED.  CT head without any acute findings.  CT of the lumbar spine showed fracture through the superior endplate of I94 with multilevel degenerative changes.  Patient was then admitted hospital for further evaluation and treatment.  Assessment and plan. Principal Problem:   Falls Active Problems:   Essential hypertension, benign   DOE (dyspnea on exertion)   CKD (chronic kidney disease) stage 3, GFR 30-59 ml/min (H[facility])   DNR (do not resuscitate)   UTI (urinary tract infection)   T12 compression fracture (H[facility])   Chronic CHF (HCC)   Pressure injury of skin   Hypokalemia   Hypomagnesemia   Protein-calorie malnutrition, severe   Fall with injury  T12 compression fracture  Scalp hematoma  Probable concussion. Patient is currently on TSLO brace will ambulating.  No pain while resting.  Physical and occupational therapy has seen the patient and recommended skilled nursing facility placement.  Continue analgesia for pain.  Recurrent UTI Had increased confusion and falls at home.  .  Urine culture showing E. coli and Klebsiella..  received IV Rocephin, change to oral omnicef , plan for total of 5 days abx, last day 10/29   Mild hypokalemia.  Potassium of 3.1.   Continue to replenish orally.  Check levels in AM.    Hypomagnesemia.  We will replace orally.  Add one-time dose of 2 g of magnesium sulfate due to persistent hypokalemia.  Abdominal pain  Improved.  CT scan of the abdomen pelvis without any acute findings.On Rocephin.  Urine culture with E. coli and Klebsiella.  Double duct sign/Pancreas  Possible underlying lesion.Marland Kitchen  POA does not wish for further work-up.  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  On Lasix, amlodipine, metoprolol at home.  Hold off with Lasix today.   Essential hypertension:  On metoprolol and amlodipine at home.  Continue for now.  Blood pressure is controlled at this time.   CKD stage IIIa. We will continue to monitor creatinine.  Creatinine at baseline at 0.8   Deconditioning debility, recurrent falls.  Patient has been seen by physical and Occupational Therapy who recommended skilled nursing facility placement.      DVT prophylaxis: SCDs Start: 11/21/21 2253   Code Status:     Code Status: DNR  Disposition: Skilled nursing facility on 10/29   Family Communication: none at bedside    Consultants:  None  Procedures:  None  Antimicrobials:  Rocephin IV  Anti-infectives (From admission, onward)    Start     Dose/Rate Route Frequency Ordered Stop   11/24/21 1430  cefdinir (OMNICEF) capsule 300 mg        300 mg Oral Daily 11/24/21 1422     11/22/21 2200  cefTRIAXone (ROCEPHIN) 1 g in sodium chloride 0.9 % 100 mL IVPB  Status:  Discontinued        1 g 200 mL/hr over 30 Minutes Intravenous Every 24 hours 11/22/21 2040 11/24/21 1422   11/21/21 2045  cefTRIAXone (ROCEPHIN) 1 g in sodium chloride 0.9 % 100 mL IVPB        1 g 200 mL/hr over 30 Minutes Intravenous  Once 11/21/21 2037 11/22/21 0131      Subjective: Today, patient was seen and examined at bedside.  Complains of mild pain on the back.  Denies any nausea vomiting fever chills or rigor  Objective: Vitals:   11/24/21 0813  11/24/21 1118 11/24/21 1526 11/24/21 1944  BP: (!) 121/110 119/60 (!) 100/54 117/60  Pulse: 69 66 63 62  Resp: '15 16 17 16  '$ Temp: 97.7 F (36.5 C) 97.6 F (36.4 C) 98.1 F (36.7 C) 98.5 F (36.9 C)  TempSrc: Axillary Oral Oral Oral  SpO2: 96% 99% 97% 98%  Weight:      Height:        Intake/Output Summary (Last 24 hours) at 11/24/2021 2111 Last data filed at 11/24/2021 1950 Gross per 24 hour  Intake 480 ml  Output 450 ml  Net 30 ml   Filed Weights   11/21/21 1600 11/23/21 1603  Weight: 57.6 kg 47.6 kg    Physical Examination: Body mass index is 16.94 kg/m.   General:  not in obvious distress, elderly female, thinly built, HENT:   No scleral pallor or icterus noted. Oral mucosa is moist.  Chest:  Clear breath sounds.  Diminished breath sounds bilaterally. No crackles or wheezes.  Tenderness over the thoracic spine. CVS: S1 &S2 heard. No murmur.  Regular rate and rhythm. Abdomen: Soft, nontender, nondistended.  Bowel sounds are heard.   Extremities: No cyanosis, clubbing or edema.  Peripheral pulses are palpable. Psych: Alert, awake and Communicative. CNS:  No cranial nerve deficits.  Moves extremities. Skin: Warm and dry.  No rashes noted.  Data Reviewed:   CBC: Recent Labs  Lab 11/21/21 1612 11/21/21 1621 11/22/21 0420 11/23/21 0637 11/24/21 0225  WBC 6.7  --  9.7 8.4 8.1  NEUTROABS 3.8  --   --   --   --   HGB 11.1* 11.9* 12.1 11.8* 10.2*  HCT 35.3* 35.0* 38.1 35.7* 31.6*  MCV 94.4  --  93.8 91.8 93.5  PLT 191  --  198 197 737    Basic Metabolic Panel: Recent Labs  Lab 11/21/21 1612 11/21/21 1621 11/22/21 0420 11/23/21 0637 11/24/21 0225  NA 143 143 141 143 141  K 3.7 3.8 3.1* 3.1* 4.7  CL 107 109 108 108 111  CO2 23  --  '24 23 22  '$ GLUCOSE 101* 98 100* 83 102*  BUN '11 14 9 10 11  '$ CREATININE 0.98 0.90 0.85 0.82 0.83  CALCIUM 9.0  --  8.6* 8.9 8.6*  MG  --   --   --  1.6* 2.2    Liver Function Tests: Recent Labs  Lab 11/21/21 1612  11/22/21 0420  AST 17 23  ALT 7 8  ALKPHOS 66 71  BILITOT 0.1* 0.4  PROT 7.1 7.2  ALBUMIN 3.0* 3.0*     Radiology Studies: No results found.    LOS: 3 days    Florencia Reasons, MD PhD FACP [facility] Available via Epic secure chat 7am-7pm After these hours, please refer to coverage provider listed on [email or web address] 11/24/2021, 9:11 PM

## 2021-11-24 NOTE — Plan of Care (Signed)

## 2021-11-24 NOTE — Progress Notes (Signed)
Per MD, pt ready for dc today. Spoke to [facility] who reports their pharmacy closed at 1230 but they are able to accept pt tomorrow. MD updated. Plan for dc tomorrow.   Wandra Feinstein, MSW, LCSW [phone] (coverage)

## 2021-11-25 DIAGNOSIS — N390 Urinary tract infection, site not specified: Secondary | ICD-10-CM | POA: Diagnosis not present

## 2021-11-25 DIAGNOSIS — Z7401 Bed confinement status: Secondary | ICD-10-CM | POA: Diagnosis not present

## 2021-11-25 DIAGNOSIS — G9341 Metabolic encephalopathy: Secondary | ICD-10-CM | POA: Diagnosis not present

## 2021-11-25 DIAGNOSIS — E785 Hyperlipidemia, unspecified: Secondary | ICD-10-CM | POA: Diagnosis not present

## 2021-11-25 DIAGNOSIS — W19XXXA Unspecified fall, initial encounter: Secondary | ICD-10-CM | POA: Diagnosis not present

## 2021-11-25 DIAGNOSIS — M170 Bilateral primary osteoarthritis of knee: Secondary | ICD-10-CM | POA: Diagnosis not present

## 2021-11-25 DIAGNOSIS — E43 Unspecified severe protein-calorie malnutrition: Secondary | ICD-10-CM | POA: Diagnosis not present

## 2021-11-25 DIAGNOSIS — Z9181 History of falling: Secondary | ICD-10-CM | POA: Diagnosis not present

## 2021-11-25 DIAGNOSIS — I272 Pulmonary hypertension, unspecified: Secondary | ICD-10-CM | POA: Diagnosis not present

## 2021-11-25 DIAGNOSIS — Z87440 Personal history of urinary (tract) infections: Secondary | ICD-10-CM | POA: Diagnosis not present

## 2021-11-25 DIAGNOSIS — R296 Repeated falls: Secondary | ICD-10-CM | POA: Diagnosis not present

## 2021-11-25 DIAGNOSIS — S22080D Wedge compression fracture of T11-T12 vertebra, subsequent encounter for fracture with routine healing: Secondary | ICD-10-CM | POA: Diagnosis not present

## 2021-11-25 DIAGNOSIS — S22088A Other fracture of T11-T12 vertebra, initial encounter for closed fracture: Secondary | ICD-10-CM | POA: Diagnosis not present

## 2021-11-25 DIAGNOSIS — I5042 Chronic combined systolic (congestive) and diastolic (congestive) heart failure: Secondary | ICD-10-CM | POA: Diagnosis not present

## 2021-11-25 DIAGNOSIS — Z66 Do not resuscitate: Secondary | ICD-10-CM | POA: Diagnosis not present

## 2021-11-25 DIAGNOSIS — B961 Klebsiella pneumoniae [K. pneumoniae] as the cause of diseases classified elsewhere: Secondary | ICD-10-CM | POA: Diagnosis not present

## 2021-11-25 DIAGNOSIS — S065X9D Traumatic subdural hemorrhage with loss of consciousness of unspecified duration, subsequent encounter: Secondary | ICD-10-CM | POA: Diagnosis not present

## 2021-11-25 DIAGNOSIS — K922 Gastrointestinal hemorrhage, unspecified: Secondary | ICD-10-CM | POA: Diagnosis not present

## 2021-11-25 DIAGNOSIS — R410 Disorientation, unspecified: Secondary | ICD-10-CM | POA: Diagnosis not present

## 2021-11-25 DIAGNOSIS — E876 Hypokalemia: Secondary | ICD-10-CM | POA: Diagnosis not present

## 2021-11-25 DIAGNOSIS — L89102 Pressure ulcer of unspecified part of back, stage 2: Secondary | ICD-10-CM | POA: Diagnosis not present

## 2021-11-25 DIAGNOSIS — I5040 Unspecified combined systolic (congestive) and diastolic (congestive) heart failure: Secondary | ICD-10-CM | POA: Diagnosis not present

## 2021-11-25 DIAGNOSIS — S22080A Wedge compression fracture of T11-T12 vertebra, initial encounter for closed fracture: Secondary | ICD-10-CM | POA: Diagnosis not present

## 2021-11-25 DIAGNOSIS — N179 Acute kidney failure, unspecified: Secondary | ICD-10-CM | POA: Diagnosis not present

## 2021-11-25 DIAGNOSIS — S0003XA Contusion of scalp, initial encounter: Secondary | ICD-10-CM | POA: Diagnosis not present

## 2021-11-25 DIAGNOSIS — I959 Hypotension, unspecified: Secondary | ICD-10-CM | POA: Diagnosis not present

## 2021-11-25 DIAGNOSIS — D696 Thrombocytopenia, unspecified: Secondary | ICD-10-CM | POA: Diagnosis not present

## 2021-11-25 DIAGNOSIS — B9620 Unspecified Escherichia coli [E. coli] as the cause of diseases classified elsewhere: Secondary | ICD-10-CM | POA: Diagnosis not present

## 2021-11-25 DIAGNOSIS — I2510 Atherosclerotic heart disease of native coronary artery without angina pectoris: Secondary | ICD-10-CM | POA: Diagnosis not present

## 2021-11-25 DIAGNOSIS — N1831 Chronic kidney disease, stage 3a: Secondary | ICD-10-CM | POA: Diagnosis not present

## 2021-11-25 DIAGNOSIS — K5900 Constipation, unspecified: Secondary | ICD-10-CM | POA: Diagnosis not present

## 2021-11-25 DIAGNOSIS — I10 Essential (primary) hypertension: Secondary | ICD-10-CM | POA: Diagnosis not present

## 2021-11-25 DIAGNOSIS — N183 Chronic kidney disease, stage 3 unspecified: Secondary | ICD-10-CM | POA: Diagnosis not present

## 2021-11-25 DIAGNOSIS — I2721 Secondary pulmonary arterial hypertension: Secondary | ICD-10-CM | POA: Diagnosis not present

## 2021-11-25 MED ORDER — ISOSORBIDE MONONITRATE ER 30 MG PO TB24: 15.0000 mg | ORAL_TABLET | Freq: Every day | ORAL | Status: DC

## 2021-11-25 MED ORDER — METOPROLOL SUCCINATE ER 25 MG PO TB24: 12.5000 mg | ORAL_TABLET | Freq: Every day | ORAL | Status: AC

## 2021-11-25 NOTE — Discharge Summary (Signed)
Discharge Summary  [name] TKZ:[id number] DOB: July 11, 1916  PCP: Seward Carol, MD  Admit date: 11/21/2021 Discharge date: 11/25/2021  Time spent: 3mns, more than 50% time spent on coordination of care.   Recommendations for Outpatient Follow-up:  F/u with SNF for hospital discharge follow up, repeat cbc/bmp at follow up  Discharge Diagnoses:  Active Hospital Problems   Diagnosis Date Noted   Falls 11/22/2021   Protein-calorie malnutrition, severe 11/24/2021   Pressure injury of skin 11/23/2021   Hypokalemia 11/23/2021   Hypomagnesemia 11/23/2021   T12 compression fracture ([facility] 11/22/2021   Chronic CHF ([facility] 11/22/2021   UTI (urinary tract infection) 05/22/2020   DNR (do not resuscitate) 08/11/2019   CKD (chronic kidney disease) stage 3, GFR 30-59 ml/min ([facility] 09/12/2014   Essential hypertension, benign 01/08/2013   DOE (dyspnea on exertion) 01/08/2013    Resolved Hospital Problems  No resolved problems to display.    Discharge Condition: stable  Diet recommendation: heart healthy  Filed Weights   11/21/21 1600 11/23/21 1603 11/25/21 0500  Weight: 57.6 kg 47.6 kg 48.9 kg    History of present illness: ( per admitting MD Dr TMarcello Moores Chief Complaint:  fall with injury/ confusion x 1 week    HPI: Morgan MTOULA MIYASAKIis a 88y.o. female with medical history significant for combined CHF, PAH, CAD, CKD-3, prior GI bleed, diverticulosis, HTN and HLD covid 19 ( 8/23 with recovery).recurrent UTI, who over the last week per family has had more frequent falls, as well as noted mild confusion. Patient today had fall at home which resulted in injury.  Patient  fell back wards ,hitting her head and as well as her back.   Due to this patient was brought in for evaluation. Patient in ED had complaint of back, Head and abdominal pain. Patient currently states her pain is well controlled at the moment. When asked about her abdominal pain noted that she has no pain currently  and states at time its worse with movement. She notes no  n/v/d/sob/ chest pain/ fever or chills. Of noted  GDuwaine Maxin(Relative) [phone] ([facility] she request that all medical information discussed solely with her.   ED Course:  Vitals :afeb,  135/82, hr 79, rr 18 hr 71  sat 94% on ra  Labs: Wbc 6.7, hgb 11.1,plt 191 Na 143, K 3.7, glu 101, cr 0.98,  Lipase 32 CE Cr11 Cxr: NAD Xr pelvis:1. Stable bilateral hip osteoarthritis.  Non- displaced fracture. CTH:No acute intracranial abnormality. Atrophy and chronic microvascular ischemic change in the white matter.  UA + Cervical spondylosis. Negative for fracture.   CT Lumbar spine: IMPRESSION: 1. Acute fracture through the anterior superior endplate of TT73 involving the anterior osteophytes, without retropulsion. No additional fracture is seen in the thoracic or lumbar spine, although evaluation is limited by osteopenia. This could be further evaluated with MRI if clinically indicated. 2. Multilevel degenerative changes in the thoracic and lumbar spine, as described above. 3. For soft tissue findings, please see same-day CT chest abdomen pelvis.   CT abd; 1. No acute traumatic injury to the chest, abdomen, or pelvis.   2. Please see separately dictated CT thoracolumbar spine 11/21/2021. 3. Other imaging findings of potential clinical significance: At least moderate volume hiatal hernia. 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 Colonic diverticulosis with no acute diverticulitis. Review of Systems: As per HPI otherwise 10 point review of systems negative.     Hospital Course:  Principal Problem:  Falls Active Problems:   Essential hypertension, benign   DOE (dyspnea on exertion)   CKD (chronic kidney disease) stage 3, GFR 30-59 ml/min (HCC)   DNR (do not resuscitate)   UTI (urinary tract infection)   T12 compression fracture (HCC)   Chronic CHF (HCC)   Pressure injury of skin    Hypokalemia   Hypomagnesemia   Protein-calorie malnutrition, severe   Assessment and Plan:  Fall with injury  T12 compression fracture  Scalp hematoma  Probable concussion. Patient is currently on TSLO brace will ambulating.  topical lidocaine patch for pain.   Recurrent UTI Had increased confusion and falls at home.   Urine culture showing E. coli and Klebsiella..  finished 5 day abx treatment in the hospital.    Abdominal pain  Improved.  CT scan of the abdomen pelvis without any acute findings.On Rocephin.  Urine culture with E. coli and Klebsiella. Having bm   Double duct sign/Pancreas  Lipase wnl Possible underlying lesion.Marland Kitchen  POA does not wish for further work-up.  Mild hypokalemia.  replaced   Hypomagnesemia.  replaced      Chronic combined congestive heart failure with PAS.  continue all home meds, euvolemic at discharge    Essential hypertension:  Bp stable on home regimen, continue    CKD stage IIIa. Stable, Creatinine at baseline at 0.8   Deconditioning debility, recurrent falls. Snf placement .       Discharge Exam: BP (!) 148/70 (BP Location: Left Arm)   Pulse 66   Temp 98.2 F (36.8 C) (Oral)   Resp 17   Ht '5\' 6"'$  (1.676 m)   Wt 48.9 kg   SpO2 98%   BMI 17.40 kg/m   General: NAD, AAOX3 Cardiovascular: RRR Respiratory: normal respiratory effort     Discharge Instructions     Diet general   Complete by: As directed    Discharge wound care:   Complete by: As directed    Pressure offloading measures   Increase activity slowly   Complete by: As directed       Allergies as of 11/25/2021       Reactions   Ace Inhibitors Hives, Itching   Streptomycin Itching, Other (See Comments)   Pruritus   Tramadol Other (See Comments)   unknown   Vesicare [solifenacin Succinate] Itching   Penicillins Rash   Pruritic rash        Medication List     TAKE these medications    acetaminophen 325 MG tablet Commonly known as: TYLENOL Take  325-650 mg by mouth every 6 (six) hours as needed for mild pain.   amLODipine 2.5 MG tablet Commonly known as: NORVASC Take 2.5 mg by mouth daily.   furosemide 40 MG tablet Commonly known as: LASIX Take 20 mg by mouth See admin instructions. MON WED FRI SAT What changed: Another medication with the same name was removed. Continue taking this medication, and follow the directions you see here.   isosorbide mononitrate 30 MG 24 hr tablet Commonly known as: IMDUR Take 0.5 tablets (15 mg total) by mouth daily. Start taking on: November 26, 2021 What changed: how much to take   lidocaine 5 % Commonly known as: LIDODERM Place 1 patch onto the skin daily. Remove & Discard patch within 12 hours or as directed by MD   loperamide 2 MG capsule Commonly known as: IMODIUM Take 2 mg by mouth every Monday, Wednesday, and Friday.   magnesium oxide 400 (240 Mg) MG tablet Commonly known  as: MAG-OX Take 1 tablet (400 mg total) by mouth daily.   meclizine 25 MG tablet Commonly known as: ANTIVERT Take 25 mg by mouth 2 (two) times daily as needed for dizziness.   metoprolol succinate 25 MG 24 hr tablet Commonly known as: TOPROL-XL Take 0.5 tablets (12.5 mg total) by mouth daily. Start taking on: November 26, 2021 What changed:  how much to take when to take this   pantoprazole 40 MG tablet Commonly known as: PROTONIX Take 40 mg by mouth every Monday, Wednesday, and Friday.   PARoxetine 10 MG tablet Commonly known as: PAXIL Take 10 mg by mouth daily.   polyethylene glycol 17 g packet Commonly known as: MiraLax Take 17 g by mouth daily as needed for mild constipation.   potassium chloride SA 20 MEQ tablet Commonly known as: KLOR-CON M Take 20 mEq by mouth daily. What changed: Another medication with the same name was removed. Continue taking this medication, and follow the directions you see here.   Premarin 0.3 MG tablet Generic drug: estrogens (conjugated) Take 0.15 mg by mouth  daily.   prenatal multivitamin Tabs tablet Take 1 tablet by mouth daily at 12 noon.   Systane Balance 0.6 % Soln Generic drug: Propylene Glycol Place 1 drop into both eyes daily as needed (dry eyes).   traZODone 50 MG tablet Commonly known as: DESYREL Take 25-50 mg by mouth at bedtime as needed for sleep.               Discharge Care Instructions  (From admission, onward)           Start     Ordered   11/24/21 0000  Discharge wound care:       Comments: Pressure offloading measures   11/24/21 1421           Allergies  Allergen Reactions   Ace Inhibitors Hives and Itching   Streptomycin Itching and Other (See Comments)    Pruritus    Tramadol Other (See Comments)    unknown   Vesicare [Solifenacin Succinate] Itching   Penicillins Rash    Pruritic rash    Contact information for after-discharge care     Destination     [facility] Preferred SNF .   Service: Skilled Nursing Contact information: [place] [phone]                      The results of significant diagnostics from this hospitalization (including imaging, microbiology, ancillary and laboratory) are listed below for reference.    Significant Diagnostic Studies: CT L-SPINE NO CHARGE  Result Date: 11/21/2021 CLINICAL DATA:  Fall on blood thinners EXAM: CT Thoracic and Lumbar spine with contrast TECHNIQUE: Multiplanar CT images of the thoracic and lumbar spine were reconstructed from contemporary CT of the Chest, Abdomen, and Pelvis. RADIATION DOSE REDUCTION: This exam was performed according to the departmental dose-optimization program which includes automated exposure control, adjustment of the mA and/or kV according to patient size and/or use of iterative reconstruction technique. CONTRAST:  No additional contrast given, 75 mL Omnipaque 350 given for concurrent CT chest abdomen pelvis COMPARISON:  CT abdomen  pelvis 05/22/2020, thoracic spine radiographs 09/05/2016 FINDINGS: CT THORACIC SPINE FINDINGS Alignment: S shaped curvature of the thoracolumbar spine. Preservation of the normal thoracic kyphosis. No listhesis. Vertebrae: Acute fracture through the anterior superior endplate of Z85 (series 5, image 44), involving the anterior osteophytes. Flowing osteophytes are noted T10-L2, with  focal disruption at T 12. Osseous fusion of T12-L2. No definite retropulsion of the posterior cortex of T12, although there are large posterior osteophytes at T12-L1; these appear unchanged compared to 05/22/2020. No other acute fracture or suspicious osseous lesion. Paraspinal and other soft tissues: Please see same-day CT chest abdomen pelvis. Disc levels: Multilevel degenerative changes. Small disc osteophyte formations at T8-T9 and T9-T10. No high-grade spinal canal stenosis. Mild left neural foraminal narrowing at T9-T10 and T11-T12. CT LUMBAR SPINE FINDINGS Segmentation: 5 lumbar-type vertebral bodies. Alignment: S shaped curvature of the thoracolumbar spine, with dextrocurvature of the thoracolumbar junction and compensatory levocurvature of the lower lumbar spine. 3 mm retrolisthesis of L2 on L3. 6 mm retrolisthesis L3 on L4. 3 mm retrolisthesis L4 on L5. Vertebrae: As described above, there is an acute fracture through the anterosuperior aspect of T12, involving the large flowing anterior osteophytes, which extend to the level of L2. No additional fracture is seen, although evaluation is limited by degree of osteopenia. Osseous fusion of T12-L2. Additional acute fracture is seen. No suspicious osseous lesion. Paraspinal and other soft tissues: Please see same-day CT chest abdomen pelvis. Disc levels: T12-L1: Osseous fusion across the disc space with large circumferential osteophytes. Severe left and mild right facet arthropathy. Moderate to severe spinal canal stenosis. No neural foraminal narrowing. L1-L2: Osseous fusion across  the disc space with circumferential osteophytes. Moderate facet arthropathy. Moderate spinal canal stenosis. Mild bilateral neural foraminal narrowing. L2-L3: Trace retrolisthesis and disc osteophyte complex. Moderate facet arthropathy. Moderate spinal canal stenosis. Moderate left neural foraminal narrowing. L3-L4: Retrolisthesis. Small posterior disc osteophyte complex. Moderate facet arthropathy. No spinal canal stenosis. Moderate bilateral neural foraminal narrowing. L4-L5: Trace retrolisthesis and moderate disc bulge. Moderate to severe facet arthropathy. Mild spinal canal stenosis. Moderate bilateral neural foraminal narrowing. L5-S1: Disc height loss and disc osteophyte complex. Moderate facet arthropathy. No spinal canal stenosis. Severe left and mild right neural foraminal narrowing. IMPRESSION: 1. Acute fracture through the anterior superior endplate of D17, involving the anterior osteophytes, without retropulsion. No additional fracture is seen in the thoracic or lumbar spine, although evaluation is limited by osteopenia. This could be further evaluated with MRI if clinically indicated. 2. Multilevel degenerative changes in the thoracic and lumbar spine, as described above. 3. For soft tissue findings, please see same-day CT chest abdomen pelvis. These results were called by telephone at the time of interpretation on 11/21/2021 at 7:16 pm to provider [facility] , who verbally acknowledged these results. Electronically Signed   By: Merilyn Baba M.D.   On: 11/21/2021 19:20   CT T-SPINE NO CHARGE  Result Date: 11/21/2021 CLINICAL DATA:  Fall on blood thinners EXAM: CT Thoracic and Lumbar spine with contrast TECHNIQUE: Multiplanar CT images of the thoracic and lumbar spine were reconstructed from contemporary CT of the Chest, Abdomen, and Pelvis. RADIATION DOSE REDUCTION: This exam was performed according to the departmental dose-optimization program which includes automated exposure control,  adjustment of the mA and/or kV according to patient size and/or use of iterative reconstruction technique. CONTRAST:  No additional contrast given, 75 mL Omnipaque 350 given for concurrent CT chest abdomen pelvis COMPARISON:  CT abdomen pelvis 05/22/2020, thoracic spine radiographs 09/05/2016 FINDINGS: CT THORACIC SPINE FINDINGS Alignment: S shaped curvature of the thoracolumbar spine. Preservation of the normal thoracic kyphosis. No listhesis. Vertebrae: Acute fracture through the anterior superior endplate of O16 (series 5, image 44), involving the anterior osteophytes. Flowing osteophytes are noted T10-L2, with focal disruption at T 12. Osseous fusion of  T12-L2. No definite retropulsion of the posterior cortex of T12, although there are large posterior osteophytes at T12-L1; these appear unchanged compared to 05/22/2020. No other acute fracture or suspicious osseous lesion. Paraspinal and other soft tissues: Please see same-day CT chest abdomen pelvis. Disc levels: Multilevel degenerative changes. Small disc osteophyte formations at T8-T9 and T9-T10. No high-grade spinal canal stenosis. Mild left neural foraminal narrowing at T9-T10 and T11-T12. CT LUMBAR SPINE FINDINGS Segmentation: 5 lumbar-type vertebral bodies. Alignment: S shaped curvature of the thoracolumbar spine, with dextrocurvature of the thoracolumbar junction and compensatory levocurvature of the lower lumbar spine. 3 mm retrolisthesis of L2 on L3. 6 mm retrolisthesis L3 on L4. 3 mm retrolisthesis L4 on L5. Vertebrae: As described above, there is an acute fracture through the anterosuperior aspect of T12, involving the large flowing anterior osteophytes, which extend to the level of L2. No additional fracture is seen, although evaluation is limited by degree of osteopenia. Osseous fusion of T12-L2. Additional acute fracture is seen. No suspicious osseous lesion. Paraspinal and other soft tissues: Please see same-day CT chest abdomen pelvis. Disc  levels: T12-L1: Osseous fusion across the disc space with large circumferential osteophytes. Severe left and mild right facet arthropathy. Moderate to severe spinal canal stenosis. No neural foraminal narrowing. L1-L2: Osseous fusion across the disc space with circumferential osteophytes. Moderate facet arthropathy. Moderate spinal canal stenosis. Mild bilateral neural foraminal narrowing. L2-L3: Trace retrolisthesis and disc osteophyte complex. Moderate facet arthropathy. Moderate spinal canal stenosis. Moderate left neural foraminal narrowing. L3-L4: Retrolisthesis. Small posterior disc osteophyte complex. Moderate facet arthropathy. No spinal canal stenosis. Moderate bilateral neural foraminal narrowing. L4-L5: Trace retrolisthesis and moderate disc bulge. Moderate to severe facet arthropathy. Mild spinal canal stenosis. Moderate bilateral neural foraminal narrowing. L5-S1: Disc height loss and disc osteophyte complex. Moderate facet arthropathy. No spinal canal stenosis. Severe left and mild right neural foraminal narrowing. IMPRESSION: 1. Acute fracture through the anterior superior endplate of G62, involving the anterior osteophytes, without retropulsion. No additional fracture is seen in the thoracic or lumbar spine, although evaluation is limited by osteopenia. This could be further evaluated with MRI if clinically indicated. 2. Multilevel degenerative changes in the thoracic and lumbar spine, as described above. 3. For soft tissue findings, please see same-day CT chest abdomen pelvis. These results were called by telephone at the time of interpretation on 11/21/2021 at 7:16 pm to provider [facility] , who verbally acknowledged these results. Electronically Signed   By: Merilyn Baba M.D.   On: 11/21/2021 19:20   CT CHEST ABDOMEN PELVIS W CONTRAST  Result Date: 11/21/2021 CLINICAL DATA:  Polytrauma, blunt.  Fall EXAM: CT CHEST, ABDOMEN, AND PELVIS WITH CONTRAST TECHNIQUE: Multidetector CT imaging  of the chest, abdomen and pelvis was performed following the standard protocol during bolus administration of intravenous contrast. RADIATION DOSE REDUCTION: This exam was performed according to the departmental dose-optimization program which includes automated exposure control, adjustment of the mA and/or kV according to patient size and/or use of iterative reconstruction technique. CONTRAST:  55m OMNIPAQUE IOHEXOL 350 MG/ML SOLN COMPARISON:  None Available. FINDINGS: CHEST: Cardiovascular: No aortic injury. The thoracic aorta is normal in caliber. The heart is normal in size. No significant pericardial effusion. Atherosclerotic plaque. Mediastinum/Nodes: No pneumomediastinum. No mediastinal hematoma. The esophagus is unremarkable. At least moderate volume hiatal hernia. The thyroid is unremarkable. The central airways are patent. No mediastinal, hilar, or axillary lymphadenopathy. Lungs/Pleura: Bilateral lower lobe subsegmental atelectasis. No focal consolidation. No pulmonary nodule. No pulmonary mass. No pulmonary  contusion or laceration. No pneumatocele formation. No pleural effusion. No pneumothorax. No hemothorax. Musculoskeletal/Chest wall: No chest wall mass. No acute rib or sternal fracture. Please see separately dictated CT thoracolumbar spine 11/21/2021. Degenerative Changes of the partially visualized right upper extremity. ABDOMEN / PELVIS: Hepatobiliary: Not enlarged. No focal lesion. No laceration or subcapsular hematoma. The gallbladder is otherwise unremarkable with no radio-opaque gallstones. Intra and extrahepatic biliary ductal dilatation. Pancreas: Normal pancreatic contour. Main pancreatic duct dilatation measuring up to 6 mm. Spleen: Not enlarged. No focal lesion. No laceration, subcapsular hematoma, or vascular injury. Adrenals/Urinary Tract: No nodularity bilaterally. Bilateral kidneys enhance symmetrically. No hydronephrosis. No contusion, laceration, or subcapsular hematoma.  Subcentimeter hypodensities are too small to characterize. No injury to the vascular structures or collecting systems. No hydroureter. The urinary bladder is unremarkable. Stomach/Bowel: No small or large bowel wall thickening or dilatation. The appendix is not definitely identified with no inflammatory changes in the right lower quadrant to suggest acute appendicitis. Vasculature/Lymphatics: No abdominal aorta or iliac aneurysm. No active contrast extravasation or pseudoaneurysm. No abdominal, pelvic, inguinal lymphadenopathy. Reproductive: Status post hysterectomy.  No adnexal masses. Other: No simple free fluid ascites. No pneumoperitoneum. No hemoperitoneum. No mesenteric hematoma identified. No organized fluid collection. Musculoskeletal: No significant soft tissue hematoma. No acute pelvic fracture. Please see separately dictated CT thoracolumbar spine 11/21/2021. Ports and Devices: None. IMPRESSION: 1. No acute traumatic injury to the chest, abdomen, or pelvis. 2. Please see separately dictated CT thoracolumbar spine 11/21/2021. 3. Other imaging findings of potential clinical significance: At least moderate volume hiatal hernia. 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 Colonic diverticulosis with no acute diverticulitis. Electronically Signed   By: Iven Finn M.D.   On: 11/21/2021 18:15   CT Head Wo Contrast  Result Date: 11/21/2021 CLINICAL DATA:  Fall.  On blood thinner EXAM: CT HEAD WITHOUT CONTRAST CT CERVICAL SPINE WITHOUT CONTRAST TECHNIQUE: Multidetector CT imaging of the head and cervical spine was performed following the standard protocol without intravenous contrast. Multiplanar CT image reconstructions of the cervical spine were also generated. RADIATION DOSE REDUCTION: This exam was performed according to the departmental dose-optimization program which includes automated exposure control, adjustment of the mA and/or kV according to patient size and/or use of  iterative reconstruction technique. COMPARISON:  CT head 08/11/2019 FINDINGS: CT HEAD FINDINGS Brain: Motion degraded study. Generalized atrophy. Chronic microvascular ischemic change in the white matter. Interval resolution of subdural hematoma on the left Negative for acute infarct, hemorrhage, mass Vascular: Negative for hyperdense vessel Skull: Negative Sinuses/Orbits: Mild mucosal edema paranasal sinuses. Bilateral cataract extraction Other: None CT CERVICAL SPINE FINDINGS Alignment: Mild anterolisthesis C3-4 and C4-5 and C7-T1 Skull base and vertebrae: Negative for fracture. Cystic endplate changes at K2-4 and C6-7 as noted in 2018 CT. Soft tissues and spinal canal: Negative for soft tissue mass or swelling Disc levels: Advanced spondylosis. Multilevel disc and facet degeneration. Foraminal narrowing bilaterally at multiple levels due to spurring. Central canal patent. Upper chest: Lung apices clear bilaterally Other: None IMPRESSION: No acute intracranial abnormality. Atrophy and chronic microvascular ischemic change in the white matter. Cervical spondylosis. Negative for fracture. Electronically Signed   By: Franchot Gallo M.D.   On: 11/21/2021 18:08   CT Cervical Spine Wo Contrast  Result Date: 11/21/2021 CLINICAL DATA:  Fall.  On blood thinner EXAM: CT HEAD WITHOUT CONTRAST CT CERVICAL SPINE WITHOUT CONTRAST TECHNIQUE: Multidetector CT imaging of the head and cervical spine was performed following the standard protocol without intravenous contrast.  Multiplanar CT image reconstructions of the cervical spine were also generated. RADIATION DOSE REDUCTION: This exam was performed according to the departmental dose-optimization program which includes automated exposure control, adjustment of the mA and/or kV according to patient size and/or use of iterative reconstruction technique. COMPARISON:  CT head 08/11/2019 FINDINGS: CT HEAD FINDINGS Brain: Motion degraded study. Generalized atrophy. Chronic  microvascular ischemic change in the white matter. Interval resolution of subdural hematoma on the left Negative for acute infarct, hemorrhage, mass Vascular: Negative for hyperdense vessel Skull: Negative Sinuses/Orbits: Mild mucosal edema paranasal sinuses. Bilateral cataract extraction Other: None CT CERVICAL SPINE FINDINGS Alignment: Mild anterolisthesis C3-4 and C4-5 and C7-T1 Skull base and vertebrae: Negative for fracture. Cystic endplate changes at Z6-1 and C6-7 as noted in 2018 CT. Soft tissues and spinal canal: Negative for soft tissue mass or swelling Disc levels: Advanced spondylosis. Multilevel disc and facet degeneration. Foraminal narrowing bilaterally at multiple levels due to spurring. Central canal patent. Upper chest: Lung apices clear bilaterally Other: None IMPRESSION: No acute intracranial abnormality. Atrophy and chronic microvascular ischemic change in the white matter. Cervical spondylosis. Negative for fracture. Electronically Signed   By: Franchot Gallo M.D.   On: 11/21/2021 18:08   DG Pelvis Portable  Result Date: 11/21/2021 CLINICAL DATA:  Neck and back pain after fall EXAM: PORTABLE PELVIS 1-2 VIEWS COMPARISON:  07/23/2018 FINDINGS: Supine frontal view of the pelvis includes both hips. No acute displaced fracture, subluxation, or dislocation. There is severe bilateral hip osteoarthritis again noted. The sacroiliac joints are unremarkable. Soft tissues are normal. IMPRESSION: 1. Stable bilateral hip osteoarthritis.  No displaced fracture. Electronically Signed   By: Randa Ngo M.D.   On: 11/21/2021 16:21   DG Chest Portable 1 View  Result Date: 11/21/2021 CLINICAL DATA:  Neck and back pain after fall, diffuse abdominal pain EXAM: PORTABLE CHEST 1 VIEW COMPARISON:  04/14/2021 FINDINGS: Single frontal view of the chest demonstrates an unremarkable cardiac silhouette. Continued ectasia of the thoracic aorta. No acute airspace disease, effusion, or pneumothorax. Multilevel  thoracolumbar spondylosis. No evidence of acute fracture. IMPRESSION: 1. No acute intrathoracic process. Electronically Signed   By: Randa Ngo M.D.   On: 11/21/2021 16:20    Microbiology: Recent Results (from the past 240 hour(s))  Urine Culture     Status: Abnormal   Collection Time: 11/21/21 10:00 PM   Specimen: Urine, Clean Catch  Result Value Ref Range Status   Specimen Description URINE, CLEAN CATCH  Final   Special Requests   Final    NONE Performed at [facility], [address], [place], [place] [address]    Culture (A)  Final    >=100,000 COLONIES/mL ESCHERICHIA COLI >=100,000 COLONIES/mL KLEBSIELLA PNEUMONIAE    Report Status 11/24/2021 FINAL  Final   Organism ID, Bacteria ESCHERICHIA COLI (A)  Final   Organism ID, Bacteria KLEBSIELLA PNEUMONIAE (A)  Final      Susceptibility   Escherichia coli - MIC*    AMPICILLIN >=32 RESISTANT Resistant     CEFAZOLIN <=4 SENSITIVE Sensitive     CEFEPIME <=0.12 SENSITIVE Sensitive     CEFTRIAXONE <=0.25 SENSITIVE Sensitive     CIPROFLOXACIN >=4 RESISTANT Resistant     GENTAMICIN <=1 SENSITIVE Sensitive     IMIPENEM <=0.25 SENSITIVE Sensitive     NITROFURANTOIN <=16 SENSITIVE Sensitive     TRIMETH/SULFA <=20 SENSITIVE Sensitive     AMPICILLIN/SULBACTAM >=32 RESISTANT Resistant     PIP/TAZO <=4 SENSITIVE Sensitive     * >=100,000  COLONIES/mL ESCHERICHIA COLI   Klebsiella pneumoniae - MIC*    AMPICILLIN >=32 RESISTANT Resistant     CEFAZOLIN <=4 SENSITIVE Sensitive     CEFEPIME <=0.12 SENSITIVE Sensitive     CEFTRIAXONE <=0.25 SENSITIVE Sensitive     CIPROFLOXACIN <=0.25 SENSITIVE Sensitive     GENTAMICIN <=1 SENSITIVE Sensitive     IMIPENEM 0.5 SENSITIVE Sensitive     NITROFURANTOIN 64 INTERMEDIATE Intermediate     TRIMETH/SULFA <=20 SENSITIVE Sensitive     AMPICILLIN/SULBACTAM 16 INTERMEDIATE Intermediate     PIP/TAZO <=4 SENSITIVE Sensitive     * >=100,000 COLONIES/mL KLEBSIELLA PNEUMONIAE     Labs: Basic  Metabolic Panel: Recent Labs  Lab 11/21/21 1612 11/21/21 1621 11/22/21 0420 11/23/21 0637 11/24/21 0225  NA 143 143 141 143 141  K 3.7 3.8 3.1* 3.1* 4.7  CL 107 109 108 108 111  CO2 23  --  '24 23 22  '$ GLUCOSE 101* 98 100* 83 102*  BUN '11 14 9 10 11  '$ CREATININE 0.98 0.90 0.85 0.82 0.83  CALCIUM 9.0  --  8.6* 8.9 8.6*  MG  --   --   --  1.6* 2.2   Liver Function Tests: Recent Labs  Lab 11/21/21 1612 11/22/21 0420  AST 17 23  ALT 7 8  ALKPHOS 66 71  BILITOT 0.1* 0.4  PROT 7.1 7.2  ALBUMIN 3.0* 3.0*   Recent Labs  Lab 11/21/21 1612  LIPASE 32   No results for input(s): "AMMONIA" in the last 168 hours. CBC: Recent Labs  Lab 11/21/21 1612 11/21/21 1621 11/22/21 0420 11/23/21 0637 11/24/21 0225  WBC 6.7  --  9.7 8.4 8.1  NEUTROABS 3.8  --   --   --   --   HGB 11.1* 11.9* 12.1 11.8* 10.2*  HCT 35.3* 35.0* 38.1 35.7* 31.6*  MCV 94.4  --  93.8 91.8 93.5  PLT 191  --  198 197 169   Cardiac Enzymes: No results for input(s): "CKTOTAL", "CKMB", "CKMBINDEX", "TROPONINI" in the last 168 hours. BNP: BNP (last 3 results) No results for input(s): "BNP" in the last 8760 hours.  ProBNP (last 3 results) No results for input(s): "PROBNP" in the last 8760 hours.  CBG: No results for input(s): "GLUCAP" in the last 168 hours.  FURTHER DISCHARGE INSTRUCTIONS:   Get Medicines reviewed and adjusted: Please take all your medications with you for your next visit with your Primary MD   Laboratory/radiological data: Please request your Primary MD to go over all hospital tests and procedure/radiological results at the follow up, please ask your Primary MD to get all Hospital records sent to his/her office.   In some cases, they will be blood work, cultures and biopsy results pending at the time of your discharge. Please request that your primary care M.D. goes through all the records of your hospital data and follows up on these results.   Also Note the following: If you  experience worsening of your admission symptoms, develop shortness of breath, life threatening emergency, suicidal or homicidal thoughts you must seek medical attention immediately by calling 911 or calling your MD immediately  if symptoms less severe.   You must read complete instructions/literature along with all the possible adverse reactions/side effects for all the Medicines you take and that have been prescribed to you. Take any new Medicines after you have completely understood and accpet all the possible adverse reactions/side effects.    Do not drive when taking Pain medications or sleeping medications (Benzodaizepines)  Do not take more than prescribed Pain, Sleep and Anxiety Medications. It is not advisable to combine anxiety,sleep and pain medications without talking with your primary care practitioner   Special Instructions: If you have smoked or chewed Tobacco  in the last 2 yrs please stop smoking, stop any regular Alcohol  and or any Recreational drug use.   Wear Seat belts while driving.   Please note: You were cared for by a hospitalist during your hospital stay. Once you are discharged, your primary care physician will handle any further medical issues. Please note that NO REFILLS for any discharge medications will be authorized once you are discharged, as it is imperative that you return to your primary care physician (or establish a relationship with a primary care physician if you do not have one) for your post hospital discharge needs so that they can reassess your need for medications and monitor your lab values.     Signed:  Florencia Reasons MD, PhD, FACP  [facility] 11/25/2021, 10:49 AM

## 2021-11-25 NOTE — Progress Notes (Signed)
Spoke to April with [facility] who confirmed they are prepared to admit pt today but will need dc summary before 1130. MD updated.   Wandra Feinstein, MSW, LCSW [phone] (coverage)

## 2021-11-25 NOTE — TOC Transition Note (Signed)
Transition of Care [facility]) - CM/SW Discharge Note   Patient Details  Name: [name] MRN: [id number] Date of Birth: [birth year]  [facility] CM/SW Contact:  Amador Cunas, LCSW Phone Number: 11/25/2021, 11:07 AM   Clinical Narrative:  Pt for dc to [facility] today. Spoke to April at [facility] who confirmed dc summary received and they are prepared to admit pt to room 206. Pt's niece [name] aware of dc and reports agreeable.   PTAR arranged for transport.   RN to call report to [facility] [phone]. SW signing off at dc.   Wandra Feinstein, MSW, LCSW [phone] (coverage)      Final next level of care: Skilled Nursing Facility Barriers to Discharge: No Barriers Identified   Patient Goals and CMS Choice Patient states their goals for this hospitalization and ongoing recovery are:: Pt would like to go to [facility] for rehab CMS Medicare.gov Compare Post Acute Care list provided to:: Patient Represenative (must comment) [name], Niece, HCPOA) Choice offered to / list presented to :  (niece)  Discharge Placement              Patient chooses bed at: [facility], [facility] Patient to be transferred to facility by: [place] Name of family member notified: [place] Patient and family notified of of transfer: 11/25/21  Discharge Plan and Services                                     Social Determinants of Health (SDOH) Interventions     Readmission Risk Interventions    03/13/2020    4:00 PM  Readmission Risk Prevention Plan  Medication Screening Complete  Transportation Screening Complete

## 2021-11-26 DIAGNOSIS — I5040 Unspecified combined systolic (congestive) and diastolic (congestive) heart failure: Secondary | ICD-10-CM | POA: Diagnosis not present

## 2021-11-26 DIAGNOSIS — R296 Repeated falls: Secondary | ICD-10-CM | POA: Diagnosis not present

## 2021-11-26 DIAGNOSIS — S22080A Wedge compression fracture of T11-T12 vertebra, initial encounter for closed fracture: Secondary | ICD-10-CM | POA: Diagnosis not present

## 2021-11-26 DIAGNOSIS — I2721 Secondary pulmonary arterial hypertension: Secondary | ICD-10-CM | POA: Diagnosis not present

## 2021-11-27 ENCOUNTER — Other Ambulatory Visit: Payer: Self-pay | Admitting: *Deleted

## 2021-11-27 NOTE — Patient Outreach (Signed)
[name] admitted to [facility]. [facility] ACO [place] 3-Day SNF Waiver utilized. Screening for potential [facility] care coordination services as benefit of insurance plan and PCP.  Secure communication sent to SNF social worker to make aware writer is following for [facility] needs and transition plans.  Morgan Rolling, MSN, RN,BSN [place] Acute Care Coordinator [phone] (Direct dial)

## 2021-11-28 DIAGNOSIS — L89102 Pressure ulcer of unspecified part of back, stage 2: Secondary | ICD-10-CM | POA: Diagnosis not present

## 2021-12-05 DIAGNOSIS — L89102 Pressure ulcer of unspecified part of back, stage 2: Secondary | ICD-10-CM | POA: Diagnosis not present

## 2021-12-09 DIAGNOSIS — K5900 Constipation, unspecified: Secondary | ICD-10-CM | POA: Diagnosis not present

## 2021-12-09 DIAGNOSIS — R410 Disorientation, unspecified: Secondary | ICD-10-CM | POA: Diagnosis not present

## 2021-12-10 ENCOUNTER — Other Ambulatory Visit: Payer: Self-pay | Admitting: *Deleted

## 2021-12-10 DIAGNOSIS — S22088A Other fracture of T11-T12 vertebra, initial encounter for closed fracture: Secondary | ICD-10-CM | POA: Diagnosis not present

## 2021-12-10 NOTE — Patient Outreach (Signed)
[place] Coordinator follow up. Mrs. Whiters resides in [facility]. Screening for care coordination services as benefit of insurance plan and PCP.   Update received from [facility]  SNF social worker. Mrs. Kracke continues to progress and work with therapy. She is from home with niece [name]. Anticipated transition plan is for LTC vs  home with niece.   PCP office [facility] has Upstream care management.   Will continue to follow.   Marthenia Rolling, MSN, RN,BSN [place] Acute Care Coordinator [phone] (Direct dial)

## 2021-12-17 ENCOUNTER — Other Ambulatory Visit: Payer: Self-pay | Admitting: *Deleted

## 2021-12-17 DIAGNOSIS — W19XXXA Unspecified fall, initial encounter: Secondary | ICD-10-CM | POA: Diagnosis not present

## 2021-12-17 DIAGNOSIS — N390 Urinary tract infection, site not specified: Secondary | ICD-10-CM | POA: Diagnosis not present

## 2021-12-17 DIAGNOSIS — S0003XA Contusion of scalp, initial encounter: Secondary | ICD-10-CM | POA: Diagnosis not present

## 2021-12-17 DIAGNOSIS — S22080A Wedge compression fracture of T11-T12 vertebra, initial encounter for closed fracture: Secondary | ICD-10-CM | POA: Diagnosis not present

## 2021-12-17 NOTE — Patient Outreach (Signed)
Per [facility] Mrs. Lopresti resides in [facility] SN[facility]. Screening for care coordination services as benefit of insurance plan and PCP.   Secure communication sent to SNF social worker to inquire about transition plans.   Will continue to follow.   PCP office [facility] has Upstream care management.    Marthenia Rolling, MSN, RN,BSN [place] Acute Care Coordinator [phone] (Direct dial)

## 2021-12-18 ENCOUNTER — Other Ambulatory Visit: Payer: Self-pay | Admitting: *Deleted

## 2021-12-18 NOTE — Patient Outreach (Signed)
[place] Coordinator follow up. Screening for care coordination services as benefit of insurance plan and PCP.   Update received from [place] [place] social worker. Mrs. Routt discharged home with her niece on yesterday, 12/17/21. Will have home health thru [facility].   PCP office [facility] has Upstream care management.   Will send secure notification to [facility] care management of SNF discharge.    Marthenia Rolling, MSN, RN,BSN [place] Acute Care Coordinator [phone] (Direct dial)

## 2021-12-21 DIAGNOSIS — B9620 Unspecified Escherichia coli [E. coli] as the cause of diseases classified elsewhere: Secondary | ICD-10-CM | POA: Diagnosis not present

## 2021-12-21 DIAGNOSIS — I2510 Atherosclerotic heart disease of native coronary artery without angina pectoris: Secondary | ICD-10-CM | POA: Diagnosis not present

## 2021-12-21 DIAGNOSIS — M4805 Spinal stenosis, thoracolumbar region: Secondary | ICD-10-CM | POA: Diagnosis not present

## 2021-12-21 DIAGNOSIS — E785 Hyperlipidemia, unspecified: Secondary | ICD-10-CM | POA: Diagnosis not present

## 2021-12-21 DIAGNOSIS — I130 Hypertensive heart and chronic kidney disease with heart failure and stage 1 through stage 4 chronic kidney disease, or unspecified chronic kidney disease: Secondary | ICD-10-CM | POA: Diagnosis not present

## 2021-12-21 DIAGNOSIS — K8689 Other specified diseases of pancreas: Secondary | ICD-10-CM | POA: Diagnosis not present

## 2021-12-21 DIAGNOSIS — M160 Bilateral primary osteoarthritis of hip: Secondary | ICD-10-CM | POA: Diagnosis not present

## 2021-12-21 DIAGNOSIS — E43 Unspecified severe protein-calorie malnutrition: Secondary | ICD-10-CM | POA: Diagnosis not present

## 2021-12-21 DIAGNOSIS — N1831 Chronic kidney disease, stage 3a: Secondary | ICD-10-CM | POA: Diagnosis not present

## 2021-12-21 DIAGNOSIS — K5790 Diverticulosis of intestine, part unspecified, without perforation or abscess without bleeding: Secondary | ICD-10-CM | POA: Diagnosis not present

## 2021-12-21 DIAGNOSIS — E876 Hypokalemia: Secondary | ICD-10-CM | POA: Diagnosis not present

## 2021-12-21 DIAGNOSIS — Z90710 Acquired absence of both cervix and uterus: Secondary | ICD-10-CM | POA: Diagnosis not present

## 2021-12-21 DIAGNOSIS — I5042 Chronic combined systolic (congestive) and diastolic (congestive) heart failure: Secondary | ICD-10-CM | POA: Diagnosis not present

## 2021-12-21 DIAGNOSIS — D696 Thrombocytopenia, unspecified: Secondary | ICD-10-CM | POA: Diagnosis not present

## 2021-12-21 DIAGNOSIS — M170 Bilateral primary osteoarthritis of knee: Secondary | ICD-10-CM | POA: Diagnosis not present

## 2021-12-21 DIAGNOSIS — Z9181 History of falling: Secondary | ICD-10-CM | POA: Diagnosis not present

## 2021-12-21 DIAGNOSIS — S22088D Other fracture of T11-T12 vertebra, subsequent encounter for fracture with routine healing: Secondary | ICD-10-CM | POA: Diagnosis not present

## 2021-12-21 DIAGNOSIS — M47815 Spondylosis without myelopathy or radiculopathy, thoracolumbar region: Secondary | ICD-10-CM | POA: Diagnosis not present

## 2021-12-21 DIAGNOSIS — I272 Pulmonary hypertension, unspecified: Secondary | ICD-10-CM | POA: Diagnosis not present

## 2021-12-21 DIAGNOSIS — Z8616 Personal history of COVID-19: Secondary | ICD-10-CM | POA: Diagnosis not present

## 2021-12-21 DIAGNOSIS — Z556 Problems related to health literacy: Secondary | ICD-10-CM | POA: Diagnosis not present

## 2021-12-21 DIAGNOSIS — B961 Klebsiella pneumoniae [K. pneumoniae] as the cause of diseases classified elsewhere: Secondary | ICD-10-CM | POA: Diagnosis not present

## 2021-12-21 DIAGNOSIS — N390 Urinary tract infection, site not specified: Secondary | ICD-10-CM | POA: Diagnosis not present

## 2021-12-21 DIAGNOSIS — M47812 Spondylosis without myelopathy or radiculopathy, cervical region: Secondary | ICD-10-CM | POA: Diagnosis not present

## 2021-12-24 DIAGNOSIS — S22088D Other fracture of T11-T12 vertebra, subsequent encounter for fracture with routine healing: Secondary | ICD-10-CM | POA: Diagnosis not present

## 2021-12-24 DIAGNOSIS — I272 Pulmonary hypertension, unspecified: Secondary | ICD-10-CM | POA: Diagnosis not present

## 2021-12-24 DIAGNOSIS — I130 Hypertensive heart and chronic kidney disease with heart failure and stage 1 through stage 4 chronic kidney disease, or unspecified chronic kidney disease: Secondary | ICD-10-CM | POA: Diagnosis not present

## 2021-12-24 DIAGNOSIS — I5042 Chronic combined systolic (congestive) and diastolic (congestive) heart failure: Secondary | ICD-10-CM | POA: Diagnosis not present

## 2021-12-24 DIAGNOSIS — N1831 Chronic kidney disease, stage 3a: Secondary | ICD-10-CM | POA: Diagnosis not present

## 2021-12-24 DIAGNOSIS — I2510 Atherosclerotic heart disease of native coronary artery without angina pectoris: Secondary | ICD-10-CM | POA: Diagnosis not present

## 2021-12-28 DIAGNOSIS — I5042 Chronic combined systolic (congestive) and diastolic (congestive) heart failure: Secondary | ICD-10-CM | POA: Diagnosis not present

## 2021-12-28 DIAGNOSIS — I2510 Atherosclerotic heart disease of native coronary artery without angina pectoris: Secondary | ICD-10-CM | POA: Diagnosis not present

## 2021-12-28 DIAGNOSIS — S22088D Other fracture of T11-T12 vertebra, subsequent encounter for fracture with routine healing: Secondary | ICD-10-CM | POA: Diagnosis not present

## 2021-12-28 DIAGNOSIS — I130 Hypertensive heart and chronic kidney disease with heart failure and stage 1 through stage 4 chronic kidney disease, or unspecified chronic kidney disease: Secondary | ICD-10-CM | POA: Diagnosis not present

## 2021-12-28 DIAGNOSIS — I272 Pulmonary hypertension, unspecified: Secondary | ICD-10-CM | POA: Diagnosis not present

## 2021-12-28 DIAGNOSIS — N1831 Chronic kidney disease, stage 3a: Secondary | ICD-10-CM | POA: Diagnosis not present

## 2022-01-01 DIAGNOSIS — F322 Major depressive disorder, single episode, severe without psychotic features: Secondary | ICD-10-CM | POA: Diagnosis not present

## 2022-01-01 DIAGNOSIS — K219 Gastro-esophageal reflux disease without esophagitis: Secondary | ICD-10-CM | POA: Diagnosis not present

## 2022-01-01 DIAGNOSIS — I25111 Atherosclerotic heart disease of native coronary artery with angina pectoris with documented spasm: Secondary | ICD-10-CM | POA: Diagnosis not present

## 2022-01-01 DIAGNOSIS — G44009 Cluster headache syndrome, unspecified, not intractable: Secondary | ICD-10-CM | POA: Diagnosis not present

## 2022-01-01 DIAGNOSIS — G4700 Insomnia, unspecified: Secondary | ICD-10-CM | POA: Diagnosis not present

## 2022-01-01 DIAGNOSIS — E7800 Pure hypercholesterolemia, unspecified: Secondary | ICD-10-CM | POA: Diagnosis not present

## 2022-01-01 DIAGNOSIS — R42 Dizziness and giddiness: Secondary | ICD-10-CM | POA: Diagnosis not present

## 2022-01-01 DIAGNOSIS — H04123 Dry eye syndrome of bilateral lacrimal glands: Secondary | ICD-10-CM | POA: Diagnosis not present

## 2022-01-01 DIAGNOSIS — I5032 Chronic diastolic (congestive) heart failure: Secondary | ICD-10-CM | POA: Diagnosis not present

## 2022-01-01 DIAGNOSIS — I10 Essential (primary) hypertension: Secondary | ICD-10-CM | POA: Diagnosis not present

## 2022-01-02 DIAGNOSIS — I130 Hypertensive heart and chronic kidney disease with heart failure and stage 1 through stage 4 chronic kidney disease, or unspecified chronic kidney disease: Secondary | ICD-10-CM | POA: Diagnosis not present

## 2022-01-02 DIAGNOSIS — I5042 Chronic combined systolic (congestive) and diastolic (congestive) heart failure: Secondary | ICD-10-CM | POA: Diagnosis not present

## 2022-01-02 DIAGNOSIS — S22088D Other fracture of T11-T12 vertebra, subsequent encounter for fracture with routine healing: Secondary | ICD-10-CM | POA: Diagnosis not present

## 2022-01-02 DIAGNOSIS — N1831 Chronic kidney disease, stage 3a: Secondary | ICD-10-CM | POA: Diagnosis not present

## 2022-01-02 DIAGNOSIS — I2510 Atherosclerotic heart disease of native coronary artery without angina pectoris: Secondary | ICD-10-CM | POA: Diagnosis not present

## 2022-01-02 DIAGNOSIS — I272 Pulmonary hypertension, unspecified: Secondary | ICD-10-CM | POA: Diagnosis not present

## 2022-01-04 DIAGNOSIS — I2510 Atherosclerotic heart disease of native coronary artery without angina pectoris: Secondary | ICD-10-CM | POA: Diagnosis not present

## 2022-01-04 DIAGNOSIS — I272 Pulmonary hypertension, unspecified: Secondary | ICD-10-CM | POA: Diagnosis not present

## 2022-01-04 DIAGNOSIS — I5042 Chronic combined systolic (congestive) and diastolic (congestive) heart failure: Secondary | ICD-10-CM | POA: Diagnosis not present

## 2022-01-04 DIAGNOSIS — S22088D Other fracture of T11-T12 vertebra, subsequent encounter for fracture with routine healing: Secondary | ICD-10-CM | POA: Diagnosis not present

## 2022-01-04 DIAGNOSIS — I130 Hypertensive heart and chronic kidney disease with heart failure and stage 1 through stage 4 chronic kidney disease, or unspecified chronic kidney disease: Secondary | ICD-10-CM | POA: Diagnosis not present

## 2022-01-04 DIAGNOSIS — N1831 Chronic kidney disease, stage 3a: Secondary | ICD-10-CM | POA: Diagnosis not present

## 2022-01-11 DIAGNOSIS — I272 Pulmonary hypertension, unspecified: Secondary | ICD-10-CM | POA: Diagnosis not present

## 2022-01-11 DIAGNOSIS — I5042 Chronic combined systolic (congestive) and diastolic (congestive) heart failure: Secondary | ICD-10-CM | POA: Diagnosis not present

## 2022-01-11 DIAGNOSIS — I130 Hypertensive heart and chronic kidney disease with heart failure and stage 1 through stage 4 chronic kidney disease, or unspecified chronic kidney disease: Secondary | ICD-10-CM | POA: Diagnosis not present

## 2022-01-11 DIAGNOSIS — S22088D Other fracture of T11-T12 vertebra, subsequent encounter for fracture with routine healing: Secondary | ICD-10-CM | POA: Diagnosis not present

## 2022-01-11 DIAGNOSIS — I2510 Atherosclerotic heart disease of native coronary artery without angina pectoris: Secondary | ICD-10-CM | POA: Diagnosis not present

## 2022-01-11 DIAGNOSIS — N1831 Chronic kidney disease, stage 3a: Secondary | ICD-10-CM | POA: Diagnosis not present

## 2022-01-14 DIAGNOSIS — S22088A Other fracture of T11-T12 vertebra, initial encounter for closed fracture: Secondary | ICD-10-CM | POA: Diagnosis not present

## 2022-01-16 DIAGNOSIS — I2510 Atherosclerotic heart disease of native coronary artery without angina pectoris: Secondary | ICD-10-CM | POA: Diagnosis not present

## 2022-01-16 DIAGNOSIS — N1831 Chronic kidney disease, stage 3a: Secondary | ICD-10-CM | POA: Diagnosis not present

## 2022-01-16 DIAGNOSIS — I272 Pulmonary hypertension, unspecified: Secondary | ICD-10-CM | POA: Diagnosis not present

## 2022-01-16 DIAGNOSIS — I130 Hypertensive heart and chronic kidney disease with heart failure and stage 1 through stage 4 chronic kidney disease, or unspecified chronic kidney disease: Secondary | ICD-10-CM | POA: Diagnosis not present

## 2022-01-16 DIAGNOSIS — S22088D Other fracture of T11-T12 vertebra, subsequent encounter for fracture with routine healing: Secondary | ICD-10-CM | POA: Diagnosis not present

## 2022-01-16 DIAGNOSIS — I5042 Chronic combined systolic (congestive) and diastolic (congestive) heart failure: Secondary | ICD-10-CM | POA: Diagnosis not present

## 2022-01-20 DIAGNOSIS — M170 Bilateral primary osteoarthritis of knee: Secondary | ICD-10-CM | POA: Diagnosis not present

## 2022-01-20 DIAGNOSIS — K5790 Diverticulosis of intestine, part unspecified, without perforation or abscess without bleeding: Secondary | ICD-10-CM | POA: Diagnosis not present

## 2022-01-20 DIAGNOSIS — Z90710 Acquired absence of both cervix and uterus: Secondary | ICD-10-CM | POA: Diagnosis not present

## 2022-01-20 DIAGNOSIS — E785 Hyperlipidemia, unspecified: Secondary | ICD-10-CM | POA: Diagnosis not present

## 2022-01-20 DIAGNOSIS — I272 Pulmonary hypertension, unspecified: Secondary | ICD-10-CM | POA: Diagnosis not present

## 2022-01-20 DIAGNOSIS — N1831 Chronic kidney disease, stage 3a: Secondary | ICD-10-CM | POA: Diagnosis not present

## 2022-01-20 DIAGNOSIS — I5042 Chronic combined systolic (congestive) and diastolic (congestive) heart failure: Secondary | ICD-10-CM | POA: Diagnosis not present

## 2022-01-20 DIAGNOSIS — E876 Hypokalemia: Secondary | ICD-10-CM | POA: Diagnosis not present

## 2022-01-20 DIAGNOSIS — I130 Hypertensive heart and chronic kidney disease with heart failure and stage 1 through stage 4 chronic kidney disease, or unspecified chronic kidney disease: Secondary | ICD-10-CM | POA: Diagnosis not present

## 2022-01-20 DIAGNOSIS — M47815 Spondylosis without myelopathy or radiculopathy, thoracolumbar region: Secondary | ICD-10-CM | POA: Diagnosis not present

## 2022-01-20 DIAGNOSIS — M4805 Spinal stenosis, thoracolumbar region: Secondary | ICD-10-CM | POA: Diagnosis not present

## 2022-01-20 DIAGNOSIS — N390 Urinary tract infection, site not specified: Secondary | ICD-10-CM | POA: Diagnosis not present

## 2022-01-20 DIAGNOSIS — I2510 Atherosclerotic heart disease of native coronary artery without angina pectoris: Secondary | ICD-10-CM | POA: Diagnosis not present

## 2022-01-20 DIAGNOSIS — Z8616 Personal history of COVID-19: Secondary | ICD-10-CM | POA: Diagnosis not present

## 2022-01-20 DIAGNOSIS — B961 Klebsiella pneumoniae [K. pneumoniae] as the cause of diseases classified elsewhere: Secondary | ICD-10-CM | POA: Diagnosis not present

## 2022-01-20 DIAGNOSIS — D696 Thrombocytopenia, unspecified: Secondary | ICD-10-CM | POA: Diagnosis not present

## 2022-01-20 DIAGNOSIS — K8689 Other specified diseases of pancreas: Secondary | ICD-10-CM | POA: Diagnosis not present

## 2022-01-20 DIAGNOSIS — Z556 Problems related to health literacy: Secondary | ICD-10-CM | POA: Diagnosis not present

## 2022-01-20 DIAGNOSIS — M47812 Spondylosis without myelopathy or radiculopathy, cervical region: Secondary | ICD-10-CM | POA: Diagnosis not present

## 2022-01-20 DIAGNOSIS — M160 Bilateral primary osteoarthritis of hip: Secondary | ICD-10-CM | POA: Diagnosis not present

## 2022-01-20 DIAGNOSIS — E43 Unspecified severe protein-calorie malnutrition: Secondary | ICD-10-CM | POA: Diagnosis not present

## 2022-01-20 DIAGNOSIS — S22088D Other fracture of T11-T12 vertebra, subsequent encounter for fracture with routine healing: Secondary | ICD-10-CM | POA: Diagnosis not present

## 2022-01-20 DIAGNOSIS — B9620 Unspecified Escherichia coli [E. coli] as the cause of diseases classified elsewhere: Secondary | ICD-10-CM | POA: Diagnosis not present

## 2022-01-20 DIAGNOSIS — Z9181 History of falling: Secondary | ICD-10-CM | POA: Diagnosis not present

## 2022-01-23 DIAGNOSIS — I5042 Chronic combined systolic (congestive) and diastolic (congestive) heart failure: Secondary | ICD-10-CM | POA: Diagnosis not present

## 2022-01-23 DIAGNOSIS — I2510 Atherosclerotic heart disease of native coronary artery without angina pectoris: Secondary | ICD-10-CM | POA: Diagnosis not present

## 2022-01-23 DIAGNOSIS — S22088D Other fracture of T11-T12 vertebra, subsequent encounter for fracture with routine healing: Secondary | ICD-10-CM | POA: Diagnosis not present

## 2022-01-23 DIAGNOSIS — I130 Hypertensive heart and chronic kidney disease with heart failure and stage 1 through stage 4 chronic kidney disease, or unspecified chronic kidney disease: Secondary | ICD-10-CM | POA: Diagnosis not present

## 2022-01-23 DIAGNOSIS — N1831 Chronic kidney disease, stage 3a: Secondary | ICD-10-CM | POA: Diagnosis not present

## 2022-01-23 DIAGNOSIS — I272 Pulmonary hypertension, unspecified: Secondary | ICD-10-CM | POA: Diagnosis not present

## 2022-01-30 DIAGNOSIS — S22088D Other fracture of T11-T12 vertebra, subsequent encounter for fracture with routine healing: Secondary | ICD-10-CM | POA: Diagnosis not present

## 2022-01-30 DIAGNOSIS — I2510 Atherosclerotic heart disease of native coronary artery without angina pectoris: Secondary | ICD-10-CM | POA: Diagnosis not present

## 2022-01-30 DIAGNOSIS — I272 Pulmonary hypertension, unspecified: Secondary | ICD-10-CM | POA: Diagnosis not present

## 2022-01-30 DIAGNOSIS — N1831 Chronic kidney disease, stage 3a: Secondary | ICD-10-CM | POA: Diagnosis not present

## 2022-01-30 DIAGNOSIS — I130 Hypertensive heart and chronic kidney disease with heart failure and stage 1 through stage 4 chronic kidney disease, or unspecified chronic kidney disease: Secondary | ICD-10-CM | POA: Diagnosis not present

## 2022-01-30 DIAGNOSIS — I5042 Chronic combined systolic (congestive) and diastolic (congestive) heart failure: Secondary | ICD-10-CM | POA: Diagnosis not present

## 2022-02-14 DIAGNOSIS — I5042 Chronic combined systolic (congestive) and diastolic (congestive) heart failure: Secondary | ICD-10-CM | POA: Diagnosis not present

## 2022-02-14 DIAGNOSIS — I272 Pulmonary hypertension, unspecified: Secondary | ICD-10-CM | POA: Diagnosis not present

## 2022-02-14 DIAGNOSIS — I2510 Atherosclerotic heart disease of native coronary artery without angina pectoris: Secondary | ICD-10-CM | POA: Diagnosis not present

## 2022-02-14 DIAGNOSIS — N1831 Chronic kidney disease, stage 3a: Secondary | ICD-10-CM | POA: Diagnosis not present

## 2022-02-14 DIAGNOSIS — S22088D Other fracture of T11-T12 vertebra, subsequent encounter for fracture with routine healing: Secondary | ICD-10-CM | POA: Diagnosis not present

## 2022-02-14 DIAGNOSIS — I130 Hypertensive heart and chronic kidney disease with heart failure and stage 1 through stage 4 chronic kidney disease, or unspecified chronic kidney disease: Secondary | ICD-10-CM | POA: Diagnosis not present

## 2022-02-18 DIAGNOSIS — M48062 Spinal stenosis, lumbar region with neurogenic claudication: Secondary | ICD-10-CM | POA: Diagnosis not present

## 2022-02-18 DIAGNOSIS — S22088A Other fracture of T11-T12 vertebra, initial encounter for closed fracture: Secondary | ICD-10-CM | POA: Diagnosis not present

## 2022-02-27 DIAGNOSIS — H10413 Chronic giant papillary conjunctivitis, bilateral: Secondary | ICD-10-CM | POA: Diagnosis not present

## 2022-02-27 DIAGNOSIS — H5202 Hypermetropia, left eye: Secondary | ICD-10-CM | POA: Diagnosis not present

## 2022-02-27 DIAGNOSIS — Z961 Presence of intraocular lens: Secondary | ICD-10-CM | POA: Diagnosis not present

## 2022-02-28 DIAGNOSIS — H903 Sensorineural hearing loss, bilateral: Secondary | ICD-10-CM | POA: Diagnosis not present

## 2022-03-04 DIAGNOSIS — F322 Major depressive disorder, single episode, severe without psychotic features: Secondary | ICD-10-CM | POA: Diagnosis not present

## 2022-03-04 DIAGNOSIS — R350 Frequency of micturition: Secondary | ICD-10-CM | POA: Diagnosis not present

## 2022-03-04 DIAGNOSIS — Z8781 Personal history of (healed) traumatic fracture: Secondary | ICD-10-CM | POA: Diagnosis not present

## 2022-03-04 DIAGNOSIS — G4700 Insomnia, unspecified: Secondary | ICD-10-CM | POA: Diagnosis not present

## 2022-03-04 DIAGNOSIS — I10 Essential (primary) hypertension: Secondary | ICD-10-CM | POA: Diagnosis not present

## 2022-03-04 DIAGNOSIS — J310 Chronic rhinitis: Secondary | ICD-10-CM | POA: Diagnosis not present

## 2022-03-04 DIAGNOSIS — I2510 Atherosclerotic heart disease of native coronary artery without angina pectoris: Secondary | ICD-10-CM | POA: Diagnosis not present

## 2022-03-04 DIAGNOSIS — Z5181 Encounter for therapeutic drug level monitoring: Secondary | ICD-10-CM | POA: Diagnosis not present

## 2022-03-07 DIAGNOSIS — M47816 Spondylosis without myelopathy or radiculopathy, lumbar region: Secondary | ICD-10-CM | POA: Diagnosis not present

## 2022-03-25 DIAGNOSIS — M47816 Spondylosis without myelopathy or radiculopathy, lumbar region: Secondary | ICD-10-CM | POA: Diagnosis not present

## 2022-03-25 DIAGNOSIS — M25511 Pain in right shoulder: Secondary | ICD-10-CM | POA: Diagnosis not present

## 2022-04-30 DIAGNOSIS — M47816 Spondylosis without myelopathy or radiculopathy, lumbar region: Secondary | ICD-10-CM | POA: Diagnosis not present

## 2022-06-03 DIAGNOSIS — R21 Rash and other nonspecific skin eruption: Secondary | ICD-10-CM | POA: Diagnosis not present

## 2022-06-03 DIAGNOSIS — B372 Candidiasis of skin and nail: Secondary | ICD-10-CM | POA: Diagnosis not present

## 2022-08-07 DIAGNOSIS — R350 Frequency of micturition: Secondary | ICD-10-CM | POA: Diagnosis not present

## 2022-08-07 DIAGNOSIS — K625 Hemorrhage of anus and rectum: Secondary | ICD-10-CM | POA: Diagnosis not present

## 2022-08-07 DIAGNOSIS — I10 Essential (primary) hypertension: Secondary | ICD-10-CM | POA: Diagnosis not present

## 2022-08-26 DIAGNOSIS — M6281 Muscle weakness (generalized): Secondary | ICD-10-CM | POA: Diagnosis not present

## 2022-08-26 DIAGNOSIS — I5042 Chronic combined systolic (congestive) and diastolic (congestive) heart failure: Secondary | ICD-10-CM | POA: Diagnosis not present

## 2022-08-26 DIAGNOSIS — I2510 Atherosclerotic heart disease of native coronary artery without angina pectoris: Secondary | ICD-10-CM | POA: Diagnosis not present

## 2022-08-26 DIAGNOSIS — E876 Hypokalemia: Secondary | ICD-10-CM | POA: Diagnosis not present

## 2022-08-26 DIAGNOSIS — I110 Hypertensive heart disease with heart failure: Secondary | ICD-10-CM | POA: Diagnosis not present

## 2022-08-26 DIAGNOSIS — F3341 Major depressive disorder, recurrent, in partial remission: Secondary | ICD-10-CM | POA: Diagnosis not present

## 2022-09-11 DIAGNOSIS — L84 Corns and callosities: Secondary | ICD-10-CM | POA: Diagnosis not present

## 2022-09-11 DIAGNOSIS — K5900 Constipation, unspecified: Secondary | ICD-10-CM | POA: Diagnosis not present

## 2022-09-11 DIAGNOSIS — I2510 Atherosclerotic heart disease of native coronary artery without angina pectoris: Secondary | ICD-10-CM | POA: Diagnosis not present

## 2022-09-11 DIAGNOSIS — F322 Major depressive disorder, single episode, severe without psychotic features: Secondary | ICD-10-CM | POA: Diagnosis not present

## 2022-09-11 DIAGNOSIS — E7800 Pure hypercholesterolemia, unspecified: Secondary | ICD-10-CM | POA: Diagnosis not present

## 2022-09-11 DIAGNOSIS — I10 Essential (primary) hypertension: Secondary | ICD-10-CM | POA: Diagnosis not present

## 2022-09-23 ENCOUNTER — Encounter: Payer: Self-pay | Admitting: Podiatry

## 2022-09-23 ENCOUNTER — Ambulatory Visit (INDEPENDENT_AMBULATORY_CARE_PROVIDER_SITE_OTHER): Payer: Medicare Other | Admitting: Podiatry

## 2022-09-23 DIAGNOSIS — M79609 Pain in unspecified limb: Secondary | ICD-10-CM

## 2022-09-23 DIAGNOSIS — B351 Tinea unguium: Secondary | ICD-10-CM | POA: Diagnosis not present

## 2022-09-23 DIAGNOSIS — L84 Corns and callosities: Secondary | ICD-10-CM

## 2022-09-24 NOTE — Progress Notes (Signed)
Subjective:   Patient ID: [name], female   DOB: 89 y.o.   MRN: [id number]   HPI Patient presents very painful lesion plantar aspect right foot and elongated nails that she cannot take care of and is with caregiver.  She is 89 years old still has reasonable mental capabilities and does use a wheelchair is not active does not smoke   Review of Systems  All other systems reviewed and are negative.       Objective:  Physical Exam Vitals and nursing note reviewed.  Constitutional:      Appearance: She is well-developed.  Pulmonary:     Effort: Pulmonary effort is normal.  Musculoskeletal:        General: Normal range of motion.  Skin:    General: Skin is warm.  Neurological:     Mental Status: She is alert.   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

## 2022-10-07 DIAGNOSIS — R350 Frequency of micturition: Secondary | ICD-10-CM | POA: Diagnosis not present

## 2022-11-07 DIAGNOSIS — Z23 Encounter for immunization: Secondary | ICD-10-CM | POA: Diagnosis not present

## 2022-11-27 DIAGNOSIS — G479 Sleep disorder, unspecified: Secondary | ICD-10-CM | POA: Diagnosis not present

## 2022-11-27 DIAGNOSIS — R5381 Other malaise: Secondary | ICD-10-CM | POA: Diagnosis not present

## 2023-02-21 ENCOUNTER — Encounter: Payer: Self-pay | Admitting: Podiatry

## 2023-02-21 ENCOUNTER — Ambulatory Visit (INDEPENDENT_AMBULATORY_CARE_PROVIDER_SITE_OTHER): Payer: Medicare Other | Admitting: Podiatry

## 2023-02-21 DIAGNOSIS — M79609 Pain in unspecified limb: Secondary | ICD-10-CM | POA: Diagnosis not present

## 2023-02-21 DIAGNOSIS — B351 Tinea unguium: Secondary | ICD-10-CM | POA: Diagnosis not present

## 2023-02-24 NOTE — Progress Notes (Signed)
Subjective:   Patient ID: [name], female   DOB: 89 y.o.   MRN: [id number]   HPI Patient presents with caregiver with elongated nailbeds 1-5 both feet thick yellow and they are painful for her   ROS      Objective:  Physical Exam  Neurovascular status intact thick yellow brittle nailbeds 1-5 both feet     Assessment:  Chronic mycotic nail infection 1-5 both feet that they cannot take care of with pain     Plan:  Debride nailbeds 1-5 courtesy debridement of several lesions no intra genic bleeding reappoint routine care

## 2023-03-25 DIAGNOSIS — R350 Frequency of micturition: Secondary | ICD-10-CM | POA: Diagnosis not present

## 2023-05-12 DIAGNOSIS — W19XXXA Unspecified fall, initial encounter: Secondary | ICD-10-CM | POA: Diagnosis not present

## 2023-05-12 DIAGNOSIS — K625 Hemorrhage of anus and rectum: Secondary | ICD-10-CM | POA: Diagnosis not present

## 2023-05-12 DIAGNOSIS — R079 Chest pain, unspecified: Secondary | ICD-10-CM | POA: Diagnosis not present

## 2023-05-12 DIAGNOSIS — M25519 Pain in unspecified shoulder: Secondary | ICD-10-CM | POA: Diagnosis not present

## 2023-05-12 DIAGNOSIS — R410 Disorientation, unspecified: Secondary | ICD-10-CM | POA: Diagnosis not present

## 2023-05-14 DIAGNOSIS — R410 Disorientation, unspecified: Secondary | ICD-10-CM | POA: Diagnosis not present

## 2023-05-21 ENCOUNTER — Ambulatory Visit (INDEPENDENT_AMBULATORY_CARE_PROVIDER_SITE_OTHER): Admitting: Podiatry

## 2023-05-21 DIAGNOSIS — M79609 Pain in unspecified limb: Secondary | ICD-10-CM | POA: Diagnosis not present

## 2023-05-21 DIAGNOSIS — B351 Tinea unguium: Secondary | ICD-10-CM | POA: Diagnosis not present

## 2023-05-21 NOTE — Progress Notes (Signed)
Subjective:   Patient ID: [name], female   DOB: 89 y.o.   MRN: [id number]   HPI Patient presents very painful lesion plantar aspect right foot and elongated nails that she cannot take care of and is with caregiver.  She is 89 years old still has reasonable mental capabilities and does use a wheelchair is not active does not smoke   Review of Systems  All other systems reviewed and are negative.       Objective:  Physical Exam Vitals and nursing note reviewed.  Constitutional:      Appearance: She is well-developed.  Pulmonary:     Effort: Pulmonary effort is normal.  Musculoskeletal:        General: Normal range of motion.  Skin:    General: Skin is warm.  Neurological:     Mental Status: She is alert.   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

## 2023-06-30 DIAGNOSIS — Z681 Body mass index (BMI) 19 or less, adult: Secondary | ICD-10-CM | POA: Diagnosis not present

## 2023-06-30 DIAGNOSIS — I110 Hypertensive heart disease with heart failure: Secondary | ICD-10-CM | POA: Diagnosis not present

## 2023-06-30 DIAGNOSIS — I5042 Chronic combined systolic (congestive) and diastolic (congestive) heart failure: Secondary | ICD-10-CM | POA: Diagnosis not present

## 2023-06-30 DIAGNOSIS — I2510 Atherosclerotic heart disease of native coronary artery without angina pectoris: Secondary | ICD-10-CM | POA: Diagnosis not present

## 2023-06-30 DIAGNOSIS — D649 Anemia, unspecified: Secondary | ICD-10-CM | POA: Diagnosis not present

## 2023-06-30 DIAGNOSIS — H8190 Unspecified disorder of vestibular function, unspecified ear: Secondary | ICD-10-CM | POA: Diagnosis not present

## 2023-06-30 DIAGNOSIS — E876 Hypokalemia: Secondary | ICD-10-CM | POA: Diagnosis not present

## 2023-06-30 DIAGNOSIS — N3090 Cystitis, unspecified without hematuria: Secondary | ICD-10-CM | POA: Diagnosis not present

## 2023-06-30 DIAGNOSIS — M6281 Muscle weakness (generalized): Secondary | ICD-10-CM | POA: Diagnosis not present

## 2023-06-30 DIAGNOSIS — F3341 Major depressive disorder, recurrent, in partial remission: Secondary | ICD-10-CM | POA: Diagnosis not present

## 2023-06-30 DIAGNOSIS — E46 Unspecified protein-calorie malnutrition: Secondary | ICD-10-CM | POA: Diagnosis not present

## 2023-07-07 DIAGNOSIS — M25511 Pain in right shoulder: Secondary | ICD-10-CM | POA: Diagnosis not present

## 2023-07-08 DIAGNOSIS — N390 Urinary tract infection, site not specified: Secondary | ICD-10-CM | POA: Diagnosis not present

## 2023-07-08 DIAGNOSIS — I2510 Atherosclerotic heart disease of native coronary artery without angina pectoris: Secondary | ICD-10-CM | POA: Diagnosis not present

## 2023-07-08 DIAGNOSIS — G4700 Insomnia, unspecified: Secondary | ICD-10-CM | POA: Diagnosis not present

## 2023-07-08 DIAGNOSIS — M25519 Pain in unspecified shoulder: Secondary | ICD-10-CM | POA: Diagnosis not present

## 2023-07-08 DIAGNOSIS — L89151 Pressure ulcer of sacral region, stage 1: Secondary | ICD-10-CM | POA: Diagnosis not present

## 2023-08-14 DIAGNOSIS — M25511 Pain in right shoulder: Secondary | ICD-10-CM | POA: Diagnosis not present

## 2023-08-21 DIAGNOSIS — I110 Hypertensive heart disease with heart failure: Secondary | ICD-10-CM | POA: Diagnosis not present

## 2023-08-21 DIAGNOSIS — I2510 Atherosclerotic heart disease of native coronary artery without angina pectoris: Secondary | ICD-10-CM | POA: Diagnosis not present

## 2023-08-21 DIAGNOSIS — F3341 Major depressive disorder, recurrent, in partial remission: Secondary | ICD-10-CM | POA: Diagnosis not present

## 2023-08-21 DIAGNOSIS — I5042 Chronic combined systolic (congestive) and diastolic (congestive) heart failure: Secondary | ICD-10-CM | POA: Diagnosis not present

## 2023-08-30 ENCOUNTER — Emergency Department (HOSPITAL_COMMUNITY)

## 2023-08-30 ENCOUNTER — Emergency Department (HOSPITAL_COMMUNITY)
Admission: EM | Admit: 2023-08-30 | Discharge: 2023-08-30 | Disposition: A | Discharge status: Home / Self Care | Attending: Emergency Medicine | Admitting: Emergency Medicine

## 2023-08-30 ENCOUNTER — Other Ambulatory Visit: Payer: Self-pay

## 2023-08-30 ENCOUNTER — Encounter (HOSPITAL_COMMUNITY): Payer: Self-pay | Admitting: Emergency Medicine

## 2023-08-30 DIAGNOSIS — E785 Hyperlipidemia, unspecified: Secondary | ICD-10-CM | POA: Insufficient documentation

## 2023-08-30 DIAGNOSIS — M160 Bilateral primary osteoarthritis of hip: Secondary | ICD-10-CM | POA: Diagnosis not present

## 2023-08-30 DIAGNOSIS — M25561 Pain in right knee: Secondary | ICD-10-CM | POA: Diagnosis not present

## 2023-08-30 DIAGNOSIS — R519 Headache, unspecified: Secondary | ICD-10-CM | POA: Diagnosis not present

## 2023-08-30 DIAGNOSIS — S80919A Unspecified superficial injury of unspecified knee, initial encounter: Secondary | ICD-10-CM | POA: Diagnosis not present

## 2023-08-30 DIAGNOSIS — M25562 Pain in left knee: Secondary | ICD-10-CM | POA: Diagnosis not present

## 2023-08-30 DIAGNOSIS — M25511 Pain in right shoulder: Secondary | ICD-10-CM | POA: Insufficient documentation

## 2023-08-30 DIAGNOSIS — I700 Atherosclerosis of aorta: Secondary | ICD-10-CM | POA: Diagnosis not present

## 2023-08-30 DIAGNOSIS — S0990XA Unspecified injury of head, initial encounter: Secondary | ICD-10-CM | POA: Diagnosis present

## 2023-08-30 DIAGNOSIS — D72829 Elevated white blood cell count, unspecified: Secondary | ICD-10-CM | POA: Diagnosis not present

## 2023-08-30 DIAGNOSIS — M4802 Spinal stenosis, cervical region: Secondary | ICD-10-CM | POA: Insufficient documentation

## 2023-08-30 DIAGNOSIS — K5730 Diverticulosis of large intestine without perforation or abscess without bleeding: Secondary | ICD-10-CM | POA: Diagnosis not present

## 2023-08-30 DIAGNOSIS — R109 Unspecified abdominal pain: Secondary | ICD-10-CM | POA: Diagnosis not present

## 2023-08-30 DIAGNOSIS — W19XXXA Unspecified fall, initial encounter: Secondary | ICD-10-CM

## 2023-08-30 DIAGNOSIS — R1084 Generalized abdominal pain: Secondary | ICD-10-CM | POA: Insufficient documentation

## 2023-08-30 DIAGNOSIS — I2510 Atherosclerotic heart disease of native coronary artery without angina pectoris: Secondary | ICD-10-CM | POA: Diagnosis not present

## 2023-08-30 DIAGNOSIS — M50322 Other cervical disc degeneration at C5-C6 level: Secondary | ICD-10-CM | POA: Diagnosis not present

## 2023-08-30 DIAGNOSIS — Z043 Encounter for examination and observation following other accident: Secondary | ICD-10-CM | POA: Diagnosis not present

## 2023-08-30 DIAGNOSIS — K449 Diaphragmatic hernia without obstruction or gangrene: Secondary | ICD-10-CM | POA: Insufficient documentation

## 2023-08-30 DIAGNOSIS — M47812 Spondylosis without myelopathy or radiculopathy, cervical region: Secondary | ICD-10-CM | POA: Diagnosis not present

## 2023-08-30 DIAGNOSIS — W1830XA Fall on same level, unspecified, initial encounter: Secondary | ICD-10-CM | POA: Diagnosis not present

## 2023-08-30 DIAGNOSIS — M47817 Spondylosis without myelopathy or radiculopathy, lumbosacral region: Secondary | ICD-10-CM | POA: Diagnosis not present

## 2023-08-30 DIAGNOSIS — Z90710 Acquired absence of both cervix and uterus: Secondary | ICD-10-CM | POA: Diagnosis not present

## 2023-08-30 DIAGNOSIS — M25519 Pain in unspecified shoulder: Secondary | ICD-10-CM | POA: Diagnosis not present

## 2023-08-30 DIAGNOSIS — M549 Dorsalgia, unspecified: Secondary | ICD-10-CM | POA: Diagnosis not present

## 2023-08-30 DIAGNOSIS — R9082 White matter disease, unspecified: Secondary | ICD-10-CM | POA: Diagnosis not present

## 2023-08-30 DIAGNOSIS — R197 Diarrhea, unspecified: Secondary | ICD-10-CM | POA: Diagnosis present

## 2023-08-30 DIAGNOSIS — M19011 Primary osteoarthritis, right shoulder: Secondary | ICD-10-CM | POA: Diagnosis not present

## 2023-08-30 LAB — URINALYSIS, ROUTINE W REFLEX MICROSCOPIC
Bilirubin Urine: NEGATIVE
Glucose, UA: NEGATIVE mg/dL
Ketones, ur: NEGATIVE mg/dL
Nitrite: NEGATIVE
Protein, ur: 100 mg/dL — AB
Specific Gravity, Urine: 1.017 (ref 1.005–1.030)
pH: 5 (ref 5.0–8.0)

## 2023-08-30 LAB — CBC WITH DIFFERENTIAL/PLATELET
Abs Immature Granulocytes: 0.12 K/uL — ABNORMAL HIGH (ref 0.00–0.07)
Basophils Absolute: 0 K/uL (ref 0.0–0.1)
Basophils Relative: 0 %
Eosinophils Absolute: 0 K/uL (ref 0.0–0.5)
Eosinophils Relative: 0 %
HCT: 36.2 % (ref 36.0–46.0)
Hemoglobin: 11.4 g/dL — ABNORMAL LOW (ref 12.0–15.0)
Immature Granulocytes: 1 %
Lymphocytes Relative: 13 %
Lymphs Abs: 1.3 K/uL (ref 0.7–4.0)
MCH: 28.9 pg (ref 26.0–34.0)
MCHC: 31.5 g/dL (ref 30.0–36.0)
MCV: 91.6 fL (ref 80.0–100.0)
Monocytes Absolute: 1.2 K/uL — ABNORMAL HIGH (ref 0.1–1.0)
Monocytes Relative: 12 %
Neutro Abs: 7.7 K/uL (ref 1.7–7.7)
Neutrophils Relative %: 74 %
Platelets: 158 K/uL (ref 150–400)
RBC: 3.95 MIL/uL (ref 3.87–5.11)
RDW: 15.7 % — ABNORMAL HIGH (ref 11.5–15.5)
WBC: 10.3 K/uL (ref 4.0–10.5)
nRBC: 0 % (ref 0.0–0.2)

## 2023-08-30 LAB — COMPREHENSIVE METABOLIC PANEL WITH GFR
ALT: 8 U/L (ref 0–44)
AST: 14 U/L — ABNORMAL LOW (ref 15–41)
Albumin: 2.9 g/dL — ABNORMAL LOW (ref 3.5–5.0)
Alkaline Phosphatase: 70 U/L (ref 38–126)
Anion gap: 9 (ref 5–15)
BUN: 12 mg/dL (ref 8–23)
CO2: 25 mmol/L (ref 22–32)
Calcium: 8.5 mg/dL — ABNORMAL LOW (ref 8.9–10.3)
Chloride: 107 mmol/L (ref 98–111)
Creatinine, Ser: 0.62 mg/dL (ref 0.44–1.00)
GFR, Estimated: >60 mL/min (ref >60)
Glucose, Bld: 98 mg/dL (ref 70–99)
Potassium: 2.8 mmol/L — ABNORMAL LOW (ref 3.5–5.1)
Sodium: 141 mmol/L (ref 135–145)
Total Bilirubin: 0.9 mg/dL (ref 0.0–1.2)
Total Protein: 7.6 g/dL (ref 6.5–8.1)

## 2023-08-30 MED ORDER — IOHEXOL 300 MG/ML  SOLN: 100.0000 mL | Freq: Once | INTRAMUSCULAR | Status: DC | PRN

## 2023-08-30 NOTE — Discharge Instructions (Signed)
 Your laboratory results are within normal limits today, your urinalysis had some signs of infection, this was sent for culture.  CT of your head, cervical spine were negative.  X-rays of your chest along with pelvis were within normal limits.

## 2023-08-30 NOTE — ED Triage Notes (Signed)
 Pt bib EMS from home. Pt reports falling 5 days ago but did not come to be seen after fall. Pt reports hitting her head when she fell. Pt stating having pain on back of head, right shoulder and both knees all new from the fall 5 days ago. Pt also complains of diarrhea that started 2-3 days ago. Denies N/V. BP 142/70 CBG 104

## 2023-08-30 NOTE — ED Provider Notes (Signed)
 [name] Provider Note   CSN: [id number] Arrival date & time: 08/30/23  9068     Patient presents with: [name] is a 89 y.o. female.   89 year old female with a past medical history of CAD, hyperlipidemia, GI bleed presents to the ED status post mechanical fall.  Patient tells me she was in her bedroom this morning, tried to ambulate from the bed onto the chair when she suddenly fell.  She reports striking the back of her head, does have a large goose egg to the posterior aspect.  She is endorsing pain along the right shoulder, abdomen, hips.  I do not think she has ambulated since the incident, she is very hard of hearing and very hard to obtain history from. Level 5 caveat due to mental state.   The history is provided by the patient.  Fall Associated symptoms include abdominal pain. Pertinent negatives include no shortness of breath.       Prior to Admission medications   Medication Sig Start Date End Date Taking? Authorizing Provider  acetaminophen  (TYLENOL ) 325 MG tablet Take 650 mg by mouth daily as needed for mild pain (pain score 1-3) or moderate pain (pain score 4-6).    [provider]  amLODipine  (NORVASC ) 2.5 MG tablet Take 2.5 mg by mouth daily. 10/11/21   [provider]  furosemide  (LASIX ) 40 MG tablet Take 20 mg by mouth See admin instructions. MON WED FRI SAT 10/31/21   [provider]  isosorbide  dinitrate (ISORDIL ) 30 MG tablet Take by mouth. 08/19/23   [provider]  isosorbide  mononitrate (IMDUR ) 30 MG 24 hr tablet Take 0.5 tablets (15 mg total) by mouth daily. 11/26/21   Jerri Keys, MD  lidocaine  (LIDODERM ) 5 % Place 1 patch onto the skin daily. Remove & Discard patch within 12 hours or as directed by MD 11/25/21   Jerri Keys, MD  loperamide  (IMODIUM ) 2 MG capsule Take 2 mg by mouth every Monday, Wednesday, and Friday.    [provider]  magnesium  oxide  (MAG-OX) 400 (240 Mg) MG tablet Take 1 tablet (400 mg total) by mouth daily. 11/24/21   Jerri Keys, MD  meclizine  (ANTIVERT ) 25 MG tablet Take 25 mg by mouth 2 (two) times daily as needed for dizziness. 09/28/21   [provider]  metoprolol  succinate (TOPROL -XL) 25 MG 24 hr tablet Take 0.5 tablets (12.5 mg total) by mouth daily. 11/26/21   Jerri Keys, MD  pantoprazole  (PROTONIX ) 40 MG tablet Take 40 mg by mouth every Monday, Wednesday, and Friday.    [provider]  PARoxetine  (PAXIL ) 10 MG tablet Take 10 mg by mouth daily. 03/10/20   [provider]  polyethylene glycol (MIRALAX ) packet Take 17 g by mouth daily as needed for mild constipation. 08/21/17   Luke Agent, MD  potassium chloride  (KLOR-CON ) 10 MEQ tablet Take 10 mEq by mouth 2 (two) times daily.    [provider]  potassium chloride  SA (KLOR-CON  M) 20 MEQ tablet Take 20 mEq by mouth daily.    [provider]  PREMARIN  0.3 MG tablet Take 0.15 mg by mouth daily. 10/31/21   [provider]  Prenatal Vit-Fe Fumarate-FA (PRENATAL MULTIVITAMIN) TABS tablet Take 1 tablet by mouth daily at 12 noon.    [provider]  Propylene Glycol (SYSTANE BALANCE) 0.6 % SOLN Place 1 drop into both eyes daily as needed (dry eyes).    [provider]  traZODone  (DESYREL ) 50 MG tablet Take 25-50 mg by mouth at bedtime as needed for sleep.    [provider]    Allergies: Ace inhibitors, Streptomycin, Tramadol, Vesicare [solifenacin succinate], and Penicillins    Review of Systems  Constitutional:  Negative for fever.  Respiratory:  Negative for shortness of breath.   Gastrointestinal:  Positive for abdominal pain. Negative for nausea and vomiting.  Musculoskeletal:  Positive for arthralgias and myalgias.  All other systems reviewed and are negative.   Updated Vital Signs BP (!) 136/116 (BP Location: Left Arm)   Pulse 75   Temp 98.1 F (36.7 C) (Oral)   Resp 17   Ht 5' 6  (1.676 m)   Wt 48.5 kg   SpO2 96%   BMI 17.27 kg/m   Physical Exam Vitals and nursing note reviewed.  Constitutional:      Appearance: Normal appearance.     Comments: Frail.   HENT:     Head: Normocephalic.     Comments: Large goose egg to the posterior aspect.     Mouth/Throat:     Mouth: Mucous membranes are dry.  Cardiovascular:     Rate and Rhythm: Normal rate.  Abdominal:     General: Abdomen is flat.     Palpations: Abdomen is soft.     Tenderness: There is abdominal tenderness.     Comments: Has some tenderness, generalized.   Skin:    General: Skin is warm and dry.  Neurological:     Mental Status: She is alert. Mental status is at baseline.     Comments: Moves upper and lower extremities slowly.      (all labs ordered are listed, but only abnormal results are displayed) Labs Reviewed  CBC WITH DIFFERENTIAL/PLATELET - Abnormal; Notable for the following components:      Result Value   Hemoglobin 11.4 (*)    RDW 15.7 (*)    Monocytes Absolute 1.2 (*)    Abs Immature Granulocytes 0.12 (*)    All other components within normal limits  COMPREHENSIVE METABOLIC PANEL WITH GFR - Abnormal; Notable for the following components:   Potassium 2.8 (*)    Calcium 8.5 (*)    Albumin 2.9 (*)    AST 14 (*)    All other components within normal limits  URINALYSIS, ROUTINE W REFLEX MICROSCOPIC - Abnormal; Notable for the following components:   APPearance HAZY (*)    Hgb urine dipstick MODERATE (*)    Protein, ur 100 (*)    Leukocytes,Ua SMALL (*)    Bacteria, UA MANY (*)    All other components within normal limits    EKG: None  Radiology: CT ABDOMEN PELVIS WO CONTRAST Result Date: 08/30/2023 CLINICAL DATA:  Abdominal pain, acute, nonlocalized. EXAM: CT ABDOMEN AND PELVIS WITHOUT CONTRAST TECHNIQUE: Multidetector CT imaging of the abdomen and pelvis was performed following the standard protocol without IV contrast. RADIATION DOSE REDUCTION: This exam was performed  according to the departmental dose-optimization program which includes automated exposure control, adjustment of the mA and/or kV according to patient size and/or use of iterative reconstruction technique. COMPARISON:  CT scan abdomen and pelvis from 11/21/2021. FINDINGS: Lower chest: There are bilateral trace pleural effusions with associated compressive atelectatic changes in the bilateral lower lobes. Top-normal heart size. No pericardial effusion. Hepatobiliary: The liver is normal in size. Non-cirrhotic configuration. No suspicious mass. No intrahepatic or extrahepatic bile duct dilation. No calcified gallstones. Normal gallbladder wall thickness. No pericholecystic inflammatory changes. Pancreas: Unremarkable. No  pancreatic ductal dilatation or surrounding inflammatory changes. Spleen: Within normal limits. No focal lesion. Adrenals/Urinary Tract: Adrenal glands are unremarkable. No suspicious renal mass within the limitations of this unenhanced exam. No hydronephrosis. No renal or ureteric calculi. Unremarkable urinary bladder. Stomach/Bowel: No disproportionate dilation of the small or large bowel loops. No evidence of abnormal bowel wall thickening or inflammatory changes. A dilated short appendix is again seen without surrounding inflammatory changes. There are multiple diverticula throughout the colon, without imaging signs of diverticulitis. Vascular/Lymphatic: No ascites or pneumoperitoneum. No abdominal or pelvic lymphadenopathy, by size criteria. No aneurysmal dilation of the major abdominal arteries. There are mild peripheral atherosclerotic vascular calcifications of the aorta and its major branches. Reproductive: The uterus is surgically absent. No large adnexal mass. Other: Redemonstration of left periumbilical hernia containing ascitic fluid. The soft tissues and abdominal wall are otherwise unremarkable. Musculoskeletal: No suspicious osseous lesions. There are moderate multilevel degenerative  changes in the visualized spine. IMPRESSION: 1. No acute inflammatory process identified within the abdomen or pelvis. 2. There are bilateral trace pleural effusions with associated compressive atelectatic changes in the bilateral lower lobes. 3. Multiple other nonacute observations, as described above. Aortic Atherosclerosis (ICD10-I70.0). Electronically Signed   By: Ree Molt M.D.   On: 08/30/2023 13:57   DG Pelvis 1-2 Views Result Date: 08/30/2023 CLINICAL DATA:  Fall 5 days ago. 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 No acute fracture or dislocation. Degenerative change of the spine. Remainder of the exam is unchanged. IMPRESSION: 1. No acute findings. 2. 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 Electronically Signed   By: Toribio Agreste M.D.   On: 08/30/2023 11:08   DG Shoulder Right Result Date: 08/30/2023 CLINICAL DATA:  Fall 5 days ago with right shoulder pain. EXAM: RIGHT SHOULDER - 2+ VIEW COMPARISON:  07/07/2023 FINDINGS: Mild degenerative change of the [facility] joint and glenohumeral joints. No acute fracture or dislocation. Decreased acromial humeral joint space which may be due to chronic rotator cuff disease. IMPRESSION: 1. No acute findings. 2. Mild degenerative changes. Electronically Signed   By: Toribio Agreste M.D.   On: 08/30/2023 11:07   DG Chest 2 View Result Date: 08/30/2023 CLINICAL DATA:  Fall 5 days ago with right shoulder and back pain. EXAM: CHEST - 2 VIEW COMPARISON:  11/21/2021, chest CT 11/21/2021 FINDINGS: Lungs are adequately inflated and otherwise clear. Known hiatal hernia. Cardiomediastinal silhouette and remainder of the exam is unchanged. IMPRESSION: 1. No acute cardiopulmonary disease. 2. Known hiatal hernia. Electronically Signed   By: Toribio Agreste M.D.   On: 08/30/2023 11:05   CT Cervical Spine Wo Contrast Result Date: 08/30/2023 EXAM: CT CERVICAL SPINE WITHOUT CONTRAST 08/30/2023 10:36:03 AM  TECHNIQUE: CT of the cervical spine was performed without the administration of intravenous contrast. Multiplanar reformatted images are provided for review. Automated exposure control, iterative reconstruction, and/or weight based adjustment of the mA/kV was utilized to reduce the radiation dose to as low as reasonably achievable. COMPARISON: CT of the cervical spine dated 11/21/2021. CLINICAL HISTORY: Neck trauma (Age >= 65y). Pt reports falling 5 days ago but did not come to be seen after fall. Pt reports hitting her head when she fell. Pt stating having pain on back of head, right shoulder and both knees all new from the fall 5 days ago. Pt also complains of diarrhea that started 2-3 days ago. Denies N/V. FINDINGS: CERVICAL SPINE: BONES AND ALIGNMENT: No acute fracture or traumatic malalignment.  There is degenerative grade 1 anterolisthesis at C4-5. There is straightening of the normal cervical lordosis. There is slight degenerative anterolisthesis at C3-4 with moderate bilateral facet arthrosis. DEGENERATIVE CHANGES: There is marked chronic degenerative disc disease at C5-6 and C6-7, with posterior endplate ridging at both levels causing moderate central spinal canal stenosis. There is moderate left neural foraminal stenosis at C5-6. SOFT TISSUES: No prevertebral soft tissue swelling. IMPRESSION: 1. No acute abnormality of the cervical spine related to the reported neck trauma. 2. Marked chronic degenerative disc disease at C5-6 and C6-7 with moderate central spinal canal stenosis and moderate left neural foraminal stenosis at C5-6. Electronically signed by: evalene coho 08/30/2023 10:45 AM EDT RP Workstation: [id number]   CT Head Wo Contrast Result Date: 08/30/2023 EXAM: CT HEAD WITHOUT 08/30/2023 10:36:03 AM TECHNIQUE: CT of the head was performed without the administration of intravenous contrast. Automated exposure control, iterative reconstruction, and/or weight based adjustment of the mA/kV was  utilized to reduce the radiation dose to as low as reasonably achievable. COMPARISON: CT of the head dated 11/21/2021. CLINICAL HISTORY: Facial trauma, blunt. Pt reports falling 5 days ago but did not come to be seen after fall. Pt reports hitting her head when she fell. Pt stating having pain on back of head, right shoulder and both knees all new from the fall 5 days ago. Pt also complains of diarrhea that started 2-3 days ago. Denies N/V. FINDINGS: BRAIN AND VENTRICLES: No acute intracranial hemorrhage. No mass effect or midline shift. No extra-axial fluid collection. Gray-white differentiation is maintained. No hydrocephalus. There is a moderate amount of generalized cerebral volume loss present. There is moderate periventricular white matter disease. ORBITS: The patient is status post bilateral lens replacement. No acute abnormality. SINUSES AND MASTOIDS: No acute abnormality. SOFT TISSUES AND SKULL: No acute skull fracture. No acute soft tissue abnormality. IMPRESSION: 1. No acute intracranial abnormality related to the reported facial trauma. 2. Moderate amount of generalized cerebral volume loss and moderate periventricular white matter disease. Electronically signed by: evalene coho 08/30/2023 10:41 AM EDT RP Workstation: [id number]     Procedures   Medications Ordered in the ED  iohexol  (OMNIPAQUE ) 300 MG/ML solution 100 mL (has no administration in time range)    Clinical Course as of 08/30/23 1430  Sat Aug 30, 2023  [address]): SMALL [JS]  1413 Bacteria, UA(!): MANY [JS]  1413 WBC, UA: 11-20 [JS]    Clinical Course User Index [JS] Delynda Sepulveda, PA-C                                 Medical Decision Making Amount and/or Complexity of Data Reviewed Labs: ordered. Decision-making details documented in ED Course. Radiology: ordered.  Risk Prescription drug management.     This patient presents to the ED for concern of fall, this involves a number of treatment  options, and is a complaint that carries with it a high risk of complications and morbidity.  The differential diagnosis includes chemical versus syncopal.   Co morbidities: Discussed in HPI   Brief History:  See HPI  EMR reviewed including pt PMHx, past surgical history and past visits to ER.   See HPI for more details   Lab Tests:  I ordered and independently interpreted labs.  The pertinent results include:    I personally reviewed all laboratory work and imaging. Metabolic panel without any acute abnormality specifically kidney function within normal limits and no  significant electrolyte abnormalities. CBC without leukocytosis or significant anemia.  UA with moderate amount of blood, small leukocytes, many bacteria and 11-20 white blood cell count, sent for culture.   Imaging Studies:  NAD. I personally reviewed all imaging studies and no acute abnormality found. I agree with radiology interpretation.  Medicines ordered:  N/A  Reevaluation:  After the interventions noted above I re-evaluated patient and found that they have :stayed the same  Social Determinants of Health:  The patient's social determinants of health were a factor in the care of this patient  Problem List / ED Course:  10:24 AM patient arrived to the ED 30 minutes ago, attempted to call Heron on her demographics, unable to reach her therefore history was limited. Patient unable to provide most of the history as she is very hard of hearing.  I did attempt to call the family member which did not answer the phone.  Blood work was ordered as I do not know if this was syncopal versus mechanical.  Blood work on today's visit is within normal limits. Patient does appear to have tenderness throughout her entire body.  I am unsure whether this fall was mechanical versus syncopal as family was unable to be reached by phone.  Labs are benign on today's visit.  A CT of her head, cervical spine were obtained  without any acute findings.  X-ray of her shoulder, pelvis, chest interpreted and reviewed by me.  She has not been given any pain medication while in the ED, she has been resting comfortably. Family member Heron at the bedside, she is very upset that no one has seen patient however I attempted to call her on her cellphone but she did not answer the phone as this was an unknown number. She is very upset at the time I see her in the room, I tried to discussed the results with patient and her family member but she then chooses to write my number down on a paper.  Patient is otherwise hemodynamically stable for discharge at this time.  Workup has been negative.   Dispostion:  After consideration of the diagnostic results and the patients response to treatment, I feel that the patent would benefit from follow-up with primary care physician.      Portions of this note were generated with Scientist, clinical (histocompatibility and immunogenetics). Dictation errors may occur despite best attempts at proofreading.      Final diagnoses:  Fall, initial encounter    ED Discharge Orders     None          Aquilla Voiles, PA-C 08/30/23 1430    Patsey Lot, MD 08/31/23 [phone]

## 2023-09-01 DIAGNOSIS — R8290 Unspecified abnormal findings in urine: Secondary | ICD-10-CM | POA: Diagnosis not present

## 2023-09-01 DIAGNOSIS — R519 Headache, unspecified: Secondary | ICD-10-CM | POA: Diagnosis not present

## 2023-09-01 DIAGNOSIS — E876 Hypokalemia: Secondary | ICD-10-CM | POA: Diagnosis not present

## 2023-09-01 DIAGNOSIS — M542 Cervicalgia: Secondary | ICD-10-CM | POA: Diagnosis not present

## 2023-09-15 DIAGNOSIS — M25511 Pain in right shoulder: Secondary | ICD-10-CM | POA: Diagnosis not present

## 2023-09-16 DIAGNOSIS — N390 Urinary tract infection, site not specified: Secondary | ICD-10-CM | POA: Diagnosis not present

## 2023-09-16 DIAGNOSIS — H6123 Impacted cerumen, bilateral: Secondary | ICD-10-CM | POA: Diagnosis not present

## 2023-09-16 DIAGNOSIS — I10 Essential (primary) hypertension: Secondary | ICD-10-CM | POA: Diagnosis not present

## 2023-09-16 DIAGNOSIS — G4700 Insomnia, unspecified: Secondary | ICD-10-CM | POA: Diagnosis not present

## 2023-10-08 DIAGNOSIS — I10 Essential (primary) hypertension: Secondary | ICD-10-CM | POA: Diagnosis not present

## 2023-10-08 DIAGNOSIS — Z23 Encounter for immunization: Secondary | ICD-10-CM | POA: Diagnosis not present

## 2023-10-08 DIAGNOSIS — G4700 Insomnia, unspecified: Secondary | ICD-10-CM | POA: Diagnosis not present

## 2023-10-08 DIAGNOSIS — N390 Urinary tract infection, site not specified: Secondary | ICD-10-CM | POA: Diagnosis not present

## 2023-10-08 DIAGNOSIS — F322 Major depressive disorder, single episode, severe without psychotic features: Secondary | ICD-10-CM | POA: Diagnosis not present

## 2023-10-21 DIAGNOSIS — N390 Urinary tract infection, site not specified: Secondary | ICD-10-CM | POA: Diagnosis not present

## 2023-10-21 DIAGNOSIS — Z1331 Encounter for screening for depression: Secondary | ICD-10-CM | POA: Diagnosis not present

## 2023-10-21 DIAGNOSIS — Z0000 Encounter for general adult medical examination without abnormal findings: Secondary | ICD-10-CM | POA: Diagnosis not present

## 2023-10-21 DIAGNOSIS — G4700 Insomnia, unspecified: Secondary | ICD-10-CM | POA: Diagnosis not present

## 2023-10-21 DIAGNOSIS — F322 Major depressive disorder, single episode, severe without psychotic features: Secondary | ICD-10-CM | POA: Diagnosis not present

## 2023-10-21 DIAGNOSIS — Z23 Encounter for immunization: Secondary | ICD-10-CM | POA: Diagnosis not present

## 2023-10-21 DIAGNOSIS — I10 Essential (primary) hypertension: Secondary | ICD-10-CM | POA: Diagnosis not present

## 2023-11-03 ENCOUNTER — Encounter: Payer: Self-pay | Admitting: Podiatry

## 2023-11-03 ENCOUNTER — Ambulatory Visit (INDEPENDENT_AMBULATORY_CARE_PROVIDER_SITE_OTHER): Admitting: Podiatry

## 2023-11-03 DIAGNOSIS — M79609 Pain in unspecified limb: Secondary | ICD-10-CM

## 2023-11-03 DIAGNOSIS — B351 Tinea unguium: Secondary | ICD-10-CM

## 2023-11-05 NOTE — Progress Notes (Signed)
 Subjective:   Patient ID: [name], female   DOB: 89 y.o.   MRN: [id number]   HPI Patient presents with caregiver with elongated nailbeds 1-5 both feet that she cannot take care of   ROS      Objective:  Physical Exam  Neurovascular status intact elongated thickened nailbeds 1-5 both feet painful     Assessment:  Chronic mycotic nail infections with pain 1-5 both feet     Plan:  Debridement accomplished no iatrogenic bleeding reappoint routine care

## 2023-11-11 DIAGNOSIS — H05232 Hemorrhage of left orbit: Secondary | ICD-10-CM | POA: Diagnosis not present

## 2023-12-16 DIAGNOSIS — G4700 Insomnia, unspecified: Secondary | ICD-10-CM | POA: Diagnosis not present

## 2023-12-16 DIAGNOSIS — Z5181 Encounter for therapeutic drug level monitoring: Secondary | ICD-10-CM | POA: Diagnosis not present

## 2023-12-16 DIAGNOSIS — W19XXXA Unspecified fall, initial encounter: Secondary | ICD-10-CM | POA: Diagnosis not present

## 2023-12-16 DIAGNOSIS — F322 Major depressive disorder, single episode, severe without psychotic features: Secondary | ICD-10-CM | POA: Diagnosis not present

## 2023-12-16 DIAGNOSIS — I10 Essential (primary) hypertension: Secondary | ICD-10-CM | POA: Diagnosis not present

## 2024-01-27 ENCOUNTER — Encounter (HOSPITAL_COMMUNITY): Payer: Self-pay

## 2024-01-27 ENCOUNTER — Emergency Department (HOSPITAL_COMMUNITY)

## 2024-01-27 ENCOUNTER — Other Ambulatory Visit: Payer: Self-pay

## 2024-01-27 ENCOUNTER — Observation Stay (HOSPITAL_COMMUNITY)
Admission: EM | Admit: 2024-01-27 | Discharge: 2024-01-30 | Disposition: A | Discharge status: Home / Self Care | Attending: Internal Medicine | Admitting: Internal Medicine

## 2024-01-27 DIAGNOSIS — Z79899 Other long term (current) drug therapy: Secondary | ICD-10-CM | POA: Insufficient documentation

## 2024-01-27 DIAGNOSIS — F39 Unspecified mood [affective] disorder: Secondary | ICD-10-CM | POA: Diagnosis not present

## 2024-01-27 DIAGNOSIS — K219 Gastro-esophageal reflux disease without esophagitis: Secondary | ICD-10-CM | POA: Diagnosis not present

## 2024-01-27 DIAGNOSIS — K449 Diaphragmatic hernia without obstruction or gangrene: Secondary | ICD-10-CM | POA: Diagnosis not present

## 2024-01-27 DIAGNOSIS — N183 Chronic kidney disease, stage 3 unspecified: Secondary | ICD-10-CM

## 2024-01-27 DIAGNOSIS — I130 Hypertensive heart and chronic kidney disease with heart failure and stage 1 through stage 4 chronic kidney disease, or unspecified chronic kidney disease: Secondary | ICD-10-CM | POA: Diagnosis not present

## 2024-01-27 DIAGNOSIS — I2510 Atherosclerotic heart disease of native coronary artery without angina pectoris: Secondary | ICD-10-CM | POA: Insufficient documentation

## 2024-01-27 DIAGNOSIS — I272 Pulmonary hypertension, unspecified: Secondary | ICD-10-CM | POA: Insufficient documentation

## 2024-01-27 DIAGNOSIS — E876 Hypokalemia: Secondary | ICD-10-CM | POA: Insufficient documentation

## 2024-01-27 DIAGNOSIS — K625 Hemorrhage of anus and rectum: Principal | ICD-10-CM

## 2024-01-27 DIAGNOSIS — I10 Essential (primary) hypertension: Secondary | ICD-10-CM | POA: Diagnosis not present

## 2024-01-27 DIAGNOSIS — I5020 Unspecified systolic (congestive) heart failure: Secondary | ICD-10-CM | POA: Diagnosis not present

## 2024-01-27 DIAGNOSIS — K922 Gastrointestinal hemorrhage, unspecified: Principal | ICD-10-CM | POA: Insufficient documentation

## 2024-01-27 DIAGNOSIS — N1831 Chronic kidney disease, stage 3a: Secondary | ICD-10-CM | POA: Insufficient documentation

## 2024-01-27 DIAGNOSIS — R42 Dizziness and giddiness: Secondary | ICD-10-CM | POA: Insufficient documentation

## 2024-01-27 LAB — CBC WITH DIFFERENTIAL/PLATELET
Abs Immature Granulocytes: 0.15 K/uL — ABNORMAL HIGH (ref 0.00–0.07)
Basophils Absolute: 0 K/uL (ref 0.0–0.1)
Basophils Relative: 0 %
Eosinophils Absolute: 0 K/uL (ref 0.0–0.5)
Eosinophils Relative: 0 %
HCT: 30.5 % — ABNORMAL LOW (ref 36.0–46.0)
Hemoglobin: 9.4 g/dL — ABNORMAL LOW (ref 12.0–15.0)
Immature Granulocytes: 1 %
Lymphocytes Relative: 11 %
Lymphs Abs: 1.2 K/uL (ref 0.7–4.0)
MCH: 28.8 pg (ref 26.0–34.0)
MCHC: 30.8 g/dL (ref 30.0–36.0)
MCV: 93.6 fL (ref 80.0–100.0)
Monocytes Absolute: 1 K/uL (ref 0.1–1.0)
Monocytes Relative: 9 %
Neutro Abs: 8.9 K/uL — ABNORMAL HIGH (ref 1.7–7.7)
Neutrophils Relative %: 79 %
Platelets: 168 K/uL (ref 150–400)
RBC: 3.26 MIL/uL — ABNORMAL LOW (ref 3.87–5.11)
RDW: 15.9 % — ABNORMAL HIGH (ref 11.5–15.5)
WBC: 11.2 K/uL — ABNORMAL HIGH (ref 4.0–10.5)
nRBC: 0 % (ref 0.0–0.2)

## 2024-01-27 LAB — COMPREHENSIVE METABOLIC PANEL WITH GFR
ALT: <5 U/L (ref 0–44)
AST: 16 U/L (ref 15–41)
Albumin: 3.6 g/dL (ref 3.5–5.0)
Alkaline Phosphatase: 66 U/L (ref 38–126)
Anion gap: 9 (ref 5–15)
BUN: 13 mg/dL (ref 8–23)
CO2: 27 mmol/L (ref 22–32)
Calcium: 8.8 mg/dL — ABNORMAL LOW (ref 8.9–10.3)
Chloride: 109 mmol/L (ref 98–111)
Creatinine, Ser: 0.85 mg/dL (ref 0.44–1.00)
GFR, Estimated: 59 mL/min — ABNORMAL LOW
Glucose, Bld: 125 mg/dL — ABNORMAL HIGH (ref 70–99)
Potassium: 3.4 mmol/L — ABNORMAL LOW (ref 3.5–5.1)
Sodium: 145 mmol/L (ref 135–145)
Total Bilirubin: 0.4 mg/dL (ref 0.0–1.2)
Total Protein: 7.2 g/dL (ref 6.5–8.1)

## 2024-01-27 LAB — POC OCCULT BLOOD, ED: Fecal Occult Bld: POSITIVE — AB

## 2024-01-27 MED ORDER — SODIUM CHLORIDE 0.9 % IV SOLN
2.0000 g | Freq: Once | INTRAVENOUS | Status: AC
  Administered 2024-01-27: 2 g via INTRAVENOUS
  Filled 2024-01-27: qty 20

## 2024-01-27 MED ORDER — ACETAMINOPHEN 325 MG PO TABS: 650.0000 mg | ORAL_TABLET | Freq: Four times a day (QID) | ORAL | Status: DC | PRN

## 2024-01-27 MED ORDER — PANTOPRAZOLE SODIUM 40 MG IV SOLR
40.0000 mg | Freq: Once | INTRAVENOUS | Status: AC
  Administered 2024-01-27: 40 mg via INTRAVENOUS
  Filled 2024-01-27: qty 10

## 2024-01-27 MED ORDER — POTASSIUM CHLORIDE CRYS ER 10 MEQ PO TBCR
40.0000 meq | EXTENDED_RELEASE_TABLET | Freq: Once | ORAL | Status: AC
  Administered 2024-01-28: 40 meq via ORAL
  Filled 2024-01-27: qty 4

## 2024-01-27 MED ORDER — FUROSEMIDE 20 MG PO TABS
10.0000 mg | ORAL_TABLET | ORAL | Status: DC
  Administered 2024-01-28: 10 mg via ORAL
  Filled 2024-01-27: qty 1

## 2024-01-27 MED ORDER — BISACODYL 5 MG PO TBEC: 5.0000 mg | DELAYED_RELEASE_TABLET | Freq: Every day | ORAL | Status: DC | PRN

## 2024-01-27 MED ORDER — PAROXETINE HCL 10 MG PO TABS
10.0000 mg | ORAL_TABLET | Freq: Every day | ORAL | Status: DC
  Administered 2024-01-28 – 2024-01-30 (×2): 10 mg via ORAL
  Filled 2024-01-27 (×3): qty 1

## 2024-01-27 MED ORDER — POLYETHYL GLYCOL-PROPYL GLYCOL 0.4-0.3 % OP SOLN: 1.0000 [drp] | Freq: Two times a day (BID) | OPHTHALMIC | Status: DC

## 2024-01-27 MED ORDER — PANTOPRAZOLE SODIUM 40 MG PO TBEC
40.0000 mg | DELAYED_RELEASE_TABLET | ORAL | Status: DC
  Administered 2024-01-28: 40 mg via ORAL
  Filled 2024-01-27: qty 1

## 2024-01-27 MED ORDER — ACETAMINOPHEN 650 MG RE SUPP: 650.0000 mg | Freq: Four times a day (QID) | RECTAL | Status: DC | PRN

## 2024-01-27 MED ORDER — ONDANSETRON HCL 4 MG/2ML IJ SOLN: 4.0000 mg | Freq: Four times a day (QID) | INTRAMUSCULAR | Status: DC | PRN

## 2024-01-27 MED ORDER — AMLODIPINE BESYLATE 5 MG PO TABS
2.5000 mg | ORAL_TABLET | Freq: Every day | ORAL | Status: DC
  Administered 2024-01-28: 2.5 mg via ORAL
  Filled 2024-01-27 (×3): qty 1

## 2024-01-27 MED ORDER — POLYVINYL ALCOHOL 1.4 % OP SOLN
1.0000 [drp] | Freq: Two times a day (BID) | OPHTHALMIC | Status: DC
  Administered 2024-01-28 – 2024-01-30 (×4): 1 [drp] via OPHTHALMIC
  Filled 2024-01-27: qty 15

## 2024-01-27 MED ORDER — POTASSIUM CHLORIDE CRYS ER 10 MEQ PO TBCR
20.0000 meq | EXTENDED_RELEASE_TABLET | Freq: Every day | ORAL | Status: DC
  Administered 2024-01-28 – 2024-01-30 (×2): 20 meq via ORAL
  Filled 2024-01-27 (×7): qty 2

## 2024-01-27 MED ORDER — METOPROLOL SUCCINATE ER 25 MG PO TB24
12.5000 mg | ORAL_TABLET | Freq: Every day | ORAL | Status: DC
  Administered 2024-01-28 – 2024-01-30 (×2): 12.5 mg via ORAL
  Filled 2024-01-27 (×4): qty 1

## 2024-01-27 MED ORDER — SENNOSIDES-DOCUSATE SODIUM 8.6-50 MG PO TABS: 1.0000 | ORAL_TABLET | Freq: Every evening | ORAL | Status: DC | PRN

## 2024-01-27 MED ORDER — ISOSORBIDE MONONITRATE ER 30 MG PO TB24
15.0000 mg | ORAL_TABLET | Freq: Every day | ORAL | Status: DC
  Administered 2024-01-28 – 2024-01-30 (×2): 15 mg via ORAL
  Filled 2024-01-27 (×4): qty 1

## 2024-01-27 MED ORDER — FERROUS SULFATE 325 (65 FE) MG PO TABS
325.0000 mg | ORAL_TABLET | Freq: Every day | ORAL | Status: DC
  Administered 2024-01-28 – 2024-01-30 (×2): 325 mg via ORAL
  Filled 2024-01-27 (×4): qty 1

## 2024-01-27 MED ORDER — ONDANSETRON HCL 4 MG PO TABS: 4.0000 mg | ORAL_TABLET | Freq: Four times a day (QID) | ORAL | Status: DC | PRN

## 2024-01-27 MED ORDER — SODIUM CHLORIDE 0.9 % IV BOLUS
500.0000 mL | Freq: Once | INTRAVENOUS | Status: AC
  Administered 2024-01-27: 500 mL via INTRAVENOUS

## 2024-01-27 NOTE — H&P (Signed)
 " History and Physical  [name] FMW:[id number] DOB: [birth year] DOA: 01/27/2024  PCP: Rexanne Ingle, MD   Chief Complaint: Rectal bleeding  HPI: [name] is a 89 y.o. female with medical history significant for HTN, CAD, HFrEF, pulmonary hypertension, HLD, DJD, CKD stage IIIA, vertigo, GERD and IDA who presented to the ED for evaluation of rectal bleeding. Patient reports that recently, she has felt increased fatigue. Today, she noticed bleeding from her rectum with associated large blood clots. She also reports some dizziness and generalized weakness but denies any rectal pain, abdominal pain, nausea, vomiting, syncope, shortness of breath, hematuria or hematemesis.  ED Course: Initial vitals show patient afebrile and normotensive. Initial labs significant for WBC 11.2, Hgb 9.4, K+ 3.4, positive FOBT and normal renal function. CT A/P shows a large hiatal hernia but no acute intra-abdominal abnormality. GI (Dr. Kriss) was consulted for evaluation. [facility] was consulted for admission.   Review of Systems: Please see HPI for pertinent positives and negatives. A complete 10 system review of systems are otherwise negative.  Past Medical History:  Diagnosis Date   Allergic rhinitis    CAD (coronary artery disease)    CHF (congestive heart failure) (HCC)    CKD (chronic kidney disease) stage 3, GFR 30-59 ml/min (HCC) 09/12/2014   Diverticular disease    Diverticulosis    DJD (degenerative joint disease) of knee    DJD (degenerative joint disease) of lumbar spine    scoliosis   Eczema    Esophageal erosions    from nsaid    GI bleed    Hormone replacement therapy (postmenopausal)    Hyperglycemia    Hyperlipidemia    Hypertension    Pneumonia    july 2009   Pulmonary hypertension (HCC)    a. 2D echo 03/2008 showed normal biventricular thickness, chamber size and systolic function, EF 50-55%, mild mitral regurgitation, aortic sclerosis, moderate pulmonary HTN, pulmonic  regurgitation.   PVC's (premature ventricular contractions)    Reactive depression (situational)    Sciatica    right lower extremity   Shingles    right thoracic 2008   Vertigo    Past Surgical History:  Procedure Laterality Date   APPENDECTOMY     BACK SURGERY     BREAST LUMPECTOMY     FLEXIBLE SIGMOIDOSCOPY Left 08/13/2013   Procedure: FLEXIBLE SIGMOIDOSCOPY;  Surgeon: Elsie Cree, MD;  Location: [facility];  Service: Endoscopy;  Laterality: Left;   TOTAL ABDOMINAL HYSTERECTOMY     Social History:  reports that she has never smoked. She has never used smokeless tobacco. She reports current alcohol  use. She reports that she does not use drugs.  Allergies[1]  Family History  Problem Relation Age of Onset   Heart disease Father    Heart attack Father    Heart disease Mother    Heart attack Mother    Heart disease Daughter    Heart disease Son    Cancer Brother    Heart disease Sister    Heart attack Sister        X2   Heart attack Brother    Hypertension Brother    Stroke Neg Hx      Prior to Admission medications  Medication Sig Start Date End Date Taking? Authorizing Provider  acetaminophen  (TYLENOL ) 325 MG tablet Take 650 mg by mouth every 6 (six) hours as needed for mild pain (pain score 1-3) or moderate pain (pain score 4-6).   Yes [provider]  amLODipine  (NORVASC )  2.5 MG tablet Take 2.5 mg by mouth daily. 10/11/21  Yes [provider]  ferrous sulfate 325 (65 FE) MG tablet Take 325 mg by mouth daily with breakfast.   Yes [provider]  furosemide  (LASIX ) 20 MG tablet Take 10 mg by mouth See admin instructions.   Yes [provider]  isosorbide  mononitrate (IMDUR ) 30 MG 24 hr tablet Take 0.5 tablets (15 mg total) by mouth daily. 11/26/21  Yes Jerri Keys, MD  meclizine  (ANTIVERT ) 25 MG tablet Take 25 mg by mouth 2 (two) times daily as needed for dizziness. 09/28/21  Yes [provider]  metoprolol  succinate  (TOPROL -XL) 25 MG 24 hr tablet Take 0.5 tablets (12.5 mg total) by mouth daily. 11/26/21  Yes Jerri Keys, MD  pantoprazole  (PROTONIX ) 40 MG tablet Take 40 mg by mouth every Monday, Wednesday, and Friday.   Yes [provider]  PARoxetine  (PAXIL ) 10 MG tablet Take 10 mg by mouth daily. 03/10/20  Yes [provider]  polyethylene glycol (MIRALAX ) packet Take 17 g by mouth daily as needed for mild constipation. 08/21/17  Yes Luke Agent, MD  isosorbide  dinitrate (ISORDIL ) 30 MG tablet Take by mouth. 08/19/23   [provider]  lidocaine  (LIDODERM ) 5 % Place 1 patch onto the skin daily. Remove & Discard patch within 12 hours or as directed by MD 11/25/21   Jerri Keys, MD  loperamide  (IMODIUM ) 2 MG capsule Take 2 mg by mouth every Monday, Wednesday, and Friday.    [provider]  magnesium  oxide (MAG-OX) 400 (240 Mg) MG tablet Take 1 tablet (400 mg total) by mouth daily. Patient not taking: Reported on 01/27/2024 11/24/21   Jerri Keys, MD  potassium chloride  (KLOR-CON ) 10 MEQ tablet Take 10 mEq by mouth 2 (two) times daily.    [provider]  PREMARIN  0.3 MG tablet Take 0.15 mg by mouth daily. 10/31/21   [provider]  Prenatal Vit-Fe Fumarate-FA (PRENATAL MULTIVITAMIN) TABS tablet Take 1 tablet by mouth daily at 12 noon.    [provider]  Propylene Glycol (SYSTANE BALANCE) 0.6 % SOLN Place 1 drop into both eyes daily as needed (dry eyes).    [provider]  traZODone  (DESYREL ) 50 MG tablet Take 25-50 mg by mouth at bedtime as needed for sleep.    [provider]    Physical Exam: BP 126/66   Pulse 71   Temp 97.6 F (36.4 C) (Oral)   Resp 20   SpO2 100%  General: Pleasant, well-appearing elderly woman laying in bed. No acute distress. HEENT: [place]/AT. Anicteric sclera CV: RRR. No murmurs, rubs, or gallops. No LE edema Pulmonary: Lungs CTAB. Normal effort. No wheezing or rales. Abdominal: Soft, nontender, nondistended.  Normal bowel sounds. Extremities: Palpable radial and DP pulses. Normal ROM. Skin: Warm and dry. No obvious rash or lesions. Neuro: A&Ox3. Moves all extremities. Normal sensation to light touch. No focal deficit. Psych: Normal mood and affect          Labs on Admission:  Basic Metabolic Panel: Recent Labs  Lab 01/27/24 1817  NA 145  K 3.4*  CL 109  CO2 27  GLUCOSE 125*  BUN 13  CREATININE 0.85  CALCIUM 8.8*   Liver Function Tests: Recent Labs  Lab 01/27/24 1817  AST 16  ALT <5  ALKPHOS 66  BILITOT 0.4  PROT 7.2  ALBUMIN 3.6   No results for input(s): LIPASE, AMYLASE in the last 168 hours. No results for input(s): AMMONIA in  the last 168 hours. CBC: Recent Labs  Lab 01/27/24 1817  WBC 11.2*  NEUTROABS 8.9*  HGB 9.4*  HCT 30.5*  MCV 93.6  PLT 168   Cardiac Enzymes: No results for input(s): CKTOTAL, CKMB, CKMBINDEX, TROPONINI in the last 168 hours. BNP (last 3 results) No results for input(s): BNP in the last 8760 hours.  ProBNP (last 3 results) No results for input(s): PROBNP in the last 8760 hours.  CBG: No results for input(s): GLUCAP in the last 168 hours.  Radiological Exams on Admission: CT ABDOMEN PELVIS WO CONTRAST Result Date: 01/27/2024 CLINICAL DATA:  Bright red blood per rectum EXAM: CT ABDOMEN AND PELVIS WITHOUT CONTRAST TECHNIQUE: Multidetector CT imaging of the abdomen and pelvis was performed following the standard protocol without IV contrast. RADIATION DOSE REDUCTION: This exam was performed according to the departmental dose-optimization program which includes automated exposure control, adjustment of the mA and/or kV according to patient size and/or use of iterative reconstruction technique. COMPARISON:  CT abdomen and pelvis dated 08/30/2023 FINDINGS: Lower chest: Bilateral lower lobe atelectasis. No pleural effusion or pneumothorax demonstrated. Partially imaged heart size is normal. Hepatobiliary: No focal hepatic  lesions. No intra or extrahepatic biliary ductal dilation. Normal gallbladder. Pancreas: No focal lesions or main ductal dilation. Spleen: Normal in size without focal abnormality. Adrenals/Urinary Tract: No adrenal nodules. No suspicious renal mass, calculi or hydronephrosis. No focal bladder wall thickening. Stomach/Bowel: Partially imaged large hiatal hernia. No evidence of bowel wall thickening, distention, or inflammatory changes. Extensive colonic diverticulosis without acute diverticulitis. Appendix is not discretely seen. Vascular/Lymphatic: Aortic atherosclerosis. No enlarged abdominal or pelvic lymph nodes. Reproductive: No adnexal masses. Other: No free fluid, fluid collection, or free air. Musculoskeletal: No acute or abnormal lytic or blastic osseous lesions. Multilevel degenerative changes of the partially imaged thoracic and lumbar spine. Unchanged ankylosis of T12-L2. IMPRESSION: 1. Extensive colonic diverticulosis without acute diverticulitis. 2. Partially imaged large hiatal hernia. 3.  Aortic Atherosclerosis (ICD10-I70.0). Electronically Signed   By: Limin  Xu M.D.   On: 01/27/2024 17:43   Assessment/Plan [name] is a 89 y.o. female with medical history significant for  HTN, CAD, HFrEF, pulmonary hypertension, HLD, DJD, CKD stage IIIA, vertigo, GERD and IDA who presented to the ED for evaluation of rectal bleeding and admitted for GI bleed.  # Rectal bleeding # GI bleed - Slight drop in Hgb to 9.4 on admission from baseline of 10-12 - Pt presented with 1 day of rectal bleeding with blood clots - FOBT positive, Pt hemodynamically stable - GI consulted for evaluation, will see in a.m. - Follow up iron  studies, ferritin, vitamin B12 and GIP - Trend CBC and Transfuse for Hgb goal > 7 - Continue iron  supplementation  # Hypokalemia - Chronic problem, K+ slightly low at 3.4 - Give KCl 40 mEq x 1 - Continue home potassium supplementation  # HTN - BP stable with SBP in the  110-130s - Continue amlodipine , Imdur  and Toprol -XL  # HFrEF # Pulmonary hypertension - Patient euvolemic on exam - Continue Toprol  XL, and lasix   # CKD 3A - Creatinine remains within normal limits at 0.85 with normal BUN - Trend renal function, avoid nephrotoxic agents  # GERD - Continue Protonix   # Mood disorder - Continue Paxil    DVT prophylaxis: SCDs    Code Status: Limited: Do not attempt resuscitation (DNR) -DNR-LIMITED -Do Not Intubate/DNI   Consults called: GI  Family Communication: Discussed findings/results and plan for admission with niece at bedside  Severity of  Illness: The appropriate patient status for this patient is OBSERVATION. Observation status is judged to be reasonable and necessary in order to provide the required intensity of service to ensure the patient's safety. The patient's presenting symptoms, physical exam findings, and initial radiographic and laboratory data in the context of their medical condition is felt to place them at decreased risk for further clinical deterioration. Furthermore, it is anticipated that the patient will be medically stable for discharge from the hospital within 2 midnights of admission.   Level of care: Med-Surg    Morgan Claretta HERO, MD 01/27/2024, 9:09 PM [facility] Pager: [phone] Isaiah 41:10   If 7PM-7AM, please contact night-coverage [email or web address] Password [email or web address]     [1]  Allergies Allergen Reactions   Ace Inhibitors Hives and Itching   Streptomycin Itching and Other (See Comments)    Pruritus    Tape Other (See Comments)    SKIN IS VERY DELICATE- BRUISES AND TEARS VERY EASILY!!   Tramadol Other (See Comments)    Unknown reaction   Vesicare [Solifenacin Succinate] Itching   Penicillins Itching, Rash and Other (See Comments)    Pruritic rash   "

## 2024-01-27 NOTE — ED Provider Notes (Signed)
 " [place] EMERGENCY DEPARTMENT AT [facility] Provider Note   CSN: [id number] Arrival date & time: 01/27/24  1617     Patient presents with: Rectal Bleeding   [name] is a 89 y.o. female.   Patient presents with rectal bleeding today.  She states that it was dark blood.  She has no complaints of any abdominal pain.  She had rectal bleeding a few months ago also.  The history is provided by the patient and medical records. No language interpreter was used.  Rectal Bleeding Quality:  [place] Amount:  Moderate Timing:  Intermittent Chronicity:  New Context: not anal fissures   Similar prior episodes: yes   Relieved by:  Nothing Worsened by:  Nothing Ineffective treatments:  None tried Associated symptoms: no abdominal pain        Prior to Admission medications  Medication Sig Start Date End Date Taking? Authorizing Provider  acetaminophen  (TYLENOL ) 325 MG tablet Take 650 mg by mouth daily as needed for mild pain (pain score 1-3) or moderate pain (pain score 4-6).    [provider]  amLODipine  (NORVASC ) 2.5 MG tablet Take 2.5 mg by mouth daily. 10/11/21   [provider]  furosemide  (LASIX ) 40 MG tablet Take 20 mg by mouth See admin instructions. MON WED FRI SAT 10/31/21   [provider]  isosorbide  dinitrate (ISORDIL ) 30 MG tablet Take by mouth. 08/19/23   [provider]  isosorbide  mononitrate (IMDUR ) 30 MG 24 hr tablet Take 0.5 tablets (15 mg total) by mouth daily. 11/26/21   Jerri Keys, MD  lidocaine  (LIDODERM ) 5 % Place 1 patch onto the skin daily. Remove & Discard patch within 12 hours or as directed by MD 11/25/21   Jerri Keys, MD  loperamide  (IMODIUM ) 2 MG capsule Take 2 mg by mouth every Monday, Wednesday, and Friday.    [provider]  magnesium  oxide (MAG-OX) 400 (240 Mg) MG tablet Take 1 tablet (400 mg total) by mouth daily. 11/24/21   Jerri Keys, MD  meclizine  (ANTIVERT ) 25 MG tablet Take 25 mg by mouth  2 (two) times daily as needed for dizziness. 09/28/21   [provider]  metoprolol  succinate (TOPROL -XL) 25 MG 24 hr tablet Take 0.5 tablets (12.5 mg total) by mouth daily. 11/26/21   Jerri Keys, MD  pantoprazole  (PROTONIX ) 40 MG tablet Take 40 mg by mouth every Monday, Wednesday, and Friday.    [provider]  PARoxetine  (PAXIL ) 10 MG tablet Take 10 mg by mouth daily. 03/10/20   [provider]  polyethylene glycol (MIRALAX ) packet Take 17 g by mouth daily as needed for mild constipation. 08/21/17   Luke Agent, MD  potassium chloride  (KLOR-CON ) 10 MEQ tablet Take 10 mEq by mouth 2 (two) times daily.    [provider]  potassium chloride  SA (KLOR-CON  M) 20 MEQ tablet Take 20 mEq by mouth daily.    [provider]  PREMARIN  0.3 MG tablet Take 0.15 mg by mouth daily. 10/31/21   [provider]  Prenatal Vit-Fe Fumarate-FA (PRENATAL MULTIVITAMIN) TABS tablet Take 1 tablet by mouth daily at 12 noon.    [provider]  Propylene Glycol (SYSTANE BALANCE) 0.6 % SOLN Place 1 drop into both eyes daily as needed (dry eyes).    [provider]  traZODone  (DESYREL ) 50 MG tablet Take 25-50 mg by mouth at bedtime as needed for sleep.    [provider]    Allergies: Ace inhibitors, Streptomycin, Tramadol, Vesicare [  solifenacin succinate], and Penicillins    Review of Systems  Constitutional:  Negative for appetite change and fatigue.  HENT:  Negative for congestion, ear discharge and sinus pressure.   Eyes:  Negative for discharge.  Respiratory:  Negative for cough.   Cardiovascular:  Negative for chest pain.  Gastrointestinal:  Positive for hematochezia. Negative for abdominal pain and diarrhea.       Rectal bleeding  Genitourinary:  Negative for frequency and hematuria.  Musculoskeletal:  Negative for back pain.  Skin:  Negative for rash.  Neurological:  Negative for seizures and headaches.  Psychiatric/Behavioral:  Negative  for hallucinations.     Updated Vital Signs BP 110/74 (BP Location: Right Arm)   Pulse 76   Temp 97.6 F (36.4 C) (Oral)   Resp 16   SpO2 100%   Physical Exam Vitals and nursing note reviewed.  Constitutional:      Appearance: She is well-developed.  HENT:     Head: Normocephalic.     Nose: Nose normal.  Eyes:     General: No scleral icterus.    Conjunctiva/sclera: Conjunctivae normal.  Neck:     Thyroid : No thyromegaly.  Cardiovascular:     Rate and Rhythm: Normal rate and regular rhythm.     Heart sounds: No murmur heard.    No friction rub. No gallop.  Pulmonary:     Breath sounds: No stridor. No wheezing or rales.  Chest:     Chest wall: No tenderness.  Abdominal:     General: There is no distension.     Tenderness: There is no abdominal tenderness. There is no rebound.  Genitourinary:    Comments: Dark brown stool is heme positive Musculoskeletal:        General: Normal range of motion.     Cervical back: Neck supple.  Lymphadenopathy:     Cervical: No cervical adenopathy.  Skin:    Findings: No erythema or rash.  Neurological:     Mental Status: She is alert and oriented to person, place, and time.     Motor: No abnormal muscle tone.     Coordination: Coordination normal.  Psychiatric:        Behavior: Behavior normal.     (all labs ordered are listed, but only abnormal results are displayed) Labs Reviewed  CBC WITH DIFFERENTIAL/PLATELET - Abnormal; Notable for the following components:      Result Value   WBC 11.2 (*)    RBC 3.26 (*)    Hemoglobin 9.4 (*)    HCT 30.5 (*)    RDW 15.9 (*)    Neutro Abs 8.9 (*)    Abs Immature Granulocytes 0.15 (*)    All other components within normal limits  COMPREHENSIVE METABOLIC PANEL WITH GFR - Abnormal; Notable for the following components:   Potassium 3.4 (*)    Glucose, Bld 125 (*)    Calcium 8.8 (*)    GFR, Estimated 59 (*)    All other components within normal limits  POC OCCULT BLOOD, ED -  Abnormal; Notable for the following components:   Fecal Occult Bld POSITIVE (*)    All other components within normal limits  GASTROINTESTINAL PANEL BY PCR, STOOL (REPLACES STOOL CULTURE)    EKG: None  Radiology: CT ABDOMEN PELVIS WO CONTRAST Result Date: 01/27/2024 CLINICAL DATA:  Bright red blood per rectum EXAM: CT ABDOMEN AND PELVIS WITHOUT CONTRAST TECHNIQUE: Multidetector CT imaging of the abdomen and pelvis was performed following the standard protocol without IV contrast.  RADIATION DOSE REDUCTION: This exam was performed according to the departmental dose-optimization program which includes automated exposure control, adjustment of the mA and/or kV according to patient size and/or use of iterative reconstruction technique. COMPARISON:  CT abdomen and pelvis dated 08/30/2023 FINDINGS: Lower chest: Bilateral lower lobe atelectasis. No pleural effusion or pneumothorax demonstrated. Partially imaged heart size is normal. Hepatobiliary: No focal hepatic lesions. No intra or extrahepatic biliary ductal dilation. Normal gallbladder. Pancreas: No focal lesions or main ductal dilation. Spleen: Normal in size without focal abnormality. Adrenals/Urinary Tract: No adrenal nodules. No suspicious renal mass, calculi or hydronephrosis. No focal bladder wall thickening. Stomach/Bowel: Partially imaged large hiatal hernia. No evidence of bowel wall thickening, distention, or inflammatory changes. Extensive colonic diverticulosis without acute diverticulitis. Appendix is not discretely seen. Vascular/Lymphatic: Aortic atherosclerosis. No enlarged abdominal or pelvic lymph nodes. Reproductive: No adnexal masses. Other: No free fluid, fluid collection, or free air. Musculoskeletal: No acute or abnormal lytic or blastic osseous lesions. Multilevel degenerative changes of the partially imaged thoracic and lumbar spine. Unchanged ankylosis of T12-L2. IMPRESSION: 1. Extensive colonic diverticulosis without acute  diverticulitis. 2. Partially imaged large hiatal hernia. 3.  Aortic Atherosclerosis (ICD10-I70.0). Electronically Signed   By: Limin  Xu M.D.   On: 01/27/2024 17:43     Procedures   Medications Ordered in the ED  cefTRIAXone  (ROCEPHIN ) 2 g in sodium chloride  0.9 % 100 mL IVPB (has no administration in time range)  pantoprazole  (PROTONIX ) injection 40 mg (40 mg Intravenous Given 01/27/24 1934)  sodium chloride  0.9 % bolus 500 mL (500 mLs Intravenous New Bag/Given 01/27/24 1934)                                    Medical Decision Making Amount and/or Complexity of Data Reviewed Labs: ordered. Radiology: ordered.  Risk Prescription drug management. Decision regarding hospitalization.   Patient with moderate rectal bleeding and mild anemia.  She is admitted to medicine and will have GI consult tomorrow     Final diagnoses:  None    ED Discharge Orders     None          Suzette Pac, MD 01/27/24 1937  "

## 2024-01-27 NOTE — ED Triage Notes (Signed)
 BIB EMS due to rectal bleeding ? Due to hemorrhoids pt has bright red blood with clots, dizziness, malaise and generalized weakness. This has happened in the past.  150/76-140-18 CBG 167- 100% RA #22 L FA with fluids  Pt moved watch from left to right wrist, pt told EMS she had rings (2) on fingers which were not witnessed or removed by EMS.

## 2024-01-28 DIAGNOSIS — E876 Hypokalemia: Secondary | ICD-10-CM

## 2024-01-28 DIAGNOSIS — K922 Gastrointestinal hemorrhage, unspecified: Secondary | ICD-10-CM | POA: Diagnosis not present

## 2024-01-28 LAB — IRON AND TIBC
Iron: 27 ug/dL — ABNORMAL LOW (ref 28–170)
Saturation Ratios: 9 % — ABNORMAL LOW (ref 10.4–31.8)
TIBC: 301 ug/dL (ref 250–450)
UIBC: 275 ug/dL

## 2024-01-28 LAB — GASTROINTESTINAL PANEL BY PCR, STOOL (REPLACES STOOL CULTURE)

## 2024-01-28 LAB — CBC
HCT: 28.9 % — ABNORMAL LOW (ref 36.0–46.0)
Hemoglobin: 9.1 g/dL — ABNORMAL LOW (ref 12.0–15.0)
MCH: 29.4 pg (ref 26.0–34.0)
MCHC: 31.5 g/dL (ref 30.0–36.0)
MCV: 93.5 fL (ref 80.0–100.0)
Platelets: 152 K/uL (ref 150–400)
RBC: 3.09 MIL/uL — ABNORMAL LOW (ref 3.87–5.11)
RDW: 16 % — ABNORMAL HIGH (ref 11.5–15.5)
WBC: 9 K/uL (ref 4.0–10.5)
nRBC: 0 % (ref 0.0–0.2)

## 2024-01-28 LAB — BASIC METABOLIC PANEL WITH GFR
Anion gap: 15 (ref 5–15)
BUN: 16 mg/dL (ref 8–23)
CO2: 18 mmol/L — ABNORMAL LOW (ref 22–32)
Calcium: 8.7 mg/dL — ABNORMAL LOW (ref 8.9–10.3)
Chloride: 111 mmol/L (ref 98–111)
Creatinine, Ser: 0.75 mg/dL (ref 0.44–1.00)
GFR, Estimated: >60 mL/min
Glucose, Bld: 79 mg/dL (ref 70–99)
Potassium: 3.5 mmol/L (ref 3.5–5.1)
Sodium: 143 mmol/L (ref 135–145)

## 2024-01-28 LAB — MAGNESIUM: Magnesium: 2.2 mg/dL (ref 1.7–2.4)

## 2024-01-28 LAB — FERRITIN: Ferritin: 15 ng/mL (ref 11–307)

## 2024-01-28 LAB — VITAMIN B12: Vitamin B-12: 525 pg/mL (ref 180–914)

## 2024-01-28 MED ORDER — IRON SUCROSE 200 MG IVPB - SIMPLE MED
200.0000 mg | Freq: Once | Status: AC
  Administered 2024-01-28: 200 mg via INTRAVENOUS
  Filled 2024-01-28: qty 200

## 2024-01-28 MED ORDER — PANTOPRAZOLE SODIUM 40 MG PO TBEC
40.0000 mg | DELAYED_RELEASE_TABLET | Freq: Every day | ORAL | Status: DC
  Administered 2024-01-30: 40 mg via ORAL
  Filled 2024-01-28 (×2): qty 1

## 2024-01-28 NOTE — ED Notes (Signed)
 [address] called prior to patient being transported to floor

## 2024-01-28 NOTE — Consult Note (Signed)
 [facility] Gastroenterology Consult  Referring Provider: No ref. provider found Primary Care Physician:  Rexanne Ingle, MD Primary Gastroenterologist: [facility] Burn[facility]  Reason for Consultation: Bright red blood per rectum  SUBJECTIVE:   HPI: [name] is a 89 y.o. female with past medical history significant for coronary artery disease, chronic kidney disease, pulmonary hypertension.  Presented to hospital with chief complaint of blood per rectum.  She resides with her niece, also present at bedside, notes that patient has experienced blood per rectum chronically.  Has had hemorrhoid ligation in the past, was informed that if she had recurrence/worsening of bleeding she should present to the hospital.  Patient noted that she has some anterior chest wall pain.  No shortness of breath.  No abdominal pain.  No nausea or vomiting.  Hemoglobin 9.4, platelet 168, WBC 11.2, sodium 145, potassium 3.4.  CT scan of abdomen pelvis 01/27/2024 showed partially imaged large hiatal hernia, extensive colonic diverticulosis, no diverticulitis.  Colonoscopy 08/13/2013 for hematochezia (Dr. Burnette) showed grade 3 internal hemorrhoids with bleeding.  Nursing noted that patient recently had bowel movement with no blood.  Past Medical History:  Diagnosis Date   Allergic rhinitis    CAD (coronary artery disease)    CHF (congestive heart failure) (HCC)    CKD (chronic kidney disease) stage 3, GFR 30-59 ml/min (HCC) 09/12/2014   Diverticular disease    Diverticulosis    DJD (degenerative joint disease) of knee    DJD (degenerative joint disease) of lumbar spine    scoliosis   Eczema    Esophageal erosions    from nsaid    GI bleed    Hormone replacement therapy (postmenopausal)    Hyperglycemia    Hyperlipidemia    Hypertension    Pneumonia    july 2009   Pulmonary hypertension (HCC)    a. 2D echo 03/2008 showed normal biventricular thickness, chamber size and systolic function, EF 50-55%,  mild mitral regurgitation, aortic sclerosis, moderate pulmonary HTN, pulmonic regurgitation.   PVC's (premature ventricular contractions)    Reactive depression (situational)    Sciatica    right lower extremity   Shingles    right thoracic 2008   Vertigo    Past Surgical History:  Procedure Laterality Date   APPENDECTOMY     BACK SURGERY     BREAST LUMPECTOMY     FLEXIBLE SIGMOIDOSCOPY Left 08/13/2013   Procedure: FLEXIBLE SIGMOIDOSCOPY;  Surgeon: Elsie Burnette, MD;  Location: [facility] ENDOSCOPY;  Service: Endoscopy;  Laterality: Left;   TOTAL ABDOMINAL HYSTERECTOMY     Prior to Admission medications  Medication Sig Start Date End Date Taking? Authorizing Provider  acetaminophen  (TYLENOL ) 325 MG tablet Take 650 mg by mouth every 6 (six) hours as needed for mild pain (pain score 1-3) or moderate pain (pain score 4-6).   Yes [provider]  amLODipine  (NORVASC ) 2.5 MG tablet Take 2.5 mg by mouth daily. 10/11/21  Yes [provider]  [place] PHENIQUE MAXIMUM ST 10.8-4.7 % LIQD Apply 1 Application topically daily as needed for mild pain (pain score 1-3).   Yes [provider]  ferrous sulfate 325 (65 FE) MG tablet Take 325 mg by mouth daily with breakfast.   Yes [provider]  furosemide  (LASIX ) 20 MG tablet Take 10 mg by mouth See admin instructions. Take 10 mg by mouth in the morning on Mon/Wed/Fri/Sat   Yes [provider]  isosorbide  mononitrate (IMDUR ) 30 MG 24 hr tablet Take 0.5 tablets (15 mg total) by  mouth daily. 11/26/21  Yes Jerri Keys, MD  lidocaine  4 % Place 1 patch onto the skin daily as needed (for pain- right shoulder and upper arm).   Yes [provider]  loperamide  (IMODIUM ) 2 MG capsule Take 2 mg by mouth as needed for diarrhea or loose stools.   Yes [provider]  meclizine  (ANTIVERT ) 25 MG tablet Take 25 mg by mouth 2 (two) times daily as needed for dizziness. 09/28/21  Yes [provider]  metoprolol   succinate (TOPROL -XL) 25 MG 24 hr tablet Take 0.5 tablets (12.5 mg total) by mouth daily. 11/26/21  Yes Jerri Keys, MD  pantoprazole  (PROTONIX ) 40 MG tablet Take 40 mg by mouth every Monday, Wednesday, and Friday.   Yes [provider]  PARoxetine  (PAXIL ) 10 MG tablet Take 10 mg by mouth daily. 03/10/20  Yes [provider]  PEPTO BISMOL 525 MG/30ML suspension Take 30 mLs by mouth every 6 (six) hours as needed for diarrhea or loose stools.   Yes [provider]  polyethylene glycol (MIRALAX ) packet Take 17 g by mouth daily as needed for mild constipation. 08/21/17  Yes Luke Agent, MD  potassium chloride  (KLOR-CON ) 10 MEQ tablet Take 20 mEq by mouth daily.   Yes [provider]  SYSTANE ULTRA 0.4-0.3 % SOLN Place 1 drop into both eyes in the morning and at bedtime.   Yes [provider]  lidocaine  (LIDODERM ) 5 % Place 1 patch onto the skin daily. Remove & Discard patch within 12 hours or as directed by MD Patient not taking: Reported on 01/27/2024 11/25/21   Jerri Keys, MD  magnesium  oxide (MAG-OX) 400 (240 Mg) MG tablet Take 1 tablet (400 mg total) by mouth daily. Patient not taking: Reported on 01/27/2024 11/24/21   Jerri Keys, MD   Current Facility-Administered Medications  Medication Dose Route Frequency Provider Last Rate Last Admin   acetaminophen  (TYLENOL ) tablet 650 mg  650 mg Oral Q6H PRN Amponsah, Prosper M, MD       Or   acetaminophen  (TYLENOL ) suppository 650 mg  650 mg Rectal Q6H PRN Lou Claretta HERO, MD       amLODipine  (NORVASC ) tablet 2.5 mg  2.5 mg Oral Daily Amponsah, Prosper M, MD   2.5 mg at 01/28/24 1042   artificial tears ophthalmic solution 1 drop  1 drop Both Eyes BID Lou Claretta HERO, MD   1 drop at 01/28/24 1045   bisacodyl (DULCOLAX) EC tablet 5 mg  5 mg Oral Daily PRN Amponsah, Prosper M, MD       ferrous sulfate tablet 325 mg  325 mg Oral Q breakfast Amponsah, Prosper M, MD   325 mg at 01/28/24 0844   iron  sucrose (VENOFER)  200 mg in sodium chloride  0.9 % 100 mL IVPB  200 mg Intravenous Once Mathews, Elizabeth G, MD       isosorbide  mononitrate (IMDUR ) 24 hr tablet 15 mg  15 mg Oral Daily Amponsah, Prosper M, MD   15 mg at 01/28/24 1041   metoprolol  succinate (TOPROL -XL) 24 hr tablet 12.5 mg  12.5 mg Oral Daily Amponsah, Prosper M, MD   12.5 mg at 01/28/24 1043   ondansetron  (ZOFRAN ) tablet 4 mg  4 mg Oral Q6H PRN Amponsah, Prosper M, MD       Or   ondansetron  (ZOFRAN ) injection 4 mg  4 mg Intravenous Q6H PRN Amponsah, Prosper M, MD       [START ON 01/29/2024] pantoprazole  (PROTONIX ) EC tablet 40 mg  40  mg Oral Daily Will Almarie MATSU, MD       PARoxetine  (PAXIL ) tablet 10 mg  10 mg Oral Daily Amponsah, Prosper M, MD   10 mg at 01/28/24 1044   potassium chloride  (KLOR-CON ) CR tablet 20 mEq  20 mEq Oral Daily Amponsah, Prosper M, MD   20 mEq at 01/28/24 1041   senna-docusate (Senokot-S) tablet 1 tablet  1 tablet Oral QHS PRN Lou Claretta HERO, MD       Allergies as of 01/27/2024 - Review Complete 01/27/2024  Allergen Reaction Noted   Ace inhibitors Hives and Itching 12/25/2010   Streptomycin Itching and Other (See Comments) 12/25/2010   Tape Other (See Comments) 01/27/2024   Tramadol Other (See Comments) 12/25/2010   Vesicare [solifenacin succinate] Itching 08/04/2016   Penicillins Itching, Rash, and Other (See Comments) 12/25/2010   Family History  Problem Relation Age of Onset   Heart disease Father    Heart attack Father    Heart disease Mother    Heart attack Mother    Heart disease Daughter    Heart disease Son    Cancer Brother    Heart disease Sister    Heart attack Sister        X2   Heart attack Brother    Hypertension Brother    Stroke Neg Hx    Social History   Socioeconomic History   Marital status: Widowed    Spouse name: Not on file   Number of children: 0   Years of education: Not on file   Highest education level: Not on file  Occupational History   Occupation: retired  engineer, site  Tobacco Use   Smoking status: Never   Smokeless tobacco: Never  Vaping Use   Vaping status: Never Used  Substance and Sexual Activity   Alcohol  use: Yes    Comment: occasional cocktail   Drug use: No   Sexual activity: Never  Other Topics Concern   Not on file  Social History Narrative   Pt is widowed and lives alone.         Social Drivers of Health   Tobacco Use: Low Risk (01/27/2024)   Patient History    Smoking Tobacco Use: Never    Smokeless Tobacco Use: Never    Passive Exposure: Not on file  Financial Resource Strain: Not on file  Food Insecurity: No Food Insecurity (01/28/2024)   Epic    Worried About Programme Researcher, Broadcasting/film/video in the Last Year: Never true    Ran Out of Food in the Last Year: Never true  Transportation Needs: Not on file  Physical Activity: Not on file  Stress: Not on file  Social Connections: Not on file  Intimate Partner Violence: Not At Risk (01/28/2024)   Epic    Fear of Current or Ex-Partner: No    Emotionally Abused: No    Physically Abused: No    Sexually Abused: No  Depression (PHQ2-9): Not on file  Alcohol  Screen: Not on file  Housing: Low Risk (01/28/2024)   Epic    Unable to Pay for Housing in the Last Year: No    Number of Times Moved in the Last Year: 0    Homeless in the Last Year: No  Utilities: Not on file  Health Literacy: Not on file   Review of Systems:  Review of Systems  Respiratory:  Negative for shortness of breath.   Cardiovascular:  Positive for chest pain.  Gastrointestinal:  Positive for blood in stool.  Negative for abdominal pain, nausea and vomiting.    OBJECTIVE:   Temp:  [97.6 F (36.4 C)-98.1 F (36.7 C)] 97.7 F (36.5 C) (12/31 1308) Pulse Rate:  [71-79] 79 (12/31 1308) Resp:  [16-21] 20 (12/31 1308) BP: (109-152)/(58-89) 131/63 (12/31 1308) SpO2:  [93 %-100 %] 98 % (12/31 1308) Last BM Date : 01/28/24 (bloody stool) Physical Exam Constitutional:      General: She is not in acute  distress.    Appearance: She is not ill-appearing, toxic-appearing or diaphoretic.  Cardiovascular:     Rate and Rhythm: Normal rate and regular rhythm.  Pulmonary:     Breath sounds: Normal breath sounds.  Abdominal:     General: Bowel sounds are normal. There is no distension.     Palpations: Abdomen is soft.     Tenderness: There is no abdominal tenderness. There is no guarding.  Genitourinary:    Rectum: External hemorrhoid and internal hemorrhoid present. No tenderness.     Comments: Nurse Amy present at bedside as chaperone for rectal exam.  Weak anal tone, no anal fissure noted, small external hemorrhoids nonthrombosed, small/medium internal hemorrhoids to palpation, no gross bleeding. Skin:    General: Skin is warm and dry.     Coloration: Skin is pale.  Neurological:     Mental Status: She is alert.     Labs: Recent Labs    01/27/24 1817 01/28/24 0748  WBC 11.2* 9.0  HGB 9.4* 9.1*  HCT 30.5* 28.9*  PLT 168 152   BMET Recent Labs    01/27/24 1817 01/28/24 0748  NA 145 143  K 3.4* 3.5  CL 109 111  CO2 27 18*  GLUCOSE 125* 79  BUN 13 16  CREATININE 0.85 0.75  CALCIUM 8.8* 8.7*   LFT Recent Labs    01/27/24 1817  PROT 7.2  ALBUMIN 3.6  AST 16  ALT <5  ALKPHOS 66  BILITOT 0.4   PT/INR No results for input(s): LABPROT, INR in the last 72 hours.  Diagnostic imaging: CT ABDOMEN PELVIS WO CONTRAST Result Date: 01/27/2024 CLINICAL DATA:  Bright red blood per rectum EXAM: CT ABDOMEN AND PELVIS WITHOUT CONTRAST TECHNIQUE: Multidetector CT imaging of the abdomen and pelvis was performed following the standard protocol without IV contrast. RADIATION DOSE REDUCTION: This exam was performed according to the departmental dose-optimization program which includes automated exposure control, adjustment of the mA and/or kV according to patient size and/or use of iterative reconstruction technique. COMPARISON:  CT abdomen and pelvis dated 08/30/2023 FINDINGS:  Lower chest: Bilateral lower lobe atelectasis. No pleural effusion or pneumothorax demonstrated. Partially imaged heart size is normal. Hepatobiliary: No focal hepatic lesions. No intra or extrahepatic biliary ductal dilation. Normal gallbladder. Pancreas: No focal lesions or main ductal dilation. Spleen: Normal in size without focal abnormality. Adrenals/Urinary Tract: No adrenal nodules. No suspicious renal mass, calculi or hydronephrosis. No focal bladder wall thickening. Stomach/Bowel: Partially imaged large hiatal hernia. No evidence of bowel wall thickening, distention, or inflammatory changes. Extensive colonic diverticulosis without acute diverticulitis. Appendix is not discretely seen. Vascular/Lymphatic: Aortic atherosclerosis. No enlarged abdominal or pelvic lymph nodes. Reproductive: No adnexal masses. Other: No free fluid, fluid collection, or free air. Musculoskeletal: No acute or abnormal lytic or blastic osseous lesions. Multilevel degenerative changes of the partially imaged thoracic and lumbar spine. Unchanged ankylosis of T12-L2. IMPRESSION: 1. Extensive colonic diverticulosis without acute diverticulitis. 2. Partially imaged large hiatal hernia. 3.  Aortic Atherosclerosis (ICD10-I70.0). Electronically Signed   By: Limin  Xu M.D.  On: 01/27/2024 17:43   IMPRESSION: Blood per rectum Diverticulosis Normocytic anemia, acute on chronic Coronary artery disease Hypertension  PLAN: -Rectal exam consistent with internal and external hemorrhoids contributing to bright red blood per rectum, in setting of CT imaging showing diverticulosis, there may also be component of diverticular bleeding, though given patient's age and medical comorbidities, would not advise bowel preparation or endoscopic evaluation of bleeding, would recommend supportive care, this was discussed with patient and her niece at bedside, they were in agreement -Consider Anusol  suppository -Diet as tolerated -[facility] will  sign off and be available as needed   LOS: 0 days   Estefana Keas, [facility] Gastroenterology

## 2024-01-28 NOTE — Progress Notes (Signed)
 " PROGRESS NOTE    [name]  FMW:[id number] DOB: 17-Mar-1916 DOA: 01/27/2024 PCP: Rexanne Ingle, MD    Brief Narrative:  89 y.o. female with medical history significant for HTN, CAD, HFrEF, pulmonary hypertension, HLD, DJD, CKD stage IIIA, vertigo, GERD and IDA who presented to the ED for evaluation of rectal bleeding. Patient reports that recently, she has felt increased fatigue. Today, she noticed bleeding from her rectum with associated large blood clots. She also reports some dizziness and generalized weakness but denies any rectal pain, abdominal pain, nausea, vomiting, syncope, shortness of breath, hematuria or hematemesis.   ED Course: Initial vitals show patient afebrile and normotensive. Initial labs significant for WBC 11.2, Hgb 9.4, K+ 3.4, positive FOBT and normal renal function. CT A/P shows a large hiatal hernia but no acute intra-abdominal abnormality. GI (Dr. Kriss) was consulted for evaluation. [facility] was consulted for admission.   Assessment & Plan:   Principal Problem:   GI bleed Active Problems:   Essential hypertension   HFrEF (heart failure with reduced ejection fraction) (HCC)   Mood disorder   Gastroesophageal reflux disease  # Rectal bleeding?  Diverticular bleed 2 shows diverticulosis with no evidence of diverticulitis or mass. # GI bleed Hemoglobin 11.4 in August 2025 admitted with 9.4 this admission down to 9.1 she reports she continues to have bowel movements with maroon-colored blood  Admitted with rectal bleeding with blood clots. Patient remains hemodynamically stable FOBT positive was started on clear liquids by GI. Anemia panel likely a combination of iron  deficiency and anemia of chronic disease Tx unit of Venofer   # Hypokalemia resolved continue to monitor - Continue home potassium supplementation   # HTN - BP stable with SBP in the 110-130s - Continue amlodipine , Imdur  and Toprol -XL   # HFrEF # Pulmonary hypertension - Patient  euvolemic on exam - Continue Toprol  XL holding Lasix    # CKD 3A - Creatinine remains within normal limits at 0.85 with normal BUN - Trend renal function, avoid nephrotoxic agents   # GERD - Continue Protonix    # Mood disorder - Continue Paxil     Estimated body mass index is 17.27 kg/m as calculated from the following:   Height as of 08/30/23: 5' 6 (1.676 m).   Weight as of 08/30/23: 48.5 kg.  DVT prophylaxis: scd Code Status:dnr Family Communication: dw neice Disposition Plan:  Status is: Observation    Consultants: gi  Procedures:none Antimicrobials:none  Subjective: Discussed with patient's niece no further bowel movements or GI bleed since this morning.  Niece reports patient's daughter died at the age of 64 from some kind of anemia.  Objective: Vitals:   01/28/24 0125 01/28/24 0511 01/28/24 0939 01/28/24 1308  BP: 136/71 119/65 (!) 152/75 131/63  Pulse: 74 79 72 79  Resp: 18 16 20 20   Temp: 97.8 F (36.6 C) 98.1 F (36.7 C) 97.7 F (36.5 C) 97.7 F (36.5 C)  TempSrc:  Oral    SpO2: 100% 97% 93% 98%    Intake/Output Summary (Last 24 hours) at 01/28/2024 1429 Last data filed at 01/27/2024 2029 Gross per 24 hour  Intake 600 ml  Output --  Net 600 ml   There were no vitals filed for this visit.  Examination:  General exam: Appears in no acute distress Respiratory system: Clear to auscultation. Respiratory effort normal. Cardiovascular system: Rate regular gastrointestinal system: Abdomen is nondistended, soft and nontender. No organomegaly or masses felt. Normal bowel sounds heard. Central nervous system: Alert and oriented.  Extremities: No edema  Data Reviewed: I have personally reviewed following labs and imaging studies  CBC: Recent Labs  Lab 01/27/24 1817 01/28/24 0748  WBC 11.2* 9.0  NEUTROABS 8.9*  --   HGB 9.4* 9.1*  HCT 30.5* 28.9*  MCV 93.6 93.5  PLT 168 152   Basic Metabolic Panel: Recent Labs  Lab 01/27/24 1817 01/28/24 0748   NA 145 143  K 3.4* 3.5  CL 109 111  CO2 27 18*  GLUCOSE 125* 79  BUN 13 16  CREATININE 0.85 0.75  CALCIUM 8.8* 8.7*  MG  --  2.2   GFR: CrCl cannot be calculated (Unknown ideal weight.). Liver Function Tests: Recent Labs  Lab 01/27/24 1817  AST 16  ALT <5  ALKPHOS 66  BILITOT 0.4  PROT 7.2  ALBUMIN 3.6   No results for input(s): LIPASE, AMYLASE in the last 168 hours. No results for input(s): AMMONIA in the last 168 hours. Coagulation Profile: No results for input(s): INR, PROTIME in the last 168 hours. Cardiac Enzymes: No results for input(s): CKTOTAL, CKMB, CKMBINDEX, TROPONINI in the last 168 hours. BNP (last 3 results) No results for input(s): PROBNP in the last 8760 hours. HbA1C: No results for input(s): HGBA1C in the last 72 hours. CBG: No results for input(s): GLUCAP in the last 168 hours. Lipid Profile: No results for input(s): CHOL, HDL, LDLCALC, TRIG, CHOLHDL, LDLDIRECT in the last 72 hours. Thyroid  Function Tests: No results for input(s): TSH, T4TOTAL, FREET4, T3FREE, THYROIDAB in the last 72 hours. Anemia Panel: Recent Labs    01/28/24 0748  VITAMINB12 525  FERRITIN 15  TIBC 301  IRON  27*   Sepsis Labs: No results for input(s): PROCALCITON, LATICACIDVEN in the last 168 hours.  No results found for this or any previous visit (from the past 240 hours).       Radiology Studies: CT ABDOMEN PELVIS WO CONTRAST Result Date: 01/27/2024 CLINICAL DATA:  Bright red blood per rectum EXAM: CT ABDOMEN AND PELVIS WITHOUT CONTRAST TECHNIQUE: Multidetector CT imaging of the abdomen and pelvis was performed following the standard protocol without IV contrast. RADIATION DOSE REDUCTION: This exam was performed according to the departmental dose-optimization program which includes automated exposure control, adjustment of the mA and/or kV according to patient size and/or use of iterative reconstruction technique.  COMPARISON:  CT abdomen and pelvis dated 08/30/2023 FINDINGS: Lower chest: Bilateral lower lobe atelectasis. No pleural effusion or pneumothorax demonstrated. Partially imaged heart size is normal. Hepatobiliary: No focal hepatic lesions. No intra or extrahepatic biliary ductal dilation. Normal gallbladder. Pancreas: No focal lesions or main ductal dilation. Spleen: Normal in size without focal abnormality. Adrenals/Urinary Tract: No adrenal nodules. No suspicious renal mass, calculi or hydronephrosis. No focal bladder wall thickening. Stomach/Bowel: Partially imaged large hiatal hernia. No evidence of bowel wall thickening, distention, or inflammatory changes. Extensive colonic diverticulosis without acute diverticulitis. Appendix is not discretely seen. Vascular/Lymphatic: Aortic atherosclerosis. No enlarged abdominal or pelvic lymph nodes. Reproductive: No adnexal masses. Other: No free fluid, fluid collection, or free air. Musculoskeletal: No acute or abnormal lytic or blastic osseous lesions. Multilevel degenerative changes of the partially imaged thoracic and lumbar spine. Unchanged ankylosis of T12-L2. IMPRESSION: 1. Extensive colonic diverticulosis without acute diverticulitis. 2. Partially imaged large hiatal hernia. 3.  Aortic Atherosclerosis (ICD10-I70.0). Electronically Signed   By: Limin  Xu M.D.   On: 01/27/2024 17:43   Scheduled Meds:  amLODipine   2.5 mg Oral Daily   artificial tears  1 drop Both Eyes BID  ferrous sulfate  325 mg Oral Q breakfast   isosorbide  mononitrate  15 mg Oral Daily   metoprolol  succinate  12.5 mg Oral Daily   [START ON 01/29/2024] pantoprazole   40 mg Oral Daily   PARoxetine   10 mg Oral Daily   potassium chloride   20 mEq Oral Daily   Continuous Infusions:   LOS: 0 days    Morgan KANDICE Hoots, MD 01/28/2024, 2:29 PM   "

## 2024-01-28 NOTE — Care Management Obs Status (Signed)
 MEDICARE OBSERVATION STATUS NOTIFICATION   Patient Details  Name: [name] MRN: [id number] Date of Birth: Jan 07, 1917   Medicare Observation Status Notification Given:  Yes    Toy LITTIE Agar, RN 01/28/2024, 4:14 PM

## 2024-01-29 DIAGNOSIS — I5020 Unspecified systolic (congestive) heart failure: Secondary | ICD-10-CM | POA: Diagnosis not present

## 2024-01-29 DIAGNOSIS — E876 Hypokalemia: Secondary | ICD-10-CM | POA: Diagnosis not present

## 2024-01-29 DIAGNOSIS — K625 Hemorrhage of anus and rectum: Secondary | ICD-10-CM

## 2024-01-29 DIAGNOSIS — K219 Gastro-esophageal reflux disease without esophagitis: Secondary | ICD-10-CM | POA: Diagnosis not present

## 2024-01-29 DIAGNOSIS — K922 Gastrointestinal hemorrhage, unspecified: Secondary | ICD-10-CM | POA: Diagnosis present

## 2024-01-29 DIAGNOSIS — F39 Unspecified mood [affective] disorder: Secondary | ICD-10-CM | POA: Diagnosis not present

## 2024-01-29 DIAGNOSIS — I2510 Atherosclerotic heart disease of native coronary artery without angina pectoris: Secondary | ICD-10-CM | POA: Diagnosis not present

## 2024-01-29 DIAGNOSIS — K449 Diaphragmatic hernia without obstruction or gangrene: Secondary | ICD-10-CM | POA: Diagnosis not present

## 2024-01-29 DIAGNOSIS — N1831 Chronic kidney disease, stage 3a: Secondary | ICD-10-CM | POA: Diagnosis not present

## 2024-01-29 DIAGNOSIS — R42 Dizziness and giddiness: Secondary | ICD-10-CM | POA: Diagnosis not present

## 2024-01-29 DIAGNOSIS — I272 Pulmonary hypertension, unspecified: Secondary | ICD-10-CM | POA: Diagnosis not present

## 2024-01-29 DIAGNOSIS — I130 Hypertensive heart and chronic kidney disease with heart failure and stage 1 through stage 4 chronic kidney disease, or unspecified chronic kidney disease: Secondary | ICD-10-CM | POA: Diagnosis not present

## 2024-01-29 DIAGNOSIS — Z79899 Other long term (current) drug therapy: Secondary | ICD-10-CM | POA: Diagnosis not present

## 2024-01-29 LAB — CBC
HCT: 24 % — ABNORMAL LOW (ref 36.0–46.0)
Hemoglobin: 7.8 g/dL — ABNORMAL LOW (ref 12.0–15.0)
MCH: 29.7 pg (ref 26.0–34.0)
MCHC: 32.5 g/dL (ref 30.0–36.0)
MCV: 91.3 fL (ref 80.0–100.0)
Platelets: 145 K/uL — ABNORMAL LOW (ref 150–400)
RBC: 2.63 MIL/uL — ABNORMAL LOW (ref 3.87–5.11)
RDW: 16 % — ABNORMAL HIGH (ref 11.5–15.5)
WBC: 5.8 K/uL (ref 4.0–10.5)
nRBC: 0 % (ref 0.0–0.2)

## 2024-01-29 LAB — COMPREHENSIVE METABOLIC PANEL WITH GFR
ALT: <5 U/L (ref 0–44)
AST: 15 U/L (ref 15–41)
Albumin: 3.3 g/dL — ABNORMAL LOW (ref 3.5–5.0)
Alkaline Phosphatase: 61 U/L (ref 38–126)
Anion gap: 10 (ref 5–15)
BUN: 8 mg/dL (ref 8–23)
CO2: 24 mmol/L (ref 22–32)
Calcium: 8.8 mg/dL — ABNORMAL LOW (ref 8.9–10.3)
Chloride: 107 mmol/L (ref 98–111)
Creatinine, Ser: 0.7 mg/dL (ref 0.44–1.00)
GFR, Estimated: >60 mL/min
Glucose, Bld: 81 mg/dL (ref 70–99)
Potassium: 3.2 mmol/L — ABNORMAL LOW (ref 3.5–5.1)
Sodium: 141 mmol/L (ref 135–145)
Total Bilirubin: 0.4 mg/dL (ref 0.0–1.2)
Total Protein: 6.6 g/dL (ref 6.5–8.1)

## 2024-01-29 MED ORDER — HYDROCORTISONE ACETATE 25 MG RE SUPP
25.0000 mg | Freq: Two times a day (BID) | RECTAL | Status: DC
  Filled 2024-01-29 (×3): qty 1

## 2024-01-29 NOTE — Plan of Care (Signed)
" °  Problem: Safety: Goal: Ability to remain free from injury will improve Outcome: Not Progressing Note: Memory impairment, hard of hearing, and impulsiveness impairs safety awareness. Moved closer to nursing station and bed alarm on.    Problem: Clinical Measurements: Goal: Ability to maintain clinical measurements within normal limits will improve Outcome: Progressing Goal: Will remain free from infection Outcome: Progressing   Problem: Activity: Goal: Risk for activity intolerance will decrease Outcome: Progressing   Problem: Coping: Goal: Level of anxiety will decrease Outcome: Progressing   Problem: Elimination: Goal: Will not experience complications related to bowel motility Outcome: Progressing Goal: Will not experience complications related to urinary retention Outcome: Progressing   Problem: Pain Managment: Goal: General experience of comfort will improve and/or be controlled Outcome: Progressing   Problem: Skin Integrity: Goal: Risk for impaired skin integrity will decrease Outcome: Progressing   "

## 2024-01-29 NOTE — Progress Notes (Signed)
 " PROGRESS NOTE    KAMRIN SPATH  FMW:[id number] DOB: 24-Jul-1916 DOA: 01/27/2024 PCP: Rexanne Ingle, MD    Brief Narrative:  89 y.o. female with medical history significant for HTN, CAD, HFrEF, pulmonary hypertension, HLD, DJD, CKD stage IIIA, vertigo, GERD and IDA who presented to the ED for evaluation of rectal bleeding. Patient reports that recently, she has felt increased fatigue. Today, she noticed bleeding from her rectum with associated large blood clots. She also reports some dizziness and generalized weakness but denies any rectal pain, abdominal pain, nausea, vomiting, syncope, shortness of breath, hematuria or hematemesis.   ED Course: Initial vitals show patient afebrile and normotensive. Initial labs significant for WBC 11.2, Hgb 9.4, K+ 3.4, positive FOBT and normal renal function. CT A/P shows a large hiatal hernia but no acute intra-abdominal abnormality. GI (Dr. Kriss) was consulted for evaluation. [facility] was consulted for admission.   Assessment & Plan:   Principal Problem:   GI bleed Active Problems:   Essential hypertension   HFrEF (heart failure with reduced ejection fraction) (HCC)   Mood disorder   Gastroesophageal reflux disease  # Rectal bleeding?  Diverticular bleed 2 shows diverticulosis with no evidence of diverticulitis or mass. # GI bleed Hemoglobin 11.4 in August 2025  admitted with 9.4 this admission down 7.8 from 9.1 from 9.4.  No bowel movements nausea vomiting reported overnight.  She does have hemorrhoids.  Internal and external.  GI notes reviewed.  This is partly hemodilutional as she was on IV fluids.   Admitted with rectal bleeding with blood clots. Patient remains hemodynamically stable FOBT positive Advance diet Ambulate Out of bed Follow-up H&H in a.m. to make sure she stopped bleeding.  Anemia panel likely a combination of iron  deficiency and anemia of chronic disease Tx unit of Venofer   # Hypokalemia resolved continue to monitor -  Continue home potassium supplementation   # HTN - BP stable with SBP in the 110-130s - Continue amlodipine , Imdur  and Toprol -XL   # HFrEF # Pulmonary hypertension - Patient euvolemic on exam - Continue Toprol  XL holding Lasix    # CKD 3A - Creatinine remains within normal limits at 0.85 with normal BUN - Trend renal function, avoid nephrotoxic agents   # GERD - Continue Protonix    # Mood disorder - Continue Paxil     Estimated body mass index is 17.27 kg/m as calculated from the following:   Height as of 08/30/23: 5' 6 (1.676 m).   Weight as of 08/30/23: 48.5 kg.  DVT prophylaxis: scd Code Status:dnr Family Communication: dw neice Disposition Plan:  Status is: Observation    Consultants: gi  Procedures:none Antimicrobials:none  Subjective:  Very sleepy.SABRA Opens eyes looks at us  but does not want to talk to us  moves all extremities walked to the restroom this morning.  I did call her niece and niece is bringing her some homemade food. Objective: Vitals:   01/28/24 0511 01/28/24 0939 01/28/24 1308 01/29/24 0623  BP: 119/65 (!) 152/75 131/63 138/69  Pulse: 79 72 79 81  Resp: 16 20 20 16   Temp: 98.1 F (36.7 C) 97.7 F (36.5 C) 97.7 F (36.5 C) 98.2 F (36.8 C)  TempSrc: Oral     SpO2: 97% 93% 98% 98%    Intake/Output Summary (Last 24 hours) at 01/29/2024 1251 Last data filed at 01/28/2024 1841 Gross per 24 hour  Intake 180 ml  Output --  Net 180 ml   There were no vitals filed for this visit.  Examination:  General exam: Appears in no acute distress Respiratory system: Clear to auscultation. Respiratory effort normal. Cardiovascular system: Rate regular gastrointestinal system: Abdomen is nondistended, soft and nontender. No organomegaly or masses felt. Normal bowel sounds heard. Central nervous system: Alert and oriented.  Extremities: No edema  Data Reviewed: I have personally reviewed following labs and imaging studies  CBC: Recent Labs  Lab  01/27/24 1817 01/28/24 0748 01/29/24 0623  WBC 11.2* 9.0 5.8  NEUTROABS 8.9*  --   --   HGB 9.4* 9.1* 7.8*  HCT 30.5* 28.9* 24.0*  MCV 93.6 93.5 91.3  PLT 168 152 145*   Basic Metabolic Panel: Recent Labs  Lab 01/27/24 1817 01/28/24 0748 01/29/24 0623  NA 145 143 141  K 3.4* 3.5 3.2*  CL 109 111 107  CO2 27 18* 24  GLUCOSE 125* 79 81  BUN 13 16 8   CREATININE 0.85 0.75 0.70  CALCIUM 8.8* 8.7* 8.8*  MG  --  2.2  --    GFR: CrCl cannot be calculated (Unknown ideal weight.). Liver Function Tests: Recent Labs  Lab 01/27/24 1817 01/29/24 0623  AST 16 15  ALT <5 <5  ALKPHOS 66 61  BILITOT 0.4 0.4  PROT 7.2 6.6  ALBUMIN 3.6 3.3*   No results for input(s): LIPASE, AMYLASE in the last 168 hours. No results for input(s): AMMONIA in the last 168 hours. Coagulation Profile: No results for input(s): INR, PROTIME in the last 168 hours. Cardiac Enzymes: No results for input(s): CKTOTAL, CKMB, CKMBINDEX, TROPONINI in the last 168 hours. BNP (last 3 results) No results for input(s): PROBNP in the last 8760 hours. HbA1C: No results for input(s): HGBA1C in the last 72 hours. CBG: No results for input(s): GLUCAP in the last 168 hours. Lipid Profile: No results for input(s): CHOL, HDL, LDLCALC, TRIG, CHOLHDL, LDLDIRECT in the last 72 hours. Thyroid  Function Tests: No results for input(s): TSH, T4TOTAL, FREET4, T3FREE, THYROIDAB in the last 72 hours. Anemia Panel: Recent Labs    01/28/24 0748  VITAMINB12 525  FERRITIN 15  TIBC 301  IRON  27*   Sepsis Labs: No results for input(s): PROCALCITON, LATICACIDVEN in the last 168 hours.  Recent Results (from the past 240 hours)  Gastrointestinal Panel by PCR , Stool     Status: None   Collection Time: 01/27/24  5:00 PM   Specimen: Stool  Result Value Ref Range Status   Campylobacter species NOT DETECTED NOT DETECTED Final   Plesimonas shigelloides NOT DETECTED NOT DETECTED  Final   Salmonella species NOT DETECTED NOT DETECTED Final   Yersinia enterocolitica NOT DETECTED NOT DETECTED Final   Vibrio species NOT DETECTED NOT DETECTED Final   Vibrio cholerae NOT DETECTED NOT DETECTED Final   Enteroaggregative E coli (EAEC) NOT DETECTED NOT DETECTED Final   Enteropathogenic E coli (EPEC) NOT DETECTED NOT DETECTED Final   Enterotoxigenic E coli (ETEC) NOT DETECTED NOT DETECTED Final   Shiga like toxin producing E coli (STEC) NOT DETECTED NOT DETECTED Final   Shigella/Enteroinvasive E coli (EIEC) NOT DETECTED NOT DETECTED Final   Cryptosporidium NOT DETECTED NOT DETECTED Final   Cyclospora cayetanensis NOT DETECTED NOT DETECTED Final   Entamoeba histolytica NOT DETECTED NOT DETECTED Final   Giardia lamblia NOT DETECTED NOT DETECTED Final   Adenovirus F40/41 NOT DETECTED NOT DETECTED Final   Astrovirus NOT DETECTED NOT DETECTED Final   Norovirus GI/GII NOT DETECTED NOT DETECTED Final   Rotavirus A NOT DETECTED NOT DETECTED Final   Sapovirus (I, II, IV, and  V) NOT DETECTED NOT DETECTED Final    Comment: Performed at [facility], [address] [place], [place] [address]         Radiology Studies: CT ABDOMEN PELVIS WO CONTRAST Result Date: 01/27/2024 CLINICAL DATA:  Bright red blood per rectum EXAM: CT ABDOMEN AND PELVIS WITHOUT CONTRAST TECHNIQUE: Multidetector CT imaging of the abdomen and pelvis was performed following the standard protocol without IV contrast. RADIATION DOSE REDUCTION: This exam was performed according to the departmental dose-optimization program which includes automated exposure control, adjustment of the mA and/or kV according to patient size and/or use of iterative reconstruction technique. COMPARISON:  CT abdomen and pelvis dated 08/30/2023 FINDINGS: Lower chest: Bilateral lower lobe atelectasis. No pleural effusion or pneumothorax demonstrated. Partially imaged heart size is normal. Hepatobiliary: No focal hepatic lesions. No  intra or extrahepatic biliary ductal dilation. Normal gallbladder. Pancreas: No focal lesions or main ductal dilation. Spleen: Normal in size without focal abnormality. Adrenals/Urinary Tract: No adrenal nodules. No suspicious renal mass, calculi or hydronephrosis. No focal bladder wall thickening. Stomach/Bowel: Partially imaged large hiatal hernia. No evidence of bowel wall thickening, distention, or inflammatory changes. Extensive colonic diverticulosis without acute diverticulitis. Appendix is not discretely seen. Vascular/Lymphatic: Aortic atherosclerosis. No enlarged abdominal or pelvic lymph nodes. Reproductive: No adnexal masses. Other: No free fluid, fluid collection, or free air. Musculoskeletal: No acute or abnormal lytic or blastic osseous lesions. Multilevel degenerative changes of the partially imaged thoracic and lumbar spine. Unchanged ankylosis of T12-L2. IMPRESSION: 1. Extensive colonic diverticulosis without acute diverticulitis. 2. Partially imaged large hiatal hernia. 3.  Aortic Atherosclerosis (ICD10-I70.0). Electronically Signed   By: Limin  Xu M.D.   On: 01/27/2024 17:43   Scheduled Meds:  amLODipine   2.5 mg Oral Daily   artificial tears  1 drop Both Eyes BID   ferrous sulfate  325 mg Oral Q breakfast   hydrocortisone   25 mg Rectal BID   isosorbide  mononitrate  15 mg Oral Daily   metoprolol  succinate  12.5 mg Oral Daily   pantoprazole   40 mg Oral Daily   PARoxetine   10 mg Oral Daily   potassium chloride   20 mEq Oral Daily   Continuous Infusions:   LOS: 0 days    Almarie KANDICE Hoots, MD 01/29/2024, 12:51 PM   "

## 2024-01-29 NOTE — Evaluation (Signed)
 Physical Therapy Evaluation Patient Details Name: [name] MRN: [id number] DOB: 1917/01/27 Today's Date: 01/29/2024  History of Present Illness  89 y.o. female who presented to the ED for evaluation of rectal bleeding, admitted with GIB. PMH: HTN, CAD, HFrEF, pulmonary hypertension, HLD, DJD, CKD stage IIIA, vertigo, GERD and IDA , T12 fx 2023  Clinical Impression  Pt admitted with above diagnosis.  Pt initially not agreeable to OOB/mobility. Pt stated she needed BSC, pt min assist overall with bed mobility and STS transfers, CGA for stand step pivot. Pt typically is able to amb household distances with RW, mod I.  No f/u recommended, pt's niece reports that [employer] typically does not work well for pt as she is often  not agreeable to do anything at the times they come and then they try to force her and that does not go well. Pt's niece is able to assist as needed.  Will follow in acute setting.  Pt currently with functional limitations due to the deficits listed below (see PT Problem List). Pt will benefit from acute skilled PT to increase their independence and safety with mobility to allow discharge.           If plan is discharge home, recommend the following: A little help with walking and/or transfers;A little help with bathing/dressing/bathroom;Help with stairs or ramp for entrance;Assistance with cooking/housework;Assist for transportation   Can travel by private vehicle        Equipment Recommendations None recommended by PT  Recommendations for Other Services       Functional Status Assessment Patient has had a recent decline in their functional status and demonstrates the ability to make significant improvements in function in a reasonable and predictable amount of time.     Precautions / Restrictions Precautions Precautions: Fall Restrictions Weight Bearing Restrictions Per Provider Order: No      Mobility  Bed Mobility Overal bed mobility: Needs  Assistance Bed Mobility: Supine to Sit, Sit to Supine     Supine to sit: Contact guard, HOB elevated Sit to supine: Min assist   General bed mobility comments: incr time. CGA for safety. min assist to elevate LEs fully on to bed    Transfers Overall transfer level: Needs assistance Equipment used: None Transfers: Sit to/from Stand, Bed to chair/wheelchair/BSC Sit to Stand: Min assist   Step pivot transfers: Min assist, Contact guard assist       General transfer comment: STS x2 from EOB and BSC. assist to rise and steady, oncer in standign able to perform step pivot with CGA for safety    Ambulation/Gait               General Gait Details: pt declined  Stairs            Wheelchair Mobility     Tilt Bed    Modified Rankin (Stroke Patients Only)       Balance Overall balance assessment: Needs assistance Sitting-balance support: Feet supported, No upper extremity supported Sitting balance-Leahy Scale: Fair     Standing balance support: No upper extremity supported Standing balance-Leahy Scale: Fair Standing balance comment: CGA for safety                             Pertinent Vitals/Pain Pain Assessment Pain Assessment: No/denies pain    Home Living Family/patient expects to be discharged to:: Private residence Living Arrangements:  (niece) Available Help at Discharge: Family   Home Access:  Level entry       Home Layout: One level Home Equipment: [employer] (2 wheels);Rollator (4 wheels);Shower seat      Prior Function Prior Level of Function : Needs assist             Mobility Comments: uses RW or rollator-typically amb in house mod I'ly. niece assists as needed ADLs Comments: niece assists as needed with ADLs     Extremity/Trunk Assessment   Upper Extremity Assessment Upper Extremity Assessment: Defer to OT evaluation    Lower Extremity Assessment Lower Extremity Assessment: Generalized weakness        Communication   Communication Communication: Impaired Factors Affecting Communication: Hearing impaired    Cognition Arousal: Alert Behavior During Therapy: WFL for tasks assessed/performed   PT - Cognitive impairments: Difficult to assess Difficult to assess due to: Hard of hearing/deaf                     PT - Cognition Comments: pt agitated earlier per pt niece, currently sleepy but arouses to voice. calm during PT session. discussing situation with niece EOS, pt aware that she is in the hospital Following commands: Impaired Following commands impaired: Follows one step commands with increased time     Cueing Cueing Techniques: Verbal cues, Tactile cues, Gestural cues     General Comments      Exercises     Assessment/Plan    PT Assessment Patient needs continued PT services  PT Problem List Decreased strength;Decreased activity tolerance;Decreased mobility       PT Treatment Interventions DME instruction;Gait training;Functional mobility training;Therapeutic activities;Patient/family education    PT Goals (Current goals can be found in the Care Plan section)  Acute Rehab PT Goals Patient Stated Goal: to get back home, feel better PT Goal Formulation: With family Time For Goal Achievement: 02/05/24 Potential to Achieve Goals: Good    Frequency Min 2X/week     Co-evaluation               AM-PAC PT 6 Clicks Mobility  Outcome Measure Help needed turning from your back to your side while in a flat bed without using bedrails?: A Little Help needed moving from lying on your back to sitting on the side of a flat bed without using bedrails?: A Little Help needed moving to and from a bed to a chair (including a wheelchair)?: A Little Help needed standing up from a chair using your arms (e.g., wheelchair or bedside chair)?: A Little Help needed to walk in hospital room?: A Little Help needed climbing 3-5 steps with a railing? : A Lot 6 Click Score:  17    End of Session   Activity Tolerance: Patient limited by fatigue Patient left: with call bell/phone within reach;in bed;with family/visitor present;with bed alarm set Nurse Communication: Mobility status PT Visit Diagnosis: Other abnormalities of gait and mobility (R26.89)    Time: 8541-8487 PT Time Calculation (min) (ACUTE ONLY): 14 min   Charges:   PT Evaluation $PT Eval Low Complexity: 1 Low   PT General Charges $$ ACUTE PT VISIT: 1 Visit         Glenice Ciccone, PT  Acute Rehab Dept [facility]) [phone]  01/29/2024   [facility] 01/29/2024, 3:29 PM

## 2024-01-30 ENCOUNTER — Telehealth (HOSPITAL_COMMUNITY): Payer: Self-pay | Admitting: Internal Medicine

## 2024-01-30 DIAGNOSIS — K922 Gastrointestinal hemorrhage, unspecified: Secondary | ICD-10-CM | POA: Diagnosis not present

## 2024-01-30 DIAGNOSIS — N183 Chronic kidney disease, stage 3 unspecified: Secondary | ICD-10-CM | POA: Insufficient documentation

## 2024-01-30 DIAGNOSIS — K625 Hemorrhage of anus and rectum: Secondary | ICD-10-CM | POA: Diagnosis not present

## 2024-01-30 LAB — CBC
HCT: 27.7 % — ABNORMAL LOW (ref 36.0–46.0)
Hemoglobin: 8.5 g/dL — ABNORMAL LOW (ref 12.0–15.0)
MCH: 28.1 pg (ref 26.0–34.0)
MCHC: 30.7 g/dL (ref 30.0–36.0)
MCV: 91.7 fL (ref 80.0–100.0)
Platelets: 162 K/uL (ref 150–400)
RBC: 3.02 MIL/uL — ABNORMAL LOW (ref 3.87–5.11)
RDW: 16 % — ABNORMAL HIGH (ref 11.5–15.5)
WBC: 6.8 K/uL (ref 4.0–10.5)
nRBC: 0 % (ref 0.0–0.2)

## 2024-01-30 LAB — BASIC METABOLIC PANEL WITH GFR
Anion gap: 11 (ref 5–15)
BUN: 7 mg/dL — ABNORMAL LOW (ref 8–23)
CO2: 22 mmol/L (ref 22–32)
Calcium: 8.7 mg/dL — ABNORMAL LOW (ref 8.9–10.3)
Chloride: 112 mmol/L — ABNORMAL HIGH (ref 98–111)
Creatinine, Ser: 0.68 mg/dL (ref 0.44–1.00)
GFR, Estimated: >60 mL/min
Glucose, Bld: 81 mg/dL (ref 70–99)
Potassium: 3.3 mmol/L — ABNORMAL LOW (ref 3.5–5.1)
Sodium: 144 mmol/L (ref 135–145)

## 2024-01-30 MED ORDER — ISOSORBIDE MONONITRATE ER 30 MG PO TB24: 15.0000 mg | ORAL_TABLET | Freq: Every day | ORAL | 2 refills | Status: AC

## 2024-01-30 MED ORDER — POTASSIUM CHLORIDE ER 10 MEQ PO TBCR: 10.0000 meq | EXTENDED_RELEASE_TABLET | Freq: Every day | ORAL | 0 refills | Status: AC

## 2024-01-30 MED ORDER — HYDROCORTISONE ACETATE 25 MG RE SUPP: 25.0000 mg | Freq: Two times a day (BID) | RECTAL | 0 refills | Status: AC

## 2024-01-30 MED ORDER — SENNOSIDES-DOCUSATE SODIUM 8.6-50 MG PO TABS: 1.0000 | ORAL_TABLET | Freq: Every day | ORAL | 5 refills | Status: AC

## 2024-01-30 MED ORDER — BISACODYL 5 MG PO TBEC: 5.0000 mg | DELAYED_RELEASE_TABLET | Freq: Every day | ORAL | 0 refills | Status: AC | PRN

## 2024-01-30 MED ORDER — POLYETHYLENE GLYCOL 3350 17 G PO PACK: 17.0000 g | PACK | Freq: Two times a day (BID) | ORAL | 0 refills | Status: AC

## 2024-01-30 MED ORDER — BISACODYL 5 MG PO TBEC: 5.0000 mg | DELAYED_RELEASE_TABLET | Freq: Every day | ORAL | 0 refills | Status: DC | PRN

## 2024-01-30 NOTE — Plan of Care (Signed)
  Problem: Clinical Measurements: Goal: Will remain free from infection Outcome: Progressing   Problem: Coping: Goal: Level of anxiety will decrease Outcome: Progressing   Problem: Elimination: Goal: Will not experience complications related to urinary retention Outcome: Progressing   Problem: Pain Managment: Goal: General experience of comfort will improve and/or be controlled Outcome: Progressing   Problem: Safety: Goal: Ability to remain free from injury will improve Outcome: Progressing   Problem: Skin Integrity: Goal: Risk for impaired skin integrity will decrease Outcome: Progressing

## 2024-01-30 NOTE — Progress Notes (Signed)
 Pt refused suppository, MD Will notified

## 2024-01-30 NOTE — Progress Notes (Signed)
" °   01/30/24 0830  TOC Brief Assessment  Insurance and Status Reviewed  Patient has primary care physician Yes  Home environment has been reviewed Home with niece  Prior level of function: Independent with assist  Prior/Current Home Services No current home services  Social Drivers of Health Review SDOH reviewed no interventions necessary  Readmission risk has been reviewed Yes  Transition of care needs no transition of care needs at this time    "

## 2024-01-30 NOTE — Telephone Encounter (Signed)
 Patient referred to infusion pharmacy team for ambulatory infusion of IV iron .  Insurance - Medicare  Site of care - Site of care: [facility] Dx code - K62.5/N18.30 IV Iron  Therapy - Feraheme 510 mg IV x 1. Patient received Venofer 200 mg x 1 as inpatient.  Infusion appointments - Scheduling team will schedule patient as soon as possible.   Aarik Blank D. Saumya Hukill, PharmD

## 2024-01-30 NOTE — Plan of Care (Signed)
 Went over discharge instructions with patients caregiver, her niece heron, she verbalized her understanding, Piv intact upon removal.   Problem: Education: Goal: Knowledge of General Education information will improve Description: Including pain rating scale, medication(s)/side effects and non-pharmacologic comfort measures Outcome: Adequate for Discharge   Problem: Health Behavior/Discharge Planning: Goal: Ability to manage health-related needs will improve Outcome: Adequate for Discharge   Problem: Clinical Measurements: Goal: Ability to maintain clinical measurements within normal limits will improve Outcome: Adequate for Discharge Goal: Will remain free from infection Outcome: Adequate for Discharge Goal: Diagnostic test results will improve Outcome: Adequate for Discharge Goal: Respiratory complications will improve Outcome: Adequate for Discharge Goal: Cardiovascular complication will be avoided Outcome: Adequate for Discharge   Problem: Activity: Goal: Risk for activity intolerance will decrease Outcome: Adequate for Discharge   Problem: Nutrition: Goal: Adequate nutrition will be maintained Outcome: Adequate for Discharge   Problem: Coping: Goal: Level of anxiety will decrease Outcome: Adequate for Discharge   Problem: Elimination: Goal: Will not experience complications related to bowel motility Outcome: Adequate for Discharge Goal: Will not experience complications related to urinary retention Outcome: Adequate for Discharge   Problem: Pain Managment: Goal: General experience of comfort will improve and/or be controlled Outcome: Adequate for Discharge   Problem: Safety: Goal: Ability to remain free from injury will improve Outcome: Adequate for Discharge   Problem: Skin Integrity: Goal: Risk for impaired skin integrity will decrease Outcome: Adequate for Discharge

## 2024-01-30 NOTE — Discharge Summary (Signed)
 Physician Discharge Summary  [name] FMW:[id number] DOB: [birth year] DOA: 01/27/2024  PCP: Rexanne Ingle, MD  Admit date: 01/27/2024 Discharge date: 01/30/2024  Admitted From: home Disposition: home Recommendations for Outpatient Follow-up:  Follow up with PCP in 1-2 weeks Please obtain BMP/CBC in one week  Home Health:no Equipment/Devices none Discharge Condition:stable CODE STATUS:dnr Diet recommendation: regular Brief/Interim Summary:  89 y.o. female with medical history significant for HTN, CAD, HFrEF, pulmonary hypertension, HLD, DJD, CKD stage IIIA, vertigo, GERD and IDA who presented to the ED for evaluation of rectal bleeding. Patient reports that recently, she has felt increased fatigue. Today, she noticed bleeding from her rectum with associated large blood clots. She also reports some dizziness and generalized weakness but denies any rectal pain, abdominal pain, nausea, vomiting, syncope, shortness of breath, hematuria or hematemesis.   ED Course: Initial vitals show patient afebrile and normotensive. Initial labs significant for WBC 11.2, Hgb 9.4, K+ 3.4, positive FOBT and normal renal function. CT A/P shows a large hiatal hernia but no acute intra-abdominal abnormality. GI (Dr. Kriss) was consulted for evaluation. [facility] was consulted for admission.   Discharge Diagnoses:  Principal Problem:   GI bleed Active Problems:   Essential hypertension   HFrEF (heart failure with reduced ejection fraction) (HCC)   Mood disorder   Gastroesophageal reflux disease    # Rectal bleeding?  Patient admitted with rectal bleeding with clots initially.  She did not require any blood transfusion as her hemoglobin remained about 8 at all times.  This was thought to be likely related to her diverticular bleed versus a hemorrhoid bleed.  GI saw the patient and recommended conservative management.  She received iron  transfusion during the hospital stay.  She also received IV fluids.   Her hemoglobin was 9.4 on admission and came down to 7.8 with hydration and some bleeding she had initially.  On the day of discharge her hemoglobin was 8.5. She was FOBT positive she tolerated a diet and ambulated prior to discharge.   # Hypokalemia continue potassium supplementation.  She was on as needed Lasix  this was stopped due to soft blood pressure.    # HTN continue Imdur  and Toprol  Lasix  and amlodipine  was stopped due to soft BP on discharge. # HFrEF # Pulmonary hypertension Continue Toprol  and Imdur .   # CKD 3A - Creatinine remains within normal limits at 0.85 with normal BUN   # GERD - Continue Protonix    # Mood disorder - Continue Paxil   Estimated body mass index is 17.27 kg/m as calculated from the following:   Height as of 08/30/23: 5' 6 (1.676 m).   Weight as of 08/30/23: 48.5 kg.  Discharge Instructions  Discharge Instructions     Amb Referral to Intravenous Iron  Therapy   Complete by: As directed    You have been referred to [facility] Infusion team for IV Iron  Infusions. The infusion pharmacy team will reach out to you with appointment information.    Primary Diagnosis Code for IV Iron : D50.9 - Iron  deficiency Anemia   Secondary diagnosis code for IV iron : Other   Comment: Anemia of chronic disease   Increase activity slowly   Complete by: As directed       Allergies as of 01/30/2024       Reactions   Ace Inhibitors Hives, Itching   Streptomycin Itching, Other (See Comments)   Pruritus   Tape Other (See Comments)   SKIN IS VERY DELICATE- BRUISES AND TEARS VERY EASILY!!   Tramadol  Other (See Comments)   Unknown reaction   Vesicare [solifenacin Succinate] Itching   Penicillins Itching, Rash, Other (See Comments)   Pruritic rash        Medication List     STOP taking these medications    amLODipine  2.5 MG tablet Commonly known as: NORVASC    Campho Phenique Maximum St 10.8-4.7 % Liqd Generic drug: camphor-phenol   furosemide  20 MG  tablet Commonly known as: LASIX    lidocaine  4 %   lidocaine  5 % Commonly known as: LIDODERM    magnesium  oxide 400 (240 Mg) MG tablet Commonly known as: MAG-OX       TAKE these medications    acetaminophen  325 MG tablet Commonly known as: TYLENOL  Take 650 mg by mouth every 6 (six) hours as needed for mild pain (pain score 1-3) or moderate pain (pain score 4-6).   bisacodyl 5 MG EC tablet Commonly known as: DULCOLAX Take 1 tablet (5 mg total) by mouth daily as needed for moderate constipation.   ferrous sulfate 325 (65 FE) MG tablet Take 325 mg by mouth daily with breakfast.   hydrocortisone  25 MG suppository Commonly known as: ANUSOL -HC Place 1 suppository (25 mg total) rectally 2 (two) times daily.   isosorbide  mononitrate 30 MG 24 hr tablet Commonly known as: IMDUR  Take 0.5 tablets (15 mg total) by mouth daily.   loperamide  2 MG capsule Commonly known as: IMODIUM  Take 2 mg by mouth as needed for diarrhea or loose stools.   meclizine  25 MG tablet Commonly known as: ANTIVERT  Take 25 mg by mouth 2 (two) times daily as needed for dizziness.   metoprolol  succinate 25 MG 24 hr tablet Commonly known as: TOPROL -XL Take 0.5 tablets (12.5 mg total) by mouth daily.   pantoprazole  40 MG tablet Commonly known as: PROTONIX  Take 40 mg by mouth every Monday, Wednesday, and Friday.   PARoxetine  10 MG tablet Commonly known as: PAXIL  Take 10 mg by mouth daily.   Pepto Bismol 525 MG/30ML suspension Generic drug: bismuth subsalicylate Take 30 mLs by mouth every 6 (six) hours as needed for diarrhea or loose stools.   polyethylene glycol 17 g packet Commonly known as: MiraLax  Take 17 g by mouth daily as needed for mild constipation.   potassium chloride  10 MEQ tablet Commonly known as: KLOR-CON  Take 1 tablet (10 mEq total) by mouth daily. What changed: how much to take   Systane Ultra 0.4-0.3 % Soln Generic drug: Polyethyl Glycol-Propyl Glycol Place 1 drop into both  eyes in the morning and at bedtime.        Follow-up Information     Rexanne Ingle, MD Follow up.   Specialty: Internal Medicine Contact information: [address] [place] [address] [phone]                Allergies[1]  Consultations: gi   Procedures/Studies: CT ABDOMEN PELVIS WO CONTRAST Result Date: 01/27/2024 CLINICAL DATA:  Bright red blood per rectum EXAM: CT ABDOMEN AND PELVIS WITHOUT CONTRAST TECHNIQUE: Multidetector CT imaging of the abdomen and pelvis was performed following the standard protocol without IV contrast. RADIATION DOSE REDUCTION: This exam was performed according to the departmental dose-optimization program which includes automated exposure control, adjustment of the mA and/or kV according to patient size and/or use of iterative reconstruction technique. COMPARISON:  CT abdomen and pelvis dated 08/30/2023 FINDINGS: Lower chest: Bilateral lower lobe atelectasis. No pleural effusion or pneumothorax demonstrated. Partially imaged heart size is normal. Hepatobiliary: No focal hepatic lesions. No intra  or extrahepatic biliary ductal dilation. Normal gallbladder. Pancreas: No focal lesions or main ductal dilation. Spleen: Normal in size without focal abnormality. Adrenals/Urinary Tract: No adrenal nodules. No suspicious renal mass, calculi or hydronephrosis. No focal bladder wall thickening. Stomach/Bowel: Partially imaged large hiatal hernia. No evidence of bowel wall thickening, distention, or inflammatory changes. Extensive colonic diverticulosis without acute diverticulitis. Appendix is not discretely seen. Vascular/Lymphatic: Aortic atherosclerosis. No enlarged abdominal or pelvic lymph nodes. Reproductive: No adnexal masses. Other: No free fluid, fluid collection, or free air. Musculoskeletal: No acute or abnormal lytic or blastic osseous lesions. Multilevel degenerative changes of the partially imaged thoracic and lumbar spine. Unchanged  ankylosis of T12-L2. IMPRESSION: 1. Extensive colonic diverticulosis without acute diverticulitis. 2. Partially imaged large hiatal hernia. 3.  Aortic Atherosclerosis (ICD10-I70.0). Electronically Signed   By: Limin  Xu M.D.   On: 01/27/2024 17:43   (Echo, Carotid, EGD, Colonoscopy, ERCP)    Subjective:  Resting in bed ate breakfast no bowel movements of bleeding overnight Discharge Exam: Vitals:   01/29/24 1619 01/30/24 1048  BP: (!) 130/53 (!) 157/93  Pulse:  81  Resp:    Temp:    SpO2:     Vitals:   01/29/24 0623 01/29/24 1407 01/29/24 1619 01/30/24 1048  BP: 138/69 (!) 142/114 (!) 130/53 (!) 157/93  Pulse: 81 83  81  Resp: 16     Temp: 98.2 F (36.8 C)     TempSrc:      SpO2: 98% 100%      General: Pt is alert, awake, not in acute distress Cardiovascular: RRR, S1/S2 +, no rubs, no gallops Respiratory: CTA bilaterally, no wheezing, no rhonchi Abdominal: Soft, NT, ND, bowel sounds + Extremities: no edema, no cyanosis    The results of significant diagnostics from this hospitalization (including imaging, microbiology, ancillary and laboratory) are listed below for reference.     Microbiology: Recent Results (from the past 240 hours)  Gastrointestinal Panel by PCR , Stool     Status: None   Collection Time: 01/27/24  5:00 PM   Specimen: Stool  Result Value Ref Range Status   Campylobacter species NOT DETECTED NOT DETECTED Final   Plesimonas shigelloides NOT DETECTED NOT DETECTED Final   Salmonella species NOT DETECTED NOT DETECTED Final   Yersinia enterocolitica NOT DETECTED NOT DETECTED Final   Vibrio species NOT DETECTED NOT DETECTED Final   Vibrio cholerae NOT DETECTED NOT DETECTED Final   Enteroaggregative E coli (EAEC) NOT DETECTED NOT DETECTED Final   Enteropathogenic E coli (EPEC) NOT DETECTED NOT DETECTED Final   Enterotoxigenic E coli (ETEC) NOT DETECTED NOT DETECTED Final   Shiga like toxin producing E coli (STEC) NOT DETECTED NOT DETECTED Final    Shigella/Enteroinvasive E coli (EIEC) NOT DETECTED NOT DETECTED Final   Cryptosporidium NOT DETECTED NOT DETECTED Final   Cyclospora cayetanensis NOT DETECTED NOT DETECTED Final   Entamoeba histolytica NOT DETECTED NOT DETECTED Final   Giardia lamblia NOT DETECTED NOT DETECTED Final   Adenovirus F40/41 NOT DETECTED NOT DETECTED Final   Astrovirus NOT DETECTED NOT DETECTED Final   Norovirus GI/GII NOT DETECTED NOT DETECTED Final   Rotavirus A NOT DETECTED NOT DETECTED Final   Sapovirus (I, II, IV, and V) NOT DETECTED NOT DETECTED Final    Comment: Performed at [facility], [address] [place], [place] [address]     Labs: BNP (last 3 results) No results for input(s): BNP in the last 8760 hours. Basic Metabolic Panel: Recent Labs  Lab 01/27/24  1817 01/28/24 0748 01/29/24 0623 01/30/24 1025  NA 145 143 141 144  K 3.4* 3.5 3.2* 3.3*  CL 109 111 107 112*  CO2 27 18* 24 22  GLUCOSE 125* 79 81 81  BUN 13 16 8  7*  CREATININE 0.85 0.75 0.70 0.68  CALCIUM 8.8* 8.7* 8.8* 8.7*  MG  --  2.2  --   --    Liver Function Tests: Recent Labs  Lab 01/27/24 1817 01/29/24 0623  AST 16 15  ALT <5 <5  ALKPHOS 66 61  BILITOT 0.4 0.4  PROT 7.2 6.6  ALBUMIN 3.6 3.3*   No results for input(s): LIPASE, AMYLASE in the last 168 hours. No results for input(s): AMMONIA in the last 168 hours. CBC: Recent Labs  Lab 01/27/24 1817 01/28/24 0748 01/29/24 0623 01/30/24 1025  WBC 11.2* 9.0 5.8 6.8  NEUTROABS 8.9*  --   --   --   HGB 9.4* 9.1* 7.8* 8.5*  HCT 30.5* 28.9* 24.0* 27.7*  MCV 93.6 93.5 91.3 91.7  PLT 168 152 145* 162   Cardiac Enzymes: No results for input(s): CKTOTAL, CKMB, CKMBINDEX, TROPONINI in the last 168 hours. BNP: Invalid input(s): POCBNP CBG: No results for input(s): GLUCAP in the last 168 hours. D-Dimer No results for input(s): DDIMER in the last 72 hours. Hgb A1c No results for input(s): HGBA1C in the last 72 hours. Lipid  Profile No results for input(s): CHOL, HDL, LDLCALC, TRIG, CHOLHDL, LDLDIRECT in the last 72 hours. Thyroid  function studies No results for input(s): TSH, T4TOTAL, T3FREE, THYROIDAB in the last 72 hours.  Invalid input(s): FREET3 Anemia work up Recent Labs    01/28/24 0748  VITAMINB12 525  FERRITIN 15  TIBC 301  IRON  27*   Urinalysis    Component Value Date/Time   COLORURINE YELLOW 08/30/2023 1303   APPEARANCEUR HAZY (A) 08/30/2023 1303   LABSPEC 1.017 08/30/2023 1303   PHURINE 5.0 08/30/2023 1303   GLUCOSEU NEGATIVE 08/30/2023 1303   HGBUR MODERATE (A) 08/30/2023 1303   BILIRUBINUR NEGATIVE 08/30/2023 1303   KETONESUR NEGATIVE 08/30/2023 1303   PROTEINUR 100 (A) 08/30/2023 1303   UROBILINOGEN 0.2 09/18/2014 1600   NITRITE NEGATIVE 08/30/2023 1303   LEUKOCYTESUR SMALL (A) 08/30/2023 1303   Sepsis Labs Recent Labs  Lab 01/27/24 1817 01/28/24 0748 01/29/24 0623 01/30/24 1025  WBC 11.2* 9.0 5.8 6.8   Microbiology Recent Results (from the past 240 hours)  Gastrointestinal Panel by PCR , Stool     Status: None   Collection Time: 01/27/24  5:00 PM   Specimen: Stool  Result Value Ref Range Status   Campylobacter species NOT DETECTED NOT DETECTED Final   Plesimonas shigelloides NOT DETECTED NOT DETECTED Final   Salmonella species NOT DETECTED NOT DETECTED Final   Yersinia enterocolitica NOT DETECTED NOT DETECTED Final   Vibrio species NOT DETECTED NOT DETECTED Final   Vibrio cholerae NOT DETECTED NOT DETECTED Final   Enteroaggregative E coli (EAEC) NOT DETECTED NOT DETECTED Final   Enteropathogenic E coli (EPEC) NOT DETECTED NOT DETECTED Final   Enterotoxigenic E coli (ETEC) NOT DETECTED NOT DETECTED Final   Shiga like toxin producing E coli (STEC) NOT DETECTED NOT DETECTED Final   Shigella/Enteroinvasive E coli (EIEC) NOT DETECTED NOT DETECTED Final   Cryptosporidium NOT DETECTED NOT DETECTED Final   Cyclospora cayetanensis NOT DETECTED NOT  DETECTED Final   Entamoeba histolytica NOT DETECTED NOT DETECTED Final   Giardia lamblia NOT DETECTED NOT DETECTED Final   Adenovirus F40/41 NOT DETECTED NOT  DETECTED Final   Astrovirus NOT DETECTED NOT DETECTED Final   Norovirus GI/GII NOT DETECTED NOT DETECTED Final   Rotavirus A NOT DETECTED NOT DETECTED Final   Sapovirus (I, II, IV, and V) NOT DETECTED NOT DETECTED Final    Comment: Performed at [facility], [address] [place], [place] [address]     Time coordinating discharge: 40 min SIGNED:   Almarie KANDICE Hoots, MD  [facility] 01/30/2024, 2:35 PM     [1]  Allergies Allergen Reactions   Ace Inhibitors Hives and Itching   Streptomycin Itching and Other (See Comments)    Pruritus    Tape Other (See Comments)    SKIN IS VERY DELICATE- BRUISES AND TEARS VERY EASILY!!   Tramadol Other (See Comments)    Unknown reaction   Vesicare [Solifenacin Succinate] Itching   Penicillins Itching, Rash and Other (See Comments)    Pruritic rash

## 2024-02-02 ENCOUNTER — Telehealth (HOSPITAL_COMMUNITY): Payer: Self-pay

## 2024-02-02 NOTE — Telephone Encounter (Signed)
 Auth Submission: NO AUTH NEEDED Site of care: Site of care: [facility] Payer: Medicare A/B, BCBS Supplement Medication & CPT/J Code(s) submitted: Feraheme (ferumoxytol) [id number] Diagnosis Code: N18.30, K62.5 Route of submission (phone, fax, portal):  Phone # Fax # Auth type: Buy/Bill HB Units/visits requested: 510mg  x 1 dose Reference number:  Approval from: 02/02/24 to 05/02/24

## 2024-02-10 ENCOUNTER — Emergency Department (HOSPITAL_COMMUNITY)

## 2024-02-10 ENCOUNTER — Ambulatory Visit (HOSPITAL_COMMUNITY)
Admission: RE | Admit: 2024-02-10 | Discharge: 2024-02-10 | Disposition: A | Source: Ambulatory Visit | Attending: Internal Medicine

## 2024-02-10 ENCOUNTER — Emergency Department (HOSPITAL_COMMUNITY)
Admission: EM | Admit: 2024-02-10 | Discharge: 2024-02-10 | Disposition: A | Discharge status: Home / Self Care | Attending: Emergency Medicine | Admitting: Emergency Medicine

## 2024-02-10 ENCOUNTER — Ambulatory Visit: Payer: Self-pay

## 2024-02-10 VITALS — BP 145/58 | HR 67 | Temp 97.4°F | Resp 16

## 2024-02-10 DIAGNOSIS — N183 Chronic kidney disease, stage 3 unspecified: Secondary | ICD-10-CM | POA: Insufficient documentation

## 2024-02-10 DIAGNOSIS — K625 Hemorrhage of anus and rectum: Secondary | ICD-10-CM | POA: Insufficient documentation

## 2024-02-10 DIAGNOSIS — R509 Fever, unspecified: Secondary | ICD-10-CM | POA: Insufficient documentation

## 2024-02-10 DIAGNOSIS — N189 Chronic kidney disease, unspecified: Secondary | ICD-10-CM | POA: Insufficient documentation

## 2024-02-10 DIAGNOSIS — E876 Hypokalemia: Secondary | ICD-10-CM | POA: Insufficient documentation

## 2024-02-10 LAB — URINALYSIS, W/ REFLEX TO CULTURE (INFECTION SUSPECTED)
Bacteria, UA: NONE SEEN
Bilirubin Urine: NEGATIVE
Glucose, UA: NEGATIVE mg/dL
Ketones, ur: NEGATIVE mg/dL
Leukocytes,Ua: NEGATIVE
Nitrite: NEGATIVE
Protein, ur: 30 mg/dL — AB
Specific Gravity, Urine: 1.015 (ref 1.005–1.030)
pH: 5 (ref 5.0–8.0)

## 2024-02-10 LAB — CBC WITH DIFFERENTIAL/PLATELET
Abs Immature Granulocytes: 0.08 K/uL — ABNORMAL HIGH (ref 0.00–0.07)
Basophils Absolute: 0 K/uL (ref 0.0–0.1)
Basophils Relative: 0 %
Eosinophils Absolute: 0 K/uL (ref 0.0–0.5)
Eosinophils Relative: 0 %
HCT: 28.5 % — ABNORMAL LOW (ref 36.0–46.0)
Hemoglobin: 8.8 g/dL — ABNORMAL LOW (ref 12.0–15.0)
Immature Granulocytes: 1 %
Lymphocytes Relative: 5 %
Lymphs Abs: 0.6 K/uL — ABNORMAL LOW (ref 0.7–4.0)
MCH: 29.3 pg (ref 26.0–34.0)
MCHC: 30.9 g/dL (ref 30.0–36.0)
MCV: 95 fL (ref 80.0–100.0)
Monocytes Absolute: 1.2 K/uL — ABNORMAL HIGH (ref 0.1–1.0)
Monocytes Relative: 11 %
Neutro Abs: 9 K/uL — ABNORMAL HIGH (ref 1.7–7.7)
Neutrophils Relative %: 83 %
Platelets: 157 K/uL (ref 150–400)
RBC: 3 MIL/uL — ABNORMAL LOW (ref 3.87–5.11)
RDW: 17.4 % — ABNORMAL HIGH (ref 11.5–15.5)
WBC: 10.8 K/uL — ABNORMAL HIGH (ref 4.0–10.5)
nRBC: 0 % (ref 0.0–0.2)

## 2024-02-10 LAB — COMPREHENSIVE METABOLIC PANEL WITH GFR
ALT: <5 U/L (ref 0–44)
AST: 16 U/L (ref 15–41)
Albumin: 3.5 g/dL (ref 3.5–5.0)
Alkaline Phosphatase: 58 U/L (ref 38–126)
Anion gap: 11 (ref 5–15)
BUN: 10 mg/dL (ref 8–23)
CO2: 22 mmol/L (ref 22–32)
Calcium: 9 mg/dL (ref 8.9–10.3)
Chloride: 107 mmol/L (ref 98–111)
Creatinine, Ser: 0.7 mg/dL (ref 0.44–1.00)
GFR, Estimated: >60 mL/min
Glucose, Bld: 136 mg/dL — ABNORMAL HIGH (ref 70–99)
Potassium: 2.8 mmol/L — ABNORMAL LOW (ref 3.5–5.1)
Sodium: 141 mmol/L (ref 135–145)
Total Bilirubin: 0.4 mg/dL (ref 0.0–1.2)
Total Protein: 6.9 g/dL (ref 6.5–8.1)

## 2024-02-10 LAB — RESP PANEL BY RT-PCR (RSV, FLU A&B, COVID)  RVPGX2
Influenza A by PCR: NEGATIVE
Influenza B by PCR: NEGATIVE
Resp Syncytial Virus by PCR: NEGATIVE
SARS Coronavirus 2 by RT PCR: NEGATIVE

## 2024-02-10 MED ORDER — SODIUM CHLORIDE 0.9 % IV SOLN: INTRAVENOUS | Status: DC | PRN

## 2024-02-10 MED ORDER — POTASSIUM CHLORIDE CRYS ER 20 MEQ PO TBCR
40.0000 meq | EXTENDED_RELEASE_TABLET | Freq: Once | ORAL | Status: AC
  Administered 2024-02-10: 40 meq via ORAL
  Filled 2024-02-10: qty 2

## 2024-02-10 MED ORDER — SODIUM CHLORIDE 0.9 % IV SOLN
510.0000 mg | Freq: Once | INTRAVENOUS | Status: AC
  Administered 2024-02-10: 510 mg via INTRAVENOUS
  Filled 2024-02-10: qty 17

## 2024-02-10 MED ORDER — ACETAMINOPHEN 500 MG PO TABS
1000.0000 mg | ORAL_TABLET | Freq: Once | ORAL | Status: AC
  Administered 2024-02-10: 1000 mg via ORAL
  Filled 2024-02-10: qty 2

## 2024-02-10 NOTE — Progress Notes (Signed)
 Diagnosis:Iron  deficiency anemia    Provider: Will Norris    Procedure: Feraheme 510 mg     Note: Patient received Feraheme 510 mg infusion via PIV (1 of 1).  Patient tolerated well with no adverse reaction.  Vitals signs remained stable.  Patient and caregiver niece given written and verbal discharge instructions.  Patient alert, oriented and in her own wheelchair upon discharge.

## 2024-02-10 NOTE — ED Triage Notes (Signed)
 Pt BIB EMS due to receiving iron  infusion - iron  infusion suppose to run for 4 hours and infusion ran for 15 minutes. Post 15 minute infusion presented with fever and chills.

## 2024-02-10 NOTE — Discharge Instructions (Signed)
 You were seen in the emerged department for fever There was no evidence of urinary tract infection COVID flu RSV or pneumonia We are not sure what is causing your fever but it may be related to a reaction to the iron  transfusion Your potassium was a bit low and will need to be checked by your primary doctor Return to the Emergency Department for recurrent fevers or signs of infection trouble breathing or other concerns Otherwise follow-up with your primary doctor and continue taking all previously prescribed indications.

## 2024-02-10 NOTE — Telephone Encounter (Signed)
" °  FYI Only or Action Required?: FYI only for provider: ED advised.  Patient was last seen in primary care on n/a.  Called Nurse Triage reporting Allergic Reaction.  Symptoms began today.  Interventions attempted: Nothing.  Symptoms are: unchanged.  Triage Disposition: Call EMS 911 Now  Patient/caregiver understands and will follow disposition?: Yes   Copied from CRM #[id number]. Topic: Clinical - Red Word Triage >> Feb 10, 2024  4:46 PM Tinnie BROCKS wrote: Red Word that prompted transfer to Nurse Triage: Heron (niece and caregiver, not on DPR) calling regarding patient. She received 225 mg iron  today via infusion and is now unable to get warm, using several blankets. Reason for Disposition  Other symptom of severe allergic reaction  (Exception: Hives or face swelling alone.)  Answer Assessment - Initial Assessment Questions Pts niece called, pt left hospital after having a iron  infusion about 1230 today. Pt is now having elevated BP, elevated HR 128 and feels jittery. Rn advised pt should go to the ER. Pt's niece states she'll call 911 to get her. Denies any swelling, shortness of breath. Due to  ER dispo, not all triage questions answered.     1. SYMPTOMS: What is the most serious symptom?     Tachycardia, jittery 2. AIRWAY: Are they breathing? (e.g., Yes, No)  (R/O: airway blockage, respiratory arrest)      Breathing ok, following commands 3. BREATHING: Is there difficulty breathing? (e.g., Yes, No; wheezing, unable to complete a sentence)  (R/O: respiratory distress)     denies 4. CIRCULATION: Are you feeling weak?  (e.g., Yes, No, Unknown; severity)  (R/O: shock) If Yes, ask: Can you stand and walk normally?      5. SWALLOWING: Can you swallow? (e.g., Yes, No; food, fluid, saliva)      yes 6. ONSET: When did the reaction start? (e.g., minutes, hours ago)       7. SUBSTANCE: What are you reacting to? When did the contact occur?      Iron  infusion 8. PREVIOUS  REACTION: Have you ever reacted to it before? If Yes, ask: What happened that time?      9. EPINEPHRINE: Do you have an epinephrine autoinjector (e.g., EpiPen)?  Protocols used: Anaphylaxis-A-AH  "

## 2024-02-10 NOTE — ED Provider Notes (Signed)
 " [facility] AT [facility] Provider Note   CSN: [id number] Arrival date & time: 02/10/24  1755     Patient presents with: Fever   [name] is a 89 y.o. female.  With a history of iron  deficiency anemia, CKD and recurrent UTIs who presents to the ED for fever.  Patient underwent scheduled iron  transfusion today and reportedly tolerated this well with no reaction completing the transfusion (510 mg Feraheme) over about 4-1/2 hours.  She went home under the care of her daughter and reported chills and had trouble getting warm even with extra blankets.  Daughter noted her to be febrile at that time.  There was no shortness of breath vomiting diarrhea or other overt infectious symptoms.  She spoke with the on-call nurse and was directed here for further evaluation    Fever      Prior to Admission medications  Medication Sig Start Date End Date Taking? Authorizing Provider  acetaminophen  (TYLENOL ) 325 MG tablet Take 650 mg by mouth every 6 (six) hours as needed for mild pain (pain score 1-3) or moderate pain (pain score 4-6).    [provider]  bisacodyl  (DULCOLAX) 5 MG EC tablet Take 1 tablet (5 mg total) by mouth daily as needed for moderate constipation. 01/30/24   Will Almarie MATSU, MD  ferrous sulfate  325 (65 FE) MG tablet Take 325 mg by mouth daily with breakfast.    [provider]  hydrocortisone  (ANUSOL -HC) 25 MG suppository Place 1 suppository (25 mg total) rectally 2 (two) times daily. 01/30/24   Will Almarie MATSU, MD  isosorbide  mononitrate (IMDUR ) 30 MG 24 hr tablet Take 0.5 tablets (15 mg total) by mouth daily. 01/30/24   Will Almarie MATSU, MD  meclizine  (ANTIVERT ) 25 MG tablet Take 25 mg by mouth 2 (two) times daily as needed for dizziness. 09/28/21   [provider]  metoprolol  succinate (TOPROL -XL) 25 MG 24 hr tablet Take 0.5 tablets (12.5 mg total) by mouth daily. 11/26/21   Jerri Keys, MD  pantoprazole   (PROTONIX ) 40 MG tablet Take 40 mg by mouth every Monday, Wednesday, and Friday.    [provider]  PARoxetine  (PAXIL ) 10 MG tablet Take 10 mg by mouth daily. 03/10/20   [provider]  PEPTO BISMOL 525 MG/30ML suspension Take 30 mLs by mouth every 6 (six) hours as needed for diarrhea or loose stools.    [provider]  polyethylene glycol (MIRALAX ) 17 g packet Take 17 g by mouth 2 (two) times daily. 01/30/24   Will Almarie MATSU, MD  potassium chloride  (KLOR-CON ) 10 MEQ tablet Take 1 tablet (10 mEq total) by mouth daily. 01/30/24   Will Almarie MATSU, MD  senna-docusate (SENOKOT-S) 8.6-50 MG tablet Take 1 tablet by mouth at bedtime. 01/30/24   Will Almarie MATSU, MD  SYSTANE ULTRA 0.4-0.3 % SOLN Place 1 drop into both eyes in the morning and at bedtime.    [provider]    Allergies: Ace inhibitors, Streptomycin, Tape, Tramadol, Vesicare [solifenacin succinate], and Penicillins    Review of Systems  Constitutional:  Positive for fever.    Updated Vital Signs BP (!) 100/46   Pulse 80   Temp 99.6 F (37.6 C) (Oral)   Resp 20   SpO2 96%   Physical Exam Vitals and nursing note reviewed.  HENT:     Head: Normocephalic and atraumatic.  Eyes:     Pupils: Pupils are equal, round, and reactive to light.  Cardiovascular:  Rate and Rhythm: Normal rate and regular rhythm.  Pulmonary:     Effort: Pulmonary effort is normal.     Breath sounds: Normal breath sounds.  Abdominal:     Palpations: Abdomen is soft.     Tenderness: There is no abdominal tenderness.  Skin:    General: Skin is warm and dry.  Neurological:     Mental Status: She is alert.  Psychiatric:        Mood and Affect: Mood normal.     (all labs ordered are listed, but only abnormal results are displayed) Labs Reviewed  COMPREHENSIVE METABOLIC PANEL WITH GFR - Abnormal; Notable for the following components:      Result Value   Potassium 2.8 (*)    Glucose, Bld 136 (*)     All other components within normal limits  CBC WITH DIFFERENTIAL/PLATELET - Abnormal; Notable for the following components:   WBC 10.8 (*)    RBC 3.00 (*)    Hemoglobin 8.8 (*)    HCT 28.5 (*)    RDW 17.4 (*)    Neutro Abs 9.0 (*)    Lymphs Abs 0.6 (*)    Monocytes Absolute 1.2 (*)    Abs Immature Granulocytes 0.08 (*)    All other components within normal limits  URINALYSIS, W/ REFLEX TO CULTURE (INFECTION SUSPECTED) - Abnormal; Notable for the following components:   Hgb urine dipstick SMALL (*)    Protein, ur 30 (*)    All other components within normal limits  RESP PANEL BY RT-PCR (RSV, FLU A&B, COVID)  [id number]    EKG: EKG Interpretation Date/Time:  Tuesday February 10 2024 18:21:21 EST Ventricular Rate:  94 PR Interval:  197 QRS Duration:  86 QT Interval:  416 QTC Calculation: 521 R Axis:   -48  Text Interpretation: Sinus rhythm LVH with secondary repolarization abnormality Inferior infarct, old Anterior infarct, old Prolonged QT interval Confirmed by Pamella Sharper [phone]) on 02/10/2024 11:32:03 PM  Radiology: [facility] Chest Portable 1 View Result Date: 02/10/2024 EXAM: 1 VIEW(S) XRAY OF THE CHEST 02/10/2024 07:36:00 PM COMPARISON: Chest x-ray 08/30/2023, CT Abdomen and Pelvis 01/27/2024. CLINICAL HISTORY: Pneumonia. ICD10: J18.9 - Pneumonia, unspecified organism. FINDINGS: LUNGS AND PLEURA: Chronic left costophrenic angle blunting. No definite pleural effusion. No pneumothorax. No focal pulmonary opacity. HEART AND MEDIASTINUM: Hiatal hernia. Atherosclerotic plaque. No acute abnormality of the cardiac and mediastinal silhouettes. BONES AND SOFT TISSUES: No acute osseous abnormality. IMPRESSION: 1. No acute cardiopulmonary abnormality. 2. Hiatal hernia. Electronically signed by: Morgane Naveau MD 02/10/2024 07:40 PM EST RP Workstation: [id number]     Procedures   Medications Ordered in the ED  acetaminophen  (TYLENOL ) tablet 1,000 mg (1,000 mg Oral Given 02/10/24 1854)  potassium  chloride SA (KLOR-CON  M) CR tablet 40 mEq (40 mEq Oral Given 02/10/24 2326)    Clinical Course as of 02/10/24 2332  Tue Feb 10, 2024  2331 Laboratory workup notable for hypokalemia of 2.8.  Will provide oral repletion and instruct her to have this retaken by her primary doctor.  No significant leukocytosis UA negative chest x-ray negative COVID flu RSV all negative.  Patient has remained hemodynamically stable feels well she is now afebrile appropriate for discharge. [MP]    Clinical Course User Index [MP] Pamella Sharper LABOR, DO                                 Medical Decision Making 89 year old female presenting for  fever shortly after undergoing iron  transfusion earlier this morning.  No other overt symptoms.  Hemodynamically stable awake alert oriented on my exam.  No adventitious lung sounds abdominal tenderness.  Fever may be due to acute viral illness, acute infectious process such as pneumonia or UTI or could also be transfusion reaction.  Will give Tylenol  obtain laboratory workup chest x-ray urine test and continue to monitor fever and hemodynamic status.  Amount and/or Complexity of Data Reviewed Labs: ordered. Radiology: ordered.  Risk OTC drugs. Prescription drug management.        Final diagnoses:  Fever, unspecified fever cause    ED Discharge Orders     None          Pamella Ozell LABOR, DO 02/10/24 2332  "

## 2024-02-12 NOTE — Progress Notes (Signed)
 [name]
